# Patient Record
Sex: Female | Born: 1955
Health system: Southern US, Community
[De-identification: ages and names within clinical notes are randomized; demographics above are authoritative.]

## PROBLEM LIST (undated history)

## (undated) DIAGNOSIS — E119 Type 2 diabetes mellitus without complications: Secondary | ICD-10-CM

## (undated) DIAGNOSIS — L97512 Non-pressure chronic ulcer of other part of right foot with fat layer exposed: Secondary | ICD-10-CM

## (undated) DIAGNOSIS — G709 Myoneural disorder, unspecified: Secondary | ICD-10-CM

## (undated) DIAGNOSIS — Z8489 Family history of other specified conditions: Secondary | ICD-10-CM

## (undated) DIAGNOSIS — M19041 Primary osteoarthritis, right hand: Secondary | ICD-10-CM

## (undated) DIAGNOSIS — I209 Angina pectoris, unspecified: Secondary | ICD-10-CM

## (undated) DIAGNOSIS — K219 Gastro-esophageal reflux disease without esophagitis: Secondary | ICD-10-CM

## (undated) DIAGNOSIS — M19042 Primary osteoarthritis, left hand: Secondary | ICD-10-CM

## (undated) DIAGNOSIS — I739 Peripheral vascular disease, unspecified: Secondary | ICD-10-CM

## (undated) DIAGNOSIS — M1990 Unspecified osteoarthritis, unspecified site: Secondary | ICD-10-CM

## (undated) DIAGNOSIS — Z9289 Personal history of other medical treatment: Secondary | ICD-10-CM

## (undated) DIAGNOSIS — M869 Osteomyelitis, unspecified: Secondary | ICD-10-CM

## (undated) DIAGNOSIS — Z7902 Long term (current) use of antithrombotics/antiplatelets: Secondary | ICD-10-CM

## (undated) DIAGNOSIS — G4730 Sleep apnea, unspecified: Secondary | ICD-10-CM

## (undated) DIAGNOSIS — I10 Essential (primary) hypertension: Secondary | ICD-10-CM

## (undated) DIAGNOSIS — G629 Polyneuropathy, unspecified: Secondary | ICD-10-CM

## (undated) DIAGNOSIS — T7840XA Allergy, unspecified, initial encounter: Secondary | ICD-10-CM

## (undated) DIAGNOSIS — E7800 Pure hypercholesterolemia, unspecified: Secondary | ICD-10-CM

## (undated) DIAGNOSIS — H269 Unspecified cataract: Secondary | ICD-10-CM

## (undated) DIAGNOSIS — E876 Hypokalemia: Secondary | ICD-10-CM

## (undated) DIAGNOSIS — J45909 Unspecified asthma, uncomplicated: Secondary | ICD-10-CM

## (undated) HISTORY — DX: Unspecified cataract: H26.9

## (undated) HISTORY — PX: NASAL SINUS SURGERY: SHX719

## (undated) HISTORY — PX: UTERINE FIBROID SURGERY: SHX826

## (undated) HISTORY — PX: ABDOMINAL HYSTERECTOMY: SHX81

## (undated) HISTORY — DX: Allergy, unspecified, initial encounter: T78.40XA

## (undated) HISTORY — DX: Personal history of other medical treatment: Z92.89

## (undated) HISTORY — DX: Peripheral vascular disease, unspecified: I73.9

## (undated) HISTORY — DX: Myoneural disorder, unspecified: G70.9

---

## 1984-07-02 HISTORY — PX: BACK SURGERY: SHX140

## 1999-12-29 DIAGNOSIS — I10 Essential (primary) hypertension: Secondary | ICD-10-CM | POA: Insufficient documentation

## 2000-01-23 ENCOUNTER — Other Ambulatory Visit: Admission: RE | Admit: 2000-01-23 | Discharge: 2000-01-23 | Payer: Self-pay | Admitting: Obstetrics and Gynecology

## 2000-03-13 ENCOUNTER — Encounter: Payer: Self-pay | Admitting: Obstetrics and Gynecology

## 2000-03-13 ENCOUNTER — Ambulatory Visit (HOSPITAL_COMMUNITY): Admission: RE | Admit: 2000-03-13 | Discharge: 2000-03-13 | Payer: Self-pay | Admitting: Obstetrics and Gynecology

## 2000-04-16 ENCOUNTER — Encounter: Payer: Self-pay | Admitting: Obstetrics and Gynecology

## 2000-04-22 ENCOUNTER — Inpatient Hospital Stay (HOSPITAL_COMMUNITY): Admission: RE | Admit: 2000-04-22 | Discharge: 2000-04-25 | Payer: Self-pay | Admitting: Obstetrics and Gynecology

## 2000-05-10 ENCOUNTER — Other Ambulatory Visit: Admission: RE | Admit: 2000-05-10 | Discharge: 2000-05-10 | Payer: Self-pay | Admitting: Obstetrics and Gynecology

## 2005-05-15 ENCOUNTER — Ambulatory Visit: Payer: Self-pay | Admitting: Family Medicine

## 2005-08-07 ENCOUNTER — Ambulatory Visit: Payer: Self-pay | Admitting: Unknown Physician Specialty

## 2006-10-08 DIAGNOSIS — K449 Diaphragmatic hernia without obstruction or gangrene: Secondary | ICD-10-CM | POA: Insufficient documentation

## 2006-10-08 DIAGNOSIS — E119 Type 2 diabetes mellitus without complications: Secondary | ICD-10-CM | POA: Insufficient documentation

## 2006-10-08 DIAGNOSIS — J302 Other seasonal allergic rhinitis: Secondary | ICD-10-CM | POA: Insufficient documentation

## 2006-10-31 ENCOUNTER — Ambulatory Visit: Payer: Self-pay | Admitting: Family Medicine

## 2007-12-26 DIAGNOSIS — G4733 Obstructive sleep apnea (adult) (pediatric): Secondary | ICD-10-CM | POA: Insufficient documentation

## 2008-03-01 DIAGNOSIS — Z803 Family history of malignant neoplasm of breast: Secondary | ICD-10-CM | POA: Insufficient documentation

## 2008-04-27 ENCOUNTER — Ambulatory Visit: Payer: Self-pay | Admitting: Family Medicine

## 2008-08-28 ENCOUNTER — Observation Stay: Payer: Self-pay | Admitting: Internal Medicine

## 2008-10-13 ENCOUNTER — Ambulatory Visit: Payer: Self-pay | Admitting: Gastroenterology

## 2008-11-10 ENCOUNTER — Ambulatory Visit: Payer: Self-pay | Admitting: Family Medicine

## 2009-12-07 ENCOUNTER — Ambulatory Visit: Payer: Self-pay | Admitting: Family Medicine

## 2011-02-13 ENCOUNTER — Ambulatory Visit: Payer: Self-pay | Admitting: Family Medicine

## 2011-06-07 ENCOUNTER — Ambulatory Visit: Payer: Self-pay | Admitting: Family Medicine

## 2012-03-18 ENCOUNTER — Ambulatory Visit: Payer: Self-pay | Admitting: Family Medicine

## 2013-04-20 ENCOUNTER — Ambulatory Visit: Payer: Self-pay | Admitting: Family Medicine

## 2013-12-18 ENCOUNTER — Ambulatory Visit: Payer: Self-pay | Admitting: Neurosurgery

## 2013-12-24 DIAGNOSIS — G629 Polyneuropathy, unspecified: Secondary | ICD-10-CM | POA: Insufficient documentation

## 2013-12-24 DIAGNOSIS — B351 Tinea unguium: Secondary | ICD-10-CM | POA: Insufficient documentation

## 2013-12-24 DIAGNOSIS — I200 Unstable angina: Secondary | ICD-10-CM | POA: Insufficient documentation

## 2013-12-24 DIAGNOSIS — M898X9 Other specified disorders of bone, unspecified site: Secondary | ICD-10-CM | POA: Insufficient documentation

## 2013-12-28 ENCOUNTER — Other Ambulatory Visit: Payer: Self-pay | Admitting: Neurosurgery

## 2013-12-28 DIAGNOSIS — M4310 Spondylolisthesis, site unspecified: Secondary | ICD-10-CM

## 2014-07-15 LAB — HM PAP SMEAR: HM Pap smear: NEGATIVE

## 2014-09-08 ENCOUNTER — Ambulatory Visit: Payer: Self-pay | Admitting: Family Medicine

## 2014-09-14 ENCOUNTER — Ambulatory Visit: Payer: Self-pay | Admitting: Family Medicine

## 2014-12-13 ENCOUNTER — Other Ambulatory Visit: Payer: Self-pay | Admitting: Family Medicine

## 2014-12-13 DIAGNOSIS — J45909 Unspecified asthma, uncomplicated: Secondary | ICD-10-CM | POA: Insufficient documentation

## 2014-12-13 DIAGNOSIS — E559 Vitamin D deficiency, unspecified: Secondary | ICD-10-CM | POA: Insufficient documentation

## 2014-12-13 DIAGNOSIS — E785 Hyperlipidemia, unspecified: Secondary | ICD-10-CM | POA: Insufficient documentation

## 2014-12-13 DIAGNOSIS — E7800 Pure hypercholesterolemia, unspecified: Secondary | ICD-10-CM | POA: Insufficient documentation

## 2014-12-13 DIAGNOSIS — K219 Gastro-esophageal reflux disease without esophagitis: Secondary | ICD-10-CM | POA: Insufficient documentation

## 2014-12-13 DIAGNOSIS — E668 Other obesity: Secondary | ICD-10-CM | POA: Insufficient documentation

## 2014-12-13 DIAGNOSIS — E876 Hypokalemia: Secondary | ICD-10-CM | POA: Insufficient documentation

## 2014-12-13 DIAGNOSIS — E1169 Type 2 diabetes mellitus with other specified complication: Secondary | ICD-10-CM | POA: Insufficient documentation

## 2014-12-13 DIAGNOSIS — R252 Cramp and spasm: Secondary | ICD-10-CM | POA: Insufficient documentation

## 2014-12-13 NOTE — Telephone Encounter (Signed)
Ok to leave samples. Also, we probably should abstract this note.  Thanks.

## 2014-12-13 NOTE — Telephone Encounter (Signed)
#  28 samples Januvia placed up front for pick up, no Nexium in sample closet. Pt advised. Chart Abstracted. Renaldo Fiddler, CMA

## 2014-12-13 NOTE — Telephone Encounter (Signed)
Pt is requesting samples of Samples of Januvia 100mg  and Nexium 40mg  if possible.  CB#[phone] or [phone]

## 2015-01-24 ENCOUNTER — Other Ambulatory Visit: Payer: Self-pay | Admitting: Family Medicine

## 2015-01-24 DIAGNOSIS — I10 Essential (primary) hypertension: Secondary | ICD-10-CM

## 2015-01-24 NOTE — Telephone Encounter (Signed)
Last OV 10/2014 

## 2015-03-11 ENCOUNTER — Other Ambulatory Visit: Payer: Self-pay | Admitting: Family Medicine

## 2015-03-11 DIAGNOSIS — E119 Type 2 diabetes mellitus without complications: Secondary | ICD-10-CM

## 2015-03-11 MED ORDER — SITAGLIPTIN PHOSPHATE 100 MG PO TABS: 100.0000 mg | ORAL_TABLET | Freq: Every day | ORAL | Status: DC

## 2015-03-11 NOTE — Telephone Encounter (Signed)
Pt needs refill on her [place] .  [facility] is her pharmacy.  Call back is [phone]  [facility]

## 2015-06-23 ENCOUNTER — Other Ambulatory Visit: Payer: Self-pay | Admitting: Family Medicine

## 2015-06-23 DIAGNOSIS — J302 Other seasonal allergic rhinitis: Secondary | ICD-10-CM

## 2015-07-25 ENCOUNTER — Other Ambulatory Visit: Payer: Self-pay | Admitting: Family Medicine

## 2015-07-25 DIAGNOSIS — K219 Gastro-esophageal reflux disease without esophagitis: Secondary | ICD-10-CM

## 2015-07-25 DIAGNOSIS — I10 Essential (primary) hypertension: Secondary | ICD-10-CM

## 2015-07-25 NOTE — Telephone Encounter (Signed)
Last OV 10/2014  Thanks,   -Lorice Lafave  

## 2015-07-25 NOTE — Telephone Encounter (Signed)
Patient's brand name Nexium is costing too much even after the approval through insurance. [facility] in [place] is wanting to know is it okay to change to the generic Nexium. Please call [facility] in [place] to advise. [facility] was checking with the patient to make sure they would be okay with the change. Patient is out of medication. Call patient if you have any questions. [phone]

## 2015-07-26 NOTE — Telephone Encounter (Signed)
Ok to change. Thanks

## 2015-07-26 NOTE — Telephone Encounter (Signed)
Called [facility] and advised that it is okay to change nexium to generic. sd

## 2015-08-18 ENCOUNTER — Other Ambulatory Visit: Payer: Self-pay | Admitting: Family Medicine

## 2015-08-18 DIAGNOSIS — E7800 Pure hypercholesterolemia, unspecified: Secondary | ICD-10-CM

## 2015-08-24 ENCOUNTER — Telehealth: Payer: Self-pay | Admitting: Family Medicine

## 2015-08-24 MED ORDER — PANTOPRAZOLE SODIUM 40 MG PO TBEC: 40.0000 mg | DELAYED_RELEASE_TABLET | Freq: Every day | ORAL | Status: DC

## 2015-08-24 NOTE — Telephone Encounter (Signed)
Pt states she will need a new Rx to replace the NEXIUM 40 MG and Esomeprazole 40mg .  Pt states he insurance will not cover either of these.  [facility] [place].  [id number]

## 2015-08-24 NOTE — Telephone Encounter (Signed)
Sent new Rx. Thanks.

## 2015-08-24 NOTE — Telephone Encounter (Signed)
Pt advised.   Thanks,   -Pierre Cumpton  

## 2015-09-10 ENCOUNTER — Other Ambulatory Visit: Payer: Self-pay | Admitting: Family Medicine

## 2015-09-10 DIAGNOSIS — E119 Type 2 diabetes mellitus without complications: Secondary | ICD-10-CM

## 2015-09-12 DIAGNOSIS — E119 Type 2 diabetes mellitus without complications: Secondary | ICD-10-CM | POA: Insufficient documentation

## 2015-09-12 NOTE — Telephone Encounter (Signed)
10/22/14 last ov

## 2015-10-26 ENCOUNTER — Other Ambulatory Visit: Payer: Self-pay | Admitting: Physician Assistant

## 2015-10-26 ENCOUNTER — Other Ambulatory Visit: Payer: Self-pay

## 2015-10-26 DIAGNOSIS — I10 Essential (primary) hypertension: Secondary | ICD-10-CM

## 2015-10-26 MED ORDER — LOSARTAN POTASSIUM-HCTZ 100-25 MG PO TABS: 1.0000 | ORAL_TABLET | Freq: Every day | ORAL | Status: DC

## 2015-10-28 ENCOUNTER — Ambulatory Visit (INDEPENDENT_AMBULATORY_CARE_PROVIDER_SITE_OTHER): Payer: BC Managed Care – PPO | Admitting: Family Medicine

## 2015-10-28 ENCOUNTER — Encounter: Payer: Self-pay | Admitting: Family Medicine

## 2015-10-28 VITALS — BP 126/86 | HR 80 | Temp 98.0°F | Resp 16 | Ht 69.0 in | Wt 274.0 lb

## 2015-10-28 DIAGNOSIS — Z1239 Encounter for other screening for malignant neoplasm of breast: Secondary | ICD-10-CM

## 2015-10-28 DIAGNOSIS — E7800 Pure hypercholesterolemia, unspecified: Secondary | ICD-10-CM

## 2015-10-28 DIAGNOSIS — Z0000 Encounter for general adult medical examination without abnormal findings: Secondary | ICD-10-CM | POA: Diagnosis not present

## 2015-10-28 DIAGNOSIS — J302 Other seasonal allergic rhinitis: Secondary | ICD-10-CM

## 2015-10-28 DIAGNOSIS — Z1211 Encounter for screening for malignant neoplasm of colon: Secondary | ICD-10-CM

## 2015-10-28 DIAGNOSIS — I10 Essential (primary) hypertension: Secondary | ICD-10-CM | POA: Diagnosis not present

## 2015-10-28 DIAGNOSIS — E119 Type 2 diabetes mellitus without complications: Secondary | ICD-10-CM

## 2015-10-28 DIAGNOSIS — E559 Vitamin D deficiency, unspecified: Secondary | ICD-10-CM

## 2015-10-28 LAB — POCT URINALYSIS DIPSTICK
Bilirubin, UA: NEGATIVE
Blood, UA: NEGATIVE
Glucose, UA: NEGATIVE
Ketones, UA: NEGATIVE
Leukocytes, UA: NEGATIVE
Nitrite, UA: NEGATIVE
Protein, UA: NEGATIVE
Spec Grav, UA: 1.015
Urobilinogen, UA: 0.2
pH, UA: 6

## 2015-10-28 MED ORDER — MONTELUKAST SODIUM 10 MG PO TABS: 10.0000 mg | ORAL_TABLET | Freq: Every day | ORAL | Status: DC

## 2015-10-28 NOTE — Progress Notes (Signed)
Patient ID: [name], female   DOB: September 23, 1955, 60 y.o.   MRN: [id number]       Patient: Karen Stephenson, Female    DOB: [birth year], 19 y.o.   MRN: [id number] Visit Date: 10/28/2015  Today's Provider: Margarita Rana, MD   Chief Complaint  Patient presents with  . Annual Exam  . Diabetes   Subjective:    Annual physical exam Karen Stephenson is a 60 y.o. female who presents today for health maintenance and complete physical. She feels well. She reports exercising active with daily activites. She reports she is sleeping well.  07/15/14 CPE 07/15/14 Pap-neg; HPV-neg 09/14/14 Mammogram-BI-RADS 2 08/07/05 Colonoscopy-int hemorrhoids, polyp, recheck in 10 years.  -----------------------------------------------------------------  Diabetes Mellitus Type II, Follow-up:   No results found for: HGBA1C  Last seen for diabetes 1 years ago.  Management since then includes no changes. She reports excellent compliance with treatment. She is not having side effects. Current symptoms include none and have been stable. Home blood sugar records: fasting range: 130-140  Episodes of hypoglycemia? no   Most Recent Eye Exam:  Weight trend: stable Prior visit with dietician: no Current diet: in general, a "healthy" diet   Current exercise: gardening  Pertinent Labs: No results found for: CHOL, TRIG, HDL, LDLCALC, CREATININE  Wt Readings from Last 3 Encounters:  10/28/15 274 lb (124.286 kg)  10/22/14 274 lb (124.286 kg)    ------------------------------------------------------------------------     Review of Systems  Constitutional: Negative.   HENT: Positive for rhinorrhea and sinus pressure.   Eyes: Negative.   Respiratory: Negative.   Cardiovascular: Negative.   Gastrointestinal: Positive for constipation.  Endocrine: Negative.   Genitourinary: Negative.   Musculoskeletal: Positive for arthralgias.  Skin: Negative.   Allergic/Immunologic: Positive for environmental  allergies and food allergies.  Neurological: Positive for numbness.  Hematological: Negative.   Psychiatric/Behavioral: Negative.     Social History      She  reports that she quit smoking about 2 years ago. Her smoking use included Cigarettes. She has never used smokeless tobacco. She reports that she drinks alcohol. She reports that she does not use illicit drugs.       Social History   Social History  . Marital Status: Married    Spouse Name: N/A  . Number of Children: N/A  . Years of Education: N/A   Social History Main Topics  . Smoking status: Former Smoker    Types: Cigarettes    Quit date: 07/01/2013  . Smokeless tobacco: Never Used  . Alcohol Use: Yes     Comment: Occasional  . Drug Use: No  . Sexual Activity: Not Asked   Other Topics Concern  . None   Social History Narrative    History reviewed. No pertinent past medical history.   Patient Active Problem List   Diagnosis Date Noted  . Diabetes mellitus without complication ([place]) XX123456  . Airway hyperreactivity 12/13/2014  . Acid reflux 12/13/2014  . Hypercholesteremia 12/13/2014  . Decreased potassium in the blood 12/13/2014  . Cramps of lower extremity 12/13/2014  . Extreme obesity ([place]) 12/13/2014  . Avitaminosis D 12/13/2014  . Dermatophytic onychia 12/24/2013  . Bony exostosis 12/24/2013  . Unstable angina pectoris ([place]) 12/24/2013  . Polyneuropathy ([place]) 12/24/2013  . Family history of breast cancer 03/01/2008  . Obstructive apnea 12/26/2007  . Diaphragmatic hernia 10/08/2006  . Allergic rhinitis, seasonal 10/08/2006  . Diabetes mellitus, type 2 ([place]) 10/08/2006  . Essential (primary) hypertension 12/29/1999  Past Surgical History  Procedure Laterality Date  . Back surgery  1986  . Nasal sinus surgery    . Uterine fibroid surgery      Family History        Family Status  Relation Status Death Age  . Mother Deceased   . Father Deceased 18  . Sister Alive         Her family  history includes Breast cancer in her mother and sister; Diabetes in her mother; Heart disease in her father and paternal grandfather.    Allergies  Allergen Reactions  . Amoxicillin-Pot Clavulanate     no further information given.  . Erythromycin     Other reaction(s): Stomach Ache Stomach pain.  . Glipizide   . Metformin Hcl     Previous Medications   ALBUTEROL (PROVENTIL HFA) 108 (90 BASE) MCG/ACT INHALER    Inhale 1-2 puffs into the lungs as needed.    BIOTIN PO    Take 1 tablet by mouth daily.   CETIRIZINE HCL (ZYRTEC ALLERGY) 10 MG CAPS    Take 1 capsule by mouth daily.    FLUTICASONE (FLONASE) 50 MCG/ACT NASAL SPRAY    USE TWO SPRAY(S) IN EACH NOSTRIL ONCE DAILY   FLUTICASONE-SALMETEROL (ADVAIR DISKUS) 250-50 MCG/DOSE AEPB    Inhale 1 puff into the lungs as needed.    HOMEOPATHIC PRODUCTS (LEG CRAMP RELIEF PO)    Take 2 capsules by mouth daily.    HYOSCYAMINE (LEVBID) 0.375 MG 12 HR TABLET    Take 0.375 mg by mouth as needed.    JANUVIA 100 MG TABLET    TAKE ONE TABLET BY MOUTH ONCE DAILY   LOSARTAN-HYDROCHLOROTHIAZIDE (HYZAAR) 100-25 MG TABLET    Take 1 tablet by mouth daily.   LOVASTATIN (MEVACOR) 10 MG TABLET    TAKE ONE TABLET BY MOUTH IN THE EVENING   PANTOPRAZOLE (PROTONIX) 40 MG TABLET    Take 1 tablet (40 mg total) by mouth daily.   VITAMIN B-12 (CYANOCOBALAMIN) 1000 MCG TABLET    Take 1,000 mcg by mouth daily.    Patient Care Team: Margarita Rana, MD as PCP - General (Family Medicine)     Objective:   Vitals: BP 126/86 mmHg  Pulse 80  Temp(Src) 98 F (36.7 C) (Oral)  Resp 16  Ht 5\' 9"  (1.753 m)  Wt 274 lb (124.286 kg)  BMI 40.44 kg/m2   Physical Exam  Constitutional: She is oriented to person, place, and time. She appears well-developed and well-nourished.  HENT:  Head: Normocephalic and atraumatic.  Right Ear: Tympanic membrane, external ear and ear canal normal.  Left Ear: Tympanic membrane, external ear and ear canal normal.  Nose: Mucosal edema and  rhinorrhea present.  Mouth/Throat: Uvula is midline, oropharynx is clear and moist and mucous membranes are normal.  Eyes: Conjunctivae, EOM and lids are normal. Pupils are equal, round, and reactive to light.  Neck: Trachea normal and normal range of motion. Neck supple. Carotid bruit is not present. No thyroid mass and no thyromegaly present.  Cardiovascular: Normal rate, regular rhythm and normal heart sounds.   Pulmonary/Chest: Effort normal and breath sounds normal.  Abdominal: Soft. Normal appearance and bowel sounds are normal. There is no hepatosplenomegaly. There is no tenderness.  Genitourinary: No breast swelling, tenderness or discharge.  Musculoskeletal: Normal range of motion. She exhibits edema.  Lymphadenopathy:    She has no cervical adenopathy.    She has no axillary adenopathy.  Neurological: She is alert and oriented to  person, place, and time. She has normal strength. No cranial nerve deficit.  Skin: Skin is warm, dry and intact.  Psychiatric: She has a normal mood and affect. Her speech is normal and behavior is normal. Judgment and thought content normal. Cognition and memory are normal.     Depression Screen PHQ 2/9 Scores 10/28/2015  PHQ - 2 Score 0     Assessment & Plan:     Routine Health Maintenance and Physical Exam  Exercise Activities and Dietary recommendations Goals    None      Immunization History  Administered Date(s) Administered  . Pneumococcal Polysaccharide-23 02/29/2012  . Td 08/17/2003  . Tdap 04/18/2011       1. Annual physical exam Stable. Patient advised to continue eating healthy and exercise daily. - POCT urinalysis dipstick  2. Diabetes mellitus without complication (HCC) Stable. Patient advised to continue current medication and plan of care. - Hemoglobin A1c  3. Breast cancer screening - MM DIGITAL SCREENING BILATERAL; Future  4. Colon cancer screening - Ambulatory referral to Gastroenterology  5. Essential  (primary) hypertension - CBC with Differential/Platelet - Comprehensive metabolic panel  6. Avitaminosis D - VITAMIN D 25 Hydroxy (Vit-D Deficiency, Fractures)  7. Hypercholesteremia - Lipid Panel With LDL/HDL Ratio - TSH   Patient seen and examined by Dr. Jerrell Belfast, and note scribed by Philbert Riser. Dimas, CMA.  I have reviewed the document for accuracy and completeness and I agree with above. Jerrell Belfast, MD   Margarita Rana, MD

## 2015-10-29 LAB — COMPREHENSIVE METABOLIC PANEL
ALT: 23 IU/L (ref 0–32)
AST: 24 IU/L (ref 0–40)
Albumin/Globulin Ratio: 1.1 — ABNORMAL LOW (ref 1.2–2.2)
Albumin: 4 g/dL (ref 3.6–4.8)
Alkaline Phosphatase: 72 IU/L (ref 39–117)
BUN/Creatinine Ratio: 15 (ref 12–28)
BUN: 12 mg/dL (ref 8–27)
Bilirubin Total: 0.5 mg/dL (ref 0.0–1.2)
CO2: 24 mmol/L (ref 18–29)
Calcium: 9.3 mg/dL (ref 8.7–10.3)
Chloride: 98 mmol/L (ref 96–106)
Creatinine, Ser: 0.82 mg/dL (ref 0.57–1.00)
GFR calc Af Amer: 89 mL/min/{1.73_m2} (ref >59)
GFR calc non Af Amer: 77 mL/min/{1.73_m2} (ref >59)
Globulin, Total: 3.6 g/dL (ref 1.5–4.5)
Glucose: 187 mg/dL — ABNORMAL HIGH (ref 65–99)
Potassium: 4 mmol/L (ref 3.5–5.2)
Sodium: 138 mmol/L (ref 134–144)
Total Protein: 7.6 g/dL (ref 6.0–8.5)

## 2015-10-29 LAB — CBC WITH DIFFERENTIAL/PLATELET
Basophils Absolute: 0 10*3/uL (ref 0.0–0.2)
Basos: 0 %
EOS (ABSOLUTE): 0.2 10*3/uL (ref 0.0–0.4)
Eos: 3 %
Hematocrit: 39.2 % (ref 34.0–46.6)
Hemoglobin: 12.8 g/dL (ref 11.1–15.9)
Immature Grans (Abs): 0 10*3/uL (ref 0.0–0.1)
Immature Granulocytes: 0 %
Lymphocytes Absolute: 1.9 10*3/uL (ref 0.7–3.1)
Lymphs: 26 %
MCH: 27.8 pg (ref 26.6–33.0)
MCHC: 32.7 g/dL (ref 31.5–35.7)
MCV: 85 fL (ref 79–97)
Monocytes Absolute: 0.4 10*3/uL (ref 0.1–0.9)
Monocytes: 6 %
Neutrophils Absolute: 4.6 10*3/uL (ref 1.4–7.0)
Neutrophils: 65 %
Platelets: 315 10*3/uL (ref 150–379)
RBC: 4.61 x10E6/uL (ref 3.77–5.28)
RDW: 13.6 % (ref 12.3–15.4)
WBC: 7.1 10*3/uL (ref 3.4–10.8)

## 2015-10-29 LAB — HEMOGLOBIN A1C
Est. average glucose Bld gHb Est-mCnc: 229 mg/dL
Hgb A1c MFr Bld: 9.6 % — ABNORMAL HIGH (ref 4.8–5.6)

## 2015-10-29 LAB — LIPID PANEL WITH LDL/HDL RATIO
Cholesterol, Total: 183 mg/dL (ref 100–199)
HDL: 36 mg/dL — ABNORMAL LOW (ref >39)
LDL Calculated: 115 mg/dL — ABNORMAL HIGH (ref 0–99)
LDl/HDL Ratio: 3.2 ratio units (ref 0.0–3.2)
Triglycerides: 158 mg/dL — ABNORMAL HIGH (ref 0–149)
VLDL Cholesterol Cal: 32 mg/dL (ref 5–40)

## 2015-10-29 LAB — VITAMIN D 25 HYDROXY (VIT D DEFICIENCY, FRACTURES): Vit D, 25-Hydroxy: 17.4 ng/mL — ABNORMAL LOW (ref 30.0–100.0)

## 2015-10-29 LAB — TSH: TSH: 2.24 u[IU]/mL (ref 0.450–4.500)

## 2015-10-31 ENCOUNTER — Telehealth: Payer: Self-pay

## 2015-10-31 NOTE — Telephone Encounter (Signed)
OV scheduled for May 10 at 11:00 am to discuss options. Renaldo Fiddler, CMA

## 2015-10-31 NOTE — Telephone Encounter (Signed)
-----   Message from Margarita Rana, MD sent at 10/29/2015  1:49 PM EDT ----- Vitamin D very low and Blood sugar much higher than anticipated. Recommend follow up ov to address treatment options or endocrinology referral. Blood sugar average is over 200. Thanks.

## 2015-11-09 ENCOUNTER — Ambulatory Visit (INDEPENDENT_AMBULATORY_CARE_PROVIDER_SITE_OTHER): Payer: BC Managed Care – PPO | Admitting: Family Medicine

## 2015-11-09 ENCOUNTER — Encounter: Payer: Self-pay | Admitting: Family Medicine

## 2015-11-09 ENCOUNTER — Other Ambulatory Visit: Payer: Self-pay | Admitting: Family Medicine

## 2015-11-09 VITALS — BP 140/86 | HR 80 | Temp 98.3°F | Resp 16 | Wt 275.0 lb

## 2015-11-09 DIAGNOSIS — E119 Type 2 diabetes mellitus without complications: Secondary | ICD-10-CM

## 2015-11-09 LAB — POCT UA - MICROALBUMIN: Microalbumin Ur, POC: 20 mg/L

## 2015-11-09 MED ORDER — ACARBOSE 25 MG PO TABS: 25.0000 mg | ORAL_TABLET | Freq: Three times a day (TID) | ORAL | Status: DC

## 2015-11-09 MED ORDER — CANAGLIFLOZIN 100 MG PO TABS: 100.0000 mg | ORAL_TABLET | Freq: Every day | ORAL | Status: DC

## 2015-11-09 NOTE — Progress Notes (Signed)
Patient ID: Karen Stephenson, female   DOB: 21-Aug-1955, 60 y.o.   MRN: [id number]       Patient: Karen Stephenson Female    DOB: [birth year]   45 y.o.   MRN: [id number] Visit Date: 11/09/2015  Today's Provider: Margarita Rana, MD   Chief Complaint  Patient presents with  . Diabetes   Subjective:    HPI  Diabetes Mellitus Type II, Follow-up:   Lab Results  Component Value Date   HGBA1C 9.6* 10/28/2015    Last seen for diabetes 12 days ago.  Management since then includes checking labs. She reports excellent compliance with treatment. She is not having side effects.  Current symptoms include paresthesia of the feet and have been stable. Home blood sugar records: not being tested  Episodes of hypoglycemia? no   Current Insulin Regimen: none Most Recent Eye Exam: over one year Weight trend: stable Prior visit with dietician: no Current diet: in general, a "healthy" diet   Current exercise: none  Pertinent Labs:    Component Value Date/Time   CHOL 183 10/28/2015 1117   TRIG 158* 10/28/2015 1117   HDL 36* 10/28/2015 1117   LDLCALC 115* 10/28/2015 1117   CREATININE 0.82 10/28/2015 1117    Wt Readings from Last 3 Encounters:  11/09/15 275 lb (124.739 kg)  10/28/15 274 lb (124.286 kg)  10/22/14 274 lb (124.286 kg)   ------------------------------------------------------------------------     Allergies  Allergen Reactions  . Amoxicillin-Pot Clavulanate     no further information given.  . Erythromycin     Other reaction(s): Stomach Ache Stomach pain.  . Glipizide   . Metformin Hcl    Previous Medications   ALBUTEROL (PROVENTIL HFA) 108 (90 BASE) MCG/ACT INHALER    Inhale 1-2 puffs into the lungs as needed.    BIOTIN PO    Take 1 tablet by mouth daily.   CETIRIZINE HCL (ZYRTEC ALLERGY) 10 MG CAPS    Take 1 capsule by mouth daily.    FLUTICASONE (FLONASE) 50 MCG/ACT NASAL SPRAY    USE TWO SPRAY(S) IN EACH NOSTRIL ONCE DAILY   FLUTICASONE-SALMETEROL (ADVAIR  DISKUS) 250-50 MCG/DOSE AEPB    Inhale 1 puff into the lungs as needed.    HOMEOPATHIC PRODUCTS (LEG CRAMP RELIEF PO)    Take 2 capsules by mouth daily.    HYOSCYAMINE (LEVBID) 0.375 MG 12 HR TABLET    Take 0.375 mg by mouth as needed.    JANUVIA 100 MG TABLET    TAKE ONE TABLET BY MOUTH ONCE DAILY   LOSARTAN-HYDROCHLOROTHIAZIDE (HYZAAR) 100-25 MG TABLET    Take 1 tablet by mouth daily.   LOVASTATIN (MEVACOR) 10 MG TABLET    TAKE ONE TABLET BY MOUTH IN THE EVENING   MONTELUKAST (SINGULAIR) 10 MG TABLET    Take 1 tablet (10 mg total) by mouth at bedtime.   PANTOPRAZOLE (PROTONIX) 40 MG TABLET    Take 1 tablet (40 mg total) by mouth daily.   VITAMIN B-12 (CYANOCOBALAMIN) 1000 MCG TABLET    Take 1,000 mcg by mouth daily.    Review of Systems  Constitutional: Negative.   Respiratory: Negative.   Cardiovascular: Positive for leg swelling.  Neurological: Positive for numbness.    Social History  Substance Use Topics  . Smoking status: Former Smoker    Types: Cigarettes    Quit date: 07/01/2013  . Smokeless tobacco: Never Used  . Alcohol Use: Yes     Comment: Occasional   Objective:   BP 140/86  mmHg  Pulse 80  Temp(Src) 98.3 F (36.8 C) (Oral)  Resp 16  Wt 275 lb (124.739 kg)  Physical Exam  Constitutional: She is oriented to person, place, and time. She appears well-developed and well-nourished.  Cardiovascular: Normal rate and regular rhythm.   Pulmonary/Chest: Effort normal and breath sounds normal.  Neurological: She is alert and oriented to person, place, and time.  Psychiatric: She has a normal mood and affect. Her behavior is normal. Judgment and thought content normal.      Assessment & Plan:     1. Diabetes mellitus without complication (HCC) Worsening. Not at goal. Patient started on Precose, which is old generic drug.  Could not tolerate Metformin or Glipizide. Wanted to try generic first. Will consider Invokana and referral if needed.   Going to work more on  lifestyle also, including increased activity.   - POCT UA - Microalbumin - acarbose (PRECOSE) 25 MG tablet; Take 1 tablet (25 mg total) by mouth 3 (three) times daily with meals.  Dispense: 90 tablet; Refill: 3  Diabetic Foot Exam - Simple   Simple Foot Form  Diabetic Foot exam was performed with the following findings:  Yes 11/09/2015 11:37 AM  Visual Inspection  Sensation Testing  See comments:  Yes  Pulse Check  Posterior Tibialis and Dorsalis pulse intact bilaterally:  Yes  Comments  Decreased monofilament bilateral        Patient seen and examined by Dr. Jerrell Belfast, and note scribed by Philbert Riser. Dimas, CMA.   I have reviewed the document for accuracy and completeness and I agree with above. - Jerrell Belfast, MD   Margarita Rana, MD  [facility]

## 2015-12-15 ENCOUNTER — Encounter: Payer: Self-pay | Admitting: Family Medicine

## 2015-12-15 ENCOUNTER — Ambulatory Visit (INDEPENDENT_AMBULATORY_CARE_PROVIDER_SITE_OTHER): Payer: BC Managed Care – PPO | Admitting: Family Medicine

## 2015-12-15 VITALS — BP 132/74 | HR 88 | Temp 98.2°F | Resp 16 | Wt 274.0 lb

## 2015-12-15 DIAGNOSIS — B351 Tinea unguium: Secondary | ICD-10-CM

## 2015-12-15 MED ORDER — TERBINAFINE HCL 250 MG PO TABS: 250.0000 mg | ORAL_TABLET | Freq: Every day | ORAL | Status: DC

## 2015-12-15 NOTE — Progress Notes (Signed)
Subjective:    Patient ID: Karen Stephenson, female    DOB: 1956-02-02, 60 y.o.   MRN: [id number]  HPI  Toenail Problem Great toenail on right foot. Pt reports she lifted the toenail off of the nail bed yesterday. Reports the nail was "thick and hard" prior to this. Denies injury, pain. Has seen Dr. Elvina Mattes (podiatry) in the past for the thickening and hardening of toenail. Pt was put on Lamisil. Dr. Elvina Mattes informed pt that this was most likely due to stressing the toes while wearing shoes that were too tight. Pt now tries to wear toeless shoes as much as possible.  Review of Systems  Constitutional: Positive for fatigue. Negative for fever, chills, diaphoresis, activity change, appetite change and unexpected weight change.  Skin:       Nail problem present   BP 132/74 mmHg  Pulse 88  Temp(Src) 98.2 F (36.8 C) (Oral)  Resp 16  Wt 274 lb (124.286 kg)   Patient Active Problem List   Diagnosis Date Noted  . Diabetes mellitus without complication ([place]) XX123456  . Airway hyperreactivity 12/13/2014  . Acid reflux 12/13/2014  . Hypercholesteremia 12/13/2014  . Decreased potassium in the blood 12/13/2014  . Cramps of lower extremity 12/13/2014  . Extreme obesity ([place]) 12/13/2014  . Avitaminosis D 12/13/2014  . Dermatophytic onychia 12/24/2013  . Bony exostosis 12/24/2013  . Unstable angina pectoris ([place]) 12/24/2013  . Polyneuropathy ([place]) 12/24/2013  . Family history of breast cancer 03/01/2008  . Obstructive apnea 12/26/2007  . Diaphragmatic hernia 10/08/2006  . Allergic rhinitis, seasonal 10/08/2006  . Diabetes mellitus, type 2 ([place]) 10/08/2006  . Essential (primary) hypertension 12/29/1999   No past medical history on file. Current Outpatient Prescriptions on File Prior to Visit  Medication Sig  . acarbose (PRECOSE) 25 MG tablet Take 1 tablet (25 mg total) by mouth 3 (three) times daily with meals.  Marland Kitchen albuterol (PROVENTIL HFA) 108 (90 BASE) MCG/ACT inhaler Inhale 1-2  puffs into the lungs as needed.   Marland Kitchen BIOTIN PO Take 1 tablet by mouth daily.  . Cetirizine HCl (ZYRTEC ALLERGY) 10 MG CAPS Take 1 capsule by mouth daily.   . fluticasone (FLONASE) 50 MCG/ACT nasal spray USE TWO SPRAY(S) IN EACH NOSTRIL ONCE DAILY  . Fluticasone-Salmeterol (ADVAIR DISKUS) 250-50 MCG/DOSE AEPB Inhale 1 puff into the lungs as needed.   . Homeopathic Products (LEG CRAMP RELIEF PO) Take 2 capsules by mouth daily.   . hyoscyamine (LEVBID) 0.375 MG 12 hr tablet Take 0.375 mg by mouth as needed.   Marland Kitchen JANUVIA 100 MG tablet TAKE ONE TABLET BY MOUTH ONCE DAILY  . losartan-hydrochlorothiazide (HYZAAR) 100-25 MG tablet Take 1 tablet by mouth daily.  Marland Kitchen lovastatin (MEVACOR) 10 MG tablet TAKE ONE TABLET BY MOUTH IN THE EVENING  . montelukast (SINGULAIR) 10 MG tablet Take 1 tablet (10 mg total) by mouth at bedtime.  . pantoprazole (PROTONIX) 40 MG tablet Take 1 tablet (40 mg total) by mouth daily.  . vitamin B-12 (CYANOCOBALAMIN) 1000 MCG tablet Take 1,000 mcg by mouth daily.   No current facility-administered medications on file prior to visit.   Allergies  Allergen Reactions  . Amoxicillin-Pot Clavulanate     no further information given.  . Erythromycin     Other reaction(s): Stomach Ache Stomach pain.  . Glipizide   . Metformin Hcl    Past Surgical History  Procedure Laterality Date  . Back surgery  1986  . Nasal sinus surgery    . Uterine  fibroid surgery     Social History   Social History  . Marital Status: Married    Spouse Name: N/A  . Number of Children: N/A  . Years of Education: N/A   Occupational History  . Not on file.   Social History Main Topics  . Smoking status: Former Smoker    Types: Cigarettes    Quit date: 07/01/2013  . Smokeless tobacco: Never Used  . Alcohol Use: Yes     Comment: Occasional  . Drug Use: No  . Sexual Activity: Not on file   Other Topics Concern  . Not on file   Social History Narrative   Family History  Problem Relation  Age of Onset  . Diabetes Mother   . Breast cancer Mother   . Heart disease Father   . Breast cancer Sister   . Heart disease Paternal Grandfather        Objective:   Physical Exam  Constitutional: She appears well-developed and well-nourished.  Skin:  Right great toe is thickened and loose with black matter beneath. 5th digit toe also thickened.  Psychiatric: She has a normal mood and affect. Her behavior is normal.  BP 132/74 mmHg  Pulse 88  Temp(Src) 98.2 F (36.8 C) (Oral)  Resp 16  Wt 274 lb (124.286 kg)     Assessment & Plan:  1. Nail fungus Will treat with Lamisil as below. Recent liver function test 2 months ago were WNL. Pt will FU with Dr. Rosanna Randy in 6 weeks for recheck, and to check LFT's  - terbinafine (LAMISIL) 250 MG tablet; Take 1 tablet (250 mg total) by mouth daily.  Dispense: 30 tablet; Refill: 2    Patient seen and examined by Jerrell Belfast, MD, and note scribed by Renaldo Fiddler, CMA.  I have reviewed the document for accuracy and completeness and I agree with above. Jerrell Belfast, MD   Margarita Rana, MD

## 2015-12-20 ENCOUNTER — Encounter: Payer: Self-pay | Admitting: Family Medicine

## 2015-12-24 ENCOUNTER — Other Ambulatory Visit: Payer: Self-pay | Admitting: Family Medicine

## 2015-12-24 DIAGNOSIS — K219 Gastro-esophageal reflux disease without esophagitis: Secondary | ICD-10-CM

## 2016-01-07 ENCOUNTER — Other Ambulatory Visit: Payer: Self-pay | Admitting: Family Medicine

## 2016-01-07 DIAGNOSIS — E7800 Pure hypercholesterolemia, unspecified: Secondary | ICD-10-CM

## 2016-01-25 ENCOUNTER — Ambulatory Visit (INDEPENDENT_AMBULATORY_CARE_PROVIDER_SITE_OTHER): Payer: BC Managed Care – PPO | Admitting: Family Medicine

## 2016-01-25 ENCOUNTER — Other Ambulatory Visit: Payer: Self-pay | Admitting: Family Medicine

## 2016-01-25 VITALS — BP 128/72 | HR 92 | Temp 98.5°F | Resp 18 | Wt 273.0 lb

## 2016-01-25 DIAGNOSIS — E119 Type 2 diabetes mellitus without complications: Secondary | ICD-10-CM

## 2016-01-25 DIAGNOSIS — I10 Essential (primary) hypertension: Secondary | ICD-10-CM

## 2016-01-25 DIAGNOSIS — E559 Vitamin D deficiency, unspecified: Secondary | ICD-10-CM

## 2016-01-25 MED ORDER — VITAMIN D (CHOLECALCIFEROL) 25 MCG (1000 UT) PO CAPS: 1.0000 | ORAL_CAPSULE | Freq: Every day | ORAL | 0 refills | Status: DC

## 2016-01-25 MED ORDER — FLUTICASONE-SALMETEROL 250-50 MCG/DOSE IN AEPB: 1.0000 | INHALATION_SPRAY | RESPIRATORY_TRACT | 12 refills | Status: DC | PRN

## 2016-01-25 NOTE — Progress Notes (Signed)
Subjective:  HPI  Patient is here for follow up. She use to see Dr. Venia Minks. Last follow up visit was on may 10th.  Diabetes: She can not tolerate Glipizide and Metformin so she was started on Precose. She has not been checking her sugars. No side effects from the medication so far. Lab Results  Component Value Date   HGBA1C 9.6 (H) 10/28/2015   Hyperlipidemia: Lab Results  Component Value Date   CHOL 183 10/28/2015   HDL 36 (L) 10/28/2015   LDLCALC 115 (H) 10/28/2015   TRIG 158 (H) 10/28/2015   Weight: 273 lb (123.8 kg)  On her last labs in April Vitamin D level was very low and she does states noticing she is more fatigue.  BP Readings from Last 3 Encounters:  01/25/16 128/72  12/15/15 132/74  11/09/15 140/86   She has had URI for past several days.   Prior to Admission medications   Medication Sig Start Date End Date Taking? Authorizing Provider  acarbose (PRECOSE) 25 MG tablet Take 1 tablet (25 mg total) by mouth 3 (three) times daily with meals. 11/09/15  Yes Margarita Rana, MD  albuterol (PROVENTIL HFA) 108 (90 BASE) MCG/ACT inhaler Inhale 1-2 puffs into the lungs as needed.  12/24/11  Yes Historical Provider, MD  BIOTIN PO Take 1 tablet by mouth daily.   Yes Historical Provider, MD  Cetirizine HCl (ZYRTEC ALLERGY) 10 MG CAPS Take 1 capsule by mouth daily.  04/24/10  Yes Historical Provider, MD  fluticasone (FLONASE) 50 MCG/ACT nasal spray USE TWO SPRAY(S) IN EACH NOSTRIL ONCE DAILY 06/23/15  Yes Margarita Rana, MD  Fluticasone-Salmeterol (ADVAIR DISKUS) 250-50 MCG/DOSE AEPB Inhale 1 puff into the lungs as needed.  07/16/14  Yes Historical Provider, MD  Homeopathic Products (LEG CRAMP RELIEF PO) Take 2 capsules by mouth daily.    Yes Historical Provider, MD  hyoscyamine (LEVBID) 0.375 MG 12 hr tablet Take 0.375 mg by mouth as needed.  03/21/11  Yes Historical Provider, MD  JANUVIA 100 MG tablet TAKE ONE TABLET BY MOUTH ONCE DAILY 11/10/15  Yes Margarita Rana, MD    losartan-hydrochlorothiazide (HYZAAR) 100-25 MG tablet Take 1 tablet by mouth daily. 10/26/15  Yes Margarita Rana, MD  lovastatin (MEVACOR) 10 MG tablet TAKE ONE TABLET BY MOUTH IN THE EVENING 01/09/16  Yes Beula Joyner Maceo Pro., MD  montelukast (SINGULAIR) 10 MG tablet Take 1 tablet (10 mg total) by mouth at bedtime. 10/28/15  Yes Margarita Rana, MD  pantoprazole (PROTONIX) 40 MG tablet TAKE ONE TABLET BY MOUTH ONCE DAILY 12/26/15  Yes Margarita Rana, MD  terbinafine (LAMISIL) 250 MG tablet Take 1 tablet (250 mg total) by mouth daily. 12/15/15  Yes Margarita Rana, MD  vitamin B-12 (CYANOCOBALAMIN) 1000 MCG tablet Take 1,000 mcg by mouth daily.   Yes Historical Provider, MD    Patient Active Problem List   Diagnosis Date Noted  . Diabetes mellitus without complication ([place]) XX123456  . Airway hyperreactivity 12/13/2014  . Acid reflux 12/13/2014  . Hypercholesteremia 12/13/2014  . Decreased potassium in the blood 12/13/2014  . Cramps of lower extremity 12/13/2014  . Extreme obesity ([place]) 12/13/2014  . Avitaminosis D 12/13/2014  . Dermatophytic onychia 12/24/2013  . Bony exostosis 12/24/2013  . Unstable angina pectoris ([place]) 12/24/2013  . Polyneuropathy ([place]) 12/24/2013  . Family history of breast cancer 03/01/2008  . Obstructive apnea 12/26/2007  . Diaphragmatic hernia 10/08/2006  . Allergic rhinitis, seasonal 10/08/2006  . Diabetes mellitus, type 2 ([place]) 10/08/2006  .  Essential (primary) hypertension 12/29/1999    No past medical history on file.  Social History   Social History  . Marital status: Married    Spouse name: N/A  . Number of children: N/A  . Years of education: N/A   Occupational History  . Not on file.   Social History Main Topics  . Smoking status: Former Smoker    Types: Cigarettes    Quit date: 07/01/2013  . Smokeless tobacco: Never Used  . Alcohol use Yes     Comment: Occasional  . Drug use: No  . Sexual activity: Not on file   Other Topics Concern   . Not on file   Social History Narrative  . No narrative on file    Allergies  Allergen Reactions  . Amoxicillin-Pot Clavulanate     no further information given.  . Erythromycin     Other reaction(s): Stomach Ache Stomach pain.  . Glipizide   . Metformin Hcl     Review of Systems  Constitutional: Positive for malaise/fatigue.  Respiratory: Positive for shortness of breath (at times due to asthma).   Cardiovascular: Negative.   Gastrointestinal: Positive for heartburn (at times).  Musculoskeletal: Negative.   Psychiatric/Behavioral: Negative.     Immunization History  Administered Date(s) Administered  . Pneumococcal Polysaccharide-23 02/29/2012  . Td 08/17/2003  . Tdap 04/18/2011   Objective:  BP 128/72   Pulse 92   Temp 98.5 F (36.9 C)   Resp 18   Wt 273 lb (123.8 kg)   BMI 40.32 kg/m   Physical Exam  Constitutional: She is oriented to person, place, and time and well-developed, well-nourished, and in no distress.  HENT:  Head: Normocephalic and atraumatic.  Right Ear: External ear normal.  Left Ear: External ear normal.  Eyes: Conjunctivae are normal. Pupils are equal, round, and reactive to light.  Neck: Normal range of motion. Neck supple.  Cardiovascular: Normal rate, regular rhythm, normal heart sounds and intact distal pulses.   No murmur heard. Pulmonary/Chest: Effort normal and breath sounds normal.  Abdominal: She exhibits no distension. There is no tenderness.  Musculoskeletal: Normal range of motion. She exhibits no edema or tenderness.  Neurological: She is alert and oriented to person, place, and time.  Psychiatric: Mood, memory, affect and judgment normal.    Lab Results  Component Value Date   WBC 7.1 10/28/2015   HCT 39.2 10/28/2015   PLT 315 10/28/2015   GLUCOSE 187 (H) 10/28/2015   CHOL 183 10/28/2015   TRIG 158 (H) 10/28/2015   HDL 36 (L) 10/28/2015   LDLCALC 115 (H) 10/28/2015   TSH 2.240 10/28/2015   HGBA1C 9.6 (H)  10/28/2015    CMP     Component Value Date/Time   NA 138 10/28/2015 1117   K 4.0 10/28/2015 1117   CL 98 10/28/2015 1117   CO2 24 10/28/2015 1117   GLUCOSE 187 (H) 10/28/2015 1117   BUN 12 10/28/2015 1117   CREATININE 0.82 10/28/2015 1117   CALCIUM 9.3 10/28/2015 1117   PROT 7.6 10/28/2015 1117   ALBUMIN 4.0 10/28/2015 1117   AST 24 10/28/2015 1117   ALT 23 10/28/2015 1117   ALKPHOS 72 10/28/2015 1117   BILITOT 0.5 10/28/2015 1117   GFRNONAA 77 10/28/2015 1117   GFRAA 89 10/28/2015 1117    Assessment and Plan :  1. Diabetes mellitus without complication (HCC) - HgB A1c  2. Essential (primary) hypertension  3. Avitaminosis D Start OTC Vitamin D once daily. - Vitamin  D, Cholecalciferol, 1000 units CAPS; Take 1 capsule by mouth daily.  Dispense: 30 capsule; Refill: 0 4.URI Patient was seen and examined by Dr. Eulas Post and note was scribed by Theressa Millard, RMA.  I have done the exam and reviewed the above chart and it is accurate to the best of my knowledge.  Miguel Aschoff MD [facility] 01/25/2016 3:17 PM

## 2016-02-02 LAB — HEMOGLOBIN A1C
Est. average glucose Bld gHb Est-mCnc: 229 mg/dL
Hgb A1c MFr Bld: 9.6 % — ABNORMAL HIGH (ref 4.8–5.6)

## 2016-04-07 ENCOUNTER — Other Ambulatory Visit: Payer: Self-pay | Admitting: Family Medicine

## 2016-04-07 DIAGNOSIS — J302 Other seasonal allergic rhinitis: Secondary | ICD-10-CM

## 2016-04-09 NOTE — Telephone Encounter (Signed)
Has appointment with you 05/02/2016. Renaldo Fiddler, CMA

## 2016-04-25 ENCOUNTER — Other Ambulatory Visit: Payer: Self-pay | Admitting: Family Medicine

## 2016-04-25 DIAGNOSIS — E119 Type 2 diabetes mellitus without complications: Secondary | ICD-10-CM

## 2016-05-02 ENCOUNTER — Ambulatory Visit (INDEPENDENT_AMBULATORY_CARE_PROVIDER_SITE_OTHER): Payer: BC Managed Care – PPO | Admitting: Family Medicine

## 2016-05-02 VITALS — BP 136/72 | HR 80 | Temp 97.6°F | Resp 16 | Wt 276.0 lb

## 2016-05-02 DIAGNOSIS — I10 Essential (primary) hypertension: Secondary | ICD-10-CM

## 2016-05-02 DIAGNOSIS — E119 Type 2 diabetes mellitus without complications: Secondary | ICD-10-CM

## 2016-05-02 DIAGNOSIS — R0789 Other chest pain: Secondary | ICD-10-CM | POA: Diagnosis not present

## 2016-05-02 DIAGNOSIS — E559 Vitamin D deficiency, unspecified: Secondary | ICD-10-CM | POA: Diagnosis not present

## 2016-05-02 LAB — POCT GLYCOSYLATED HEMOGLOBIN (HGB A1C): Hemoglobin A1C: 9.3

## 2016-05-02 MED ORDER — EMPAGLIFLOZIN 10 MG PO TABS: 10.0000 mg | ORAL_TABLET | Freq: Every day | ORAL | 0 refills | Status: DC

## 2016-05-02 MED ORDER — GLUCOSE BLOOD VI STRP: ORAL_STRIP | 12 refills | Status: AC

## 2016-05-02 MED ORDER — VITAMIN D (ERGOCALCIFEROL) 1.25 MG (50000 UNIT) PO CAPS: 50000.0000 [IU] | ORAL_CAPSULE | ORAL | 12 refills | Status: DC

## 2016-05-02 NOTE — Progress Notes (Signed)
Karen Stephenson  MRN: [id number] DOB: [birth year]  Subjective:  HPI   Patient is here for 4 months follow up  Diabetes: checks sugar sometimes fatings-reading have been around 130-140s. She had a hypoglycemic episodes of sugar getting down at lowest 52 and she gets a "tingling itching sensation in her hands" at that time. Last time this happened was past Sunday October 29th and before that it was 3 to 4 months ago. Lab Results  Component Value Date   HGBA1C 9.6 (H) 02/01/2016    BP Readings from Last 3 Encounters:  05/02/16 136/72  01/25/16 128/72  12/15/15 132/74   Vitamin D: on last visit patient was advised to start Vitamin D OTC and she has been taking this.  Routine labs and vitamin D were checked on 10/28/15, Vitamin D level was 17.4  patient mentions that about 2 weeks ago she experienced a sharp with some tightness sensation in her chest radiating to upper left arm and shoulder that lasted for about 2 minutes. No other symptoms associated with it at that time and no issues since then. When this happened patient was on the go and was doing things when she felt it. Patient Active Problem List   Diagnosis Date Noted  . Diabetes mellitus without complication ([place]) XX123456  . Airway hyperreactivity 12/13/2014  . Acid reflux 12/13/2014  . Hypercholesteremia 12/13/2014  . Decreased potassium in the blood 12/13/2014  . Cramps of lower extremity 12/13/2014  . Extreme obesity ([place]) 12/13/2014  . Avitaminosis D 12/13/2014  . Dermatophytic onychia 12/24/2013  . Bony exostosis 12/24/2013  . Unstable angina pectoris ([place]) 12/24/2013  . Polyneuropathy ([place]) 12/24/2013  . Family history of breast cancer 03/01/2008  . Obstructive apnea 12/26/2007  . Diaphragmatic hernia 10/08/2006  . Allergic rhinitis, seasonal 10/08/2006  . Diabetes mellitus, type 2 ([place]) 10/08/2006  . Essential (primary) hypertension 12/29/1999    No past medical history on file.  Social History   Social  History  . Marital status: Married    Spouse name: N/A  . Number of children: N/A  . Years of education: N/A   Occupational History  . Not on file.   Social History Main Topics  . Smoking status: Former Smoker    Types: Cigarettes    Quit date: 07/01/2013  . Smokeless tobacco: Never Used  . Alcohol use Yes     Comment: Occasional  . Drug use: No  . Sexual activity: Not on file   Other Topics Concern  . Not on file   Social History Narrative  . No narrative on file    Outpatient Encounter Prescriptions as of 05/02/2016  Medication Sig Note  . acarbose (PRECOSE) 25 MG tablet TAKE ONE TABLET BY MOUTH THREE TIMES DAILY WITH MEALS   . albuterol (PROVENTIL HFA) 108 (90 BASE) MCG/ACT inhaler Inhale 1-2 puffs into the lungs as needed.  12/13/2014: Medication taken as needed.  Received from: [facility]  . BIOTIN PO Take 1 tablet by mouth daily.   . Cetirizine HCl (ZYRTEC ALLERGY) 10 MG CAPS Take 1 capsule by mouth daily.  12/13/2014: Received from: [facility]  . fluticasone (FLONASE) 50 MCG/ACT nasal spray USE TWO SPRAY(S) IN EACH NOSTRIL ONCE DAILY   . Fluticasone-Salmeterol (ADVAIR DISKUS) 250-50 MCG/DOSE AEPB Inhale 1 puff into the lungs as needed.   . Homeopathic Products (LEG CRAMP RELIEF PO) Take 2 capsules by mouth daily.  12/13/2014: Received from: [facility]  . hyoscyamine (LEVBID) 0.375 MG 12  hr tablet Take 0.375 mg by mouth as needed.  12/13/2014: Medication taken as needed.  Received from: [facility]  . JANUVIA 100 MG tablet TAKE ONE TABLET BY MOUTH ONCE DAILY   . losartan-hydrochlorothiazide (HYZAAR) 100-25 MG tablet Take 1 tablet by mouth daily.   Marland Kitchen lovastatin (MEVACOR) 10 MG tablet TAKE ONE TABLET BY MOUTH IN THE EVENING   . montelukast (SINGULAIR) 10 MG tablet TAKE ONE TABLET BY MOUTH AT BEDTIME   . pantoprazole (PROTONIX) 40 MG tablet TAKE ONE TABLET BY MOUTH ONCE DAILY   . vitamin B-12  (CYANOCOBALAMIN) 1000 MCG tablet Take 1,000 mcg by mouth daily.   . Vitamin D, Cholecalciferol, 1000 units CAPS Take 1 capsule by mouth daily.   . [DISCONTINUED] terbinafine (LAMISIL) 250 MG tablet Take 1 tablet (250 mg total) by mouth daily.    No facility-administered encounter medications on file as of 05/02/2016.     Allergies  Allergen Reactions  . Amoxicillin-Pot Clavulanate     no further information given.  . Erythromycin     Other reaction(s): Stomach Ache Stomach pain.  . Glipizide   . Metformin Hcl     Review of Systems  Constitutional: Negative.   HENT: Negative.   Eyes: Negative.   Respiratory: Negative.   Cardiovascular: Negative.   Gastrointestinal: Negative.   Musculoskeletal: Positive for joint pain.  Skin: Negative.   Neurological: Positive for tingling (numbness in hands when sugar gets low).  Psychiatric/Behavioral: Negative.    Objective:  BP 136/72   Pulse 80   Temp 97.6 F (36.4 C)   Resp 16   Wt 276 lb (125.2 kg)   BMI 40.76 kg/m   Physical Exam  Constitutional: She is oriented to person, place, and time and well-developed, well-nourished, and in no distress.  HENT:  Head: Normocephalic and atraumatic.  Right Ear: External ear normal.  Left Ear: External ear normal.  Nose: Nose normal.  Eyes: Conjunctivae are normal. Pupils are equal, round, and reactive to light.  Neck: Normal range of motion. Neck supple.  Cardiovascular: Normal rate, regular rhythm, normal heart sounds and intact distal pulses.   No murmur heard. Pulmonary/Chest: Effort normal and breath sounds normal. No respiratory distress. She has no wheezes.  Abdominal: Soft.  Neurological: She is alert and oriented to person, place, and time. She exhibits normal muscle tone. Coordination normal. GCS score is 15.  Skin: Skin is warm and dry.  Psychiatric: Mood, memory, affect and judgment normal.    Assessment and Plan :  1. Diabetes mellitus without complication (HCC) 123XX123 9.3  about the same. I am not sure if her meter is accurate because the readings she is getting at home it does not match the A1C number we are getting. Add Jardiance and follow up next visit. Samples provided. If patient tolerates this will send in RX then.Start at 10mg  daily.  2. Essential (primary) hypertension Stable.  3. Avitaminosis D Will start Vitamin D 50,000 units RX once a week and re check levels on the next visit. Stop OTC Vitamin D.  4. Other chest pain Symptoms she described seemed this was muscular and has not had re occurrence of this, she was active moving boxes when this pain started. Will follow as needed. Patient agrees with the plan.No pain/discomfort/DOE. 5.Obesity   HPI, Exam and A&P transcribed under direction and in the presence of Miguel Aschoff, MD. I have done the exam and reviewed the chart and it is accurate to the best of my knowledge.  Miguel Aschoff M.D. [facility]

## 2016-05-05 ENCOUNTER — Other Ambulatory Visit: Payer: Self-pay | Admitting: Family Medicine

## 2016-05-05 DIAGNOSIS — E119 Type 2 diabetes mellitus without complications: Secondary | ICD-10-CM

## 2016-05-30 ENCOUNTER — Other Ambulatory Visit: Payer: Self-pay | Admitting: Family Medicine

## 2016-05-30 NOTE — Telephone Encounter (Signed)
Ok to rf for 1 year. 

## 2016-05-30 NOTE — Telephone Encounter (Signed)
Pt contacted office for refill request on the following medications:  One touch Ultra lancets.  [facility] [place].  CB#[phone]/MW  One touch Ultra lancets  empagliflozin (JARDIANCE) 10 MG TABS tablet

## 2016-05-30 NOTE — Telephone Encounter (Signed)
Please review. KW 

## 2016-05-31 ENCOUNTER — Other Ambulatory Visit: Payer: Self-pay

## 2016-05-31 MED ORDER — EMPAGLIFLOZIN 10 MG PO TABS: 10.0000 mg | ORAL_TABLET | Freq: Every day | ORAL | 12 refills | Status: DC

## 2016-05-31 MED ORDER — ONETOUCH ULTRASOFT LANCETS MISC: 12 refills | Status: DC

## 2016-05-31 NOTE — Telephone Encounter (Signed)
Left message for patient notifying her that prescriptions were sent to pharmacy. KW

## 2016-06-01 ENCOUNTER — Other Ambulatory Visit: Payer: Self-pay

## 2016-06-01 MED ORDER — ONETOUCH ULTRASOFT LANCETS MISC: 12 refills | Status: AC

## 2016-06-19 ENCOUNTER — Other Ambulatory Visit: Payer: Self-pay | Admitting: Family Medicine

## 2016-06-19 DIAGNOSIS — K219 Gastro-esophageal reflux disease without esophagitis: Secondary | ICD-10-CM

## 2016-07-06 ENCOUNTER — Other Ambulatory Visit: Payer: Self-pay | Admitting: Family Medicine

## 2016-07-06 DIAGNOSIS — E7800 Pure hypercholesterolemia, unspecified: Secondary | ICD-10-CM

## 2016-08-02 ENCOUNTER — Ambulatory Visit: Payer: BC Managed Care – PPO | Admitting: Family Medicine

## 2016-08-08 ENCOUNTER — Encounter: Payer: Self-pay | Admitting: Family Medicine

## 2016-08-08 ENCOUNTER — Ambulatory Visit (INDEPENDENT_AMBULATORY_CARE_PROVIDER_SITE_OTHER): Payer: BC Managed Care – PPO | Admitting: Family Medicine

## 2016-08-08 VITALS — BP 132/70 | HR 76 | Temp 97.6°F | Resp 16 | Wt 265.0 lb

## 2016-08-08 DIAGNOSIS — E559 Vitamin D deficiency, unspecified: Secondary | ICD-10-CM

## 2016-08-08 DIAGNOSIS — E119 Type 2 diabetes mellitus without complications: Secondary | ICD-10-CM

## 2016-08-08 DIAGNOSIS — E7800 Pure hypercholesterolemia, unspecified: Secondary | ICD-10-CM

## 2016-08-08 DIAGNOSIS — I10 Essential (primary) hypertension: Secondary | ICD-10-CM

## 2016-08-08 LAB — POCT GLYCOSYLATED HEMOGLOBIN (HGB A1C)
Est. average glucose Bld gHb Est-mCnc: 157
Hemoglobin A1C: 7.1

## 2016-08-08 NOTE — Progress Notes (Signed)
Patient: Karen Stephenson Female    DOB: [birth year]   61 y.o.   MRN: [id number] Visit Date: 08/08/2016  Today's Provider: [name], MD   Chief Complaint  Patient presents with  . Diabetes  . Vitamin D Deficiency   Subjective:    HPI  Diabetes Mellitus Type II, Follow-up:   Lab Results  Component Value Date   HGBA1C 9.3 05/02/2016   HGBA1C 9.6 (H) 02/01/2016   HGBA1C 9.6 (H) 10/28/2015    Last seen for diabetes 3 months ago.  Management since then includes adding Jardiance 10 mg. She reports excellent compliance with treatment. She is not having side effects.  Current symptoms include none and have been stable. Home blood sugar records: fasting range: 130's  Episodes of hypoglycemia? no   Current Insulin Regimen: N/A Most Recent Eye Exam: is due. Is going to make appointment. Weight trend: decreasing steadily Prior visit with dietician: no Current diet: in general, a "healthy" diet  Pt is dieting; watching carb intake, drinking more water. Current exercise: walking  Pertinent Labs:    Component Value Date/Time   CHOL 183 10/28/2015 1117   TRIG 158 (H) 10/28/2015 1117   HDL 36 (L) 10/28/2015 1117   LDLCALC 115 (H) 10/28/2015 1117   CREATININE 0.82 10/28/2015 1117    Wt Readings from Last 3 Encounters:  08/08/16 265 lb (120.2 kg)  05/02/16 276 lb (125.2 kg)  01/25/16 273 lb (123.8 kg)    ------------------------------------------------------------------------    Avitaminosis D Last Vitamin D was checked 10/28/2015 and was 17.4. Pt was started on Vitamin D 50,000 IU q 7 days. Pt needs this level rechecked. All other labs due in the spring.  Allergies  Allergen Reactions  . Amoxicillin-Pot Clavulanate     no further information given.  . Erythromycin     Other reaction(s): Stomach Ache Stomach pain.  . Glipizide   . Metformin Hcl      Current Outpatient Prescriptions:  .  acarbose (PRECOSE) 25 MG tablet, TAKE ONE TABLET BY MOUTH  THREE TIMES DAILY WITH MEALS, Disp: 90 tablet, Rfl: 3 .  albuterol (PROVENTIL HFA) 108 (90 BASE) MCG/ACT inhaler, Inhale 1-2 puffs into the lungs as needed. , Disp: , Rfl:  .  BIOTIN PO, Take 1 tablet by mouth daily., Disp: , Rfl:  .  Cetirizine HCl (ZYRTEC ALLERGY) 10 MG CAPS, Take 1 capsule by mouth daily. , Disp: , Rfl:  .  empagliflozin (JARDIANCE) 10 MG TABS tablet, Take 10 mg by mouth daily., Disp: 30 tablet, Rfl: 12 .  fluticasone (FLONASE) 50 MCG/ACT nasal spray, USE TWO SPRAY(S) IN EACH NOSTRIL ONCE DAILY, Disp: 48 g, Rfl: 3 .  Fluticasone-Salmeterol (ADVAIR DISKUS) 250-50 MCG/DOSE AEPB, Inhale 1 puff into the lungs as needed., Disp: 60 each, Rfl: 12 .  glucose blood test strip, Check sugar once daily, E 11.9, Disp: 25 each, Rfl: 12 .  Homeopathic Products (LEG CRAMP RELIEF PO), Take 2 capsules by mouth daily. , Disp: , Rfl:  .  hyoscyamine (LEVBID) 0.375 MG 12 hr tablet, Take 0.375 mg by mouth as needed. , Disp: , Rfl:  .  JANUVIA 100 MG tablet, TAKE ONE TABLET BY MOUTH ONCE DAILY, Disp: 90 tablet, Rfl: 3 .  Lancets (ONETOUCH ULTRASOFT) lancets, Check sugar once daily ,DX E11.9, Disp: 30 each, Rfl: 12 .  losartan-hydrochlorothiazide (HYZAAR) 100-25 MG tablet, Take 1 tablet by mouth daily., Disp: 30 tablet, Rfl: 5 .  lovastatin (MEVACOR) 10 MG tablet,  TAKE ONE TABLET BY MOUTH IN THE EVENING, Disp: 90 tablet, Rfl: 3 .  montelukast (SINGULAIR) 10 MG tablet, TAKE ONE TABLET BY MOUTH AT BEDTIME, Disp: 90 tablet, Rfl: 3 .  pantoprazole (PROTONIX) 40 MG tablet, TAKE ONE TABLET BY MOUTH ONCE DAILY, Disp: 90 tablet, Rfl: 3 .  vitamin B-12 (CYANOCOBALAMIN) 1000 MCG tablet, Take 1,000 mcg by mouth daily., Disp: , Rfl:  .  Vitamin D, Ergocalciferol, (DRISDOL) 50000 units CAPS capsule, Take 1 capsule (50,000 Units total) by mouth every 7 (seven) days., Disp: 4 capsule, Rfl: 12  Review of Systems  Constitutional: Negative for activity change, appetite change, chills, diaphoresis, fatigue, fever and  unexpected weight change.  Eyes: Negative for visual disturbance.  Respiratory: Negative for shortness of breath.   Cardiovascular: Negative for chest pain, palpitations and leg swelling.  Endocrine: Negative for polydipsia, polyphagia and polyuria.    Social History  Substance Use Topics  . Smoking status: Former Smoker    Types: Cigarettes    Quit date: 07/01/2013  . Smokeless tobacco: Never Used  . Alcohol use Yes     Comment: rarely   Objective:   BP 132/70 (BP Location: Right Arm, Patient Position: Sitting, Cuff Size: Large)   Pulse 76   Temp 97.6 F (36.4 C) (Oral)   Resp 16   Wt 265 lb (120.2 kg)   BMI 39.13 kg/m   Physical Exam  Constitutional: She is oriented to person, place, and time. She appears well-developed and well-nourished.  HENT:  Head: Normocephalic and atraumatic.  Right Ear: External ear normal.  Left Ear: External ear normal.  Nose: Nose normal.  Eyes: Conjunctivae are normal.  Neck: Normal range of motion. Neck supple. No thyromegaly present.  Cardiovascular: Normal rate, regular rhythm and normal heart sounds.   Pulmonary/Chest: Effort normal and breath sounds normal. No respiratory distress.  Abdominal: Soft.  Musculoskeletal: She exhibits edema (trace).  Neurological: She is alert and oriented to person, place, and time.  Skin: Skin is warm and dry.  Psychiatric: She has a normal mood and affect. Her behavior is normal. Judgment and thought content normal.        Assessment & Plan:     1. Type 2 diabetes mellitus without complication, without long-term current use of insulin (HCC) Improving. Continue current medications and healthy lifestyle. Pt lost 11 pounds since LOV. Encouraged pt to keep up the good work. - POCT glycosylated hemoglobin (Hb A1C) Results for orders placed or performed in visit on 08/08/16  POCT glycosylated hemoglobin (Hb A1C)  Result Value Ref Range   Hemoglobin A1C 7.1    Est. average glucose Bld gHb Est-mCnc 157      2. Avitaminosis D Recheck level today. FU pending results. - VITAMIN D 25 Hydroxy (Vit-D Deficiency, Fractures)  3. Hypercholesteremia FU pending lab results. - Lipid panel - TSH  4. Essential (primary) hypertension Stable. Continue current medications. Will check labs. FU pending results. - Comprehensive metabolic panel - CBC with Differential/Platelet     Patient seen and examined by Miguel Aschoff, MD, and note scribed by Renaldo Fiddler, CMA. I have done the exam and reviewed the above chart and it is accurate to the best of my knowledge. Development worker, community has been used in this note in any air is in the dictation or transcription are unintentional.  [name], MD  [place]

## 2016-09-12 ENCOUNTER — Other Ambulatory Visit: Payer: Self-pay | Admitting: Family Medicine

## 2016-09-12 DIAGNOSIS — E119 Type 2 diabetes mellitus without complications: Secondary | ICD-10-CM

## 2016-10-24 ENCOUNTER — Encounter: Payer: BC Managed Care – PPO | Admitting: Family Medicine

## 2016-10-31 ENCOUNTER — Encounter: Payer: BC Managed Care – PPO | Admitting: Family Medicine

## 2016-11-07 ENCOUNTER — Encounter: Payer: BC Managed Care – PPO | Admitting: Family Medicine

## 2016-11-07 LAB — CBC WITH DIFFERENTIAL/PLATELET
Basophils Absolute: 0 10*3/uL (ref 0.0–0.2)
Basos: 0 %
EOS (ABSOLUTE): 0.2 10*3/uL (ref 0.0–0.4)
Eos: 2 %
Hematocrit: 40.2 % (ref 34.0–46.6)
Hemoglobin: 13.5 g/dL (ref 11.1–15.9)
Immature Grans (Abs): 0 10*3/uL (ref 0.0–0.1)
Immature Granulocytes: 0 %
Lymphocytes Absolute: 2 10*3/uL (ref 0.7–3.1)
Lymphs: 29 %
MCH: 27.5 pg (ref 26.6–33.0)
MCHC: 33.6 g/dL (ref 31.5–35.7)
MCV: 82 fL (ref 79–97)
Monocytes Absolute: 0.4 10*3/uL (ref 0.1–0.9)
Monocytes: 6 %
Neutrophils Absolute: 4.3 10*3/uL (ref 1.4–7.0)
Neutrophils: 63 %
Platelets: 271 10*3/uL (ref 150–379)
RBC: 4.91 x10E6/uL (ref 3.77–5.28)
RDW: 14 % (ref 12.3–15.4)
WBC: 7 10*3/uL (ref 3.4–10.8)

## 2016-11-07 LAB — COMPREHENSIVE METABOLIC PANEL
ALT: 21 IU/L (ref 0–32)
AST: 22 IU/L (ref 0–40)
Albumin/Globulin Ratio: 1.2 (ref 1.2–2.2)
Albumin: 4 g/dL (ref 3.6–4.8)
Alkaline Phosphatase: 66 IU/L (ref 39–117)
BUN/Creatinine Ratio: 18 (ref 12–28)
BUN: 15 mg/dL (ref 8–27)
Bilirubin Total: 0.2 mg/dL (ref 0.0–1.2)
CO2: 23 mmol/L (ref 18–29)
Calcium: 9.3 mg/dL (ref 8.7–10.3)
Chloride: 101 mmol/L (ref 96–106)
Creatinine, Ser: 0.82 mg/dL (ref 0.57–1.00)
GFR calc Af Amer: 89 mL/min/{1.73_m2} (ref >59)
GFR calc non Af Amer: 77 mL/min/{1.73_m2} (ref >59)
Globulin, Total: 3.4 g/dL (ref 1.5–4.5)
Glucose: 133 mg/dL — ABNORMAL HIGH (ref 65–99)
Potassium: 3.9 mmol/L (ref 3.5–5.2)
Sodium: 141 mmol/L (ref 134–144)
Total Protein: 7.4 g/dL (ref 6.0–8.5)

## 2016-11-07 LAB — LIPID PANEL
Chol/HDL Ratio: 5 ratio — ABNORMAL HIGH (ref 0.0–4.4)
Cholesterol, Total: 161 mg/dL (ref 100–199)
HDL: 32 mg/dL — ABNORMAL LOW (ref >39)
LDL Calculated: 105 mg/dL — ABNORMAL HIGH (ref 0–99)
Triglycerides: 122 mg/dL (ref 0–149)
VLDL Cholesterol Cal: 24 mg/dL (ref 5–40)

## 2016-11-07 LAB — VITAMIN D 25 HYDROXY (VIT D DEFICIENCY, FRACTURES): Vit D, 25-Hydroxy: 49.3 ng/mL (ref 30.0–100.0)

## 2016-11-07 LAB — TSH: TSH: 4.28 u[IU]/mL (ref 0.450–4.500)

## 2016-11-14 ENCOUNTER — Ambulatory Visit (INDEPENDENT_AMBULATORY_CARE_PROVIDER_SITE_OTHER): Payer: BC Managed Care – PPO | Admitting: Family Medicine

## 2016-11-14 ENCOUNTER — Encounter: Payer: Self-pay | Admitting: Family Medicine

## 2016-11-14 VITALS — BP 126/78 | HR 72 | Temp 97.9°F | Resp 16 | Ht 68.0 in | Wt 263.0 lb

## 2016-11-14 DIAGNOSIS — Z1211 Encounter for screening for malignant neoplasm of colon: Secondary | ICD-10-CM

## 2016-11-14 DIAGNOSIS — E119 Type 2 diabetes mellitus without complications: Secondary | ICD-10-CM | POA: Diagnosis not present

## 2016-11-14 DIAGNOSIS — Z0000 Encounter for general adult medical examination without abnormal findings: Secondary | ICD-10-CM

## 2016-11-14 DIAGNOSIS — Z1231 Encounter for screening mammogram for malignant neoplasm of breast: Secondary | ICD-10-CM

## 2016-11-14 DIAGNOSIS — Z1239 Encounter for other screening for malignant neoplasm of breast: Secondary | ICD-10-CM

## 2016-11-14 LAB — POCT UA - MICROALBUMIN: Microalbumin Ur, POC: 20 mg/L

## 2016-11-14 LAB — POCT GLYCOSYLATED HEMOGLOBIN (HGB A1C)
Est. average glucose Bld gHb Est-mCnc: 163
Hemoglobin A1C: 7.4

## 2016-11-14 NOTE — Progress Notes (Signed)
Patient: Karen Stephenson, Female    DOB: [birth year], 61 y.o.   MRN: [id number] Visit Date: 11/14/2016  Today's Provider: [name], MD   Chief Complaint  Patient presents with  . Annual Exam   Subjective:    Annual physical exam Karen Stephenson is a 61 y.o. female who presents today for health maintenance and complete physical. She feels well. She reports no regular exercise, but she does stay active . She reports she is sleeping well.  Colonoscopy- 08/07/2005. Internal Hemorrhoids. Repeat 10 yrs. Pap- 07/15/2014. Normal. HPV negative.  BMD- Mammogram- 09/14/2014. Normal.    Review of Systems  Constitutional: Positive for diaphoresis.       Has night sweats  HENT: Positive for congestion, postnasal drip and sore throat.   Eyes: Negative.   Respiratory: Negative.   Cardiovascular: Positive for leg swelling.  Gastrointestinal: Negative.   Endocrine: Negative.   Genitourinary: Negative.   Musculoskeletal: Positive for arthralgias and back pain.  Skin: Negative.   Allergic/Immunologic: Positive for environmental allergies.  Neurological: Negative.   Hematological: Negative.   Psychiatric/Behavioral: Negative.     Social History      She  reports that she quit smoking about 3 years ago. Her smoking use included Cigarettes. She has never used smokeless tobacco. She reports that she does not drink alcohol or use drugs.       Social History   Social History  . Marital status: Married    Spouse name: N/A  . Number of children: N/A  . Years of education: N/A   Social History Main Topics  . Smoking status: Former Smoker    Types: Cigarettes    Quit date: 07/01/2013  . Smokeless tobacco: Never Used  . Alcohol use No  . Drug use: No  . Sexual activity: Not Asked   Other Topics Concern  . None   Social History Narrative  . None    No past medical history on file.   Patient Active Problem List   Diagnosis Date Noted  . Diabetes mellitus without  complication ([place]) 51/08/5850  . Airway hyperreactivity 12/13/2014  . Acid reflux 12/13/2014  . Hypercholesteremia 12/13/2014  . Decreased potassium in the blood 12/13/2014  . Cramps of lower extremity 12/13/2014  . Extreme obesity 12/13/2014  . Avitaminosis D 12/13/2014  . Dermatophytic onychia 12/24/2013  . Bony exostosis 12/24/2013  . Unstable angina pectoris ([place]) 12/24/2013  . Polyneuropathy 12/24/2013  . Family history of breast cancer 03/01/2008  . Obstructive apnea 12/26/2007  . Diaphragmatic hernia 10/08/2006  . Allergic rhinitis, seasonal 10/08/2006  . Diabetes mellitus, type 2 ([place]) 10/08/2006  . Essential (primary) hypertension 12/29/1999    Past Surgical History:  Procedure Laterality Date  . BACK SURGERY  1986  . NASAL SINUS SURGERY    . UTERINE FIBROID SURGERY      Family History        Family Status  Relation Status  . Mother Deceased  . Father Deceased at age 22  . Sister Alive  . PGF (Not Specified)  . Brother Alive        Her family history includes Breast cancer in her mother and sister; Diabetes in her mother; Heart disease in her father and paternal grandfather; Hypertension in her brother.     Allergies  Allergen Reactions  . Amoxicillin-Pot Clavulanate     no further information given.  . Erythromycin     Other reaction(s): Stomach Ache Stomach pain.  Marland Kitchen  Glipizide   . Metformin Hcl      Current Outpatient Prescriptions:  .  acarbose (PRECOSE) 25 MG tablet, TAKE ONE TABLET BY MOUTH THREE TIMES DAILY WITH  MEALS, Disp: 90 tablet, Rfl: 3 .  albuterol (PROVENTIL HFA) 108 (90 BASE) MCG/ACT inhaler, Inhale 1-2 puffs into the lungs as needed. , Disp: , Rfl:  .  BIOTIN PO, Take 1 tablet by mouth daily., Disp: , Rfl:  .  Cetirizine HCl (ZYRTEC ALLERGY) 10 MG CAPS, Take 1 capsule by mouth daily. , Disp: , Rfl:  .  empagliflozin (JARDIANCE) 10 MG TABS tablet, Take 10 mg by mouth daily., Disp: 30 tablet, Rfl: 12 .  fluticasone (FLONASE) 50 MCG/ACT  nasal spray, USE TWO SPRAY(S) IN EACH NOSTRIL ONCE DAILY, Disp: 48 g, Rfl: 3 .  Fluticasone-Salmeterol (ADVAIR DISKUS) 250-50 MCG/DOSE AEPB, Inhale 1 puff into the lungs as needed., Disp: 60 each, Rfl: 12 .  glucose blood test strip, Check sugar once daily, E 11.9, Disp: 25 each, Rfl: 12 .  Homeopathic Products (LEG CRAMP RELIEF PO), Take 2 capsules by mouth daily. , Disp: , Rfl:  .  hyoscyamine (LEVBID) 0.375 MG 12 hr tablet, Take 0.375 mg by mouth as needed. , Disp: , Rfl:  .  JANUVIA 100 MG tablet, TAKE ONE TABLET BY MOUTH ONCE DAILY, Disp: 90 tablet, Rfl: 3 .  Lancets (ONETOUCH ULTRASOFT) lancets, Check sugar once daily ,DX E11.9, Disp: 30 each, Rfl: 12 .  losartan-hydrochlorothiazide (HYZAAR) 100-25 MG tablet, Take 1 tablet by mouth daily., Disp: 30 tablet, Rfl: 5 .  lovastatin (MEVACOR) 10 MG tablet, TAKE ONE TABLET BY MOUTH IN THE EVENING, Disp: 90 tablet, Rfl: 3 .  montelukast (SINGULAIR) 10 MG tablet, TAKE ONE TABLET BY MOUTH AT BEDTIME, Disp: 90 tablet, Rfl: 3 .  pantoprazole (PROTONIX) 40 MG tablet, TAKE ONE TABLET BY MOUTH ONCE DAILY, Disp: 90 tablet, Rfl: 3 .  vitamin B-12 (CYANOCOBALAMIN) 1000 MCG tablet, Take 1,000 mcg by mouth daily., Disp: , Rfl:  .  Vitamin D, Ergocalciferol, (DRISDOL) 50000 units CAPS capsule, Take 1 capsule (50,000 Units total) by mouth every 7 (seven) days., Disp: 4 capsule, Rfl: 12   Patient Care Team: Jerrol Banana., MD as PCP - General (Family Medicine)      Objective:   Vitals: BP 126/78 (BP Location: Right Arm, Patient Position: Sitting, Cuff Size: Large)   Pulse 72   Temp 97.9 F (36.6 C)   Resp 16   Ht 5\' 8"  (1.727 m)   Wt 263 lb (119.3 kg)   SpO2 99%   BMI 39.99 kg/m    Vitals:   11/14/16 0944  BP: 126/78  Pulse: 72  Resp: 16  Temp: 97.9 F (36.6 C)  SpO2: 99%  Weight: 263 lb (119.3 kg)  Height: 5\' 8"  (1.727 m)     Physical Exam  Constitutional: She is oriented to person, place, and time. She appears well-developed and  well-nourished.  Obese BF NAD  HENT:  Head: Normocephalic and atraumatic.  Right Ear: External ear normal.  Left Ear: External ear normal.  Nose: Nose normal.  Mouth/Throat: Oropharynx is clear and moist.  Eyes: Conjunctivae are normal. No scleral icterus.  Neck: Neck supple. No thyromegaly present.  Cardiovascular: Normal rate, regular rhythm and normal heart sounds.   Pulmonary/Chest: Effort normal.  Breast exam normal.  Abdominal: Soft.  Musculoskeletal: She exhibits no edema.  Neurological: She is alert and oriented to person, place, and time. No cranial nerve deficit. She exhibits  normal muscle tone. Coordination normal.  Skin: Skin is warm and dry.  Psychiatric: She has a normal mood and affect. Her behavior is normal. Judgment and thought content normal.     Depression Screen PHQ 2/9 Scores 11/14/2016 10/28/2015  PHQ - 2 Score 0 0  PHQ- 9 Score 0 -        Assessment & Plan:     Routine Health Maintenance and Physical Exam  Exercise Activities and Dietary recommendations Goals    . Exercise 150 minutes per week (moderate activity)       Immunization History  Administered Date(s) Administered  . Pneumococcal Polysaccharide-23 02/29/2012  . Td 08/17/2003  . Tdap 04/18/2011    Health Maintenance  Topic Date Due  . HIV Screening  05/03/1969  . OPHTHALMOLOGY EXAM  07/02/2014  . COLONOSCOPY  08/08/2015  . MAMMOGRAM  09/13/2016  . FOOT EXAM  11/08/2016  . INFLUENZA VACCINE  01/30/2017  . HEMOGLOBIN A1C  02/05/2017  . PNEUMOCOCCAL POLYSACCHARIDE VACCINE (2) 02/28/2017  . PAP SMEAR  07/15/2017  . TETANUS/TDAP  04/17/2021  . Hepatitis C Screening  Completed     Discussed health benefits of physical activity, and encouraged her to engage in regular exercise appropriate for her age and condition.  TIIDM A1C is 7.4 today.       Richard Cranford Mon, MD  [facility]

## 2016-11-23 ENCOUNTER — Other Ambulatory Visit: Payer: Self-pay | Admitting: Family Medicine

## 2016-11-23 ENCOUNTER — Telehealth: Payer: Self-pay | Admitting: Family Medicine

## 2016-11-23 DIAGNOSIS — I10 Essential (primary) hypertension: Secondary | ICD-10-CM

## 2016-11-23 MED ORDER — LOSARTAN POTASSIUM-HCTZ 100-25 MG PO TABS: 1.0000 | ORAL_TABLET | Freq: Every day | ORAL | 3 refills | Status: DC

## 2016-11-23 NOTE — Telephone Encounter (Signed)
[facility] faxed a request on the following medication. Thanks CC  losartan-hydrochlorothiazide (HYZAAR) 100-25 MG tablet  Take one table by mouth once daily.

## 2016-11-23 NOTE — Telephone Encounter (Signed)
[facility] faxed a refill request on the following medications: [facility] [place]-fax #[phone]/VD   losartan-hydrochlorothiazide (HYZAAR) 100-25 MG tablet.  Take 1 tablet by mouth once daily. 30 day supply.

## 2016-11-23 NOTE — Telephone Encounter (Signed)
1 year refills. 

## 2017-01-22 ENCOUNTER — Other Ambulatory Visit: Payer: Self-pay | Admitting: Family Medicine

## 2017-01-22 DIAGNOSIS — E119 Type 2 diabetes mellitus without complications: Secondary | ICD-10-CM

## 2017-03-20 ENCOUNTER — Encounter: Payer: Self-pay | Admitting: Family Medicine

## 2017-03-20 ENCOUNTER — Ambulatory Visit (INDEPENDENT_AMBULATORY_CARE_PROVIDER_SITE_OTHER): Payer: BC Managed Care – PPO | Admitting: Family Medicine

## 2017-03-20 VITALS — BP 112/64 | HR 62 | Temp 98.0°F | Resp 14 | Wt 263.6 lb

## 2017-03-20 DIAGNOSIS — I10 Essential (primary) hypertension: Secondary | ICD-10-CM

## 2017-03-20 DIAGNOSIS — E7800 Pure hypercholesterolemia, unspecified: Secondary | ICD-10-CM | POA: Diagnosis not present

## 2017-03-20 DIAGNOSIS — J00 Acute nasopharyngitis [common cold]: Secondary | ICD-10-CM | POA: Diagnosis not present

## 2017-03-20 DIAGNOSIS — E119 Type 2 diabetes mellitus without complications: Secondary | ICD-10-CM | POA: Diagnosis not present

## 2017-03-20 DIAGNOSIS — IMO0001 Reserved for inherently not codable concepts without codable children: Secondary | ICD-10-CM

## 2017-03-20 LAB — POCT GLYCOSYLATED HEMOGLOBIN (HGB A1C): Hemoglobin A1C: 6.5

## 2017-03-20 NOTE — Progress Notes (Signed)
Karen Stephenson  MRN: [id number] DOB: April 01, 1956  Subjective:  HPI   Patient is here for follow up. Last office visit was on 11/14/16 for CPE. Routine lab work was done on 11/06/16 and was stable. DM: patient is checking her sugar fasting but nit every day, reading usually around 130-140. No hypoglycemic episodes or symptoms. Lab Results  Component Value Date   HGBA1C 7.4 11/14/2016   Wt Readings from Last 3 Encounters:  03/20/17 263 lb 9.6 oz (119.6 kg)  11/14/16 263 lb (119.3 kg)  08/08/16 265 lb (120.2 kg)     Patient Active Problem List   Diagnosis Date Noted  . Diabetes mellitus without complication ([place]) 51/08/5850  . Airway hyperreactivity 12/13/2014  . Acid reflux 12/13/2014  . Hypercholesteremia 12/13/2014  . Decreased potassium in the blood 12/13/2014  . Cramps of lower extremity 12/13/2014  . Extreme obesity 12/13/2014  . Avitaminosis D 12/13/2014  . Dermatophytic onychia 12/24/2013  . Bony exostosis 12/24/2013  . Unstable angina pectoris ([place]) 12/24/2013  . Polyneuropathy 12/24/2013  . Family history of breast cancer 03/01/2008  . Obstructive apnea 12/26/2007  . Diaphragmatic hernia 10/08/2006  . Allergic rhinitis, seasonal 10/08/2006  . Diabetes mellitus, type 2 ([place]) 10/08/2006  . Essential (primary) hypertension 12/29/1999    No past medical history on file.  Social History   Social History  . Marital status: Married    Spouse name: N/A  . Number of children: N/A  . Years of education: N/A   Occupational History  . Not on file.   Social History Main Topics  . Smoking status: Former Smoker    Types: Cigarettes    Quit date: 07/01/2013  . Smokeless tobacco: Never Used  . Alcohol use No  . Drug use: No  . Sexual activity: Not on file   Other Topics Concern  . Not on file   Social History Narrative  . No narrative on file    Outpatient Encounter Prescriptions as of 03/20/2017  Medication Sig Note  . acarbose (PRECOSE) 25 MG tablet TAKE  1 TABLET BY MOUTH THREE TIMES DAILY WITH MEALS   . albuterol (PROVENTIL HFA) 108 (90 BASE) MCG/ACT inhaler Inhale 1-2 puffs into the lungs as needed.  12/13/2014: Medication taken as needed.  Received from: [facility]  . BIOTIN PO Take 1 tablet by mouth daily.   . Cetirizine HCl (ZYRTEC ALLERGY) 10 MG CAPS Take 1 capsule by mouth daily.  12/13/2014: Received from: [facility]  . empagliflozin (JARDIANCE) 10 MG TABS tablet Take 10 mg by mouth daily.   . fluticasone (FLONASE) 50 MCG/ACT nasal spray USE TWO SPRAY(S) IN EACH NOSTRIL ONCE DAILY   . Fluticasone-Salmeterol (ADVAIR DISKUS) 250-50 MCG/DOSE AEPB Inhale 1 puff into the lungs as needed.   Marland Kitchen glucose blood test strip Check sugar once daily, E 11.9   . Homeopathic Products (LEG CRAMP RELIEF PO) Take 2 capsules by mouth daily.  12/13/2014: Received from: [facility]  . hyoscyamine (LEVBID) 0.375 MG 12 hr tablet Take 0.375 mg by mouth as needed.  12/13/2014: Medication taken as needed.  Received from: [facility]  . JANUVIA 100 MG tablet TAKE ONE TABLET BY MOUTH ONCE DAILY   . Lancets (ONETOUCH ULTRASOFT) lancets Check sugar once daily ,DX E11.9   . losartan-hydrochlorothiazide (HYZAAR) 100-25 MG tablet Take 1 tablet by mouth daily.   Marland Kitchen lovastatin (MEVACOR) 10 MG tablet TAKE ONE TABLET BY MOUTH IN THE EVENING   . montelukast (SINGULAIR) 10  MG tablet TAKE ONE TABLET BY MOUTH AT BEDTIME   . pantoprazole (PROTONIX) 40 MG tablet TAKE ONE TABLET BY MOUTH ONCE DAILY   . vitamin B-12 (CYANOCOBALAMIN) 1000 MCG tablet Take 1,000 mcg by mouth daily.   . Vitamin D, Ergocalciferol, (DRISDOL) 50000 units CAPS capsule Take 1 capsule (50,000 Units total) by mouth every 7 (seven) days.    No facility-administered encounter medications on file as of 03/20/2017.     Allergies  Allergen Reactions  . Amoxicillin-Pot Clavulanate     no further information given.  . Erythromycin     Other  reaction(s): Stomach Ache Stomach pain.  . Glipizide   . Metformin Hcl     Review of Systems  Constitutional: Negative.   HENT: Positive for congestion, ear pain and sore throat.        Drainage  Respiratory: Negative.   Cardiovascular: Negative.   Gastrointestinal: Negative.   Musculoskeletal: Negative.   Neurological:       Numbness toes.    Objective:  BP 112/64   Pulse 62   Temp 98 F (36.7 C)   Resp 14   Wt 263 lb 9.6 oz (119.6 kg)   BMI 40.08 kg/m   Physical Exam  Constitutional: She is oriented to person, place, and time and well-developed, well-nourished, and in no distress.  HENT:  Head: Normocephalic and atraumatic.  Right Ear: External ear normal.  Left Ear: External ear normal.  Mouth/Throat: Oropharynx is clear and moist.  Cerumen is present in the right ear.  Eyes: Pupils are equal, round, and reactive to light. Conjunctivae are normal.  Cardiovascular: Normal rate, regular rhythm, normal heart sounds and intact distal pulses.  Exam reveals no gallop.   No murmur heard. Pulmonary/Chest: Effort normal and breath sounds normal. No respiratory distress. She has no wheezes.  Neurological: She is alert and oriented to person, place, and time.    Assessment and Plan :  1. Type 2 diabetes mellitus without complication, without long-term current use of insulin (HCC) 6.5 better. Continue current regimen. Work on habits. - POCT HgB A1C  2. Hypercholesteremia 3. Essential (primary) hypertension Stable.  4. Cold Push fluids, rest, take Robitussin during the day. Do not think antibiotics are needed at this time. Advised patient to wait on getting flu shot for about 3 weeks. Follow as needed.  HPI, Exam and A&P transcribed by Tiffany Kocher, RMA under direction and in the presence of Miguel Aschoff, MD. I have done the exam and reviewed the chart and it is accurate to the best of my knowledge. Development worker, community has been used and  any errors in dictation or  transcription are unintentional. Miguel Aschoff M.D. [facility]

## 2017-03-20 NOTE — Patient Instructions (Addendum)
Just a reminder to get mammogram scheduled and eye exam :). Can take Robitussin during the day for the cold symptoms.

## 2017-04-03 ENCOUNTER — Ambulatory Visit
Admission: RE | Admit: 2017-04-03 | Discharge: 2017-04-03 | Disposition: A | Discharge status: Home / Self Care | Payer: BC Managed Care – PPO | Source: Ambulatory Visit | Attending: Family Medicine | Admitting: Family Medicine

## 2017-04-03 DIAGNOSIS — Z1231 Encounter for screening mammogram for malignant neoplasm of breast: Secondary | ICD-10-CM | POA: Insufficient documentation

## 2017-04-03 DIAGNOSIS — Z1239 Encounter for other screening for malignant neoplasm of breast: Secondary | ICD-10-CM

## 2017-04-19 ENCOUNTER — Other Ambulatory Visit: Payer: Self-pay | Admitting: Family Medicine

## 2017-04-19 DIAGNOSIS — E119 Type 2 diabetes mellitus without complications: Secondary | ICD-10-CM

## 2017-04-25 ENCOUNTER — Other Ambulatory Visit: Payer: Self-pay | Admitting: Family Medicine

## 2017-04-25 DIAGNOSIS — J302 Other seasonal allergic rhinitis: Secondary | ICD-10-CM

## 2017-05-06 ENCOUNTER — Other Ambulatory Visit: Payer: Self-pay | Admitting: Family Medicine

## 2017-05-20 ENCOUNTER — Other Ambulatory Visit: Payer: Self-pay | Admitting: Family Medicine

## 2017-05-20 DIAGNOSIS — K219 Gastro-esophageal reflux disease without esophagitis: Secondary | ICD-10-CM

## 2017-05-21 ENCOUNTER — Other Ambulatory Visit: Payer: Self-pay | Admitting: Family Medicine

## 2017-05-21 DIAGNOSIS — J302 Other seasonal allergic rhinitis: Secondary | ICD-10-CM

## 2017-06-06 ENCOUNTER — Other Ambulatory Visit: Payer: Self-pay | Admitting: Family Medicine

## 2017-06-06 DIAGNOSIS — E119 Type 2 diabetes mellitus without complications: Secondary | ICD-10-CM

## 2017-06-12 ENCOUNTER — Ambulatory Visit: Payer: BC Managed Care – PPO | Admitting: Family Medicine

## 2017-06-12 VITALS — BP 110/66 | HR 78 | Temp 98.5°F | Resp 14 | Wt 265.4 lb

## 2017-06-12 DIAGNOSIS — I10 Essential (primary) hypertension: Secondary | ICD-10-CM | POA: Diagnosis not present

## 2017-06-12 DIAGNOSIS — I889 Nonspecific lymphadenitis, unspecified: Secondary | ICD-10-CM

## 2017-06-12 DIAGNOSIS — G4733 Obstructive sleep apnea (adult) (pediatric): Secondary | ICD-10-CM

## 2017-06-12 DIAGNOSIS — E119 Type 2 diabetes mellitus without complications: Secondary | ICD-10-CM | POA: Diagnosis not present

## 2017-06-12 MED ORDER — AMOXICILLIN 500 MG PO CAPS: 500.0000 mg | ORAL_CAPSULE | Freq: Three times a day (TID) | ORAL | 0 refills | Status: DC

## 2017-06-12 NOTE — Progress Notes (Signed)
Karen Stephenson  MRN: [id number] DOB: [birth year]  Subjective:  HPI  Patient is here to discuss neck swelling. Symptom started on Monday 06/10/17. Patient states she woke up in the morning and noticed under right jaw/right side of the neck swelling there. No pain but discomfort is present when she is eating and swallowing. No sore throat. No fever or chills. Some post nasal drainage and nasal congestion present, and is having right ear soreness now also. Swelling has decreased as of today from when it began. She has been taking Tylenol.  Patient Active Problem List   Diagnosis Date Noted  . Diabetes mellitus without complication ([place]) 19/37/9024  . Airway hyperreactivity 12/13/2014  . Acid reflux 12/13/2014  . Hypercholesteremia 12/13/2014  . Decreased potassium in the blood 12/13/2014  . Cramps of lower extremity 12/13/2014  . Extreme obesity 12/13/2014  . Avitaminosis D 12/13/2014  . Dermatophytic onychia 12/24/2013  . Bony exostosis 12/24/2013  . Unstable angina pectoris ([place]) 12/24/2013  . Polyneuropathy 12/24/2013  . Family history of breast cancer 03/01/2008  . Obstructive apnea 12/26/2007  . Diaphragmatic hernia 10/08/2006  . Allergic rhinitis, seasonal 10/08/2006  . Diabetes mellitus, type 2 ([place]) 10/08/2006  . Essential (primary) hypertension 12/29/1999    No past medical history on file.  Social History   Socioeconomic History  . Marital status: Married    Spouse name: Not on file  . Number of children: Not on file  . Years of education: Not on file  . Highest education level: Not on file  Social Needs  . Financial resource strain: Not on file  . Food insecurity - worry: Not on file  . Food insecurity - inability: Not on file  . Transportation needs - medical: Not on file  . Transportation needs - non-medical: Not on file  Occupational History  . Not on file  Tobacco Use  . Smoking status: Former Smoker    Types: Cigarettes    Last attempt to quit:  07/01/2013    Years since quitting: 3.9  . Smokeless tobacco: Never Used  Substance and Sexual Activity  . Alcohol use: No  . Drug use: No  . Sexual activity: Not on file  Other Topics Concern  . Not on file  Social History Narrative  . Not on file    Outpatient Encounter Medications as of 06/12/2017  Medication Sig Note  . acarbose (PRECOSE) 25 MG tablet TAKE 1 TABLET BY MOUTH THREE TIMES DAILY WITH  MEALS   . albuterol (PROVENTIL HFA) 108 (90 BASE) MCG/ACT inhaler Inhale 1-2 puffs into the lungs as needed.  12/13/2014: Medication taken as needed.  Received from: [facility]  . BIOTIN PO Take 1 tablet by mouth daily.   . Cetirizine HCl (ZYRTEC ALLERGY) 10 MG CAPS Take 1 capsule by mouth daily.  12/13/2014: Received from: [facility]  . empagliflozin (JARDIANCE) 10 MG TABS tablet Take 10 mg by mouth daily.   . fluticasone (FLONASE) 50 MCG/ACT nasal spray USE TWO SPRAY(S) IN EACH NOSTRIL ONCE DAILY   . Fluticasone-Salmeterol (ADVAIR DISKUS) 250-50 MCG/DOSE AEPB Inhale 1 puff into the lungs as needed.   Marland Kitchen glucose blood test strip Check sugar once daily, E 11.9   . Homeopathic Products (LEG CRAMP RELIEF PO) Take 2 capsules by mouth daily.  12/13/2014: Received from: [facility]  . hyoscyamine (LEVBID) 0.375 MG 12 hr tablet Take 0.375 mg by mouth as needed.  12/13/2014: Medication taken as needed.  Received from: [facility]  . JANUVIA 100 MG tablet TAKE ONE TABLET BY MOUTH ONCE DAILY   . Lancets (ONETOUCH ULTRASOFT) lancets Check sugar once daily ,DX E11.9   . losartan-hydrochlorothiazide (HYZAAR) 100-25 MG tablet Take 1 tablet by mouth daily.   Marland Kitchen lovastatin (MEVACOR) 10 MG tablet TAKE ONE TABLET BY MOUTH IN THE EVENING   . montelukast (SINGULAIR) 10 MG tablet TAKE ONE TABLET BY MOUTH AT BEDTIME   . pantoprazole (PROTONIX) 40 MG tablet TAKE ONE TABLET BY MOUTH ONCE DAILY   . vitamin B-12 (CYANOCOBALAMIN) 1000 MCG tablet  Take 1,000 mcg by mouth daily.   . Vitamin D, Ergocalciferol, (DRISDOL) 50000 units CAPS capsule TAKE ONE CAPSULE BY MOUTH ONCE A WEEK    No facility-administered encounter medications on file as of 06/12/2017.     Allergies  Allergen Reactions  . Amoxicillin-Pot Clavulanate     no further information given.  . Erythromycin     Other reaction(s): Stomach Ache Stomach pain.  . Glipizide   . Metformin Hcl     Review of Systems  Constitutional: Negative.   HENT: Positive for congestion.   Eyes: Negative.   Respiratory: Negative.   Cardiovascular: Negative.   Gastrointestinal: Negative.   Musculoskeletal: Positive for myalgias.       Neck swelling  Skin: Negative.   Neurological: Negative.   Endo/Heme/Allergies: Negative.   Psychiatric/Behavioral: Negative.     Objective:  BP 110/66   Pulse 78   Temp 98.5 F (36.9 C)   Resp 14   Wt 265 lb 6.4 oz (120.4 kg)   BMI 40.35 kg/m   Physical Exam  Constitutional: She is oriented to person, place, and time and well-developed, well-nourished, and in no distress.  HENT:  Head: Normocephalic and atraumatic.  Right Ear: External ear normal.  Left Ear: External ear normal.  Nose: Nose normal.  Mouth/Throat: Oropharynx is clear and moist. No oropharyngeal exudate.  Eyes: Conjunctivae are normal. Pupils are equal, round, and reactive to light.  Neck:  Tender in the right submandibular lymphadenopathy  Cardiovascular: Normal rate, regular rhythm, normal heart sounds and intact distal pulses. Exam reveals no gallop.  No murmur heard. Pulmonary/Chest: Effort normal and breath sounds normal. No respiratory distress. She has no wheezes.  Neurological: She is alert and oriented to person, place, and time.    Assessment and Plan :  1. Lymphadenitis Discussed possibility of this been a saliva gland issue and how to deal with that. At this time will treat with Amoxicillin. Patient has allergy to Augmentin but states it was GI  issue-like upset stomach, diarrhea. Follow as needed. I do not think this is malignant at this time.  2. Type 2 diabetes mellitus without complication, without long-term current use of insulin ([place]) 3. Essential (primary) hypertension  HPI, Exam and A&P transcribed by Tiffany Kocher, RMA under direction and in the presence of Miguel Aschoff, MD. I have done the exam and reviewed the chart and it is accurate to the best of my knowledge. Development worker, community has been used and  any errors in dictation or transcription are unintentional. Miguel Aschoff M.D. [facility]

## 2017-06-23 ENCOUNTER — Other Ambulatory Visit: Payer: Self-pay | Admitting: Family Medicine

## 2017-07-16 ENCOUNTER — Encounter: Payer: Self-pay | Admitting: Student

## 2017-07-17 ENCOUNTER — Encounter
Admission: RE | Disposition: A | Discharge status: Home / Self Care | Payer: Self-pay | Source: Ambulatory Visit | Attending: Unknown Physician Specialty

## 2017-07-17 ENCOUNTER — Ambulatory Visit: Payer: BC Managed Care – PPO | Admitting: Anesthesiology

## 2017-07-17 ENCOUNTER — Ambulatory Visit: Payer: BC Managed Care – PPO | Admitting: Family Medicine

## 2017-07-17 ENCOUNTER — Encounter: Payer: Self-pay | Admitting: Anesthesiology

## 2017-07-17 ENCOUNTER — Ambulatory Visit
Admission: RE | Admit: 2017-07-17 | Discharge: 2017-07-17 | Disposition: A | Discharge status: Home / Self Care | Payer: BC Managed Care – PPO | Source: Ambulatory Visit | Attending: Unknown Physician Specialty | Admitting: Unknown Physician Specialty

## 2017-07-17 DIAGNOSIS — E7800 Pure hypercholesterolemia, unspecified: Secondary | ICD-10-CM | POA: Diagnosis not present

## 2017-07-17 DIAGNOSIS — Z87891 Personal history of nicotine dependence: Secondary | ICD-10-CM | POA: Insufficient documentation

## 2017-07-17 DIAGNOSIS — G4730 Sleep apnea, unspecified: Secondary | ICD-10-CM | POA: Insufficient documentation

## 2017-07-17 DIAGNOSIS — Z79899 Other long term (current) drug therapy: Secondary | ICD-10-CM | POA: Insufficient documentation

## 2017-07-17 DIAGNOSIS — I10 Essential (primary) hypertension: Secondary | ICD-10-CM | POA: Insufficient documentation

## 2017-07-17 DIAGNOSIS — E119 Type 2 diabetes mellitus without complications: Secondary | ICD-10-CM | POA: Insufficient documentation

## 2017-07-17 DIAGNOSIS — K640 First degree hemorrhoids: Secondary | ICD-10-CM | POA: Diagnosis not present

## 2017-07-17 DIAGNOSIS — K219 Gastro-esophageal reflux disease without esophagitis: Secondary | ICD-10-CM | POA: Diagnosis not present

## 2017-07-17 DIAGNOSIS — J45909 Unspecified asthma, uncomplicated: Secondary | ICD-10-CM | POA: Insufficient documentation

## 2017-07-17 DIAGNOSIS — Z7984 Long term (current) use of oral hypoglycemic drugs: Secondary | ICD-10-CM | POA: Diagnosis not present

## 2017-07-17 DIAGNOSIS — Z1211 Encounter for screening for malignant neoplasm of colon: Secondary | ICD-10-CM | POA: Diagnosis present

## 2017-07-17 HISTORY — DX: Gastro-esophageal reflux disease without esophagitis: K21.9

## 2017-07-17 HISTORY — DX: Pure hypercholesterolemia, unspecified: E78.00

## 2017-07-17 HISTORY — DX: Hypokalemia: E87.6

## 2017-07-17 HISTORY — DX: Sleep apnea, unspecified: G47.30

## 2017-07-17 HISTORY — PX: COLONOSCOPY WITH PROPOFOL: SHX5780

## 2017-07-17 HISTORY — DX: Essential (primary) hypertension: I10

## 2017-07-17 HISTORY — DX: Angina pectoris, unspecified: I20.9

## 2017-07-17 HISTORY — DX: Unspecified asthma, uncomplicated: J45.909

## 2017-07-17 HISTORY — DX: Type 2 diabetes mellitus without complications: E11.9

## 2017-07-17 LAB — GLUCOSE, CAPILLARY: Glucose-Capillary: 110 mg/dL — ABNORMAL HIGH (ref 65–99)

## 2017-07-17 SURGERY — COLONOSCOPY WITH PROPOFOL
Anesthesia: General

## 2017-07-17 MED ORDER — PROPOFOL 500 MG/50ML IV EMUL
INTRAVENOUS | Status: AC
Start: 2017-07-17 — End: 2017-07-17
  Filled 2017-07-17: qty 50

## 2017-07-17 MED ORDER — SODIUM CHLORIDE 0.9 % IV SOLN
INTRAVENOUS | Status: DC
  Administered 2017-07-17: 1000 mL via INTRAVENOUS

## 2017-07-17 MED ORDER — SODIUM CHLORIDE 0.9 % IV SOLN: INTRAVENOUS | Status: DC

## 2017-07-17 MED ORDER — PROPOFOL 10 MG/ML IV BOLUS
INTRAVENOUS | Status: DC | PRN
  Administered 2017-07-17: 370 mg via INTRAVENOUS

## 2017-07-17 MED ORDER — LIDOCAINE HCL (CARDIAC) 20 MG/ML IV SOLN
INTRAVENOUS | Status: DC | PRN
  Administered 2017-07-17: 50 mg via INTRAVENOUS

## 2017-07-17 MED ORDER — LIDOCAINE HCL (PF) 2 % IJ SOLN
INTRAMUSCULAR | Status: AC
  Filled 2017-07-17: qty 10

## 2017-07-17 MED ORDER — LIDOCAINE HCL (PF) 1 % IJ SOLN
2.0000 mL | Freq: Once | INTRAMUSCULAR | Status: AC
  Administered 2017-07-17: 0.3 mL via INTRADERMAL

## 2017-07-17 MED ORDER — LIDOCAINE HCL (PF) 1 % IJ SOLN
INTRAMUSCULAR | Status: AC
  Administered 2017-07-17: 0.3 mL via INTRADERMAL
  Filled 2017-07-17: qty 2

## 2017-07-17 NOTE — Op Note (Signed)
[facility] Patient Name: Karen Stephenson Name Procedure Date: 07/17/2017 7:32 AM MRN: [id number] Account #: [id number] Date of Birth: March 03, 1956 Admit Type: Outpatient Age: 62 Room: [facility] ENDO ROOM 4 Gender: Female Note Status: Finalized Procedure:            Colonoscopy Indications:          Screening for colorectal malignant neoplasm Providers:            Manya Silvas, MD Referring MD:         Janine Ores. Rosanna Randy, MD (Referring MD) Medicines:            Propofol per Anesthesia Complications:        No immediate complications. Procedure:            Pre-Anesthesia Assessment:                       - After reviewing the risks and benefits, the patient                        was deemed in satisfactory condition to undergo the                        procedure.                       After obtaining informed consent, the colonoscope was                        passed under direct vision. Throughout the procedure,                        the patient's blood pressure, pulse, and oxygen                        saturations were monitored continuously. The                        Colonoscope was introduced through the anus and                        advanced to the the cecum, identified by appendiceal                        orifice and ileocecal valve. The colonoscopy was                        somewhat difficult due to a tortuous colon. Successful                        completion of the procedure was aided by applying                        abdominal pressure. The patient tolerated the procedure                        well. The quality of the bowel preparation was                        excellent. Findings:      Internal hemorrhoids were found during endoscopy. The hemorrhoids were  small and Grade I (internal hemorrhoids that do not prolapse).      The exam was otherwise without abnormality. Impression:           - Internal hemorrhoids.      - The examination was otherwise normal.                       - No specimens collected. Recommendation:       - Repeat colonoscopy in 10 years for screening purposes.                       - The findings and recommendations were discussed with                        the patient's family. Procedure Code(s):    --- Professional ---                       [phone], Colonoscopy, flexible; diagnostic, including                        collection of specimen(s) by brushing or washing, when                        performed (separate procedure) Diagnosis Code(s):    --- Professional ---                       Z12.11, Encounter for screening for malignant neoplasm                        of colon                       K64.0, First degree hemorrhoids CPT copyright 2016 American Medical Association. All rights reserved. The codes documented in this report are preliminary and upon coder review may  be revised to meet current compliance requirements. Manya Silvas, MD 07/17/2017 8:04:16 AM This report has been signed electronically. Number of Addenda: 0 Note Initiated On: 07/17/2017 7:32 AM Scope Withdrawal Time: 0 hours 9 minutes 21 seconds  Total Procedure Duration: 0 hours 20 minutes 11 seconds       [facility]

## 2017-07-17 NOTE — Anesthesia Postprocedure Evaluation (Signed)
Anesthesia Post Note  Patient: Karen Stephenson  Procedure(s) Performed: COLONOSCOPY WITH PROPOFOL (N/A )  Patient location during evaluation: PACU Anesthesia Type: General Level of consciousness: awake and alert and oriented Pain management: pain level controlled Vital Signs Assessment: post-procedure vital signs reviewed and stable Respiratory status: spontaneous breathing Cardiovascular status: blood pressure returned to baseline Anesthetic complications: no     Last Vitals:  Vitals:   07/17/17 0820 07/17/17 0830  BP: 128/81 132/82  Pulse: 60 62  Resp: 17 11  Temp:    SpO2: 100% 98%    Last Pain:  Vitals:   07/17/17 0800  TempSrc: Tympanic                 Gari Trovato

## 2017-07-17 NOTE — Anesthesia Post-op Follow-up Note (Signed)
Anesthesia QCDR form completed.        

## 2017-07-17 NOTE — Transfer of Care (Signed)
Immediate Anesthesia Transfer of Care Note  Patient: Karen Stephenson  Procedure(s) Performed: COLONOSCOPY WITH PROPOFOL (N/A )  Patient Location: PACU and Endoscopy Unit  Anesthesia Type:MAC  Level of Consciousness: awake, drowsy and patient cooperative  Airway & Oxygen Therapy: Patient Spontanous Breathing and Patient connected to nasal cannula oxygen  Post-op Assessment: Report given to RN and Post -op Vital signs reviewed and stable  Post vital signs: Reviewed and stable  Last Vitals:  Vitals:   07/17/17 0705  BP: (!) 146/80  Pulse: 69  Resp: 17  Temp: (!) 35.9 C  SpO2: 100%    Last Pain:  Vitals:   07/17/17 0705  TempSrc: Tympanic         Complications: No apparent anesthesia complications

## 2017-07-17 NOTE — Anesthesia Preprocedure Evaluation (Signed)
Anesthesia Evaluation  Patient identified by MRN, date of birth, ID band Patient awake    Reviewed: Allergy & Precautions, NPO status , Patient's Chart, lab work & pertinent test results  Airway Mallampati: III  TM Distance: >3 FB     Dental   Pulmonary asthma , sleep apnea , former smoker,    Pulmonary exam normal        Cardiovascular hypertension, Pt. on medications + angina Normal cardiovascular exam     Neuro/Psych  Neuromuscular disease    GI/Hepatic Neg liver ROS, GERD  Medicated,  Endo/Other  diabetes, Well Controlled, Type 2, Oral Hypoglycemic Agents  Renal/GU negative Renal ROS  negative genitourinary   Musculoskeletal negative musculoskeletal ROS (+)   Abdominal Normal abdominal exam  (+)   Peds negative pediatric ROS (+)  Hematology negative hematology ROS (+)   Anesthesia Other Findings   Reproductive/Obstetrics                             Anesthesia Physical Anesthesia Plan  ASA: III  Anesthesia Plan: General   Post-op Pain Management:    Induction: Intravenous  PONV Risk Score and Plan:   Airway Management Planned: Nasal Cannula  Additional Equipment:   Intra-op Plan:   Post-operative Plan:   Informed Consent: I have reviewed the patients History and Physical, chart, labs and discussed the procedure including the risks, benefits and alternatives for the proposed anesthesia with the patient or authorized representative who has indicated his/her understanding and acceptance.   Dental advisory given  Plan Discussed with: CRNA and Surgeon  Anesthesia Plan Comments:         Anesthesia Quick Evaluation

## 2017-07-17 NOTE — H&P (Signed)
Primary Care Physician:  Jerrol Banana., MD Primary Gastroenterologist:  Dr. Vira Agar  Pre-Procedure History & Physical: HPI:  Karen Stephenson is a 62 y.o. female is here for an colonoscopy.   Past Medical History:  Diagnosis Date  . Anginal pain ([place])   . Asthma   . Diabetes mellitus without complication ([place])   . GERD (gastroesophageal reflux disease)   . Hypercholesteremia   . Hypertension   . Hypokalemia   . Sleep apnea     Past Surgical History:  Procedure Laterality Date  . BACK SURGERY  1986  . NASAL SINUS SURGERY    . UTERINE FIBROID SURGERY      Prior to Admission medications   Medication Sig Start Date End Date Taking? Authorizing Provider  Vitamin D, Ergocalciferol, (DRISDOL) 50000 units CAPS capsule Take 50,000 Units by mouth every 7 (seven) days.   Yes [provider]  acarbose (PRECOSE) 25 MG tablet TAKE 1 TABLET BY MOUTH THREE TIMES DAILY WITH  MEALS 06/07/17   Jerrol Banana., MD  albuterol (PROVENTIL HFA) 108 (90 BASE) MCG/ACT inhaler Inhale 1-2 puffs into the lungs as needed.  12/24/11   [provider]  amoxicillin (AMOXIL) 500 MG capsule Take 1 capsule (500 mg total) by mouth 3 (three) times daily. 06/12/17   Jerrol Banana., MD  BIOTIN PO Take 1 tablet by mouth daily.    [provider]  Cetirizine HCl (ZYRTEC ALLERGY) 10 MG CAPS Take 1 capsule by mouth daily.  04/24/10   [provider]  fluticasone Asencion Islam) 50 MCG/ACT nasal spray USE TWO SPRAY(S) IN EACH NOSTRIL ONCE DAILY 04/25/17   Jerrol Banana., MD  Fluticasone-Salmeterol (ADVAIR DISKUS) 250-50 MCG/DOSE AEPB Inhale 1 puff into the lungs as needed. 01/25/16   Jerrol Banana., MD  glucose blood test strip Check sugar once daily, E 11.9 05/02/16   Jerrol Banana., MD  Homeopathic Products (LEG CRAMP RELIEF PO) Take 2 capsules by mouth daily.     [provider]  hyoscyamine (LEVBID) 0.375 MG 12 hr tablet Take 0.375 mg  by mouth as needed.  03/21/11   [provider]  JANUVIA 100 MG tablet TAKE ONE TABLET BY MOUTH ONCE DAILY 04/22/17   Jerrol Banana., MD  JARDIANCE 10 MG TABS tablet TAKE ONE TABLET BY MOUTH ONCE DAILY 06/24/17   Jerrol Banana., MD  Lancets [facility] ULTRASOFT) lancets Check sugar once daily ,DX E11.9 06/01/16   Jerrol Banana., MD  losartan-hydrochlorothiazide (HYZAAR) 100-25 MG tablet Take 1 tablet by mouth daily. 11/23/16   Jerrol Banana., MD  lovastatin (MEVACOR) 10 MG tablet TAKE ONE TABLET BY MOUTH IN THE EVENING 07/06/16   Jerrol Banana., MD  montelukast (SINGULAIR) 10 MG tablet TAKE ONE TABLET BY MOUTH AT BEDTIME 05/21/17   Jerrol Banana., MD  pantoprazole (PROTONIX) 40 MG tablet TAKE ONE TABLET BY MOUTH ONCE DAILY 05/20/17   Jerrol Banana., MD  vitamin B-12 (CYANOCOBALAMIN) 1000 MCG tablet Take 1,000 mcg by mouth daily.    [provider]  Vitamin D, Ergocalciferol, (DRISDOL) 50000 units CAPS capsule TAKE ONE CAPSULE BY MOUTH ONCE A WEEK 05/06/17   Jerrol Banana., MD    Allergies as of 05/20/2017 - Review Complete 11/14/2016  Allergen Reaction Noted  . Amoxicillin-pot clavulanate  12/13/2014  . Erythromycin  12/13/2014  . Glipizide  12/13/2014  . Metformin hcl  12/13/2014    Family History  Problem Relation Age of Onset  . Diabetes Mother   . Breast cancer Mother 11  . Heart disease Father   . Breast cancer Sister 18       dx twice   . Heart disease Paternal Grandfather   . Hypertension Brother     Social History   Socioeconomic History  . Marital status: Married    Spouse name: Not on file  . Number of children: Not on file  . Years of education: Not on file  . Highest education level: Not on file  Social Needs  . Financial resource strain: Not on file  . Food insecurity - worry: Not on file  . Food insecurity - inability: Not on file  . Transportation needs - medical: Not on file  .  Transportation needs - non-medical: Not on file  Occupational History  . Not on file  Tobacco Use  . Smoking status: Former Smoker    Types: Cigarettes    Last attempt to quit: 07/01/2013    Years since quitting: 4.0  . Smokeless tobacco: Never Used  Substance and Sexual Activity  . Alcohol use: No  . Drug use: No  . Sexual activity: Not on file  Other Topics Concern  . Not on file  Social History Narrative  . Not on file    Review of Systems: See HPI, otherwise negative ROS  Physical Exam: BP (!) 146/80   Pulse 69   Temp (!) 96.7 F (35.9 C) (Tympanic)   Resp 17   Ht 5\' 8"  (1.727 m)   Wt 119.7 kg (264 lb)   SpO2 100%   BMI 40.14 kg/m  General:   Alert,  pleasant and cooperative in NAD Head:  Normocephalic and atraumatic. Neck:  Supple; no masses or thyromegaly. Lungs:  Clear throughout to auscultation.    Heart:  Regular rate and rhythm. Abdomen:  Soft, nontender and nondistended. Normal bowel sounds, without guarding, and without rebound.   Neurologic:  Alert and  oriented x4;  grossly normal neurologically.  Impression/Plan: Karen Stephenson is here for an colonoscopy to be performed for colon cancer screening.  Risks, benefits, limitations, and alternatives regarding  colonoscopy have been reviewed with the patient.  Questions have been answered.  All parties agreeable.   Gaylyn Cheers, MD  07/17/2017, 7:35 AM

## 2017-07-18 ENCOUNTER — Encounter: Payer: Self-pay | Admitting: Unknown Physician Specialty

## 2017-07-24 ENCOUNTER — Encounter: Payer: Self-pay | Admitting: Family Medicine

## 2017-07-24 ENCOUNTER — Ambulatory Visit: Payer: BC Managed Care – PPO | Admitting: Family Medicine

## 2017-07-24 VITALS — BP 122/66 | Temp 97.7°F | Resp 16 | Wt 267.0 lb

## 2017-07-24 DIAGNOSIS — E119 Type 2 diabetes mellitus without complications: Secondary | ICD-10-CM | POA: Diagnosis not present

## 2017-07-24 DIAGNOSIS — E7800 Pure hypercholesterolemia, unspecified: Secondary | ICD-10-CM

## 2017-07-24 DIAGNOSIS — I10 Essential (primary) hypertension: Secondary | ICD-10-CM

## 2017-07-24 DIAGNOSIS — R350 Frequency of micturition: Secondary | ICD-10-CM | POA: Diagnosis not present

## 2017-07-24 LAB — POCT GLYCOSYLATED HEMOGLOBIN (HGB A1C)
Est. average glucose Bld gHb Est-mCnc: 151
Hemoglobin A1C: 6.9

## 2017-07-24 LAB — POCT URINALYSIS DIPSTICK
Bilirubin, UA: NEGATIVE
Glucose, UA: 2000
Ketones, UA: NEGATIVE
Nitrite, UA: POSITIVE
Spec Grav, UA: 1.02 (ref 1.010–1.025)
Urobilinogen, UA: 0.2 E.U./dL
pH, UA: 6.5 (ref 5.0–8.0)

## 2017-07-24 MED ORDER — NITROFURANTOIN MACROCRYSTAL 100 MG PO CAPS: 100.0000 mg | ORAL_CAPSULE | Freq: Two times a day (BID) | ORAL | 0 refills | Status: DC

## 2017-07-24 MED ORDER — HYOSCYAMINE SULFATE ER 0.375 MG PO TB12: 0.3750 mg | ORAL_TABLET | ORAL | 5 refills | Status: DC | PRN

## 2017-07-24 NOTE — Progress Notes (Signed)
Patient: Karen Stephenson Female    DOB: May 29, 1956   62 y.o.   MRN: [id number] Visit Date: 07/24/2017  Today's Provider: [name], MD   Chief Complaint  Patient presents with  . Diabetes  . Hyperlipidemia  . Hypertension  . Urinary Frequency   Subjective:    HPI  Diabetes Mellitus Type II, Follow-up:   Lab Results  Component Value Date   HGBA1C 6.9 07/24/2017   HGBA1C 6.5 03/20/2017   HGBA1C 7.4 11/14/2016   Last seen for diabetes 4 months ago.  Management since then includes no changes. She reports good compliance with treatment. She is not having side effects.  Current symptoms include none and have been stable. Home blood sugar records: trend: stable  Episodes of hypoglycemia? no   Current Insulin Regimen: none Most Recent Eye Exam: up to date Weight trend: stable Prior visit with dietician: no Current diet: well balanced Current exercise: no regular exercise     Hypertension, follow-up:  BP Readings from Last 3 Encounters:  07/24/17 122/66  07/17/17 132/82  06/12/17 110/66    She was last seen for hypertension 4 months ago.  BP at that visit was 112/64. Management since that visit includes no changes.She reports good compliance with treatment. She is not having side effects.  She is not exercising. She is adherent to low salt diet.   Outside blood pressures are checked occasionally. She is experiencing none.  Patient denies exertional chest pressure/discomfort, lower extremity edema and palpitations.   Cardiovascular risk factors include diabetes mellitus and dyslipidemia.     Lipid/Cholesterol, Follow-up:   Last seen for this 4 months ago.  Management since that visit includes no changes.  Last Lipid Panel:    Component Value Date/Time   CHOL 161 11/06/2016 0927   TRIG 122 11/06/2016 0927   HDL 32 (L) 11/06/2016 0927   CHOLHDL 5.0 (H) 11/06/2016 0927   LDLCALC 105 (H) 11/06/2016 8416    She reports good compliance  with treatment. She is not having side effects.   Wt Readings from Last 3 Encounters:  07/24/17 267 lb (121.1 kg)  07/17/17 264 lb (119.7 kg)  06/12/17 265 lb 6.4 oz (120.4 kg)    Urinary frequency: Patient reports that she has had urinary frequency X 1 week. She denies hematuria. She also mentions that it is slightly painful when urinating.       Allergies  Allergen Reactions  . Amoxicillin-Pot Clavulanate     no further information given.  . Aspirin   . Ducodyl [Bisacodyl]   . Erythromycin     Other reaction(s): Stomach Ache Stomach pain.  . Glipizide   . Metformin Hcl      Current Outpatient Medications:  .  acarbose (PRECOSE) 25 MG tablet, TAKE 1 TABLET BY MOUTH THREE TIMES DAILY WITH  MEALS, Disp: 90 tablet, Rfl: 3 .  albuterol (PROVENTIL HFA) 108 (90 BASE) MCG/ACT inhaler, Inhale 1-2 puffs into the lungs as needed. , Disp: , Rfl:  .  amoxicillin (AMOXIL) 500 MG capsule, Take 1 capsule (500 mg total) by mouth 3 (three) times daily., Disp: 30 capsule, Rfl: 0 .  BIOTIN PO, Take 1 tablet by mouth daily., Disp: , Rfl:  .  Cetirizine HCl (ZYRTEC ALLERGY) 10 MG CAPS, Take 1 capsule by mouth daily. , Disp: , Rfl:  .  fluticasone (FLONASE) 50 MCG/ACT nasal spray, USE TWO SPRAY(S) IN EACH NOSTRIL ONCE DAILY, Disp: 16 g, Rfl: 11 .  Fluticasone-Salmeterol (  ADVAIR DISKUS) 250-50 MCG/DOSE AEPB, Inhale 1 puff into the lungs as needed., Disp: 60 each, Rfl: 12 .  glucose blood test strip, Check sugar once daily, E 11.9, Disp: 25 each, Rfl: 12 .  Homeopathic Products (LEG CRAMP RELIEF PO), Take 2 capsules by mouth daily. , Disp: , Rfl:  .  hyoscyamine (LEVBID) 0.375 MG 12 hr tablet, Take 0.375 mg by mouth as needed. , Disp: , Rfl:  .  JANUVIA 100 MG tablet, TAKE ONE TABLET BY MOUTH ONCE DAILY, Disp: 90 tablet, Rfl: 3 .  JARDIANCE 10 MG TABS tablet, TAKE ONE TABLET BY MOUTH ONCE DAILY, Disp: 90 tablet, Rfl: 3 .  Lancets (ONETOUCH ULTRASOFT) lancets, Check sugar once daily ,DX E11.9, Disp:  30 each, Rfl: 12 .  losartan-hydrochlorothiazide (HYZAAR) 100-25 MG tablet, Take 1 tablet by mouth daily., Disp: 90 tablet, Rfl: 3 .  lovastatin (MEVACOR) 10 MG tablet, TAKE ONE TABLET BY MOUTH IN THE EVENING, Disp: 90 tablet, Rfl: 3 .  montelukast (SINGULAIR) 10 MG tablet, TAKE ONE TABLET BY MOUTH AT BEDTIME, Disp: 90 tablet, Rfl: 3 .  pantoprazole (PROTONIX) 40 MG tablet, TAKE ONE TABLET BY MOUTH ONCE DAILY, Disp: 90 tablet, Rfl: 3 .  vitamin B-12 (CYANOCOBALAMIN) 1000 MCG tablet, Take 1,000 mcg by mouth daily., Disp: , Rfl:  .  Vitamin D, Ergocalciferol, (DRISDOL) 50000 units CAPS capsule, TAKE ONE CAPSULE BY MOUTH ONCE A WEEK, Disp: 4 capsule, Rfl: 12 .  Vitamin D, Ergocalciferol, (DRISDOL) 50000 units CAPS capsule, Take 50,000 Units by mouth every 7 (seven) days., Disp: , Rfl:   Review of Systems  Constitutional: Negative.   Respiratory: Negative.   Cardiovascular: Negative.   Endocrine: Negative.   Genitourinary: Positive for difficulty urinating, flank pain, frequency and urgency. Negative for hematuria.  Neurological: Negative.   Psychiatric/Behavioral: Negative.     Social History   Tobacco Use  . Smoking status: Former Smoker    Types: Cigarettes    Last attempt to quit: 07/01/2013    Years since quitting: 4.0  . Smokeless tobacco: Never Used  Substance Use Topics  . Alcohol use: No   Objective:   BP 122/66 (BP Location: Right Arm, Patient Position: Sitting, Cuff Size: Normal)   Temp 97.7 F (36.5 C)   Resp 16   Wt 267 lb (121.1 kg)   BMI 40.60 kg/m  Vitals:   07/24/17 1609  BP: 122/66  Resp: 16  Temp: 97.7 F (36.5 C)  Weight: 267 lb (121.1 kg)     Physical Exam  Constitutional: She is oriented to person, place, and time. She appears well-developed and well-nourished.  HENT:  Head: Normocephalic and atraumatic.  Eyes: Conjunctivae are normal.  Neck: No thyromegaly present.  Cardiovascular: Normal rate, regular rhythm and normal heart sounds.    Pulmonary/Chest: Effort normal and breath sounds normal.  Abdominal: Soft.  Neurological: She is alert and oriented to person, place, and time.  Skin: Skin is warm and dry.  Psychiatric: She has a normal mood and affect. Her behavior is normal. Judgment and thought content normal.        Assessment & Plan:     1. Diabetes mellitus without complication ([place])  - POCT glycosylated hemoglobin (Hb A1C)  2. Essential (primary) hypertension   3. Hypercholesteremia   4. Urinary frequency  - POCT urinalysis dipstick - nitrofurantoin (MACRODANTIN) 100 MG capsule; Take 1 capsule (100 mg total) by mouth 2 (two) times daily for 10 days.  Dispense: 20 capsule; Refill: 0 - Urine Culture  I have done the exam and reviewed the above chart and it is accurate to the best of my knowledge. Development worker, community has been used in this note in any air is in the dictation or transcription are unintentional.  [name], MD  [place]

## 2017-07-26 LAB — URINE CULTURE

## 2017-07-29 ENCOUNTER — Other Ambulatory Visit: Payer: Self-pay

## 2017-07-29 ENCOUNTER — Telehealth: Payer: Self-pay

## 2017-07-29 NOTE — Telephone Encounter (Signed)
Pt advised, -Kris Mouton, RMA

## 2017-07-29 NOTE — Telephone Encounter (Signed)
Spoke with patient and she states she took macrobid last week Wednesday evening through Friday evening and stopped- 1/23-1/25 due to severe cramping/abdominal pain, diarrhea. These symptoms resolved since stopping macrobid. Her urinary symptoms are slightly better too but she did not take macrobid full course. Does she need to take something else now? Urine culture came in and is positive for UTI. Please Mackie Pai, RMA

## 2017-07-29 NOTE — Telephone Encounter (Signed)
Patient is returning call. CB# [phone]

## 2017-07-29 NOTE — Telephone Encounter (Signed)
lmtcb-Anastasiya V Hopkins, RMA  

## 2017-07-29 NOTE — Telephone Encounter (Signed)
LMTCB Received fax from after hours nurse from 07/27/2017, patient called in stating she was having diarrhea and cramping after taking Macrobid. Per fax it looks that patient was advised this is usually not an allergic reaction to antibiotic and what to do to help with diarrhea. Want to check with patient on how she is doing, is she taking Macrobid still?-Nesa Distel V Brenn Deziel, RMA

## 2017-07-29 NOTE — Telephone Encounter (Signed)
I think she should be treated.

## 2017-08-02 ENCOUNTER — Ambulatory Visit: Payer: BC Managed Care – PPO | Admitting: Family Medicine

## 2017-08-02 ENCOUNTER — Encounter: Payer: Self-pay | Admitting: Family Medicine

## 2017-08-02 VITALS — BP 142/82 | HR 86 | Temp 98.0°F | Wt 267.8 lb

## 2017-08-02 DIAGNOSIS — J320 Chronic maxillary sinusitis: Secondary | ICD-10-CM

## 2017-08-02 MED ORDER — GUAIFENESIN-CODEINE 100-10 MG/5ML PO SOLN
5.0000 mL | Freq: Three times a day (TID) | ORAL | 0 refills | Status: DC | PRN
Start: 2017-08-02 — End: 2019-12-09

## 2017-08-02 MED ORDER — AMOXICILLIN 875 MG PO TABS: 875.0000 mg | ORAL_TABLET | Freq: Two times a day (BID) | ORAL | 0 refills | Status: DC

## 2017-08-02 NOTE — Progress Notes (Signed)
Patient: Karen Stephenson Female    DOB: [birth year]   62 y.o.   MRN: [id number] Visit Date: 08/02/2017  Today's Provider: Vernie Murders, PA   Chief Complaint  Patient presents with  . URI   Subjective:    URI   This is a new problem. Episode onset: Tuesday night. The problem has been gradually worsening. There has been no fever. Associated symptoms include congestion, coughing, ear pain, sinus pain and a sore throat. Pertinent negatives include no headaches or rhinorrhea. Treatments tried: Advair and liquor  The treatment provided mild relief.   Past Medical History:  Diagnosis Date  . Anginal pain ([place])   . Asthma   . Diabetes mellitus without complication ([place])   . GERD (gastroesophageal reflux disease)   . Hypercholesteremia   . Hypertension   . Hypokalemia   . Sleep apnea    Past Surgical History:  Procedure Laterality Date  . BACK SURGERY  1986  . COLONOSCOPY WITH PROPOFOL N/A 07/17/2017   Procedure: COLONOSCOPY WITH PROPOFOL;  Surgeon: Manya Silvas, MD;  Location: [facility] ENDOSCOPY;  Service: Endoscopy;  Laterality: N/A;  . NASAL SINUS SURGERY    . UTERINE FIBROID SURGERY     Family History  Problem Relation Age of Onset  . Diabetes Mother   . Breast cancer Mother 78  . Heart disease Father   . Breast cancer Sister 11       dx twice   . Heart disease Paternal Grandfather   . Hypertension Brother    Allergies  Allergen Reactions  . Amoxicillin-Pot Clavulanate     no further information given.  . Aspirin   . Ducodyl [Bisacodyl]   . Erythromycin     Other reaction(s): Stomach Ache Stomach pain.  . Glipizide   . Metformin Hcl   . Macrobid [Nitrofurantoin]     Severe diarrhea, severe abdominal pain/cramping    Current Outpatient Medications:  .  acarbose (PRECOSE) 25 MG tablet, TAKE 1 TABLET BY MOUTH THREE TIMES DAILY WITH  MEALS, Disp: 90 tablet, Rfl: 3 .  albuterol (PROVENTIL HFA) 108 (90 BASE) MCG/ACT inhaler, Inhale 1-2 puffs into the lungs as  needed. , Disp: , Rfl:  .  amoxicillin (AMOXIL) 500 MG capsule, Take 1 capsule (500 mg total) by mouth 3 (three) times daily., Disp: 30 capsule, Rfl: 0 .  BIOTIN PO, Take 1 tablet by mouth daily., Disp: , Rfl:  .  Cetirizine HCl (ZYRTEC ALLERGY) 10 MG CAPS, Take 1 capsule by mouth daily. , Disp: , Rfl:  .  fluticasone (FLONASE) 50 MCG/ACT nasal spray, USE TWO SPRAY(S) IN EACH NOSTRIL ONCE DAILY, Disp: 16 g, Rfl: 11 .  Fluticasone-Salmeterol (ADVAIR DISKUS) 250-50 MCG/DOSE AEPB, Inhale 1 puff into the lungs as needed., Disp: 60 each, Rfl: 12 .  glucose blood test strip, Check sugar once daily, E 11.9, Disp: 25 each, Rfl: 12 .  Homeopathic Products (LEG CRAMP RELIEF PO), Take 2 capsules by mouth daily. , Disp: , Rfl:  .  hyoscyamine (LEVBID) 0.375 MG 12 hr tablet, Take 1 tablet (0.375 mg total) by mouth as needed., Disp: 60 tablet, Rfl: 5 .  JANUVIA 100 MG tablet, TAKE ONE TABLET BY MOUTH ONCE DAILY, Disp: 90 tablet, Rfl: 3 .  JARDIANCE 10 MG TABS tablet, TAKE ONE TABLET BY MOUTH ONCE DAILY, Disp: 90 tablet, Rfl: 3 .  Lancets (ONETOUCH ULTRASOFT) lancets, Check sugar once daily ,DX E11.9, Disp: 30 each, Rfl: 12 .  losartan-hydrochlorothiazide (HYZAAR) 100-25  MG tablet, Take 1 tablet by mouth daily., Disp: 90 tablet, Rfl: 3 .  lovastatin (MEVACOR) 10 MG tablet, TAKE ONE TABLET BY MOUTH IN THE EVENING, Disp: 90 tablet, Rfl: 3 .  montelukast (SINGULAIR) 10 MG tablet, TAKE ONE TABLET BY MOUTH AT BEDTIME, Disp: 90 tablet, Rfl: 3 .  pantoprazole (PROTONIX) 40 MG tablet, TAKE ONE TABLET BY MOUTH ONCE DAILY, Disp: 90 tablet, Rfl: 3 .  vitamin B-12 (CYANOCOBALAMIN) 1000 MCG tablet, Take 1,000 mcg by mouth daily., Disp: , Rfl:  .  Vitamin D, Ergocalciferol, (DRISDOL) 50000 units CAPS capsule, TAKE ONE CAPSULE BY MOUTH ONCE A WEEK, Disp: 4 capsule, Rfl: 12 .  Vitamin D, Ergocalciferol, (DRISDOL) 50000 units CAPS capsule, Take 50,000 Units by mouth every 7 (seven) days., Disp: , Rfl:   Review of Systems    Constitutional: Negative.   HENT: Positive for congestion, ear pain, sinus pain and sore throat. Negative for rhinorrhea.   Respiratory: Positive for cough.   Cardiovascular: Negative.   Neurological: Negative for headaches.    Social History   Tobacco Use  . Smoking status: Former Smoker    Types: Cigarettes    Last attempt to quit: 07/01/2013    Years since quitting: 4.0  . Smokeless tobacco: Never Used  Substance Use Topics  . Alcohol use: No   Objective:   BP (!) 142/82 (BP Location: Right Arm, Patient Position: Sitting, Cuff Size: Normal)   Pulse 86   Temp 98 F (36.7 C) (Oral)   Wt 267 lb 12.8 oz (121.5 kg)   SpO2 97%   BMI 40.72 kg/m   Physical Exam  Constitutional: She is oriented to person, place, and time. She appears well-developed and well-nourished. No distress.  HENT:  Head: Normocephalic and atraumatic.  Right Ear: Hearing normal.  Left Ear: Hearing normal.  Nose: Nose normal.  Moderate cerumen bilaterally. Slightly tender right maxillary sinus with no transillumination. Cobblestoning of posterior pharynx without exudates.  Eyes: Conjunctivae and lids are normal. Right eye exhibits no discharge. Left eye exhibits no discharge. No scleral icterus.  Neck: Neck supple.  Cardiovascular: Normal rate and regular rhythm.  Pulmonary/Chest: Effort normal and breath sounds normal. No respiratory distress.  Abdominal: Soft. Bowel sounds are normal.  Musculoskeletal: Normal range of motion.  Lymphadenopathy:    She has no cervical adenopathy.  Neurological: She is alert and oriented to person, place, and time.  Skin: Skin is intact. No lesion and no rash noted.  Psychiatric: She has a normal mood and affect. Her speech is normal and behavior is normal. Thought content normal.      Assessment & Plan:     1. Right maxillary sinusitis Onset with cough, sore throat, PND, right earache and right sinus pressure/congestion. No fever. May use Tylenol or Advil prn.  Given Amoxil (Augmentin causes nausea/stomach upset - no problem with the Amoxil) and Robitussin-AC for cough. Increase fluid intake and recheck prn. - guaiFENesin-codeine 100-10 MG/5ML syrup; Take 5 mLs by mouth 3 (three) times daily as needed for cough.  Dispense: 120 mL; Refill: 0 - amoxicillin (AMOXIL) 875 MG tablet; Take 1 tablet (875 mg total) by mouth 2 (two) times daily.  Dispense: 20 tablet; Refill: [place], PA  [facility]

## 2017-09-13 ENCOUNTER — Other Ambulatory Visit: Payer: Self-pay | Admitting: Family Medicine

## 2017-09-13 DIAGNOSIS — E7800 Pure hypercholesterolemia, unspecified: Secondary | ICD-10-CM

## 2017-09-28 ENCOUNTER — Other Ambulatory Visit: Payer: Self-pay | Admitting: Family Medicine

## 2017-10-09 ENCOUNTER — Ambulatory Visit: Payer: Self-pay | Admitting: Podiatry

## 2017-10-14 ENCOUNTER — Encounter: Payer: Self-pay | Admitting: Podiatry

## 2017-10-14 ENCOUNTER — Ambulatory Visit (INDEPENDENT_AMBULATORY_CARE_PROVIDER_SITE_OTHER): Payer: BC Managed Care – PPO

## 2017-10-14 ENCOUNTER — Ambulatory Visit: Payer: BC Managed Care – PPO | Admitting: Podiatry

## 2017-10-14 DIAGNOSIS — M2042 Other hammer toe(s) (acquired), left foot: Secondary | ICD-10-CM

## 2017-10-14 DIAGNOSIS — M2041 Other hammer toe(s) (acquired), right foot: Secondary | ICD-10-CM

## 2017-10-14 DIAGNOSIS — Q828 Other specified congenital malformations of skin: Secondary | ICD-10-CM | POA: Diagnosis not present

## 2017-10-14 DIAGNOSIS — E1140 Type 2 diabetes mellitus with diabetic neuropathy, unspecified: Secondary | ICD-10-CM | POA: Diagnosis not present

## 2017-10-14 DIAGNOSIS — M79676 Pain in unspecified toe(s): Secondary | ICD-10-CM

## 2017-10-14 DIAGNOSIS — M79671 Pain in right foot: Secondary | ICD-10-CM | POA: Diagnosis not present

## 2017-10-14 DIAGNOSIS — M79672 Pain in left foot: Secondary | ICD-10-CM | POA: Diagnosis not present

## 2017-10-14 DIAGNOSIS — B351 Tinea unguium: Secondary | ICD-10-CM

## 2017-10-14 NOTE — Progress Notes (Signed)
Subjective:  Patient ID: Karen Stephenson, female    DOB: [birth year],  MRN: [id number] HPI Chief Complaint  Patient presents with  . Foot Pain    Sub 5th MPJ right - callused area x several months, used medicated corn pad, burned skin, now area is really sore and dark  . Toe Pain    Hallux bilateral - medial sides - thick callused areas  . Nail Problem    Hallux right - thick, dark nail x months  . Diabetes    Last a1c was 6.9  . New Patient (Initial Visit)    62 y.o. female presents with the above complaint.   ROS: Denies fever chills nausea vomiting muscle aches pains chest pain back pain headache.  Past Medical History:  Diagnosis Date  . Anginal pain ([place])   . Asthma   . Diabetes mellitus without complication ([place])   . GERD (gastroesophageal reflux disease)   . Hypercholesteremia   . Hypertension   . Hypokalemia   . Sleep apnea    Past Surgical History:  Procedure Laterality Date  . BACK SURGERY  1986  . COLONOSCOPY WITH PROPOFOL N/A 07/17/2017   Procedure: COLONOSCOPY WITH PROPOFOL;  Surgeon: Manya Silvas, MD;  Location: [facility] ENDOSCOPY;  Service: Endoscopy;  Laterality: N/A;  . NASAL SINUS SURGERY    . UTERINE FIBROID SURGERY      Current Outpatient Medications:  .  acarbose (PRECOSE) 25 MG tablet, TAKE 1 TABLET BY MOUTH THREE TIMES DAILY WITH  MEALS, Disp: 90 tablet, Rfl: 3 .  albuterol (PROVENTIL HFA) 108 (90 BASE) MCG/ACT inhaler, Inhale 1-2 puffs into the lungs as needed. , Disp: , Rfl:  .  amoxicillin (AMOXIL) 875 MG tablet, Take 1 tablet (875 mg total) by mouth 2 (two) times daily., Disp: 20 tablet, Rfl: 0 .  BIOTIN PO, Take 1 tablet by mouth daily., Disp: , Rfl:  .  Cetirizine HCl (ZYRTEC ALLERGY) 10 MG CAPS, Take 1 capsule by mouth daily. , Disp: , Rfl:  .  fluticasone (FLONASE) 50 MCG/ACT nasal spray, USE TWO SPRAY(S) IN EACH NOSTRIL ONCE DAILY, Disp: 16 g, Rfl: 11 .  Fluticasone-Salmeterol (ADVAIR) 250-50 MCG/DOSE AEPB, INHALE ONE DOSE BY MOUTH AS  NEEDED, Disp: 60 each, Rfl: 12 .  glucose blood test strip, Check sugar once daily, E 11.9, Disp: 25 each, Rfl: 12 .  guaiFENesin-codeine 100-10 MG/5ML syrup, Take 5 mLs by mouth 3 (three) times daily as needed for cough., Disp: 120 mL, Rfl: 0 .  Homeopathic Products (LEG CRAMP RELIEF PO), Take 2 capsules by mouth daily. , Disp: , Rfl:  .  hyoscyamine (LEVBID) 0.375 MG 12 hr tablet, Take 1 tablet (0.375 mg total) by mouth as needed., Disp: 60 tablet, Rfl: 5 .  JANUVIA 100 MG tablet, TAKE ONE TABLET BY MOUTH ONCE DAILY, Disp: 90 tablet, Rfl: 3 .  JARDIANCE 10 MG TABS tablet, TAKE ONE TABLET BY MOUTH ONCE DAILY, Disp: 90 tablet, Rfl: 3 .  Lancets (ONETOUCH ULTRASOFT) lancets, Check sugar once daily ,DX E11.9, Disp: 30 each, Rfl: 12 .  losartan-hydrochlorothiazide (HYZAAR) 100-25 MG tablet, Take 1 tablet by mouth daily., Disp: 90 tablet, Rfl: 3 .  lovastatin (MEVACOR) 10 MG tablet, TAKE ONE TABLET BY MOUTH IN THE EVENING, Disp: 90 tablet, Rfl: 3 .  montelukast (SINGULAIR) 10 MG tablet, TAKE ONE TABLET BY MOUTH AT BEDTIME, Disp: 90 tablet, Rfl: 3 .  pantoprazole (PROTONIX) 40 MG tablet, TAKE ONE TABLET BY MOUTH ONCE DAILY, Disp: 90 tablet, Rfl: 3 .  vitamin B-12 (CYANOCOBALAMIN) 1000 MCG tablet, Take 1,000 mcg by mouth daily., Disp: , Rfl:  .  Vitamin D, Ergocalciferol, (DRISDOL) 50000 units CAPS capsule, TAKE ONE CAPSULE BY MOUTH ONCE A WEEK, Disp: 4 capsule, Rfl: 12 .  Vitamin D, Ergocalciferol, (DRISDOL) 50000 units CAPS capsule, Take 50,000 Units by mouth every 7 (seven) days., Disp: , Rfl:   Allergies  Allergen Reactions  . Amoxicillin-Pot Clavulanate     no further information given.  . Aspirin   . Ducodyl [Bisacodyl]   . Erythromycin     Other reaction(s): Stomach Ache Stomach pain.  . Glipizide   . Metformin Hcl   . Macrobid [Nitrofurantoin]     Severe diarrhea, severe abdominal pain/cramping   Review of Systems Objective:  There were no vitals filed for this visit.  General:  Well developed, nourished, in no acute distress, alert and oriented x3   Dermatological: Skin is warm, dry and supple bilateral. Nails x 10 are well maintained; remaining integument appears unremarkable at this time. There are no open sores, no preulcerative lesions, no rash or signs of infection present.  Vascular: Dorsalis Pedis artery and Posterior Tibial artery pedal pulses are 2/4 bilateral with immedate capillary fill time. Pedal hair growth present. No varicosities and no lower extremity edema present bilateral.   Neruologic: Grossly intact via light touch bilateral. Vibratory intact via tuning fork bilateral.  Protective threshold per Semmes Weinstein monofilament is not intact to the level of the midfoot.  Sharp dull sensation is diminished to the level of the midfoot.  Patellar and Achilles deep tendon reflexes 2+ bilateral. No Babinski or clonus noted bilateral.   Musculoskeletal: No gross boney pedal deformities bilateral. No pain, crepitus, or limitation noted with foot and ankle range of motion bilateral. Muscular strength 5/5 in all groups tested bilateral.  Gait: Unassisted, Nonantalgic.    Radiographs:  Radiographs taken today demonstrate no acute findings.  Assessment & Plan:   Assessment: Moderate diabetic peripheral neuropathy with severe callusing medial aspect of the hallux and fifth metatarsals bilateral.  Painful elongated toenails.  Plan: Debrided all reactive hyperkeratosis today.  Debrided toenails 1 through 5 bilateral.  Discussed appropriate shoe gear with her.  At this point the majority of her calluses are caused by shoe gear that is not the right size which she more than likely cannot feel because of the neuropathy.     Max T. [place], [place]

## 2017-10-15 ENCOUNTER — Ambulatory Visit: Payer: Self-pay | Admitting: Podiatry

## 2017-10-21 ENCOUNTER — Other Ambulatory Visit: Payer: Self-pay | Admitting: Family Medicine

## 2017-10-21 DIAGNOSIS — E119 Type 2 diabetes mellitus without complications: Secondary | ICD-10-CM

## 2017-11-09 ENCOUNTER — Other Ambulatory Visit: Payer: Self-pay | Admitting: Family Medicine

## 2017-11-09 DIAGNOSIS — I10 Essential (primary) hypertension: Secondary | ICD-10-CM

## 2017-11-20 ENCOUNTER — Encounter: Payer: Self-pay | Admitting: Family Medicine

## 2017-11-20 ENCOUNTER — Ambulatory Visit: Payer: BC Managed Care – PPO | Admitting: Family Medicine

## 2017-11-20 VITALS — BP 124/78 | Temp 98.6°F | Resp 16 | Ht 68.0 in | Wt 265.0 lb

## 2017-11-20 DIAGNOSIS — I10 Essential (primary) hypertension: Secondary | ICD-10-CM

## 2017-11-20 DIAGNOSIS — E119 Type 2 diabetes mellitus without complications: Secondary | ICD-10-CM | POA: Diagnosis not present

## 2017-11-20 LAB — POCT GLYCOSYLATED HEMOGLOBIN (HGB A1C)
Est. average glucose Bld gHb Est-mCnc: 157
Hemoglobin A1C: 7.1 % — AB (ref 4.0–5.6)

## 2017-11-20 LAB — POCT UA - MICROALBUMIN: Microalbumin Ur, POC: 20 mg/L

## 2017-11-20 NOTE — Progress Notes (Signed)
Patient: Karen Stephenson Female    DOB: 1956/05/26   62 y.o.   MRN: [id number] Visit Date: 11/20/2017  Today's Provider: [name], MD   Chief Complaint  Patient presents with  . Diabetes   Subjective:    HPI  Diabetes Mellitus Type II, Follow-up:   Lab Results  Component Value Date   HGBA1C 7.1 (A) 11/20/2017   HGBA1C 6.9 07/24/2017   HGBA1C 6.5 03/20/2017    Last seen for diabetes 4 months ago.  Management since then includes no changes. She reports good compliance with treatment. She is not having side effects.  Current symptoms include none and have been stable. Home blood sugar records: stable  Episodes of hypoglycemia? no   Current Insulin Regimen: none Most Recent Eye Exam: due Weight trend: stable Prior visit with dietician: no Current diet: well balanced Current exercise: no regular exercise  Pertinent Labs:    Component Value Date/Time   CHOL 161 11/06/2016 0927   TRIG 122 11/06/2016 0927   HDL 32 (L) 11/06/2016 0927   LDLCALC 105 (H) 11/06/2016 0927   CREATININE 0.82 11/06/2016 0927    Wt Readings from Last 3 Encounters:  11/20/17 265 lb (120.2 kg)  08/02/17 267 lb 12.8 oz (121.5 kg)  07/24/17 267 lb (121.1 kg)        Allergies  Allergen Reactions  . Amoxicillin-Pot Clavulanate     no further information given.  . Aspirin   . Ducodyl [Bisacodyl]   . Erythromycin     Other reaction(s): Stomach Ache Stomach pain.  . Glipizide   . Metformin Hcl   . Macrobid [Nitrofurantoin]     Severe diarrhea, severe abdominal pain/cramping     Current Outpatient Medications:  .  acarbose (PRECOSE) 25 MG tablet, TAKE 1 TABLET BY MOUTH THREE TIMES DAILY WITH MEALS, Disp: 90 tablet, Rfl: 3 .  albuterol (PROVENTIL HFA) 108 (90 BASE) MCG/ACT inhaler, Inhale 1-2 puffs into the lungs as needed. , Disp: , Rfl:  .  BIOTIN PO, Take 1 tablet by mouth daily., Disp: , Rfl:  .  Cetirizine HCl (ZYRTEC ALLERGY) 10 MG CAPS, Take 1 capsule by mouth  daily. , Disp: , Rfl:  .  fluticasone (FLONASE) 50 MCG/ACT nasal spray, USE TWO SPRAY(S) IN EACH NOSTRIL ONCE DAILY, Disp: 16 g, Rfl: 11 .  Fluticasone-Salmeterol (ADVAIR) 250-50 MCG/DOSE AEPB, INHALE ONE DOSE BY MOUTH AS NEEDED, Disp: 60 each, Rfl: 12 .  glucose blood test strip, Check sugar once daily, E 11.9, Disp: 25 each, Rfl: 12 .  Homeopathic Products (LEG CRAMP RELIEF PO), Take 2 capsules by mouth daily. , Disp: , Rfl:  .  hyoscyamine (LEVBID) 0.375 MG 12 hr tablet, Take 1 tablet (0.375 mg total) by mouth as needed., Disp: 60 tablet, Rfl: 5 .  JANUVIA 100 MG tablet, TAKE ONE TABLET BY MOUTH ONCE DAILY, Disp: 90 tablet, Rfl: 3 .  JARDIANCE 10 MG TABS tablet, TAKE ONE TABLET BY MOUTH ONCE DAILY, Disp: 90 tablet, Rfl: 3 .  Lancets (ONETOUCH ULTRASOFT) lancets, Check sugar once daily ,DX E11.9, Disp: 30 each, Rfl: 12 .  losartan-hydrochlorothiazide (HYZAAR) 100-25 MG tablet, TAKE 1 TABLET BY MOUTH ONCE DAILY, Disp: 90 tablet, Rfl: 3 .  amoxicillin (AMOXIL) 875 MG tablet, Take 1 tablet (875 mg total) by mouth 2 (two) times daily. (Patient not taking: Reported on 11/20/2017), Disp: 20 tablet, Rfl: 0 .  guaiFENesin-codeine 100-10 MG/5ML syrup, Take 5 mLs by mouth 3 (three) times daily  as needed for cough. (Patient not taking: Reported on 11/20/2017), Disp: 120 mL, Rfl: 0 .  lovastatin (MEVACOR) 10 MG tablet, TAKE ONE TABLET BY MOUTH IN THE EVENING, Disp: 90 tablet, Rfl: 3 .  montelukast (SINGULAIR) 10 MG tablet, TAKE ONE TABLET BY MOUTH AT BEDTIME, Disp: 90 tablet, Rfl: 3 .  pantoprazole (PROTONIX) 40 MG tablet, TAKE ONE TABLET BY MOUTH ONCE DAILY, Disp: 90 tablet, Rfl: 3 .  vitamin B-12 (CYANOCOBALAMIN) 1000 MCG tablet, Take 1,000 mcg by mouth daily., Disp: , Rfl:  .  Vitamin D, Ergocalciferol, (DRISDOL) 50000 units CAPS capsule, TAKE ONE CAPSULE BY MOUTH ONCE A WEEK, Disp: 4 capsule, Rfl: 12 .  Vitamin D, Ergocalciferol, (DRISDOL) 50000 units CAPS capsule, Take 50,000 Units by mouth every 7 (seven)  days., Disp: , Rfl:   Review of Systems  Constitutional: Negative for activity change, appetite change, chills, diaphoresis, fatigue, fever and unexpected weight change.  HENT: Negative.   Eyes: Negative.   Respiratory: Negative.   Cardiovascular: Negative for chest pain, palpitations and leg swelling.  Endocrine: Negative for cold intolerance, heat intolerance, polydipsia, polyphagia and polyuria.  Genitourinary: Negative.   Musculoskeletal: Negative for arthralgias, joint swelling and myalgias.  Allergic/Immunologic: Negative.   Neurological: Negative for dizziness and headaches.  Psychiatric/Behavioral: Negative.     Social History   Tobacco Use  . Smoking status: Former Smoker    Types: Cigarettes    Last attempt to quit: 07/01/2013    Years since quitting: 4.3  . Smokeless tobacco: Never Used  Substance Use Topics  . Alcohol use: No   Objective:   BP 124/78 (BP Location: Right Arm, Patient Position: Sitting, Cuff Size: Large)   Temp 98.6 F (37 C)   Resp 16   Ht 5\' 8"  (1.727 m)   Wt 265 lb (120.2 kg)   BMI 40.29 kg/m  Vitals:   11/20/17 1344  BP: 124/78  Resp: 16  Temp: 98.6 F (37 C)  Weight: 265 lb (120.2 kg)  Height: 5\' 8"  (1.727 m)     Physical Exam  Constitutional: She is oriented to person, place, and time. She appears well-developed and well-nourished.  HENT:  Head: Normocephalic and atraumatic.  Eyes: Conjunctivae are normal. No scleral icterus.  Neck: No thyromegaly present.  Cardiovascular: Normal rate, regular rhythm and normal heart sounds.  Pulmonary/Chest: Effort normal and breath sounds normal.  Abdominal: Soft.  Musculoskeletal: She exhibits no edema.  Neurological: She is alert and oriented to person, place, and time.  Skin: Skin is warm and dry.  Psychiatric: She has a normal mood and affect. Her behavior is normal. Judgment and thought content normal.        Assessment & Plan:     1. Diabetes mellitus without complication  (HCC) Stable. HgbA1c was 7.1 today. F/U in 3 months for CPE.  - POCT UA - Microalbumin - POCT glycosylated hemoglobin (Hb A1C)  2.HTN Microalbumin 20 today.     I have done the exam and reviewed the chart and it is accurate to the best of my knowledge. Development worker, community has been used and  any errors in dictation or transcription are unintentional. Miguel Aschoff M.D. [place], MD  [facility]

## 2018-01-15 ENCOUNTER — Encounter: Payer: Self-pay | Admitting: Podiatry

## 2018-01-15 ENCOUNTER — Ambulatory Visit: Payer: BC Managed Care – PPO | Admitting: Podiatry

## 2018-01-15 DIAGNOSIS — E1140 Type 2 diabetes mellitus with diabetic neuropathy, unspecified: Secondary | ICD-10-CM | POA: Diagnosis not present

## 2018-01-15 DIAGNOSIS — Q828 Other specified congenital malformations of skin: Secondary | ICD-10-CM | POA: Diagnosis not present

## 2018-01-15 DIAGNOSIS — L84 Corns and callosities: Secondary | ICD-10-CM

## 2018-01-15 NOTE — Progress Notes (Signed)
She presents today for follow-up of calluses to the medial aspect of the bilateral foot.  She states that everything is doing well nothing is open nothing is infected.  Objective: Vital signs are stable alert and oriented x3.  Pulses are palpable.  Reactive hyper keratomas medial aspect of the hallux interphalangeal joints was debrided demonstrates a pre-ulcerative lesion.  Assessment: Pre-ulcerative lesions debrided today.  Plan: Follow-up with me in about 3 months debrided all reactive hyperkeratosis for her.

## 2018-01-22 ENCOUNTER — Other Ambulatory Visit: Payer: Self-pay | Admitting: Family Medicine

## 2018-01-22 DIAGNOSIS — E119 Type 2 diabetes mellitus without complications: Secondary | ICD-10-CM

## 2018-01-22 MED ORDER — ACARBOSE 25 MG PO TABS: 25.0000 mg | ORAL_TABLET | Freq: Three times a day (TID) | ORAL | 3 refills | Status: DC

## 2018-01-22 NOTE — Telephone Encounter (Signed)
[facility] faxed a refill request for the following medication. Thanks [facility]  acarbose (PRECOSE) 25 MG tablet

## 2018-01-22 NOTE — Telephone Encounter (Signed)
Rx sent to pharmacy   

## 2018-02-26 ENCOUNTER — Encounter: Payer: Self-pay | Admitting: Family Medicine

## 2018-03-26 ENCOUNTER — Encounter: Payer: Self-pay | Admitting: Family Medicine

## 2018-03-26 ENCOUNTER — Ambulatory Visit (INDEPENDENT_AMBULATORY_CARE_PROVIDER_SITE_OTHER): Payer: BC Managed Care – PPO | Admitting: Family Medicine

## 2018-03-26 VITALS — BP 130/78 | HR 63 | Temp 98.5°F | Resp 16 | Ht 68.0 in | Wt 263.8 lb

## 2018-03-26 DIAGNOSIS — E119 Type 2 diabetes mellitus without complications: Secondary | ICD-10-CM | POA: Diagnosis not present

## 2018-03-26 DIAGNOSIS — I10 Essential (primary) hypertension: Secondary | ICD-10-CM

## 2018-03-26 DIAGNOSIS — Z1239 Encounter for other screening for malignant neoplasm of breast: Secondary | ICD-10-CM

## 2018-03-26 DIAGNOSIS — Z23 Encounter for immunization: Secondary | ICD-10-CM | POA: Diagnosis not present

## 2018-03-26 DIAGNOSIS — E7800 Pure hypercholesterolemia, unspecified: Secondary | ICD-10-CM

## 2018-03-26 DIAGNOSIS — Z0000 Encounter for general adult medical examination without abnormal findings: Secondary | ICD-10-CM

## 2018-03-26 DIAGNOSIS — Z1231 Encounter for screening mammogram for malignant neoplasm of breast: Secondary | ICD-10-CM | POA: Diagnosis not present

## 2018-03-26 NOTE — Progress Notes (Signed)
Patient: Karen Stephenson, Female    DOB: October 28, 1955, 62 y.o.   MRN: [id number] Visit Date: 03/26/2018  Today's Provider: [name], MD   Chief Complaint  Patient presents with  . Annual Exam   Subjective:    Annual physical exam Karen Stephenson is a 62 y.o. female who presents today for health maintenance and complete physical. She feels well. She reports exercising walking daily. She reports she is sleeping well. Married for 22 years,4 [place]. Colonoscopy- 07/17/2017. Repeat in 10 years. Mammogram- 04/03/2017. Normal.  Pap- 07/15/2014. Normal. Tdap- 04/18/2011.    Review of Systems  Constitutional: Negative.   HENT: Positive for nosebleeds and sinus pain.   Eyes: Negative.   Respiratory: Positive for apnea.   Cardiovascular: Negative.   Gastrointestinal: Positive for constipation.  Endocrine: Negative.   Genitourinary: Negative.   Musculoskeletal: Positive for arthralgias.  Neurological: Positive for numbness.  Hematological: Negative.   Psychiatric/Behavioral: Negative.     Social History      She  reports that she quit smoking about 4 years ago. Her smoking use included cigarettes. She has never used smokeless tobacco. She reports that she does not drink alcohol or use drugs.       Social History   Socioeconomic History  . Marital status: Married    Spouse name: Not on file  . Number of children: Not on file  . Years of education: Not on file  . Highest education level: Not on file  Occupational History  . Not on file  Social Needs  . Financial resource strain: Not on file  . Food insecurity:    Worry: Not on file    Inability: Not on file  . Transportation needs:    Medical: Not on file    Non-medical: Not on file  Tobacco Use  . Smoking status: Former Smoker    Types: Cigarettes    Last attempt to quit: 07/01/2013    Years since quitting: 4.7  . Smokeless tobacco: Never Used  Substance and Sexual Activity  . Alcohol use:  No  . Drug use: No  . Sexual activity: Not on file  Lifestyle  . Physical activity:    Days per week: Not on file    Minutes per session: Not on file  . Stress: Not on file  Relationships  . Social connections:    Talks on phone: Not on file    Gets together: Not on file    Attends religious service: Not on file    Active member of club or organization: Not on file    Attends meetings of clubs or organizations: Not on file    Relationship status: Not on file  Other Topics Concern  . Not on file  Social History Narrative  . Not on file    Past Medical History:  Diagnosis Date  . Anginal pain ([place])   . Asthma   . Diabetes mellitus without complication ([place])   . GERD (gastroesophageal reflux disease)   . Hypercholesteremia   . Hypertension   . Hypokalemia   . Sleep apnea      Patient Active Problem List   Diagnosis Date Noted  . Diabetes mellitus without complication ([place]) 22/08/5425  . Airway hyperreactivity 12/13/2014  . Acid reflux 12/13/2014  . Hypercholesteremia 12/13/2014  . Decreased potassium in the blood 12/13/2014  . Cramps of lower extremity 12/13/2014  . Extreme obesity 12/13/2014  . Avitaminosis D 12/13/2014  . Dermatophytic onychia 12/24/2013  .  Bony exostosis 12/24/2013  . Unstable angina pectoris ([place]) 12/24/2013  . Polyneuropathy 12/24/2013  . Family history of breast cancer 03/01/2008  . Obstructive apnea 12/26/2007  . Diaphragmatic hernia 10/08/2006  . Allergic rhinitis, seasonal 10/08/2006  . Diabetes mellitus, type 2 ([place]) 10/08/2006  . Essential (primary) hypertension 12/29/1999    Past Surgical History:  Procedure Laterality Date  . BACK SURGERY  1986  . COLONOSCOPY WITH PROPOFOL N/A 07/17/2017   Procedure: COLONOSCOPY WITH PROPOFOL;  Surgeon: Manya Silvas, MD;  Location: [facility] ENDOSCOPY;  Service: Endoscopy;  Laterality: N/A;  . NASAL SINUS SURGERY    . UTERINE FIBROID SURGERY      Family History        Family Status    Relation Name Status  . Mother  Deceased  . Father  Deceased at age 65  . Sister  Alive  . PGF  (Not Specified)  . Brother  Alive        Her family history includes Breast cancer (age of onset: 55) in her mother; Breast cancer (age of onset: 80) in her sister; Diabetes in her mother; Heart disease in her father and paternal grandfather; Hypertension in her brother.      Allergies  Allergen Reactions  . Amoxicillin-Pot Clavulanate     no further information given.  . Aspirin   . Ducodyl [Bisacodyl]   . Erythromycin     Other reaction(s): Stomach Ache Stomach pain.  . Glipizide   . Metformin Hcl   . Macrobid [Nitrofurantoin]     Severe diarrhea, severe abdominal pain/cramping     Current Outpatient Medications:  .  acarbose (PRECOSE) 25 MG tablet, Take 1 tablet (25 mg total) by mouth 3 (three) times daily with meals., Disp: 90 tablet, Rfl: 3 .  BIOTIN PO, Take 1 tablet by mouth daily., Disp: , Rfl:  .  Cetirizine HCl (ZYRTEC ALLERGY) 10 MG CAPS, Take 1 capsule by mouth daily. , Disp: , Rfl:  .  fluticasone (FLONASE) 50 MCG/ACT nasal spray, USE TWO SPRAY(S) IN EACH NOSTRIL ONCE DAILY, Disp: 16 g, Rfl: 11 .  Fluticasone-Salmeterol (ADVAIR) 250-50 MCG/DOSE AEPB, INHALE ONE DOSE BY MOUTH AS NEEDED, Disp: 60 each, Rfl: 12 .  glucose blood test strip, Check sugar once daily, E 11.9, Disp: 25 each, Rfl: 12 .  guaiFENesin-codeine 100-10 MG/5ML syrup, Take 5 mLs by mouth 3 (three) times daily as needed for cough., Disp: 120 mL, Rfl: 0 .  Homeopathic Products (LEG CRAMP RELIEF PO), Take 2 capsules by mouth daily. , Disp: , Rfl:  .  hyoscyamine (LEVBID) 0.375 MG 12 hr tablet, Take 1 tablet (0.375 mg total) by mouth as needed., Disp: 60 tablet, Rfl: 5 .  JANUVIA 100 MG tablet, TAKE ONE TABLET BY MOUTH ONCE DAILY, Disp: 90 tablet, Rfl: 3 .  JARDIANCE 10 MG TABS tablet, TAKE ONE TABLET BY MOUTH ONCE DAILY, Disp: 90 tablet, Rfl: 3 .  Lancets (ONETOUCH ULTRASOFT) lancets, Check sugar once daily  ,DX E11.9, Disp: 30 each, Rfl: 12 .  losartan-hydrochlorothiazide (HYZAAR) 100-25 MG tablet, TAKE 1 TABLET BY MOUTH ONCE DAILY, Disp: 90 tablet, Rfl: 3 .  lovastatin (MEVACOR) 10 MG tablet, TAKE ONE TABLET BY MOUTH IN THE EVENING, Disp: 90 tablet, Rfl: 3 .  montelukast (SINGULAIR) 10 MG tablet, TAKE ONE TABLET BY MOUTH AT BEDTIME, Disp: 90 tablet, Rfl: 3 .  pantoprazole (PROTONIX) 40 MG tablet, TAKE ONE TABLET BY MOUTH ONCE DAILY, Disp: 90 tablet, Rfl: 3 .  vitamin B-12 (CYANOCOBALAMIN) 1000  MCG tablet, Take 1,000 mcg by mouth daily., Disp: , Rfl:  .  Vitamin D, Ergocalciferol, (DRISDOL) 50000 units CAPS capsule, TAKE ONE CAPSULE BY MOUTH ONCE A WEEK, Disp: 4 capsule, Rfl: 12 .  Vitamin D, Ergocalciferol, (DRISDOL) 50000 units CAPS capsule, Take 50,000 Units by mouth every 7 (seven) days., Disp: , Rfl:  .  albuterol (PROVENTIL HFA) 108 (90 BASE) MCG/ACT inhaler, Inhale 1-2 puffs into the lungs as needed. , Disp: , Rfl:    Patient Care Team: Jerrol Banana., MD as PCP - General (Family Medicine)      Objective:   Vitals: BP 130/78   Pulse 63   Temp 98.5 F (36.9 C) (Oral)   Resp 16   Ht 5\' 8"  (1.727 m)   Wt 263 lb 12.8 oz (119.7 kg)   SpO2 96%   BMI 40.11 kg/m    Vitals:   03/26/18 1046  BP: 130/78  Pulse: 63  Resp: 16  Temp: 98.5 F (36.9 C)  TempSrc: Oral  SpO2: 96%  Weight: 263 lb 12.8 oz (119.7 kg)  Height: 5\' 8"  (1.727 m)     Physical Exam  Constitutional: She is oriented to person, place, and time. She appears well-developed and well-nourished.  HENT:  Head: Normocephalic and atraumatic.  Right Ear: External ear normal.  Left Ear: External ear normal.  Nose: Nose normal.  Eyes: Conjunctivae are normal. No scleral icterus.  Neck: No thyromegaly present.  Cardiovascular: Normal rate, regular rhythm and normal heart sounds.  Pulmonary/Chest: Effort normal and breath sounds normal.  Abdominal: Soft.  Musculoskeletal: She exhibits no edema.  Neurological:  She is alert and oriented to person, place, and time.  Skin: Skin is warm and dry.  Psychiatric: She has a normal mood and affect. Her behavior is normal. Judgment and thought content normal.     Depression Screen PHQ 2/9 Scores 03/26/2018 03/26/2018 11/20/2017 11/14/2016  PHQ - 2 Score 0 0 0 0  PHQ- 9 Score - 0 - 0      Assessment & Plan:     Routine Health Maintenance and Physical Exam  Exercise Activities and Dietary recommendations Goals    . Exercise 150 minutes per week (moderate activity)       Immunization History  Administered Date(s) Administered  . Influenza Whole 04/23/2017  . Pneumococcal Polysaccharide-23 02/29/2012  . Td 08/17/2003  . Tdap 04/18/2011    Health Maintenance  Topic Date Due  . HIV Screening  05/03/1969  . OPHTHALMOLOGY EXAM  07/02/2014  . PAP SMEAR  07/15/2017  . FOOT EXAM  11/14/2017  . INFLUENZA VACCINE  01/30/2018  . HEMOGLOBIN A1C  05/23/2018  . MAMMOGRAM  04/04/2019  . TETANUS/TDAP  04/17/2021  . COLONOSCOPY  07/18/2027  . PNEUMOCOCCAL POLYSACCHARIDE VACCINE AGE 34-64 HIGH RISK  Completed  . Hepatitis C Screening  Completed    Pap next year. Discussed health benefits of physical activity, and encouraged her to engage in regular exercise appropriate for her age and condition.              RTC 4 months. 1. Need for influenza vaccination  - Flu Vaccine QUAD 36+ mos IM  2. Breast cancer screening  - MM Digital Screening; Future  3. Diabetes mellitus without complication ([place])  - CBC with Differential/Platelet - Hemoglobin A1c  4. Essential (primary) hypertension  - Comprehensive metabolic panel - TSH  5. Hypercholesteremia  - Lipid panel   I have done the exam and reviewed the above chart  and it is accurate to the best of my knowledge. Development worker, community has been used in this note in any air is in the dictation or transcription are unintentional.  [name], MD  [place]

## 2018-03-27 LAB — COMPREHENSIVE METABOLIC PANEL
ALT: 21 IU/L (ref 0–32)
AST: 17 IU/L (ref 0–40)
Albumin/Globulin Ratio: 1.3 (ref 1.2–2.2)
Albumin: 4 g/dL (ref 3.6–4.8)
Alkaline Phosphatase: 60 IU/L (ref 39–117)
BUN/Creatinine Ratio: 20 (ref 12–28)
BUN: 16 mg/dL (ref 8–27)
Bilirubin Total: 0.5 mg/dL (ref 0.0–1.2)
CO2: 25 mmol/L (ref 20–29)
Calcium: 9.5 mg/dL (ref 8.7–10.3)
Chloride: 104 mmol/L (ref 96–106)
Creatinine, Ser: 0.81 mg/dL (ref 0.57–1.00)
GFR calc Af Amer: 89 mL/min/{1.73_m2} (ref >59)
GFR calc non Af Amer: 78 mL/min/{1.73_m2} (ref >59)
Globulin, Total: 3.1 g/dL (ref 1.5–4.5)
Glucose: 116 mg/dL — ABNORMAL HIGH (ref 65–99)
Potassium: 4 mmol/L (ref 3.5–5.2)
Sodium: 142 mmol/L (ref 134–144)
Total Protein: 7.1 g/dL (ref 6.0–8.5)

## 2018-03-27 LAB — HEMOGLOBIN A1C
Est. average glucose Bld gHb Est-mCnc: 157 mg/dL
Hgb A1c MFr Bld: 7.1 % — ABNORMAL HIGH (ref 4.8–5.6)

## 2018-03-27 LAB — CBC WITH DIFFERENTIAL/PLATELET
Basophils Absolute: 0.1 10*3/uL (ref 0.0–0.2)
Basos: 1 %
EOS (ABSOLUTE): 0.1 10*3/uL (ref 0.0–0.4)
Eos: 2 %
Hematocrit: 38.9 % (ref 34.0–46.6)
Hemoglobin: 12.7 g/dL (ref 11.1–15.9)
Immature Grans (Abs): 0 10*3/uL (ref 0.0–0.1)
Immature Granulocytes: 0 %
Lymphocytes Absolute: 1.8 10*3/uL (ref 0.7–3.1)
Lymphs: 28 %
MCH: 27.8 pg (ref 26.6–33.0)
MCHC: 32.6 g/dL (ref 31.5–35.7)
MCV: 85 fL (ref 79–97)
Monocytes Absolute: 0.4 10*3/uL (ref 0.1–0.9)
Monocytes: 7 %
Neutrophils Absolute: 4 10*3/uL (ref 1.4–7.0)
Neutrophils: 62 %
Platelets: 271 10*3/uL (ref 150–450)
RBC: 4.57 x10E6/uL (ref 3.77–5.28)
RDW: 13 % (ref 12.3–15.4)
WBC: 6.5 10*3/uL (ref 3.4–10.8)

## 2018-03-27 LAB — LIPID PANEL
Chol/HDL Ratio: 5.1 ratio — ABNORMAL HIGH (ref 0.0–4.4)
Cholesterol, Total: 184 mg/dL (ref 100–199)
HDL: 36 mg/dL — ABNORMAL LOW (ref >39)
LDL Calculated: 122 mg/dL — ABNORMAL HIGH (ref 0–99)
Triglycerides: 132 mg/dL (ref 0–149)
VLDL Cholesterol Cal: 26 mg/dL (ref 5–40)

## 2018-03-27 LAB — TSH: TSH: 1.73 u[IU]/mL (ref 0.450–4.500)

## 2018-03-31 ENCOUNTER — Telehealth: Payer: Self-pay

## 2018-03-31 DIAGNOSIS — E7800 Pure hypercholesterolemia, unspecified: Secondary | ICD-10-CM

## 2018-03-31 MED ORDER — ROSUVASTATIN CALCIUM 20 MG PO TABS: 20.0000 mg | ORAL_TABLET | Freq: Every day | ORAL | 1 refills | Status: DC

## 2018-03-31 NOTE — Telephone Encounter (Signed)
-----   Message from Jerrol Banana., MD sent at 03/31/2018 11:00 AM EDT ----- Labs ok but cholesterol too high.  Stop lovastatin and start rosuvastatin 20 mg daily.  Turn to clinic 1 month.

## 2018-03-31 NOTE — Telephone Encounter (Signed)
Advised patient of results. Medication was sent into the pharmacy.  

## 2018-04-04 ENCOUNTER — Other Ambulatory Visit: Payer: Self-pay | Admitting: Family Medicine

## 2018-04-04 DIAGNOSIS — E119 Type 2 diabetes mellitus without complications: Secondary | ICD-10-CM

## 2018-04-26 ENCOUNTER — Other Ambulatory Visit: Payer: Self-pay | Admitting: Family Medicine

## 2018-04-26 DIAGNOSIS — J302 Other seasonal allergic rhinitis: Secondary | ICD-10-CM

## 2018-04-26 DIAGNOSIS — K219 Gastro-esophageal reflux disease without esophagitis: Secondary | ICD-10-CM

## 2018-04-30 ENCOUNTER — Ambulatory Visit: Payer: BC Managed Care – PPO | Admitting: Podiatry

## 2018-05-26 ENCOUNTER — Other Ambulatory Visit: Payer: Self-pay | Admitting: Family Medicine

## 2018-05-26 ENCOUNTER — Telehealth: Payer: Self-pay | Admitting: Family Medicine

## 2018-05-26 MED ORDER — HYDROCHLOROTHIAZIDE 25 MG PO TABS: 25.0000 mg | ORAL_TABLET | Freq: Every day | ORAL | 3 refills | Status: DC

## 2018-05-26 MED ORDER — LOSARTAN POTASSIUM 100 MG PO TABS: 100.0000 mg | ORAL_TABLET | Freq: Every day | ORAL | 3 refills | Status: DC

## 2018-05-26 NOTE — Telephone Encounter (Signed)
[facility] says losartan-hydrochlorothiazide (HYZAAR) 100-25 MG tablet is on back order.  They do not know when they can get this in to fill Rx. [place], [address] [phone] (Phone) [phone] (Fax)    Requesting 2 separate medications: Losartan 100 mg hydrochlorothiazide   25 mg  Please advise.  Thanks, [employer]

## 2018-05-26 NOTE — Telephone Encounter (Signed)
Rx's sent to pharmacy.  

## 2018-05-26 NOTE — Telephone Encounter (Signed)
Ok--1 year refills--thx

## 2018-05-26 NOTE — Telephone Encounter (Signed)
Please advise 

## 2018-06-04 ENCOUNTER — Ambulatory Visit: Payer: BC Managed Care – PPO | Admitting: Podiatry

## 2018-06-09 ENCOUNTER — Other Ambulatory Visit: Payer: Self-pay | Admitting: Family Medicine

## 2018-06-09 DIAGNOSIS — E119 Type 2 diabetes mellitus without complications: Secondary | ICD-10-CM

## 2018-06-18 ENCOUNTER — Other Ambulatory Visit: Payer: Self-pay | Admitting: Family Medicine

## 2018-06-18 DIAGNOSIS — J302 Other seasonal allergic rhinitis: Secondary | ICD-10-CM

## 2018-07-03 ENCOUNTER — Other Ambulatory Visit: Payer: Self-pay | Admitting: Family Medicine

## 2018-07-09 ENCOUNTER — Encounter: Payer: Self-pay | Admitting: Podiatry

## 2018-07-09 ENCOUNTER — Ambulatory Visit: Payer: BC Managed Care – PPO | Admitting: Podiatry

## 2018-07-09 DIAGNOSIS — B351 Tinea unguium: Secondary | ICD-10-CM | POA: Diagnosis not present

## 2018-07-09 DIAGNOSIS — L84 Corns and callosities: Secondary | ICD-10-CM

## 2018-07-09 DIAGNOSIS — M79676 Pain in unspecified toe(s): Secondary | ICD-10-CM | POA: Diagnosis not present

## 2018-07-09 NOTE — Progress Notes (Signed)
She presents today for nail and callus trim.  She has a history of diabetes with reactive hyper keratomas to the plantar aspect of the fifth metatarsals bilateral.  She like her nails trimmed too.  Objective: Vital signs are stable she is alert and oriented x3.  Pulses are palpable.  Large reactive hyperkeratotic lesions plantar medial aspect of the hallux interphalangeal joint and the plantar lateral aspect of fifth metatarsals bilaterally.  Toenails are long thick yellow dystrophic-like mycotic once these areas were debrided the pre-ulcerative lesions they do not probe deep and they do not demonstrate any broken dermis.  Assessment: Pre-ulcerative lesions bilaterally.  This is associated mostly with shoe gear.  Pain in limb secondary to onychomycosis.  Plan: Debridement of ulcers and debridement of reactive toenails bilaterally.  Follow-up with her in about 3 months and I did discuss appropriate shoe gear with her once again.

## 2018-07-30 ENCOUNTER — Ambulatory Visit: Payer: Self-pay | Admitting: Family Medicine

## 2018-08-06 ENCOUNTER — Encounter: Payer: Self-pay | Admitting: Family Medicine

## 2018-08-06 ENCOUNTER — Ambulatory Visit: Payer: BC Managed Care – PPO | Admitting: Family Medicine

## 2018-08-06 VITALS — BP 122/78 | HR 81 | Temp 98.4°F | Wt 265.2 lb

## 2018-08-06 DIAGNOSIS — E119 Type 2 diabetes mellitus without complications: Secondary | ICD-10-CM

## 2018-08-06 DIAGNOSIS — I10 Essential (primary) hypertension: Secondary | ICD-10-CM

## 2018-08-06 DIAGNOSIS — E668 Other obesity: Secondary | ICD-10-CM | POA: Diagnosis not present

## 2018-08-06 DIAGNOSIS — G4733 Obstructive sleep apnea (adult) (pediatric): Secondary | ICD-10-CM

## 2018-08-06 DIAGNOSIS — E7800 Pure hypercholesterolemia, unspecified: Secondary | ICD-10-CM

## 2018-08-06 DIAGNOSIS — Z803 Family history of malignant neoplasm of breast: Secondary | ICD-10-CM

## 2018-08-06 LAB — POCT GLYCOSYLATED HEMOGLOBIN (HGB A1C)
Est. average glucose Bld gHb Est-mCnc: 154
Hemoglobin A1C: 7 % — AB (ref 4.0–5.6)

## 2018-08-06 NOTE — Progress Notes (Signed)
Patient: Karen Stephenson Female    DOB: 1955/08/14   63 y.o.   MRN: [id number] Visit Date: 08/06/2018  Today's Provider: [name], MD   Chief Complaint  Patient presents with  . Diabetes  . Hypertension  . Hyperlipidemia   Subjective:     HPI  Diabetes Mellitus Type II, Follow-up:   Lab Results  Component Value Date   HGBA1C 7.1 (H) 03/26/2018   HGBA1C 7.1 (A) 11/20/2017   HGBA1C 6.9 07/24/2017   Last seen for diabetes 4 months ago.  Management since then includes none. She reports good compliance with treatment. She is not having side effects.  Current symptoms include none and have been stable. Home blood sugar records: arranges from 143-163  Episodes of hypoglycemia? no   Current Insulin Regimen: n/a Most Recent Eye Exam: over 2 years per patient. Weight trend: stable Prior visit with dietician: no Current diet: well balanced Current exercise: walking  ------------------------------------------------------------------------   Hypertension, follow-up:  BP Readings from Last 3 Encounters:  08/06/18 122/78  03/26/18 130/78  11/20/17 124/78    She was last seen for hypertension 4 months ago.  BP at that visit was 130/78. Management since that visit includes the Hyzaar 100-25 mg tablets was changed to 2 separate medication ( Losartan 100 mg, Hydrochlorothiazide 25 mg) and patient states she likes it better..She reports good compliance with treatment. She is not having side effects.  She is exercising. She is adherent to low salt diet.   Outside blood pressures are n/a. She is experiencing none.  Patient denies chest pain, chest pressure/discomfort, exertional chest pressure/discomfort, fatigue, irregular heart beat, palpitations and tachypnea.   Cardiovascular risk factors include none.  Use of agents associated with hypertension: none.   ------------------------------------------------------------------------    Lipid/Cholesterol,  Follow-up:   Last seen for this 4 months ago.  Management since that visit includes Lovastatin was change to Rosuvastatin 20 mg daily.  Last Lipid Panel:    Component Value Date/Time   CHOL 184 03/26/2018 1136   TRIG 132 03/26/2018 1136   HDL 36 (L) 03/26/2018 1136   CHOLHDL 5.1 (H) 03/26/2018 1136   LDLCALC 122 (H) 03/26/2018 1136    She reports good compliance with treatment. She is not having side effects.   Wt Readings from Last 3 Encounters:  08/06/18 265 lb 3.2 oz (120.3 kg)  03/26/18 263 lb 12.8 oz (119.7 kg)  11/20/17 265 lb (120.2 kg)    ------------------------------------------------------------------------   Allergies  Allergen Reactions  . Amoxicillin-Pot Clavulanate     no further information given.  . Aspirin   . Ducodyl [Bisacodyl]   . Erythromycin     Other reaction(s): Stomach Ache Stomach pain.  . Glipizide   . Metformin Hcl   . Macrobid [Nitrofurantoin]     Severe diarrhea, severe abdominal pain/cramping     Current Outpatient Medications:  .  acarbose (PRECOSE) 25 MG tablet, Take 1 tablet (25 mg total) by mouth 3 (three) times daily with meals., Disp: 90 tablet, Rfl: 3 .  acarbose (PRECOSE) 25 MG tablet, TAKE 1 TABLET BY MOUTH THREE TIMES DAILY WITH MEALS, Disp: 270 tablet, Rfl: 1 .  albuterol (PROVENTIL HFA) 108 (90 BASE) MCG/ACT inhaler, Inhale 1-2 puffs into the lungs as needed. , Disp: , Rfl:  .  BIOTIN PO, Take 1 tablet by mouth daily., Disp: , Rfl:  .  Cetirizine HCl (ZYRTEC ALLERGY) 10 MG CAPS, Take 1 capsule by mouth daily. , Disp: ,  Rfl:  .  fluticasone (FLONASE) 50 MCG/ACT nasal spray, USE 2 SPRAY(S) IN EACH NOSTRIL ONCE DAILY, Disp: 16 g, Rfl: 11 .  Fluticasone-Salmeterol (ADVAIR) 250-50 MCG/DOSE AEPB, INHALE ONE DOSE BY MOUTH AS NEEDED, Disp: 60 each, Rfl: 12 .  glucose blood test strip, Check sugar once daily, E 11.9, Disp: 25 each, Rfl: 12 .  guaiFENesin-codeine 100-10 MG/5ML syrup, Take 5 mLs by mouth 3 (three) times daily as  needed for cough., Disp: 120 mL, Rfl: 0 .  Homeopathic Products (LEG CRAMP RELIEF PO), Take 2 capsules by mouth daily. , Disp: , Rfl:  .  hydrochlorothiazide (HYDRODIURIL) 25 MG tablet, Take 1 tablet (25 mg total) by mouth daily., Disp: 90 tablet, Rfl: 3 .  hyoscyamine (LEVBID) 0.375 MG 12 hr tablet, Take 1 tablet (0.375 mg total) by mouth as needed., Disp: 60 tablet, Rfl: 5 .  JANUVIA 100 MG tablet, TAKE 1 TABLET BY MOUTH ONCE DAILY, Disp: 90 tablet, Rfl: 3 .  JARDIANCE 10 MG TABS tablet, TAKE 1 TABLET BY MOUTH ONCE DAILY, Disp: 90 tablet, Rfl: 0 .  Lancets (ONETOUCH ULTRASOFT) lancets, Check sugar once daily ,DX E11.9, Disp: 30 each, Rfl: 12 .  losartan (COZAAR) 100 MG tablet, Take 1 tablet (100 mg total) by mouth daily., Disp: 90 tablet, Rfl: 3 .  montelukast (SINGULAIR) 10 MG tablet, TAKE 1 TABLET BY MOUTH AT BEDTIME, Disp: 90 tablet, Rfl: 3 .  pantoprazole (PROTONIX) 40 MG tablet, TAKE 1 TABLET BY MOUTH ONCE DAILY, Disp: 90 tablet, Rfl: 3 .  rosuvastatin (CRESTOR) 20 MG tablet, Take 1 tablet (20 mg total) by mouth daily., Disp: 90 tablet, Rfl: 1 .  vitamin B-12 (CYANOCOBALAMIN) 1000 MCG tablet, Take 1,000 mcg by mouth daily., Disp: , Rfl:  .  Vitamin D, Ergocalciferol, (DRISDOL) 1.25 MG (50000 UT) CAPS capsule, TAKE 1 CAPSULE BY MOUTH ONCE A WEEK, Disp: 4 capsule, Rfl: 12 .  Vitamin D, Ergocalciferol, (DRISDOL) 50000 units CAPS capsule, Take 50,000 Units by mouth every 7 (seven) days., Disp: , Rfl:  .  losartan-hydrochlorothiazide (HYZAAR) 100-25 MG tablet, TAKE 1 TABLET BY MOUTH ONCE DAILY (Patient not taking: Reported on 08/06/2018), Disp: 90 tablet, Rfl: 3  Review of Systems  Constitutional: Negative for activity change, appetite change, chills, diaphoresis, fatigue, fever and unexpected weight change.  HENT: Negative.   Eyes: Negative.   Respiratory: Negative.   Cardiovascular: Negative for chest pain, palpitations and leg swelling.  Endocrine: Negative for cold intolerance, heat  intolerance, polydipsia, polyphagia and polyuria.  Genitourinary: Negative.   Musculoskeletal: Negative for arthralgias, joint swelling and myalgias.  Allergic/Immunologic: Negative.   Neurological: Negative for dizziness and headaches.  Psychiatric/Behavioral: Negative.     Social History   Tobacco Use  . Smoking status: Former Smoker    Types: Cigarettes    Last attempt to quit: 07/01/2013    Years since quitting: 5.1  . Smokeless tobacco: Never Used  Substance Use Topics  . Alcohol use: No      Objective:   BP 122/78 (BP Location: Left Arm, Patient Position: Sitting, Cuff Size: Large)   Pulse 81   Temp 98.4 F (36.9 C) (Oral)   Wt 265 lb 3.2 oz (120.3 kg)   SpO2 96%   BMI 40.32 kg/m  Vitals:   08/06/18 1347  BP: 122/78  Pulse: 81  Temp: 98.4 F (36.9 C)  TempSrc: Oral  SpO2: 96%  Weight: 265 lb 3.2 oz (120.3 kg)     Physical Exam Constitutional:  Appearance: She is well-developed.  HENT:     Head: Normocephalic and atraumatic.  Eyes:     General: No scleral icterus.    Conjunctiva/sclera: Conjunctivae normal.  Neck:     Thyroid: No thyromegaly.  Cardiovascular:     Rate and Rhythm: Normal rate and regular rhythm.     Heart sounds: Normal heart sounds.  Pulmonary:     Effort: Pulmonary effort is normal.     Breath sounds: Normal breath sounds.  Abdominal:     Palpations: Abdomen is soft.  Musculoskeletal:     Right lower leg: No edema.     Left lower leg: No edema.  Skin:    General: Skin is warm and dry.  Neurological:     General: No focal deficit present.     Mental Status: She is alert and oriented to person, place, and time. Mental status is at baseline.  Psychiatric:        Behavior: Behavior normal.        Thought Content: Thought content normal.        Judgment: Judgment normal.         Assessment & Plan       1. Diabetes mellitus without complication ([place]) Stable with A1c today at 7.  Return to clinic 4 to 5 months. -  POCT glycosylated hemoglobin (Hb A1C) - CBC with Differential/Platelet - Lipid panel  2. Obstructive apnea   3. Essential (primary) hypertension Controlled.  4. Extreme obesity   5. Family history of breast cancer  6.HLD Check labs.  I have done the exam and reviewed the above chart and it is accurate to the best of my knowledge. Development worker, community has been used in this note in any air is in the dictation or transcription are unintentional.  [name], MD  [place]

## 2018-08-06 NOTE — Patient Instructions (Signed)
Take Robitussin and increase fluids. Get plenty of rest.

## 2018-09-02 LAB — CBC WITH DIFFERENTIAL/PLATELET
Basophils Absolute: 0 10*3/uL (ref 0.0–0.2)
Basos: 1 %
EOS (ABSOLUTE): 0.2 10*3/uL (ref 0.0–0.4)
Eos: 3 %
Hematocrit: 38.2 % (ref 34.0–46.6)
Hemoglobin: 12.4 g/dL (ref 11.1–15.9)
Immature Grans (Abs): 0 10*3/uL (ref 0.0–0.1)
Immature Granulocytes: 0 %
Lymphocytes Absolute: 1.5 10*3/uL (ref 0.7–3.1)
Lymphs: 25 %
MCH: 27.4 pg (ref 26.6–33.0)
MCHC: 32.5 g/dL (ref 31.5–35.7)
MCV: 84 fL (ref 79–97)
Monocytes Absolute: 0.5 10*3/uL (ref 0.1–0.9)
Monocytes: 9 %
Neutrophils Absolute: 3.8 10*3/uL (ref 1.4–7.0)
Neutrophils: 62 %
Platelets: 231 10*3/uL (ref 150–450)
RBC: 4.53 x10E6/uL (ref 3.77–5.28)
RDW: 13.1 % (ref 11.7–15.4)
WBC: 6.1 10*3/uL (ref 3.4–10.8)

## 2018-09-02 LAB — LIPID PANEL
Chol/HDL Ratio: 3.8 ratio (ref 0.0–4.4)
Cholesterol, Total: 115 mg/dL (ref 100–199)
HDL: 30 mg/dL — ABNORMAL LOW (ref >39)
LDL Calculated: 63 mg/dL (ref 0–99)
Triglycerides: 108 mg/dL (ref 0–149)
VLDL Cholesterol Cal: 22 mg/dL (ref 5–40)

## 2018-09-02 LAB — HEMOGLOBIN A1C
Est. average glucose Bld gHb Est-mCnc: 163 mg/dL
Hgb A1c MFr Bld: 7.3 % — ABNORMAL HIGH (ref 4.8–5.6)

## 2018-09-28 ENCOUNTER — Other Ambulatory Visit: Payer: Self-pay | Admitting: Family Medicine

## 2018-10-01 ENCOUNTER — Ambulatory Visit: Payer: BC Managed Care – PPO | Admitting: Podiatry

## 2018-10-23 ENCOUNTER — Other Ambulatory Visit: Payer: Self-pay | Admitting: Family Medicine

## 2018-10-23 DIAGNOSIS — E7800 Pure hypercholesterolemia, unspecified: Secondary | ICD-10-CM

## 2018-10-29 ENCOUNTER — Encounter: Payer: Self-pay | Admitting: Podiatry

## 2018-10-29 ENCOUNTER — Ambulatory Visit: Payer: BC Managed Care – PPO | Admitting: Podiatry

## 2018-10-29 ENCOUNTER — Other Ambulatory Visit: Payer: Self-pay

## 2018-10-29 ENCOUNTER — Telehealth: Payer: Self-pay | Admitting: *Deleted

## 2018-10-29 VITALS — Temp 97.6°F

## 2018-10-29 DIAGNOSIS — I872 Venous insufficiency (chronic) (peripheral): Secondary | ICD-10-CM

## 2018-10-29 DIAGNOSIS — L97521 Non-pressure chronic ulcer of other part of left foot limited to breakdown of skin: Secondary | ICD-10-CM

## 2018-10-29 DIAGNOSIS — L97511 Non-pressure chronic ulcer of other part of right foot limited to breakdown of skin: Secondary | ICD-10-CM

## 2018-10-29 MED ORDER — CADEXOMER IODINE 0.9 % EX GEL: 1.0000 "application " | Freq: Every day | CUTANEOUS | 3 refills | Status: DC

## 2018-10-29 NOTE — Telephone Encounter (Signed)
Pt states she just left our office after a visit with Dr. Milinda Pointer and has a question about her shoes.

## 2018-10-29 NOTE — Progress Notes (Signed)
She presents today for nail care and diabetic foot care states that she is developed some sores on the sides of her great toes.  Has progressively become more sore.  She states that is really no sharp pain she says but they are red there is a slight odor and if there is some bloody drainage particularly from the one on the right.  She trimmed her own toenails and states that she did pretty well with that.  Objective: She denies fever chills nausea vomiting muscle aches pains calf pain back pain chest pain shortness of breath.  Pulses are strongly palpable and after debridement of ulceration to the medial aspect of the hallux right denuding all necrotic tissue noticing that she is easily believable she has considerable venous insufficiency with hypertension and congestion to the calf and distal calf.  Left hallux interphalangeal joint does demonstrate reactive hyperkeratotic tissue was debrided superficial ulcerations present as well.  None of this probes to bone but the ulcerations are relatively large greater than a centimeter and half in diameter of the right hallux and about a centimeter and on the left.  No other open lesions or wounds are noted.  Nothing appears to be cellulitic no purulence no malodor.  Assessment: Diabetic ulcerations vascular disease venous or arterial or both.  Diabetic neuropathy minimal.  Plan: Debridement of these wounds today placed her in [employer] shoes order venous and arterial studies she was prescribed Iodosorb gel dressing daily she is to keep wounds dry demonstrated dressings to her.  Follow-up with her in 2 weeks.  She will also follow-up with [facility] for diabetic shoes with a cut out beneath the hallux interphalangeal joint.  She needs an anti-pronator either orthotic or diabetic insert to help with that valgus position of her toes.

## 2018-11-10 ENCOUNTER — Encounter (INDEPENDENT_AMBULATORY_CARE_PROVIDER_SITE_OTHER): Payer: BC Managed Care – PPO

## 2018-11-10 ENCOUNTER — Encounter (INDEPENDENT_AMBULATORY_CARE_PROVIDER_SITE_OTHER): Payer: BC Managed Care – PPO | Admitting: Vascular Surgery

## 2018-11-12 ENCOUNTER — Encounter: Payer: Self-pay | Admitting: Podiatry

## 2018-11-12 ENCOUNTER — Ambulatory Visit: Payer: BC Managed Care – PPO | Admitting: Orthotics

## 2018-11-12 ENCOUNTER — Ambulatory Visit: Payer: BC Managed Care – PPO | Admitting: Podiatry

## 2018-11-12 ENCOUNTER — Other Ambulatory Visit: Payer: Self-pay

## 2018-11-12 VITALS — Temp 97.6°F

## 2018-11-12 DIAGNOSIS — L97521 Non-pressure chronic ulcer of other part of left foot limited to breakdown of skin: Secondary | ICD-10-CM

## 2018-11-12 DIAGNOSIS — I872 Venous insufficiency (chronic) (peripheral): Secondary | ICD-10-CM

## 2018-11-12 DIAGNOSIS — L97511 Non-pressure chronic ulcer of other part of right foot limited to breakdown of skin: Secondary | ICD-10-CM

## 2018-11-12 NOTE — Progress Notes (Signed)
She presents today for follow-up of ulcers hallux bilaterally.  She has purchased her Iodosorb from [facility] on line.  She denies fever chills nausea vomiting muscle aches or pain states the ulcers look much better.  Objective: Pulses remain palpable dressings removed demonstrate nearly 100% resolved ulceration itself.  Reactive hyperkeratosis is still present once debrided today does not demonstrate any purulence no malodor.  Assessment: Well-healing diabetic ulcerations hallux bilaterally.  Plan: Debrided the area today continue current therapies he was also scanned for orthotics.

## 2018-11-21 ENCOUNTER — Ambulatory Visit (INDEPENDENT_AMBULATORY_CARE_PROVIDER_SITE_OTHER): Payer: BC Managed Care – PPO

## 2018-11-21 ENCOUNTER — Ambulatory Visit (INDEPENDENT_AMBULATORY_CARE_PROVIDER_SITE_OTHER): Payer: BC Managed Care – PPO | Admitting: Nurse Practitioner

## 2018-11-21 ENCOUNTER — Encounter (INDEPENDENT_AMBULATORY_CARE_PROVIDER_SITE_OTHER): Payer: Self-pay | Admitting: Nurse Practitioner

## 2018-11-21 ENCOUNTER — Other Ambulatory Visit: Payer: Self-pay

## 2018-11-21 VITALS — BP 154/81 | HR 77 | Resp 16 | Ht 68.0 in | Wt 270.0 lb

## 2018-11-21 DIAGNOSIS — L97512 Non-pressure chronic ulcer of other part of right foot with fat layer exposed: Secondary | ICD-10-CM

## 2018-11-21 DIAGNOSIS — I872 Venous insufficiency (chronic) (peripheral): Secondary | ICD-10-CM | POA: Diagnosis not present

## 2018-11-21 DIAGNOSIS — Z79899 Other long term (current) drug therapy: Secondary | ICD-10-CM

## 2018-11-21 DIAGNOSIS — I10 Essential (primary) hypertension: Secondary | ICD-10-CM

## 2018-11-21 DIAGNOSIS — L97511 Non-pressure chronic ulcer of other part of right foot limited to breakdown of skin: Secondary | ICD-10-CM | POA: Diagnosis not present

## 2018-11-21 DIAGNOSIS — L97902 Non-pressure chronic ulcer of unspecified part of unspecified lower leg with fat layer exposed: Secondary | ICD-10-CM

## 2018-11-21 DIAGNOSIS — E119 Type 2 diabetes mellitus without complications: Secondary | ICD-10-CM

## 2018-11-21 DIAGNOSIS — L97521 Non-pressure chronic ulcer of other part of left foot limited to breakdown of skin: Secondary | ICD-10-CM | POA: Diagnosis not present

## 2018-11-21 DIAGNOSIS — L97522 Non-pressure chronic ulcer of other part of left foot with fat layer exposed: Secondary | ICD-10-CM | POA: Diagnosis not present

## 2018-11-21 DIAGNOSIS — Z87891 Personal history of nicotine dependence: Secondary | ICD-10-CM

## 2018-11-26 ENCOUNTER — Other Ambulatory Visit: Payer: Self-pay

## 2018-11-26 ENCOUNTER — Telehealth: Payer: Self-pay | Admitting: *Deleted

## 2018-11-26 DIAGNOSIS — I779 Disorder of arteries and arterioles, unspecified: Secondary | ICD-10-CM

## 2018-11-26 NOTE — Telephone Encounter (Signed)
-----   Message from Garrel Ridgel, [place] sent at 11/26/2018  7:07 AM EDT ----- She should probably be seen by vascular docs for RLE arterial disease and I will continue to follow the ulceration on the left.

## 2018-11-26 NOTE — Telephone Encounter (Signed)
Referral has been faxed to [facility] 

## 2018-11-26 NOTE — Progress Notes (Signed)
Patient came into today to be cast for [employer]otics. Upon recommendation of Dr. Milinda Pointer Patient presents with hx of ulcer great toe LEFT Goals are full contact cushioning, offloading  Plan vendor [employer]

## 2018-12-01 ENCOUNTER — Encounter (INDEPENDENT_AMBULATORY_CARE_PROVIDER_SITE_OTHER): Payer: Self-pay | Admitting: Nurse Practitioner

## 2018-12-01 DIAGNOSIS — I872 Venous insufficiency (chronic) (peripheral): Secondary | ICD-10-CM | POA: Insufficient documentation

## 2018-12-01 DIAGNOSIS — L97902 Non-pressure chronic ulcer of unspecified part of unspecified lower leg with fat layer exposed: Secondary | ICD-10-CM | POA: Insufficient documentation

## 2018-12-01 NOTE — Progress Notes (Signed)
SUBJECTIVE:  Patient ID: Karen Stephenson, female    DOB: [birth year], 63 y.o.   MRN: [id number] Chief Complaint  Patient presents with  . New Patient (Initial Visit)    ref Karen Stephenson for abi,ble ven reflux    HPI  Karen Stephenson is a 63 y.o. female there is referred by her podiatrist with concern for her bilateral lower extremity ulcerations.  The ulcerations are located on her bilateral large toes.  The patient states that the ulcerations are currently healing however they have just been slow to heal.  She denies any claudication-like symptoms.  She denies any rest pain.  She states that the ulcerations occur after rubbing from her shoes.  She denies any swelling of her bilateral lower extremities.  She endorses discomfort in her lower extremities and in the end of the day.  However there is nothing that is extreme.  Today the patient underwent noninvasive studies today which revealed a right ABI of 0.90 and a left of 0.97.  She had triphasic waveforms within her tibial arteries bilaterally and strong toe waveforms bilaterally.    The patient also underwent venous reflux studies which revealed no evidence of DVT or superficial venous thrombosis bilaterally.  The patient did have evidence of reflux within her deep venous system her right lower extremity has reflux within her common femoral vein, saphenofemoral junction, femoral vein and popliteal vein.  The left lower extremity has reflux in the common femoral vein, saphenofemoral junction.  She has reflux in her great saphenous vein bilaterally. Past Medical History:  Diagnosis Date  . Anginal pain ([place])   . Asthma   . Diabetes mellitus without complication ([place])   . GERD (gastroesophageal reflux disease)   . Hypercholesteremia   . Hypertension   . Hypokalemia   . Sleep apnea     Past Surgical History:  Procedure Laterality Date  . BACK SURGERY  1986  . COLONOSCOPY WITH PROPOFOL N/A 07/17/2017   Procedure: COLONOSCOPY WITH PROPOFOL;   Surgeon: Manya Silvas, MD;  Location: [facility] ENDOSCOPY;  Service: Endoscopy;  Laterality: N/A;  . NASAL SINUS SURGERY    . UTERINE FIBROID SURGERY      Social History   Socioeconomic History  . Marital status: Married    Spouse name: Not on file  . Number of children: Not on file  . Years of education: Not on file  . Highest education level: Not on file  Occupational History  . Not on file  Social Needs  . Financial resource strain: Not on file  . Food insecurity:    Worry: Not on file    Inability: Not on file  . Transportation needs:    Medical: Not on file    Non-medical: Not on file  Tobacco Use  . Smoking status: Former Smoker    Types: Cigarettes    Last attempt to quit: 07/01/2013    Years since quitting: 5.4  . Smokeless tobacco: Never Used  Substance and Sexual Activity  . Alcohol use: No  . Drug use: No  . Sexual activity: Not on file  Lifestyle  . Physical activity:    Days per week: Not on file    Minutes per session: Not on file  . Stress: Not on file  Relationships  . Social connections:    Talks on phone: Not on file    Gets together: Not on file    Attends religious service: Not on file    Active member of club or  organization: Not on file    Attends meetings of clubs or organizations: Not on file    Relationship status: Not on file  . Intimate partner violence:    Fear of current or ex partner: Not on file    Emotionally abused: Not on file    Physically abused: Not on file    Forced sexual activity: Not on file  Other Topics Concern  . Not on file  Social History Narrative  . Not on file    Family History  Problem Relation Age of Onset  . Diabetes Mother   . Breast cancer Mother 20  . Heart disease Father   . Breast cancer Sister 64       dx twice   . Heart disease Paternal Grandfather   . Hypertension Brother     Allergies  Allergen Reactions  . Amoxicillin-Pot Clavulanate     no further information given.  . Aspirin   .  Ducodyl [Bisacodyl]   . Erythromycin     Other reaction(s): Stomach Ache Stomach pain.  . Glipizide   . Metformin Hcl   . Macrobid [Nitrofurantoin]     Severe diarrhea, severe abdominal pain/cramping     Review of Systems   Review of Systems: Negative Unless Checked Constitutional: [] Weight loss  [] Fever  [] Chills Cardiac: [] Chest pain   []  Atrial Fibrillation  [] Palpitations   [] Shortness of breath when laying flat   [] Shortness of breath with exertion. [] Shortness of breath at rest Vascular:  [] Pain in legs with walking   [] Pain in legs with standing [] Pain in legs when laying flat   [] Claudication    [] Pain in feet when laying flat    [] History of DVT   [] Phlebitis   [x] Swelling in legs   [] Varicose veins   [x] Non-healing ulcers Pulmonary:   [] Uses home oxygen   [] Productive cough   [] Hemoptysis   [] Wheeze  [] COPD   [] Asthma Neurologic:  [] Dizziness   [] Seizures  [] Blackouts [] History of stroke   [] History of TIA  [] Aphasia   [] Temporary Blindness   [] Weakness or numbness in arm   [x] Weakness or numbness in leg Musculoskeletal:   [] Joint swelling   [] Joint pain   [] Low back pain  []  History of Knee Replacement [] Arthritis [] back Surgeries  []  Spinal Stenosis    Hematologic:  [] Easy bruising  [] Easy bleeding   [] Hypercoagulable state   [] Anemic Gastrointestinal:  [] Diarrhea   [] Vomiting  [] Gastroesophageal reflux/heartburn   [] Difficulty swallowing. [] Abdominal pain Genitourinary:  [] Chronic kidney disease   [] Difficult urination  [] Anuric   [] Blood in urine [] Frequent urination  [] Burning with urination   [] Hematuria Skin:  [] Rashes   [] Ulcers [] Wounds Psychological:  [] History of anxiety   []  History of major depression  []  Memory Difficulties      OBJECTIVE:   Physical Exam  BP (!) 154/81 (BP Location: Right Arm)   Pulse 77   Resp 16   Ht 5\' 8"  (1.727 m)   Wt 270 lb (122.5 kg)   BMI 41.05 kg/m   Gen: WD/WN, NAD Head: [place]/AT, No temporalis wasting.  Ear/Nose/Throat:  Hearing grossly intact, nares w/o erythema or drainage Eyes: PER, EOMI, sclera nonicteric.  Neck: Supple, no masses.  No JVD.  Pulmonary:  Good air movement, no use of accessory muscles.  Cardiac: RRR Vascular:  Ulceration on her bilateral big toes. Vessel Right Left  Radial Palpable Palpable  Dorsalis Pedis Palpable Palpable  Posterior Tibial Palpable Palpable   Gastrointestinal: soft, non-distended. No guarding/no peritoneal signs.  Musculoskeletal: M/S 5/5 throughout.  No deformity or atrophy.  Neurologic: Pain and light touch intact in extremities.  Symmetrical.  Speech is fluent. Motor exam as listed above. Psychiatric: Judgment intact, Mood & affect appropriate for pt's clinical situation. Dermatologic: No Venous rashes. No Ulcers Noted.  No changes consistent with cellulitis. Lymph : No Cervical lymphadenopathy, no lichenification or skin changes of chronic lymphedema.       ASSESSMENT AND PLAN:  1. Chronic venous insufficiency The patient also underwent venous reflux studies which revealed no evidence of DVT or superficial venous thrombosis bilaterally.  The patient did have evidence of reflux within her deep venous system her right lower extremity has reflux within her common femoral vein, saphenofemoral junction, femoral vein and popliteal vein.  The left lower extremity has reflux in the common femoral vein, saphenofemoral junction.  She has reflux in her great saphenous vein bilaterally.  Recommend:  The patient is complaining of varicose veins.    I have had a long discussion with the patient regarding  varicose veins and why they cause symptoms.  Patient will begin wearing graduated compression stockings on a daily basis, beginning first thing in the morning and removing them in the evening. The patient is instructed specifically not to sleep in the stockings.    The patient  will also begin using over-the-counter analgesics such as Motrin 600 mg po TID to help control  the symptoms as needed.    In addition, behavioral modification including elevation during the day will be initiated, utilizing a recliner was recommended.  The patient is also instructed to continue exercising such as walking 4-5 times per week.  At this time the patient wishes to continue conservative therapy and is not interested in more invasive treatments such as laser ablation and sclerotherapy.  The Patient will follow up in 6 months to evaluate lower extremity swelling and conservative measures, as well as to allow time for her lower extremity ulcerations to heal.  2. Essential (primary) hypertension Continue antihypertensive medications as already ordered, these medications have been reviewed and there are no changes at this time.   3. Diabetes mellitus without complication ([place]) Continue hypoglycemic medications as already ordered, these medications have been reviewed and there are no changes at this time.  Hgb A1C to be monitored as already arranged by primary service   4. Lower extremity ulceration, unspecified laterality, with fat layer exposed ([place]) Today the patient underwent noninvasive studies today which revealed a right ABI of 0.90 and a left of 0.97.  She had triphasic waveforms within her tibial arteries bilaterally and strong toe waveforms bilaterally.    Upon the patient's noninvasive study she should have adequate blood flow to heal her lower extremity ulcerations.  We will defer further treatment of her ulcerations back to her podiatrist.   Current Outpatient Medications on File Prior to Visit  Medication Sig Dispense Refill  . acarbose (PRECOSE) 25 MG tablet Take 1 tablet (25 mg total) by mouth 3 (three) times daily with meals. 90 tablet 3  . acarbose (PRECOSE) 25 MG tablet TAKE 1 TABLET BY MOUTH THREE TIMES DAILY WITH MEALS 270 tablet 1  . albuterol (PROVENTIL HFA) 108 (90 BASE) MCG/ACT inhaler Inhale 1-2 puffs into the lungs as needed.     Marland Kitchen BIOTIN PO Take 1  tablet by mouth daily.    . cadexomer iodine (IODOSORB) 0.9 % gel Apply 1 application topically daily. 40 g 3  . Cetirizine HCl (ZYRTEC ALLERGY) 10 MG CAPS Take 1  capsule by mouth daily.     . fluticasone (FLONASE) 50 MCG/ACT nasal spray USE 2 SPRAY(S) IN EACH NOSTRIL ONCE DAILY 16 g 11  . Fluticasone-Salmeterol (ADVAIR) 250-50 MCG/DOSE AEPB INHALE ONE DOSE BY MOUTH AS NEEDED 60 each 12  . glucose blood test strip Check sugar once daily, E 11.9 25 each 12  . guaiFENesin-codeine 100-10 MG/5ML syrup Take 5 mLs by mouth 3 (three) times daily as needed for cough. 120 mL 0  . Homeopathic Products (LEG CRAMP RELIEF PO) Take 2 capsules by mouth daily.     . hydrochlorothiazide (HYDRODIURIL) 25 MG tablet Take 1 tablet (25 mg total) by mouth daily. 90 tablet 3  . hyoscyamine (LEVBID) 0.375 MG 12 hr tablet Take 1 tablet (0.375 mg total) by mouth as needed. 60 tablet 5  . JANUVIA 100 MG tablet TAKE 1 TABLET BY MOUTH ONCE DAILY 90 tablet 3  . JARDIANCE 10 MG TABS tablet Take 1 tablet by mouth once daily 90 tablet 3  . Lancets (ONETOUCH ULTRASOFT) lancets Check sugar once daily ,DX E11.9 30 each 12  . losartan (COZAAR) 100 MG tablet Take 1 tablet (100 mg total) by mouth daily. 90 tablet 3  . montelukast (SINGULAIR) 10 MG tablet TAKE 1 TABLET BY MOUTH AT BEDTIME 90 tablet 3  . pantoprazole (PROTONIX) 40 MG tablet TAKE 1 TABLET BY MOUTH ONCE DAILY 90 tablet 3  . rosuvastatin (CRESTOR) 20 MG tablet Take 1 tablet by mouth once daily 90 tablet 0  . vitamin B-12 (CYANOCOBALAMIN) 1000 MCG tablet Take 1,000 mcg by mouth daily.    . Vitamin D, Ergocalciferol, (DRISDOL) 1.25 MG (50000 UT) CAPS capsule TAKE 1 CAPSULE BY MOUTH ONCE A WEEK 4 capsule 12  . Vitamin D, Ergocalciferol, (DRISDOL) 50000 units CAPS capsule Take 50,000 Units by mouth every 7 (seven) days.    Marland Kitchen losartan-hydrochlorothiazide (HYZAAR) 100-25 MG tablet TAKE 1 TABLET BY MOUTH ONCE DAILY (Patient not taking: Reported on 08/06/2018) 90 tablet 3   No  current facility-administered medications on file prior to visit.     There are no Patient Instructions on file for this visit. Return in about 6 months (around 05/24/2019).   Kris Hartmann, NP  This note was completed with Sales executive.  Any errors are purely unintentional.

## 2018-12-10 ENCOUNTER — Ambulatory Visit: Payer: BC Managed Care – PPO | Admitting: Orthotics

## 2018-12-10 ENCOUNTER — Ambulatory Visit: Payer: BC Managed Care – PPO | Admitting: Family Medicine

## 2018-12-10 ENCOUNTER — Encounter: Payer: Self-pay | Admitting: Podiatry

## 2018-12-10 ENCOUNTER — Other Ambulatory Visit: Payer: Self-pay

## 2018-12-10 ENCOUNTER — Encounter: Payer: Self-pay | Admitting: Family Medicine

## 2018-12-10 ENCOUNTER — Ambulatory Visit: Payer: BC Managed Care – PPO | Admitting: Podiatry

## 2018-12-10 VITALS — BP 128/78 | HR 81 | Temp 98.6°F | Resp 16 | Wt 271.0 lb

## 2018-12-10 VITALS — Temp 97.0°F

## 2018-12-10 DIAGNOSIS — L97511 Non-pressure chronic ulcer of other part of right foot limited to breakdown of skin: Secondary | ICD-10-CM

## 2018-12-10 DIAGNOSIS — B351 Tinea unguium: Secondary | ICD-10-CM | POA: Diagnosis not present

## 2018-12-10 DIAGNOSIS — I10 Essential (primary) hypertension: Secondary | ICD-10-CM | POA: Diagnosis not present

## 2018-12-10 DIAGNOSIS — M79676 Pain in unspecified toe(s): Secondary | ICD-10-CM

## 2018-12-10 DIAGNOSIS — G4733 Obstructive sleep apnea (adult) (pediatric): Secondary | ICD-10-CM | POA: Diagnosis not present

## 2018-12-10 DIAGNOSIS — K219 Gastro-esophageal reflux disease without esophagitis: Secondary | ICD-10-CM | POA: Diagnosis not present

## 2018-12-10 DIAGNOSIS — E119 Type 2 diabetes mellitus without complications: Secondary | ICD-10-CM | POA: Diagnosis not present

## 2018-12-10 DIAGNOSIS — L97521 Non-pressure chronic ulcer of other part of left foot limited to breakdown of skin: Secondary | ICD-10-CM

## 2018-12-10 DIAGNOSIS — E668 Other obesity: Secondary | ICD-10-CM

## 2018-12-10 LAB — POCT GLYCOSYLATED HEMOGLOBIN (HGB A1C)
Est. average glucose Bld gHb Est-mCnc: 183
Hemoglobin A1C: 8 % — AB (ref 4.0–5.6)

## 2018-12-10 NOTE — Patient Instructions (Signed)

## 2018-12-10 NOTE — Progress Notes (Signed)
Patient: Karen Stephenson Female    DOB: 13-Oct-1955   63 y.o.   MRN: [id number] Visit Date: 12/10/2018  Today's Provider: [name], MD   Chief Complaint  Patient presents with  . Diabetes  . Hypertension  . Hyperlipidemia   Subjective:     Flank Pain  This is a recurrent problem. The current episode started 1 to 4 weeks ago. The problem occurs intermittently. The problem is unchanged. The quality of the pain is described as aching. The pain does not radiate. The symptoms are aggravated by standing. Pertinent negatives include no abdominal pain, bladder incontinence, bowel incontinence, chest pain, dysuria, fever, headaches, leg pain, numbness, paresis, paresthesias, pelvic pain, perianal numbness, tingling, weakness or weight loss. She has tried bed rest for the symptoms.    Diabetes Mellitus Type II, Follow-up:   Lab Results  Component Value Date   HGBA1C 7.3 (H) 09/01/2018   HGBA1C 7.0 (A) 08/06/2018   HGBA1C 7.1 (H) 03/26/2018   Last seen for diabetes 4 months ago.  Management since then includes none. She reports excellent compliance with treatment. She is not having side effects.  Current symptoms include paresthesia of the feet and have been unchanged. Home blood sugar records: 133-143  Episodes of hypoglycemia? no   Current Insulin Regimen: N/A Most Recent Eye Exam: 2015 per patient Weight trend: stable Prior visit with dietician: no Current diet: in general, a "healthy" diet   Current exercise: none  ------------------------------------------------------------------------   Hypertension, follow-up:  BP Readings from Last 3 Encounters:  12/10/18 128/78  11/21/18 (!) 154/81  08/06/18 122/78    She was last seen for hypertension 4 months ago.  BP at that visit was 122/78. Management since that visit includes none.She reports excellent compliance with treatment. She is not having side effects.  She is not exercising. She is adherent to  low salt diet.   Outside blood pressures are not not being checked. She is experiencing none.  Patient denies chest pain, chest pressure/discomfort, dyspnea, exertional chest pressure/discomfort, fatigue, irregular heart beat, lower extremity edema, near-syncope, palpitations, paroxysmal nocturnal dyspnea, syncope and tachypnea.   Cardiovascular risk factors include advanced age (older than 40 for men, 69 for women), diabetes mellitus and hypertension.  Use of agents associated with hypertension: none.   ------------------------------------------------------------------------    Lipid/Cholesterol, Follow-up:   Last seen for this 4 months ago.  Management since that visit includes none.  Last Lipid Panel:    Component Value Date/Time   CHOL 115 09/01/2018 1001   TRIG 108 09/01/2018 1001   HDL 30 (L) 09/01/2018 1001   CHOLHDL 3.8 09/01/2018 1001   LDLCALC 63 09/01/2018 1001    She reports excellent compliance with treatment. She is not having side effects.   Wt Readings from Last 3 Encounters:  12/10/18 271 lb (122.9 kg)  11/21/18 270 lb (122.5 kg)  08/06/18 265 lb 3.2 oz (120.3 kg)    ------------------------------------------------------------------------  Allergies  Allergen Reactions  . Amoxicillin-Pot Clavulanate     no further information given.  . Aspirin   . Ducodyl [Bisacodyl]   . Erythromycin     Other reaction(s): Stomach Ache Stomach pain.  . Glipizide   . Metformin Hcl   . Macrobid [Nitrofurantoin]     Severe diarrhea, severe abdominal pain/cramping     Current Outpatient Medications:  .  acarbose (PRECOSE) 25 MG tablet, Take 1 tablet (25 mg total) by mouth 3 (three) times daily with meals., Disp: 90 tablet,  Rfl: 3 .  acarbose (PRECOSE) 25 MG tablet, TAKE 1 TABLET BY MOUTH THREE TIMES DAILY WITH MEALS, Disp: 270 tablet, Rfl: 1 .  albuterol (PROVENTIL HFA) 108 (90 BASE) MCG/ACT inhaler, Inhale 1-2 puffs into the lungs as needed. , Disp: , Rfl:  .   BIOTIN PO, Take 1 tablet by mouth daily., Disp: , Rfl:  .  cadexomer iodine (IODOSORB) 0.9 % gel, Apply 1 application topically daily., Disp: 40 g, Rfl: 3 .  Cetirizine HCl (ZYRTEC ALLERGY) 10 MG CAPS, Take 1 capsule by mouth daily. , Disp: , Rfl:  .  fluticasone (FLONASE) 50 MCG/ACT nasal spray, USE 2 SPRAY(S) IN EACH NOSTRIL ONCE DAILY, Disp: 16 g, Rfl: 11 .  Fluticasone-Salmeterol (ADVAIR) 250-50 MCG/DOSE AEPB, INHALE ONE DOSE BY MOUTH AS NEEDED, Disp: 60 each, Rfl: 12 .  glucose blood test strip, Check sugar once daily, E 11.9, Disp: 25 each, Rfl: 12 .  Homeopathic Products (LEG CRAMP RELIEF PO), Take 2 capsules by mouth daily. , Disp: , Rfl:  .  hydrochlorothiazide (HYDRODIURIL) 25 MG tablet, Take 1 tablet (25 mg total) by mouth daily., Disp: 90 tablet, Rfl: 3 .  hyoscyamine (LEVBID) 0.375 MG 12 hr tablet, Take 1 tablet (0.375 mg total) by mouth as needed., Disp: 60 tablet, Rfl: 5 .  JANUVIA 100 MG tablet, TAKE 1 TABLET BY MOUTH ONCE DAILY, Disp: 90 tablet, Rfl: 3 .  JARDIANCE 10 MG TABS tablet, Take 1 tablet by mouth once daily, Disp: 90 tablet, Rfl: 3 .  Lancets (ONETOUCH ULTRASOFT) lancets, Check sugar once daily ,DX E11.9, Disp: 30 each, Rfl: 12 .  losartan (COZAAR) 100 MG tablet, Take 1 tablet (100 mg total) by mouth daily., Disp: 90 tablet, Rfl: 3 .  montelukast (SINGULAIR) 10 MG tablet, TAKE 1 TABLET BY MOUTH AT BEDTIME, Disp: 90 tablet, Rfl: 3 .  pantoprazole (PROTONIX) 40 MG tablet, TAKE 1 TABLET BY MOUTH ONCE DAILY, Disp: 90 tablet, Rfl: 3 .  rosuvastatin (CRESTOR) 20 MG tablet, Take 1 tablet by mouth once daily, Disp: 90 tablet, Rfl: 0 .  vitamin B-12 (CYANOCOBALAMIN) 1000 MCG tablet, Take 1,000 mcg by mouth daily., Disp: , Rfl:  .  Vitamin D, Ergocalciferol, (DRISDOL) 1.25 MG (50000 UT) CAPS capsule, TAKE 1 CAPSULE BY MOUTH ONCE A WEEK, Disp: 4 capsule, Rfl: 12 .  guaiFENesin-codeine 100-10 MG/5ML syrup, Take 5 mLs by mouth 3 (three) times daily as needed for cough. (Patient not  taking: Reported on 12/10/2018), Disp: 120 mL, Rfl: 0  Review of Systems  Constitutional: Negative for activity change, appetite change, chills, diaphoresis, fatigue, fever, unexpected weight change and weight loss.  HENT: Negative.   Eyes: Negative.   Respiratory: Negative.   Cardiovascular: Negative for chest pain, palpitations and leg swelling.  Gastrointestinal: Negative for abdominal pain and bowel incontinence.  Endocrine: Negative for cold intolerance, heat intolerance, polydipsia, polyphagia and polyuria.  Genitourinary: Positive for flank pain. Negative for bladder incontinence, dysuria and pelvic pain.  Musculoskeletal: Negative for arthralgias, joint swelling and myalgias.  Allergic/Immunologic: Negative.   Neurological: Negative for dizziness, tingling, weakness, numbness, headaches and paresthesias.  Psychiatric/Behavioral: Negative.     Social History   Tobacco Use  . Smoking status: Former Smoker    Types: Cigarettes    Last attempt to quit: 07/01/2013    Years since quitting: 5.4  . Smokeless tobacco: Never Used  Substance Use Topics  . Alcohol use: No      Objective:   BP 128/78   Pulse 81  Temp 98.6 F (37 C) (Oral)   Resp 16   Wt 271 lb (122.9 kg)   BMI 41.21 kg/m  Vitals:   12/10/18 0931  BP: 128/78  Pulse: 81  Resp: 16  Temp: 98.6 F (37 C)  TempSrc: Oral  Weight: 271 lb (122.9 kg)     Physical Exam Constitutional:      Appearance: She is well-developed.  HENT:     Head: Normocephalic and atraumatic.  Eyes:     General: No scleral icterus.    Conjunctiva/sclera: Conjunctivae normal.  Neck:     Thyroid: No thyromegaly.  Cardiovascular:     Rate and Rhythm: Normal rate and regular rhythm.     Heart sounds: Normal heart sounds.  Pulmonary:     Effort: Pulmonary effort is normal.     Breath sounds: Normal breath sounds.  Abdominal:     Palpations: Abdomen is soft.  Musculoskeletal:     Right lower leg: No edema.     Left lower  leg: No edema.  Skin:    General: Skin is warm and dry.  Neurological:     General: No focal deficit present.     Mental Status: She is alert and oriented to person, place, and time. Mental status is at baseline.  Psychiatric:        Mood and Affect: Mood normal.        Behavior: Behavior normal.        Thought Content: Thought content normal.        Judgment: Judgment normal.         Assessment & Plan    1. Type 2 diabetes mellitus without complication, without long-term current use of insulin ([place]) lifestlyle stressed.  Pt admits to not paying attention during Covid pandemic. - POCT glycosylated hemoglobin (Hb A1C)--8.0 today  2. Gastroesophageal reflux disease without esophagitis Controlled--try Pantoprazole every other day.    3. Essential (primary) hypertension   4. Obstructive apnea   5. Extreme obesity    [name], MD  [facility] Fritzi Mandes [place] as a scribe for [name], MD.,have documented all relevant documentation on the behalf of [name], MD,as directed by  [name], MD while in the presence of [name], MD.

## 2018-12-10 NOTE — Progress Notes (Signed)
Patient came in today to pick up custom made foot orthotics.  The goals were accomplished and the patient reported no dissatisfaction with said orthotics.  Patient was advised of breakin period and how to report any issues. 

## 2018-12-10 NOTE — Patient Instructions (Signed)
FYI- *Take Pantoprazole every other day for *   Exercising to Stay Healthy To become healthy and stay healthy, it is recommended that you do moderate-intensity and vigorous-intensity exercise. You can tell that you are exercising at a moderate intensity if your heart starts beating faster and you start breathing faster but can still hold a conversation. You can tell that you are exercising at a vigorous intensity if you are breathing much harder and faster and cannot hold a conversation while exercising. Exercising regularly is important. It has many health benefits, such as:  Improving overall fitness, flexibility, and endurance.  Increasing bone density.  Helping with weight control.  Decreasing body fat.  Increasing muscle strength.  Reducing stress and tension.  Improving overall health. How often should I exercise? Choose an activity that you enjoy, and set realistic goals. Your health care provider can help you make an activity plan that works for you. Exercise regularly as told by your health care provider. This may include:  Doing strength training two times a week, such as: ? Lifting weights. ? Using resistance bands. ? Push-ups. ? Sit-ups. ? Yoga.  Doing a certain intensity of exercise for a given amount of time. Choose from these options: ? A total of 150 minutes of moderate-intensity exercise every week. ? A total of 75 minutes of vigorous-intensity exercise every week. ? A mix of moderate-intensity and vigorous-intensity exercise every week. Children, pregnant women, people who have not exercised regularly, people who are overweight, and older adults may need to talk with a health care provider about what activities are safe to do. If you have a medical condition, be sure to talk with your health care provider before you start a new exercise program. What are some exercise ideas? Moderate-intensity exercise ideas include:  Walking 1 mile (1.6 km) in about 15  minutes.  Biking.  Hiking.  Golfing.  Dancing.  Water aerobics. Vigorous-intensity exercise ideas include:  Walking 4.5 miles (7.2 km) or more in about 1 hour.  Jogging or running 5 miles (8 km) in about 1 hour.  Biking 10 miles (16.1 km) or more in about 1 hour.  Lap swimming.  Roller-skating or in-line skating.  Cross-country skiing.  Vigorous competitive sports, such as football, basketball, and soccer.  Jumping rope.  Aerobic dancing. What are some everyday activities that can help me to get exercise?  [place] work, such as: ? Pushing a Conservation officer, nature. ? Raking and bagging leaves.  Washing your car.  Pushing a stroller.  Shoveling snow.  Gardening.  Washing windows or floors. How can I be more active in my day-to-day activities?  Use stairs instead of an elevator.  Take a walk during your lunch break.  If you drive, park your car farther away from your work or school.  If you take public transportation, get off one stop early and walk the rest of the way.  Stand up or walk around during all of your indoor phone calls.  Get up, stretch, and walk around every 30 minutes throughout the day.  Enjoy exercise with a friend. Support to continue exercising will help you keep a regular routine of activity. What guidelines can I follow while exercising?  Before you start a new exercise program, talk with your health care provider.  Do not exercise so much that you hurt yourself, feel dizzy, or get very short of breath.  Wear comfortable clothes and wear shoes with good support.  Drink plenty of water while you exercise to prevent  dehydration or heat stroke.  Work out until your breathing and your heartbeat get faster. Where to find more information  U.S. Department of Health and Human Services: [employer].at  Centers for Disease Control and Prevention (CDC): http://www.wolf.info/ Summary  Exercising regularly is important. It will improve your overall fitness,  flexibility, and endurance.  Regular exercise also will improve your overall health. It can help you control your weight, reduce stress, and improve your bone density.  Do not exercise so much that you hurt yourself, feel dizzy, or get very short of breath.  Before you start a new exercise program, talk with your health care provider. This information is not intended to replace advice given to you by your health care provider. Make sure you discuss any questions you have with your health care provider. Document Released: 07/21/2010 Document Revised: 05/09/2017 Document Reviewed: 05/09/2017 Elsevier Interactive Patient Education  2019 [employer].

## 2018-12-10 NOTE — Progress Notes (Signed)
She presents today for follow-up of ulcerations to the medial aspect of her great toes.  She is also complaining of thick painful toenails.  Objective: Vital signs are stable alert and oriented x3.  Pulses are palpable.  Reactive hyperkeratosis surrounding the the ulcerative areas once debrided today does not demonstrate any open ulceration does not probe there is no bleeding.  Toenails are thick yellow dystrophic clinically mycotic.  Assessment: Well-healing ulcerations hallux interphalangeal joint and plantar aspect of the fifth metatarsal right foot.  Pain in limb secondary to onychomycosis.  Plan: Debridement of all reactive hyperkeratotic tissue ulceration and toenails.  Dispensed orthotics and insoles today to see how she does with this over the next month should anything start to break open or draining she will notify us immediately.

## 2018-12-26 ENCOUNTER — Other Ambulatory Visit: Payer: Self-pay | Admitting: Family Medicine

## 2018-12-26 DIAGNOSIS — E119 Type 2 diabetes mellitus without complications: Secondary | ICD-10-CM

## 2019-01-12 ENCOUNTER — Ambulatory Visit: Payer: BC Managed Care – PPO | Admitting: Podiatry

## 2019-01-12 ENCOUNTER — Other Ambulatory Visit: Payer: Self-pay

## 2019-01-12 ENCOUNTER — Encounter: Payer: Self-pay | Admitting: Podiatry

## 2019-01-12 DIAGNOSIS — E1142 Type 2 diabetes mellitus with diabetic polyneuropathy: Secondary | ICD-10-CM | POA: Diagnosis not present

## 2019-01-12 DIAGNOSIS — E11621 Type 2 diabetes mellitus with foot ulcer: Secondary | ICD-10-CM | POA: Diagnosis not present

## 2019-01-12 DIAGNOSIS — L97511 Non-pressure chronic ulcer of other part of right foot limited to breakdown of skin: Secondary | ICD-10-CM | POA: Diagnosis not present

## 2019-01-12 MED ORDER — DOXYCYCLINE HYCLATE 100 MG PO TABS: 100.0000 mg | ORAL_TABLET | Freq: Two times a day (BID) | ORAL | 0 refills | Status: DC

## 2019-01-12 NOTE — Progress Notes (Signed)
This patient presents to the office for an open wound that has developed under the outside ball of her right foot.  She has been seen Dr. Milinda Pointer who has been treating her for ulcerations on both big toes.  Those big toes are looking good with no evidence of any drainage noted today.  She says she obtained a pair of orthoses from [facility] and has been wearing them as directed.  She says 1 day she wore them greater than 5 hours and she noted there was blood in the bottom of her right shoe.  She says that the blister has turned into an open wound on the outside of her right foot since Friday.  This patient states that she is neuropathic and not having much pain at the site of the open wound/blister.  She presents the office today for an evaluation and treatment of this open wound blister right foot.  Vascular  Dorsalis pedis and posterior tibial pulses are palpable  B/L.  Capillary return  WNL.  Temperature gradient is  WNL.  Skin turgor  WNL  Sensorium  Senn Weinstein monofilament wire  Diminished . Diminished  tactile sensation.  Nail Exam  Patient has thick disfigured discolored nails  B/l.  Orthopedic  Exam  Muscle tone and muscle strength  WNL.  No limitations of motion feet  B/L.  No crepitus or joint effusion noted.  Foot type is unremarkable and digits show no abnormalities.  Plantar flex 5th met  B/l  Skin  No open lesions noted hallux  B/L.  There is an 8 mm.x 8 mm. Ulcer with necrotic tissue noted sub 5th met  Right foot.  There is redness noted sub-2,3, 4 of the plantar aspect of the right foot .  There is a callus noted sub 5th metatarsal left foot.  Diabetic ulcer sub 5th right foot.  Diabetic neuropathy.   ROV.  Debride necrotic tissue  Bandage with silvadene/ DSD.  Padding applied to her shoes.  Told to perform home soaks.  Prescribe doxycycline  # 10.  Told her to bring her orthoses to her next appointment.  Since she already has a scheduled appointment with Dr. Milinda Pointer I told her to be seen by  Dr. Milinda Pointer and allow him to follow-up on her blister/ulcer.  Gardiner Barefoot DPM

## 2019-01-14 ENCOUNTER — Ambulatory Visit: Payer: BC Managed Care – PPO | Admitting: Podiatry

## 2019-01-14 ENCOUNTER — Other Ambulatory Visit: Payer: Self-pay

## 2019-01-14 ENCOUNTER — Encounter: Payer: Self-pay | Admitting: Podiatry

## 2019-01-14 VITALS — Temp 97.2°F

## 2019-01-14 DIAGNOSIS — L97511 Non-pressure chronic ulcer of other part of right foot limited to breakdown of skin: Secondary | ICD-10-CM | POA: Diagnosis not present

## 2019-01-14 DIAGNOSIS — E11621 Type 2 diabetes mellitus with foot ulcer: Secondary | ICD-10-CM | POA: Diagnosis not present

## 2019-01-14 NOTE — Progress Notes (Signed)
She presents today for follow-up of ulceration sub-fifth met head right.  Denies fever chills nausea vomiting muscle aches and pains.  Has been taking her doxycycline and applying Neosporin as instructed as well as soaking twice daily.  Objective: Vital signs are stable alert and oriented x3.  There is no erythema or cellulitis.  Ulceration does demonstrate considerable undermining when he first look at it appears to be about 2 cm in diameter however after debriding all of the undermined tissue noting that it does not probe deep to bone is probably in total of about 10 cm.  Assessment diabetic ulceration sub-fifth right without infection.  Plan: Finish up her antibiotics start applications of Iodosorb after soaking and cleaning the foot on every other day basis continue the use of the Darco shoe dressed daily follow-up with me in 1 week

## 2019-01-21 ENCOUNTER — Ambulatory Visit: Payer: BC Managed Care – PPO | Admitting: Podiatry

## 2019-01-21 ENCOUNTER — Other Ambulatory Visit: Payer: Self-pay

## 2019-01-21 ENCOUNTER — Encounter: Payer: Self-pay | Admitting: Podiatry

## 2019-01-21 VITALS — Temp 97.7°F

## 2019-01-21 DIAGNOSIS — E11621 Type 2 diabetes mellitus with foot ulcer: Secondary | ICD-10-CM

## 2019-01-21 DIAGNOSIS — L97511 Non-pressure chronic ulcer of other part of right foot limited to breakdown of skin: Secondary | ICD-10-CM | POA: Diagnosis not present

## 2019-01-21 NOTE — Progress Notes (Signed)
She presents today for follow-up of her ulceration plantar aspect and plantar lateral aspect of the fifth metatarsal phalangeal joint.  States that she has been using her Neosporin on the overall edges and the Iodosorb gel and the hole in the bottom.  She denies fever chills nausea vomiting muscle aches and pains.  Objective: Vital signs are stable she is alert and oriented x3.  Pulses are palpable.  Ulcerative lesion demonstrates has decreased in size plantar aspect to 0.5 cm x 1.0 cm in size and measures 0.2 cm in depth.  The superficial ulceration or skin debridement of her ulcer has condensed considerably from its encompassing the fifth toe.  Assessment slowly healing diabetic ulceration plantar aspect fifth right non-complicated.  Plan: Redressed today dressed a compressive dressing after debridement.  Debrided bleeding no purulence no malodor.  Follow-up with her in 2 weeks call sooner if needed

## 2019-01-22 ENCOUNTER — Ambulatory Visit: Payer: BC Managed Care – PPO | Admitting: Podiatry

## 2019-02-01 ENCOUNTER — Other Ambulatory Visit: Payer: Self-pay | Admitting: Family Medicine

## 2019-02-01 DIAGNOSIS — E7800 Pure hypercholesterolemia, unspecified: Secondary | ICD-10-CM

## 2019-02-04 ENCOUNTER — Other Ambulatory Visit: Payer: Self-pay

## 2019-02-04 ENCOUNTER — Encounter: Payer: Self-pay | Admitting: Podiatry

## 2019-02-04 ENCOUNTER — Ambulatory Visit: Payer: BC Managed Care – PPO | Admitting: Podiatry

## 2019-02-04 VITALS — Temp 97.9°F

## 2019-02-04 DIAGNOSIS — L97511 Non-pressure chronic ulcer of other part of right foot limited to breakdown of skin: Secondary | ICD-10-CM | POA: Diagnosis not present

## 2019-02-04 DIAGNOSIS — E1142 Type 2 diabetes mellitus with diabetic polyneuropathy: Secondary | ICD-10-CM

## 2019-02-04 DIAGNOSIS — E11621 Type 2 diabetes mellitus with foot ulcer: Secondary | ICD-10-CM

## 2019-02-04 NOTE — Progress Notes (Signed)
She returns today for follow-up of ulceration fifth metatarsal phalangeal joint area of the right foot.  Small amount of bleeding around the fifth toe denies fever chills nausea vomiting muscle aches and pains continues to dress the wound daily.  Objective: Vital signs are stable she is alert and oriented x3.  Pulses are palpable.  There is no erythema edema cellulitis drainage odor reactive hyperkeratotic tissue plantar wound was debrided to bleeding does not demonstrate any purulence no malodor all of the other wounds appear to be healing in nicely.  Assessment: Well-healing diabetic ulceration lateral fifth metatarsal right.  Plan: After debridement today placed to dressed a compressive dressing follow-up with her in a couple of weeks for reevaluation

## 2019-02-18 ENCOUNTER — Ambulatory Visit (INDEPENDENT_AMBULATORY_CARE_PROVIDER_SITE_OTHER): Payer: BC Managed Care – PPO | Admitting: Podiatry

## 2019-02-18 ENCOUNTER — Encounter: Payer: Self-pay | Admitting: Podiatry

## 2019-02-18 ENCOUNTER — Other Ambulatory Visit: Payer: Self-pay

## 2019-02-18 VITALS — Temp 98.2°F

## 2019-02-18 DIAGNOSIS — L97511 Non-pressure chronic ulcer of other part of right foot limited to breakdown of skin: Secondary | ICD-10-CM | POA: Diagnosis not present

## 2019-02-18 DIAGNOSIS — E11621 Type 2 diabetes mellitus with foot ulcer: Secondary | ICD-10-CM

## 2019-02-18 NOTE — Progress Notes (Signed)
She presents today for follow-up of ulceration to the right foot.  States that seems to be doing much better.  She denies fever chills nausea vomiting muscle aches pains calf pain back pain chest pain shortness of breath.  Objective: There is no erythema edema cellulitis drainage or odor.  Appears to be healing very nicely the ulcerative lesion no longer probes deep and has a floor of dermis.  No purulence no malodor no signs of infection.  Assessment: Well-healing diabetic ulceration right.  Plan: Debrided all reactive hyperkeratotic tissue today will allow her back into regular shoe gear.  Follow-up with her in a couple weeks.

## 2019-02-25 ENCOUNTER — Ambulatory Visit: Payer: BC Managed Care – PPO | Admitting: Podiatry

## 2019-03-11 ENCOUNTER — Encounter: Payer: Self-pay | Admitting: Podiatry

## 2019-03-11 ENCOUNTER — Other Ambulatory Visit: Payer: Self-pay

## 2019-03-11 ENCOUNTER — Ambulatory Visit (INDEPENDENT_AMBULATORY_CARE_PROVIDER_SITE_OTHER): Payer: BC Managed Care – PPO | Admitting: Podiatry

## 2019-03-11 DIAGNOSIS — L97511 Non-pressure chronic ulcer of other part of right foot limited to breakdown of skin: Secondary | ICD-10-CM | POA: Diagnosis not present

## 2019-03-11 DIAGNOSIS — E11621 Type 2 diabetes mellitus with foot ulcer: Secondary | ICD-10-CM

## 2019-03-11 MED ORDER — MUPIROCIN 2 % EX OINT: TOPICAL_OINTMENT | CUTANEOUS | 1 refills | Status: DC

## 2019-03-11 NOTE — Progress Notes (Signed)
She presents today for follow-up of sub-fifth ulcer of her right foot.  States that is it was looking better but it seems like it has a little bit of bogginess in it today.  Objective: Vital signs are stable she is alert and oriented x3.  Pulses are palpable.  Once started debriding the area today was noticed that it is the skin was floating once again on a thick red bed of purulence.  This area was sampled today and sent for culture and sensitivity.  I removed all necrotic tissue.  Assessment: Plantarflexed fifth metatarsal head resulting in chronic ulceration fifth right.  Plan: Discussed the need for hallux IPJ fusion left foot.  Also discussed the need for 1/5 metatarsal head resection right foot.  Also encouraged her to continue all current current therapies including Bactroban ointment which I went ahead and reordered today I want she will use Bactroban 1 day and Iodosorb the next.  Follow-up with her in 2 weeks

## 2019-03-12 ENCOUNTER — Telehealth: Payer: Self-pay | Admitting: *Deleted

## 2019-03-12 NOTE — Telephone Encounter (Signed)
I spoke with patient and clarified that she will soak her foot once a day, dry well then alternate daily Bactroban and Iodosorb  She verbalized instructions.

## 2019-03-12 NOTE — Telephone Encounter (Signed)
Pt states she was seen yesterday by Dr. Milinda Pointer and she needed clarification on the dressing changes.

## 2019-03-16 ENCOUNTER — Other Ambulatory Visit: Payer: Self-pay | Admitting: Family Medicine

## 2019-03-16 DIAGNOSIS — E119 Type 2 diabetes mellitus without complications: Secondary | ICD-10-CM

## 2019-03-25 ENCOUNTER — Ambulatory Visit: Payer: BC Managed Care – PPO | Admitting: Podiatry

## 2019-03-25 ENCOUNTER — Other Ambulatory Visit: Payer: Self-pay

## 2019-03-25 ENCOUNTER — Encounter: Payer: Self-pay | Admitting: Podiatry

## 2019-03-25 DIAGNOSIS — E11621 Type 2 diabetes mellitus with foot ulcer: Secondary | ICD-10-CM

## 2019-03-25 DIAGNOSIS — L97511 Non-pressure chronic ulcer of other part of right foot limited to breakdown of skin: Secondary | ICD-10-CM | POA: Diagnosis not present

## 2019-03-25 DIAGNOSIS — M79676 Pain in unspecified toe(s): Secondary | ICD-10-CM

## 2019-03-25 DIAGNOSIS — B351 Tinea unguium: Secondary | ICD-10-CM | POA: Diagnosis not present

## 2019-03-26 ENCOUNTER — Encounter: Payer: Self-pay | Admitting: Podiatry

## 2019-03-26 NOTE — Progress Notes (Signed)
She presents today for follow-up of chronic ulceration is plantar lateral aspect of the right foot and left hallux interphalangeal joint.  She states it seems to be doing much better now she continues to wear her Darco shoes in place dressings daily.  Objective: Vital signs are stable she is alert oriented x3 there is no erythema erythema cellulitis drainage odor pulses remain palpable.  Reactive hyper keratomas are present.  Her toenails are long thick yellow dystrophic onychomycotic and treated all the reactive hyperkeratotic tissue did not notice any opening to ulceration.  No signs of infection.  Assessment: Diabetic ulcerations appear to be healing quite nicely.  Pain limb secondary to onychomycosis.  Plan: Discussed etiology pathology and surgical therapies debrided all reactive hyperkeratotic tissue noticed no bleeding no ulceration is present at this time.  Debrided toenails 1 through 5 bilaterally.  I will allow her to get back into regular shoes making sure that they are wide enough.  If she notices any breakdown or any change in tissue shoes and let us know immediately.  Follow-up with her in a couple weeks.

## 2019-04-01 ENCOUNTER — Other Ambulatory Visit (HOSPITAL_COMMUNITY)
Admission: RE | Admit: 2019-04-01 | Discharge: 2019-04-01 | Disposition: A | Discharge status: Home / Self Care | Payer: BC Managed Care – PPO | Source: Ambulatory Visit | Attending: Family Medicine | Admitting: Family Medicine

## 2019-04-01 ENCOUNTER — Ambulatory Visit (INDEPENDENT_AMBULATORY_CARE_PROVIDER_SITE_OTHER): Payer: BC Managed Care – PPO | Admitting: Family Medicine

## 2019-04-01 ENCOUNTER — Encounter: Payer: Self-pay | Admitting: Family Medicine

## 2019-04-01 ENCOUNTER — Other Ambulatory Visit: Payer: Self-pay

## 2019-04-01 VITALS — BP 132/74 | HR 74 | Resp 16 | Ht 68.0 in | Wt 266.0 lb

## 2019-04-01 DIAGNOSIS — E119 Type 2 diabetes mellitus without complications: Secondary | ICD-10-CM

## 2019-04-01 DIAGNOSIS — Z0000 Encounter for general adult medical examination without abnormal findings: Secondary | ICD-10-CM | POA: Diagnosis not present

## 2019-04-01 DIAGNOSIS — Z124 Encounter for screening for malignant neoplasm of cervix: Secondary | ICD-10-CM | POA: Diagnosis present

## 2019-04-01 DIAGNOSIS — Z23 Encounter for immunization: Secondary | ICD-10-CM

## 2019-04-01 NOTE — Progress Notes (Signed)
Patient: Karen Stephenson, Female    DOB: June 30, 1956, 63 y.o.   MRN: [id number] Visit Date: 04/01/2019  Today's Provider: [name], MD   Chief Complaint  Patient presents with  . Annual Exam   Subjective:     Annual physical exam Karen Stephenson is a 63 y.o. female who presents today for health maintenance and complete physical. She feels well. She reports exercising not regularly, but she does stay active. She reports she is sleeping well. Her feet are hurting last that she is being followed and treated by Dr. Milinda Pointer from podiatry. Colonoscopy- 07/17/2017. Dr. Vira Agar. Internal hemorrhoids, otherwise normal. Repeat 10 years.  Mammogram- 04/13/2017. Normal.  Pap- 07/15/2014. Normal.   Immunization History  Administered Date(s) Administered  . Influenza Whole 04/23/2017  . Influenza,inj,Quad PF,6+ Mos 03/26/2018  . Pneumococcal Polysaccharide-23 02/29/2012  . Td 08/17/2003  . Tdap 04/18/2011    Review of Systems  Constitutional: Negative.   HENT: Negative.   Eyes: Negative.   Respiratory: Negative.   Cardiovascular: Negative.   Gastrointestinal: Negative.   Endocrine: Negative.   Genitourinary: Negative.   Musculoskeletal: Negative.   Skin: Negative.   Allergic/Immunologic: Negative.   Neurological: Negative.   Hematological: Negative.   Psychiatric/Behavioral: Negative.     Social History      She  reports that she quit smoking about 5 years ago. Her smoking use included cigarettes. She has never used smokeless tobacco. She reports that she does not drink alcohol or use drugs.       Social History   Socioeconomic History  . Marital status: Married    Spouse name: Not on file  . Number of children: Not on file  . Years of education: Not on file  . Highest education level: Not on file  Occupational History  . Not on file  Social Needs  . Financial resource strain: Not on file  . Food insecurity    Worry: Not on file    Inability: Not on file   . Transportation needs    Medical: Not on file    Non-medical: Not on file  Tobacco Use  . Smoking status: Former Smoker    Types: Cigarettes    Quit date: 07/01/2013    Years since quitting: 5.7  . Smokeless tobacco: Never Used  Substance and Sexual Activity  . Alcohol use: No  . Drug use: No  . Sexual activity: Not on file  Lifestyle  . Physical activity    Days per week: Not on file    Minutes per session: Not on file  . Stress: Not on file  Relationships  . Social Herbalist on phone: Not on file    Gets together: Not on file    Attends religious service: Not on file    Active member of club or organization: Not on file    Attends meetings of clubs or organizations: Not on file    Relationship status: Not on file  Other Topics Concern  . Not on file  Social History Narrative  . Not on file    Past Medical History:  Diagnosis Date  . Anginal pain ([place])   . Asthma   . Diabetes mellitus without complication ([place])   . GERD (gastroesophageal reflux disease)   . Hypercholesteremia   . Hypertension   . Hypokalemia   . Sleep apnea      Patient Active Problem List   Diagnosis Date Noted  .  Diabetic foot ulcer ([place]) 01/12/2019  . Chronic venous insufficiency 12/01/2018  . Lower extremity ulceration, unspecified laterality, with fat layer exposed ([place]) 12/01/2018  . Diabetes mellitus without complication ([place]) [id number]  . Airway hyperreactivity 12/13/2014  . Acid reflux 12/13/2014  . Hypercholesteremia 12/13/2014  . Decreased potassium in the blood 12/13/2014  . Cramps of lower extremity 12/13/2014  . Extreme obesity 12/13/2014  . Avitaminosis D 12/13/2014  . Dermatophytic onychia 12/24/2013  . Bony exostosis 12/24/2013  . Unstable angina pectoris ([place]) 12/24/2013  . Polyneuropathy 12/24/2013  . Family history of breast cancer 03/01/2008  . Obstructive apnea 12/26/2007  . Diaphragmatic hernia 10/08/2006  . Allergic rhinitis, seasonal 10/08/2006   . Diabetes mellitus, type 2 ([place]) 10/08/2006  . Essential (primary) hypertension 12/29/1999    Past Surgical History:  Procedure Laterality Date  . BACK SURGERY  1986  . COLONOSCOPY WITH PROPOFOL N/A 07/17/2017   Procedure: COLONOSCOPY WITH PROPOFOL;  Surgeon: Manya Silvas, MD;  Location: [facility] ENDOSCOPY;  Service: Endoscopy;  Laterality: N/A;  . NASAL SINUS SURGERY    . UTERINE FIBROID SURGERY      Family History        Family Status  Relation Name Status  . Mother  Deceased  . Father  Deceased at age 70  . Sister  Alive  . PGF  (Not Specified)  . Brother  Alive        Her family history includes Breast cancer (age of onset: 37) in her mother; Breast cancer (age of onset: 93) in her sister; Diabetes in her mother; Heart disease in her father and paternal grandfather; Hypertension in her brother.      Allergies  Allergen Reactions  . Amoxicillin-Pot Clavulanate     no further information given.  . Aspirin   . Ducodyl [Bisacodyl]   . Erythromycin     Other reaction(s): Stomach Ache Stomach pain.  . Glipizide   . Metformin Hcl   . Macrobid [Nitrofurantoin]     Severe diarrhea, severe abdominal pain/cramping     Current Outpatient Medications:  .  acarbose (PRECOSE) 25 MG tablet, Take 1 tablet (25 mg total) by mouth 3 (three) times daily with meals., Disp: 90 tablet, Rfl: 3 .  acarbose (PRECOSE) 25 MG tablet, TAKE 1 TABLET BY MOUTH THREE TIMES DAILY WITH MEALS, Disp: 270 tablet, Rfl: 0 .  albuterol (PROVENTIL HFA) 108 (90 BASE) MCG/ACT inhaler, Inhale 1-2 puffs into the lungs as needed. , Disp: , Rfl:  .  BIOTIN PO, Take 1 tablet by mouth daily., Disp: , Rfl:  .  cadexomer iodine (IODOSORB) 0.9 % gel, Apply 1 application topically daily., Disp: 40 g, Rfl: 3 .  Cetirizine HCl (ZYRTEC ALLERGY) 10 MG CAPS, Take 1 capsule by mouth daily. , Disp: , Rfl:  .  doxycycline (VIBRA-TABS) 100 MG tablet, Take 1 tablet (100 mg total) by mouth 2 (two) times daily., Disp: 20 tablet,  Rfl: 0 .  fluticasone (FLONASE) 50 MCG/ACT nasal spray, USE 2 SPRAY(S) IN EACH NOSTRIL ONCE DAILY, Disp: 16 g, Rfl: 11 .  Fluticasone-Salmeterol (ADVAIR) 250-50 MCG/DOSE AEPB, INHALE ONE DOSE BY MOUTH AS NEEDED, Disp: 60 each, Rfl: 12 .  glucose blood test strip, Check sugar once daily, E 11.9, Disp: 25 each, Rfl: 12 .  guaiFENesin-codeine 100-10 MG/5ML syrup, Take 5 mLs by mouth 3 (three) times daily as needed for cough., Disp: 120 mL, Rfl: 0 .  Homeopathic Products (LEG CRAMP RELIEF PO), Take 2 capsules by mouth daily. , Disp: ,  Rfl:  .  hydrochlorothiazide (HYDRODIURIL) 25 MG tablet, Take 1 tablet (25 mg total) by mouth daily., Disp: 90 tablet, Rfl: 3 .  hyoscyamine (LEVBID) 0.375 MG 12 hr tablet, Take 1 tablet (0.375 mg total) by mouth as needed., Disp: 60 tablet, Rfl: 5 .  JANUVIA 100 MG tablet, Take 1 tablet by mouth once daily, Disp: 90 tablet, Rfl: 3 .  JARDIANCE 10 MG TABS tablet, Take 1 tablet by mouth once daily, Disp: 90 tablet, Rfl: 3 .  Lancets (ONETOUCH ULTRASOFT) lancets, Check sugar once daily ,DX E11.9, Disp: 30 each, Rfl: 12 .  losartan (COZAAR) 100 MG tablet, Take 1 tablet (100 mg total) by mouth daily., Disp: 90 tablet, Rfl: 3 .  montelukast (SINGULAIR) 10 MG tablet, TAKE 1 TABLET BY MOUTH AT BEDTIME, Disp: 90 tablet, Rfl: 3 .  mupirocin ointment (BACTROBAN) 2 %, Apply to wound after soaking BID, Disp: 30 g, Rfl: 1 .  pantoprazole (PROTONIX) 40 MG tablet, TAKE 1 TABLET BY MOUTH ONCE DAILY, Disp: 90 tablet, Rfl: 3 .  rosuvastatin (CRESTOR) 20 MG tablet, Take 1 tablet by mouth once daily, Disp: 90 tablet, Rfl: 3 .  vitamin B-12 (CYANOCOBALAMIN) 1000 MCG tablet, Take 1,000 mcg by mouth daily., Disp: , Rfl:  .  Vitamin D, Ergocalciferol, (DRISDOL) 1.25 MG (50000 UT) CAPS capsule, TAKE 1 CAPSULE BY MOUTH ONCE A WEEK, Disp: 4 capsule, Rfl: 12   Patient Care Team: Jerrol Banana., MD as PCP - General (Family Medicine)    Objective:    Vitals: BP 132/74   Pulse 74    Resp 16   Ht 5\' 8"  (1.727 m)   Wt 266 lb (120.7 kg)   SpO2 99%   BMI 40.45 kg/m    Vitals:   04/01/19 1045  BP: 132/74  Pulse: 74  Resp: 16  SpO2: 99%  Weight: 266 lb (120.7 kg)  Height: 5\' 8"  (1.727 m)     Physical Exam Vitals signs and nursing note reviewed.  Constitutional:      Appearance: She is well-developed. She is obese.  HENT:     Head: Normocephalic and atraumatic.     Right Ear: External ear normal.     Left Ear: External ear normal.  Eyes:     General: No scleral icterus.    Conjunctiva/sclera: Conjunctivae normal.  Neck:     Thyroid: No thyromegaly.     Vascular: No carotid bruit.  Cardiovascular:     Rate and Rhythm: Normal rate and regular rhythm.     Pulses: Normal pulses.     Heart sounds: Normal heart sounds.  Pulmonary:     Effort: Pulmonary effort is normal.     Breath sounds: Normal breath sounds.  Chest:     Breasts:        Right: No swelling, bleeding, inverted nipple, mass, nipple discharge, skin change or tenderness.        Left: No swelling, bleeding, inverted nipple, mass, nipple discharge, skin change or tenderness.  Abdominal:     Palpations: Abdomen is soft.  Genitourinary:    General: Normal vulva.     Comments: Mild cystocele noted. Lymphadenopathy:     Cervical: No cervical adenopathy.  Skin:    General: Skin is warm and dry.  Neurological:     Mental Status: She is alert and oriented to person, place, and time.  Psychiatric:        Behavior: Behavior normal.        Thought Content: Thought  content normal.        Judgment: Judgment normal.      Depression Screen PHQ 2/9 Scores 03/26/2018 03/26/2018 11/20/2017 11/14/2016  PHQ - 2 Score 0 0 0 0  PHQ- 9 Score - 0 - 0       Assessment & Plan:     Routine Health Maintenance and Physical Exam  Exercise Activities and Dietary recommendations Goals    . Exercise 150 minutes per week (moderate activity)        Health Maintenance  Topic Date Due  . HIV Screening   05/03/1969  . OPHTHALMOLOGY EXAM  07/02/2014  . PAP SMEAR-Modifier  07/15/2017  . INFLUENZA VACCINE  01/31/2019  . MAMMOGRAM  04/04/2019  . HEMOGLOBIN A1C  06/11/2019  . FOOT EXAM  01/12/2020  . TETANUS/TDAP  04/17/2021  . COLONOSCOPY  07/18/2027  . Hepatitis C Screening  Completed     Discussed health benefits of physical activity, and encouraged her to engage in regular exercise appropriate for her age and condition.   1. Annual physical exam  - CBC with Differential/Platelet - Comprehensive metabolic panel - Lipid panel - TSH  2. Type 2 diabetes mellitus without complication, without long-term current use of insulin (HCC)  - Hemoglobin A1c  3. Cervical cancer screening Last Pap for this patient - Cytology - PAP  4. Need for influenza vaccination  - Flu Vaccine QUAD 6+ mos PF IM (Fluarix Quad PF)   [name], MD  [facility]

## 2019-04-02 LAB — COMPREHENSIVE METABOLIC PANEL
ALT: 22 IU/L (ref 0–32)
AST: 21 IU/L (ref 0–40)
Albumin/Globulin Ratio: 1.1 — ABNORMAL LOW (ref 1.2–2.2)
Albumin: 4 g/dL (ref 3.8–4.8)
Alkaline Phosphatase: 67 IU/L (ref 39–117)
BUN/Creatinine Ratio: 16 (ref 12–28)
BUN: 14 mg/dL (ref 8–27)
Bilirubin Total: 0.4 mg/dL (ref 0.0–1.2)
CO2: 22 mmol/L (ref 20–29)
Calcium: 9.6 mg/dL (ref 8.7–10.3)
Chloride: 103 mmol/L (ref 96–106)
Creatinine, Ser: 0.85 mg/dL (ref 0.57–1.00)
GFR calc Af Amer: 84 mL/min/{1.73_m2} (ref >59)
GFR calc non Af Amer: 73 mL/min/{1.73_m2} (ref >59)
Globulin, Total: 3.5 g/dL (ref 1.5–4.5)
Glucose: 147 mg/dL — ABNORMAL HIGH (ref 65–99)
Potassium: 3.6 mmol/L (ref 3.5–5.2)
Sodium: 140 mmol/L (ref 134–144)
Total Protein: 7.5 g/dL (ref 6.0–8.5)

## 2019-04-02 LAB — CBC WITH DIFFERENTIAL/PLATELET
Basophils Absolute: 0.1 10*3/uL (ref 0.0–0.2)
Basos: 1 %
EOS (ABSOLUTE): 0.1 10*3/uL (ref 0.0–0.4)
Eos: 2 %
Hematocrit: 40.1 % (ref 34.0–46.6)
Hemoglobin: 13 g/dL (ref 11.1–15.9)
Immature Grans (Abs): 0 10*3/uL (ref 0.0–0.1)
Immature Granulocytes: 0 %
Lymphocytes Absolute: 1.8 10*3/uL (ref 0.7–3.1)
Lymphs: 25 %
MCH: 27.3 pg (ref 26.6–33.0)
MCHC: 32.4 g/dL (ref 31.5–35.7)
MCV: 84 fL (ref 79–97)
Monocytes Absolute: 0.6 10*3/uL (ref 0.1–0.9)
Monocytes: 9 %
Neutrophils Absolute: 4.7 10*3/uL (ref 1.4–7.0)
Neutrophils: 63 %
Platelets: 264 10*3/uL (ref 150–450)
RBC: 4.77 x10E6/uL (ref 3.77–5.28)
RDW: 13.6 % (ref 11.7–15.4)
WBC: 7.3 10*3/uL (ref 3.4–10.8)

## 2019-04-02 LAB — LIPID PANEL
Chol/HDL Ratio: 3.6 ratio (ref 0.0–4.4)
Cholesterol, Total: 114 mg/dL (ref 100–199)
HDL: 32 mg/dL — ABNORMAL LOW (ref >39)
LDL Chol Calc (NIH): 61 mg/dL (ref 0–99)
Triglycerides: 114 mg/dL (ref 0–149)
VLDL Cholesterol Cal: 21 mg/dL (ref 5–40)

## 2019-04-02 LAB — HEMOGLOBIN A1C
Est. average glucose Bld gHb Est-mCnc: 192 mg/dL
Hgb A1c MFr Bld: 8.3 % — ABNORMAL HIGH (ref 4.8–5.6)

## 2019-04-02 LAB — TSH: TSH: 1.58 u[IU]/mL (ref 0.450–4.500)

## 2019-04-03 LAB — CYTOLOGY - PAP
Adequacy: ABSENT
Diagnosis: NEGATIVE

## 2019-04-06 ENCOUNTER — Telehealth: Payer: Self-pay

## 2019-04-06 NOTE — Telephone Encounter (Signed)
Pt advised.   Thanks,   -Laura  

## 2019-04-06 NOTE — Telephone Encounter (Signed)
Left message to call back  

## 2019-04-06 NOTE — Telephone Encounter (Signed)
-----   Message from Jerrol Banana., MD sent at 04/05/2019  9:26 AM EDT ----- Labs stable--continue to work on DM control.

## 2019-04-06 NOTE — Telephone Encounter (Signed)
Pt returned missed call. Please call pt back at [phone] [facility])  Thanks, [facility]

## 2019-04-08 ENCOUNTER — Other Ambulatory Visit: Payer: Self-pay | Admitting: Family Medicine

## 2019-04-08 ENCOUNTER — Ambulatory Visit: Payer: BC Managed Care – PPO | Admitting: Podiatry

## 2019-04-08 ENCOUNTER — Encounter: Payer: Self-pay | Admitting: Podiatry

## 2019-04-08 ENCOUNTER — Other Ambulatory Visit: Payer: Self-pay

## 2019-04-08 DIAGNOSIS — E11621 Type 2 diabetes mellitus with foot ulcer: Secondary | ICD-10-CM

## 2019-04-08 DIAGNOSIS — L97511 Non-pressure chronic ulcer of other part of right foot limited to breakdown of skin: Secondary | ICD-10-CM | POA: Diagnosis not present

## 2019-04-08 DIAGNOSIS — E119 Type 2 diabetes mellitus without complications: Secondary | ICD-10-CM

## 2019-04-08 NOTE — Progress Notes (Signed)
She presents today for follow-up of her ulceration sub-fifth right and interphalangeal joint left.  She states that it seems to be doing much better since she is got new shoes.  Objective: Vital signs are stable she is alert and oriented x3 minimal buildup of reactive hyperkeratotic tissue no erythema edema cellulitis drainage or odor.  Assessment: Well-healing diabetic ulcerations.  Plan: Encouraged her to get her blood sugar under better control encouraged her to wear her tennis shoes all of the time would like to follow-up with her in 1 month to make sure that she does not need need for debridement.

## 2019-04-26 ENCOUNTER — Other Ambulatory Visit: Payer: Self-pay | Admitting: Family Medicine

## 2019-04-26 DIAGNOSIS — K219 Gastro-esophageal reflux disease without esophagitis: Secondary | ICD-10-CM

## 2019-05-06 ENCOUNTER — Other Ambulatory Visit: Payer: Self-pay

## 2019-05-06 ENCOUNTER — Ambulatory Visit: Payer: Medicare Other | Admitting: Podiatry

## 2019-05-06 ENCOUNTER — Encounter: Payer: Self-pay | Admitting: Podiatry

## 2019-05-06 DIAGNOSIS — E11621 Type 2 diabetes mellitus with foot ulcer: Secondary | ICD-10-CM | POA: Diagnosis not present

## 2019-05-06 DIAGNOSIS — L97511 Non-pressure chronic ulcer of other part of right foot limited to breakdown of skin: Secondary | ICD-10-CM

## 2019-05-06 DIAGNOSIS — E1142 Type 2 diabetes mellitus with diabetic polyneuropathy: Secondary | ICD-10-CM

## 2019-05-06 NOTE — Progress Notes (Signed)
She presents today for follow-up of her ulcers bilateral foot.  Objective: Vital signs are stable she is alert and oriented x3 reactive hyperkeratotic tissue debrided today does not demonstrate any open wound.  No signs of infection.  Assessment: Well-healing diabetic ulcerations bilateral.  Plan: Follow-up with me in 6 weeks

## 2019-05-17 ENCOUNTER — Other Ambulatory Visit: Payer: Self-pay | Admitting: Family Medicine

## 2019-05-27 ENCOUNTER — Ambulatory Visit (INDEPENDENT_AMBULATORY_CARE_PROVIDER_SITE_OTHER): Payer: BC Managed Care – PPO | Admitting: Nurse Practitioner

## 2019-06-03 ENCOUNTER — Ambulatory Visit (INDEPENDENT_AMBULATORY_CARE_PROVIDER_SITE_OTHER): Payer: Medicare Other | Admitting: Nurse Practitioner

## 2019-06-06 ENCOUNTER — Other Ambulatory Visit: Payer: Self-pay | Admitting: Family Medicine

## 2019-06-06 DIAGNOSIS — J302 Other seasonal allergic rhinitis: Secondary | ICD-10-CM

## 2019-06-06 NOTE — Telephone Encounter (Signed)
Refused at this time. Last Vit D WNL on 11/06/16.

## 2019-06-06 NOTE — Telephone Encounter (Signed)
Flonase request approved per protocol.

## 2019-06-17 ENCOUNTER — Ambulatory Visit: Payer: Medicare Other | Admitting: Podiatry

## 2019-06-17 ENCOUNTER — Other Ambulatory Visit: Payer: Self-pay

## 2019-06-17 DIAGNOSIS — L97511 Non-pressure chronic ulcer of other part of right foot limited to breakdown of skin: Secondary | ICD-10-CM | POA: Diagnosis not present

## 2019-06-17 DIAGNOSIS — L97521 Non-pressure chronic ulcer of other part of left foot limited to breakdown of skin: Secondary | ICD-10-CM

## 2019-06-17 DIAGNOSIS — E1142 Type 2 diabetes mellitus with diabetic polyneuropathy: Secondary | ICD-10-CM

## 2019-06-17 NOTE — Progress Notes (Signed)
She presents today for follow-up of her preulcerative lesions to the hallux bilaterally.  States that her blood sugar is doing well.  Objective: Vital signs are stable she is alert and oriented x3.  Reactive hyperkeratotic lesions medial aspect of the hallux interphalangeal joints and subfifth metatarsal bilaterally still indicative of compression and irritation associated with tight shoe gear.  Currently upon debridement there is no purulence no malodor did not debride the bleeding.  Assessment: Preulcerative lesions hallux and fifth met bilateral.  Plan: Discussed wider shoe gear today debrided all reactive hyperkeratotic tissue follow-up with her in about 6 weeks

## 2019-07-07 ENCOUNTER — Telehealth: Payer: Self-pay

## 2019-07-07 NOTE — Telephone Encounter (Signed)
Copied from [place] [id number]. Topic: General - Other >> Jul 07, 2019  3:48 PM Percell Belt A wrote: Reason for CRM: pt called in and would like to know if she is suppose to still be taking the vit D?  If she she needs [facility] in [place] number [phone]

## 2019-07-07 NOTE — Telephone Encounter (Signed)
Patient advised to take OTC Vitamin D 2000 iu daily.

## 2019-07-08 ENCOUNTER — Other Ambulatory Visit: Payer: Self-pay

## 2019-07-08 ENCOUNTER — Ambulatory Visit (INDEPENDENT_AMBULATORY_CARE_PROVIDER_SITE_OTHER): Payer: Medicare PPO | Admitting: Nurse Practitioner

## 2019-07-08 ENCOUNTER — Encounter (INDEPENDENT_AMBULATORY_CARE_PROVIDER_SITE_OTHER): Payer: Self-pay | Admitting: Nurse Practitioner

## 2019-07-08 VITALS — BP 134/78 | HR 81 | Resp 16 | Wt 267.8 lb

## 2019-07-08 DIAGNOSIS — I10 Essential (primary) hypertension: Secondary | ICD-10-CM

## 2019-07-08 DIAGNOSIS — I872 Venous insufficiency (chronic) (peripheral): Secondary | ICD-10-CM | POA: Diagnosis not present

## 2019-07-08 DIAGNOSIS — L97902 Non-pressure chronic ulcer of unspecified part of unspecified lower leg with fat layer exposed: Secondary | ICD-10-CM | POA: Diagnosis not present

## 2019-07-12 ENCOUNTER — Encounter (INDEPENDENT_AMBULATORY_CARE_PROVIDER_SITE_OTHER): Payer: Self-pay | Admitting: Nurse Practitioner

## 2019-07-12 NOTE — Progress Notes (Signed)
SUBJECTIVE:  Patient ID: Karen Stephenson, female    DOB: Nov 06, 1955, 64 y.o.   MRN: [id number] Chief Complaint  Patient presents with  . Follow-up    64month follow up    HPI  Karen Stephenson is a 64 y.o. female the presents today for follow-up in regards to the patient's chronic venous insufficiency.  The patient was previously seen about 6 months ago in relation to ulcerations on her bilateral toes.  At this time the ulcerations are doing very well and mostly healed.  At this time the patient is actually able to wear regular shoes.  The patient was also found to have some extensive chronic venous insufficiency bilaterally.  The patient had some minimal edema at that time.  Since that time the patient endorses having minimal edema.  She denies any extensive lower extremity leg pain.  Patient denies any issues with her varicosities.  Overall the patient is doing very well.  Past Medical History:  Diagnosis Date  . Anginal pain ([place])   . Asthma   . Diabetes mellitus without complication ([place])   . GERD (gastroesophageal reflux disease)   . Hypercholesteremia   . Hypertension   . Hypokalemia   . Sleep apnea     Past Surgical History:  Procedure Laterality Date  . BACK SURGERY  1986  . COLONOSCOPY WITH PROPOFOL N/A 07/17/2017   Procedure: COLONOSCOPY WITH PROPOFOL;  Surgeon: Manya Silvas, MD;  Location: [facility] ENDOSCOPY;  Service: Endoscopy;  Laterality: N/A;  . NASAL SINUS SURGERY    . UTERINE FIBROID SURGERY      Social History   Socioeconomic History  . Marital status: Married    Spouse name: Not on file  . Number of children: Not on file  . Years of education: Not on file  . Highest education level: Not on file  Occupational History  . Not on file  Tobacco Use  . Smoking status: Former Smoker    Types: Cigarettes    Quit date: 07/01/2013    Years since quitting: 6.0  . Smokeless tobacco: Never Used  Substance and Sexual Activity  . Alcohol use: No  . Drug use:  No  . Sexual activity: Not on file  Other Topics Concern  . Not on file  Social History Narrative  . Not on file   Social Determinants of Health   Financial Resource Strain:   . Difficulty of Paying Living Expenses: Not on file  Food Insecurity:   . Worried About Charity fundraiser in the Last Year: Not on file  . Ran Out of Food in the Last Year: Not on file  Transportation Needs:   . Lack of Transportation (Medical): Not on file  . Lack of Transportation (Non-Medical): Not on file  Physical Activity:   . Days of Exercise per Week: Not on file  . Minutes of Exercise per Session: Not on file  Stress:   . Feeling of Stress : Not on file  Social Connections:   . Frequency of Communication with Friends and Family: Not on file  . Frequency of Social Gatherings with Friends and Family: Not on file  . Attends Religious Services: Not on file  . Active Member of Clubs or Organizations: Not on file  . Attends Archivist Meetings: Not on file  . Marital Status: Not on file  Intimate Partner Violence:   . Fear of Current or Ex-Partner: Not on file  . Emotionally Abused: Not on file  .  Physically Abused: Not on file  . Sexually Abused: Not on file    Family History  Problem Relation Age of Onset  . Diabetes Mother   . Breast cancer Mother 83  . Heart disease Father   . Breast cancer Sister 47       dx twice   . Heart disease Paternal Grandfather   . Hypertension Brother     Allergies  Allergen Reactions  . Amoxicillin-Pot Clavulanate     no further information given.  . Aspirin   . Ducodyl [Bisacodyl]   . Erythromycin     Other reaction(s): Stomach Ache Stomach pain.  . Glipizide   . Metformin Hcl   . Macrobid [Nitrofurantoin]     Severe diarrhea, severe abdominal pain/cramping     Review of Systems   Review of Systems: Negative Unless Checked Constitutional: [] Weight loss  [] Fever  [] Chills Cardiac: [] Chest pain   []  Atrial Fibrillation   [] Palpitations   [] Shortness of breath when laying flat   [] Shortness of breath with exertion. [] Shortness of breath at rest Vascular:  [] Pain in legs with walking   [] Pain in legs with standing [] Pain in legs when laying flat   [] Claudication    [] Pain in feet when laying flat    [] History of DVT   [] Phlebitis   [x] Swelling in legs   [] Varicose veins   [] Non-healing ulcers Pulmonary:   [] Uses home oxygen   [] Productive cough   [] Hemoptysis   [] Wheeze  [] COPD   [] Asthma Neurologic:  [] Dizziness   [] Seizures  [] Blackouts [] History of stroke   [] History of TIA  [] Aphasia   [] Temporary Blindness   [] Weakness or numbness in arm   [x] Weakness or numbness in leg Musculoskeletal:   [] Joint swelling   [] Joint pain   [] Low back pain  []  History of Knee Replacement [] Arthritis [] back Surgeries  []  Spinal Stenosis    Hematologic:  [] Easy bruising  [] Easy bleeding   [] Hypercoagulable state   [] Anemic Gastrointestinal:  [] Diarrhea   [] Vomiting  [] Gastroesophageal reflux/heartburn   [] Difficulty swallowing. [] Abdominal pain Genitourinary:  [] Chronic kidney disease   [] Difficult urination  [] Anuric   [] Blood in urine [] Frequent urination  [] Burning with urination   [] Hematuria Skin:  [] Rashes   [] Ulcers [] Wounds Psychological:  [] History of anxiety   []  History of major depression  []  Memory Difficulties      OBJECTIVE:   Physical Exam  BP 134/78 (BP Location: Right Arm)   Pulse 81   Resp 16   Wt 267 lb 12.8 oz (121.5 kg)   BMI 40.72 kg/m   Gen: WD/WN, NAD Head: [place]/AT, No temporalis wasting.  Ear/Nose/Throat: Hearing grossly intact, nares w/o erythema or drainage Eyes: PER, EOMI, sclera nonicteric.  Neck: Supple, no masses.  No JVD.  Pulmonary:  Good air movement, no use of accessory muscles.  Cardiac: RRR Vascular:  Minimal edema bilaterally Vessel Right Left  Radial Palpable Palpable   Gastrointestinal: soft, non-distended. No guarding/no peritoneal signs.  Musculoskeletal: M/S 5/5 throughout.   No deformity or atrophy.  Neurologic: Pain and light touch intact in extremities.  Symmetrical.  Speech is fluent. Motor exam as listed above. Psychiatric: Judgment intact, Mood & affect appropriate for pt's clinical situation. Dermatologic: No Venous rashes. No Ulcers Noted.  No changes consistent with cellulitis. Lymph : No Cervical lymphadenopathy, no lichenification or skin changes of chronic lymphedema.       ASSESSMENT AND PLAN:  1. Chronic venous insufficiency Patient does not have any significant issues due to her  bilateral varicosities or issues with edema.  Based on this, we will have the patient follow-up on an annual basis.  However if she should begin to develop symptoms she is more than welcome to contact her office and we will see her sooner.  2. Lower extremity ulceration, unspecified laterality, with fat layer exposed ([place]) This is doing well.  Patient should continue with prescribed treatment plan.  3. Essential (primary) hypertension Blood pressure has been controlled today.  On appropriate medications, no changes necessary.   Current Outpatient Medications on File Prior to Visit  Medication Sig Dispense Refill  . acarbose (PRECOSE) 25 MG tablet Take 1 tablet (25 mg total) by mouth 3 (three) times daily with meals. 90 tablet 3  . acarbose (PRECOSE) 25 MG tablet TAKE 1 TABLET BY MOUTH THREE TIMES DAILY WITH MEALS 270 tablet 3  . albuterol (PROVENTIL HFA) 108 (90 BASE) MCG/ACT inhaler Inhale 1-2 puffs into the lungs as needed.     Marland Kitchen BIOTIN PO Take 1 tablet by mouth daily.    . cadexomer iodine (IODOSORB) 0.9 % gel Apply 1 application topically daily. 40 g 3  . Cetirizine HCl (ZYRTEC ALLERGY) 10 MG CAPS Take 1 capsule by mouth daily.     . fluticasone (FLONASE) 50 MCG/ACT nasal spray Use 2 spray(s) in each nostril once daily 16 g 0  . Fluticasone-Salmeterol (ADVAIR) 250-50 MCG/DOSE AEPB INHALE ONE DOSE BY MOUTH AS NEEDED 60 each 12  . glucose blood test strip Check sugar  once daily, E 11.9 25 each 12  . guaiFENesin-codeine 100-10 MG/5ML syrup Take 5 mLs by mouth 3 (three) times daily as needed for cough. 120 mL 0  . Homeopathic Products (LEG CRAMP RELIEF PO) Take 2 capsules by mouth daily.     . hydrochlorothiazide (HYDRODIURIL) 25 MG tablet Take 1 tablet by mouth once daily 90 tablet 0  . hyoscyamine (LEVBID) 0.375 MG 12 hr tablet Take 1 tablet (0.375 mg total) by mouth as needed. 60 tablet 5  . JANUVIA 100 MG tablet Take 1 tablet by mouth once daily 90 tablet 3  . JARDIANCE 10 MG TABS tablet Take 1 tablet by mouth once daily 90 tablet 3  . Lancets (ONETOUCH ULTRASOFT) lancets Check sugar once daily ,DX E11.9 30 each 12  . losartan (COZAAR) 100 MG tablet Take 1 tablet by mouth once daily 90 tablet 0  . montelukast (SINGULAIR) 10 MG tablet TAKE 1 TABLET BY MOUTH AT BEDTIME 90 tablet 3  . mupirocin ointment (BACTROBAN) 2 % Apply to wound after soaking BID 30 g 1  . pantoprazole (PROTONIX) 40 MG tablet Take 1 tablet by mouth once daily 90 tablet 0  . rosuvastatin (CRESTOR) 20 MG tablet Take 1 tablet by mouth once daily 90 tablet 3  . vitamin B-12 (CYANOCOBALAMIN) 1000 MCG tablet Take 1,000 mcg by mouth daily.    . Vitamin D, Cholecalciferol, 50 MCG (2000 UT) CAPS Take by mouth daily.    . Vitamin D, Ergocalciferol, (DRISDOL) 1.25 MG (50000 UT) CAPS capsule TAKE 1 CAPSULE BY MOUTH ONCE A WEEK (Patient not taking: Reported on 07/08/2019) 4 capsule 12   No current facility-administered medications on file prior to visit.    There are no Patient Instructions on file for this visit. No follow-ups on file.   Kris Hartmann, NP  This note was completed with Sales executive.  Any errors are purely unintentional.

## 2019-07-13 ENCOUNTER — Telehealth: Payer: Self-pay | Admitting: *Deleted

## 2019-07-13 NOTE — Telephone Encounter (Signed)
Received fax from [facility] stating acarbarose is on back order. Pharmacy is requesting an alternative mediation. Please advise?

## 2019-07-14 MED ORDER — GLIMEPIRIDE 2 MG PO TABS: 2.0000 mg | ORAL_TABLET | Freq: Every day | ORAL | 1 refills | Status: DC

## 2019-07-14 NOTE — Telephone Encounter (Signed)
Glipiperide 2 mg daily.

## 2019-07-15 ENCOUNTER — Ambulatory Visit: Payer: Medicare Other | Admitting: Podiatry

## 2019-07-17 ENCOUNTER — Ambulatory Visit: Payer: Medicare PPO | Attending: Internal Medicine

## 2019-07-17 ENCOUNTER — Telehealth: Payer: Medicare PPO | Admitting: Family

## 2019-07-17 DIAGNOSIS — Z20822 Contact with and (suspected) exposure to covid-19: Secondary | ICD-10-CM | POA: Diagnosis not present

## 2019-07-17 MED ORDER — DEXAMETHASONE 6 MG PO TABS: 6.0000 mg | ORAL_TABLET | Freq: Every day | ORAL | 0 refills | Status: DC

## 2019-07-17 MED ORDER — BENZONATATE 100 MG PO CAPS: 100.0000 mg | ORAL_CAPSULE | Freq: Three times a day (TID) | ORAL | 0 refills | Status: DC | PRN

## 2019-07-17 MED ORDER — ALBUTEROL SULFATE HFA 108 (90 BASE) MCG/ACT IN AERS: 2.0000 | INHALATION_SPRAY | Freq: Four times a day (QID) | RESPIRATORY_TRACT | 0 refills | Status: DC | PRN

## 2019-07-17 MED ORDER — PROMETHAZINE-DM 6.25-15 MG/5ML PO SYRP: 5.0000 mL | ORAL_SOLUTION | Freq: Three times a day (TID) | ORAL | 0 refills | Status: DC | PRN

## 2019-07-17 NOTE — Progress Notes (Signed)
You have already been test for COVID-19 and more than likely positive, meaning that you were infected with the novel coronavirus and could give the germ to others.    You have been enrolled in [place] for COVID-19. Daily you will receive a questionnaire within the [facility] website. Our COVID-19 response team will be monitoring your responses daily.  Please continue isolation at home, for at least 10 days since the start of your symptoms and until you have had 24 hours with no fever (without taking a fever reducer) and with improving of symptoms.  Please continue good preventive care measures, including:  frequent hand-washing, avoid touching your face, cover coughs/sneezes, stay out of crowds and keep a 6 foot distance from others.  Recheck or go to the nearest hospital ED tent for re-assessment if fever/cough/breathlessness return. he following symptoms may appear 2-14 days after exposure: . Fever . Cough . Shortness of breath or difficulty breathing . Chills . Repeated shaking with chills . Muscle pain . Headache . Sore throat . New loss of taste or smell . Fatigue . Congestion or runny nose . Nausea or vomiting . Diarrhea  Go to the nearest hospital ED for assessment if fever/cough/breathlessness are severe or illness seems like a threat to life.  It is vitally important that if you feel that you have an infection such as this virus or any other virus that you stay home and away from places where you may spread it to others.  You should avoid contact with people age 2 and older.   You can use medication such as A prescription cough medication called Tessalon Perles 100 mg. You may take 1-2 capsules every 8 hours as needed for cough, A prescription inhaler called Albuterol MDI 90 mcg /actuation 2 puffs every 4 hours as needed for shortness of breath, wheezing, cough and A prescription cough medication called Phenergan DM 6.25 mg/15 mg. You make take one teaspoon / 5 ml every  4-6 hours as needed for cough, and dexamethasone 6 mg once day for 5 days.   You may also take acetaminophen (Tylenol) as needed for fever.  Reduce your risk of any infection by using the same precautions used for avoiding the common cold or flu:  Marland Kitchen Wash your hands often with soap and warm water for at least 20 seconds.  If soap and water are not readily available, use an alcohol-based hand sanitizer with at least 60% alcohol.  . If coughing or sneezing, cover your mouth and nose by coughing or sneezing into the elbow areas of your shirt or coat, into a tissue or into your sleeve (not your hands). . Avoid shaking hands with others and consider head nods or verbal greetings only. . Avoid touching your eyes, nose, or mouth with unwashed hands.  . Avoid close contact with people who are sick. . Avoid places or events with large numbers of people in one location, like concerts or sporting events. . Carefully consider travel plans you have or are making. . If you are planning any travel outside or inside the [place], visit the CDC's Travelers' Health webpage for the latest health notices. . If you have some symptoms but not all symptoms, continue to monitor at home and seek medical attention if your symptoms worsen. . If you are having a medical emergency, call 911.  HOME CARE . Only take medications as instructed by your medical team. . Drink plenty of fluids and get plenty of rest. . A steam or  ultrasonic humidifier can help if you have congestion.   GET HELP RIGHT AWAY IF YOU HAVE EMERGENCY WARNING SIGNS** FOR COVID-19. If you or someone is showing any of these signs seek emergency medical care immediately. Call 911 or proceed to your closest emergency facility if: . You develop worsening high fever. . Trouble breathing . Bluish lips or face . Persistent pain or pressure in the chest . New confusion . Inability to wake or stay awake . You cough up blood. . Your symptoms become more  severe  **This list is not all possible symptoms. Contact your medical provider for any symptoms that are sever or concerning to you.  MAKE SURE YOU   Understand these instructions.  Will watch your condition.  Will get help right away if you are not doing well or get worse.  Your e-visit answers were reviewed by a board certified advanced clinical practitioner to complete your personal care plan.  Depending on the condition, your plan could have included both over the counter or prescription medications.  If there is a problem please reply once you have received a response from your provider.  Your safety is important to [place].  If you have drug allergies check your prescription carefully.    You can use MyChart to ask questions about today's visit, request a non-urgent call back, or ask for a work or school excuse for 24 hours related to this e-Visit. If it has been greater than 24 hours you will need to follow up with your provider, or enter a new e-Visit to address those concerns. You will get an e-mail in the next two days asking about your experience.  I hope that your e-visit has been valuable and will speed your recovery. Thank you for using e-visits.    Approximately 5 minutes was spent documenting and reviewing patient's chart.

## 2019-07-18 LAB — NOVEL CORONAVIRUS, NAA: SARS-CoV-2, NAA: DETECTED — AB

## 2019-07-19 ENCOUNTER — Telehealth: Payer: Self-pay | Admitting: Nurse Practitioner

## 2019-07-19 NOTE — Telephone Encounter (Signed)
Called to discuss with Karen Stephenson about Covid symptoms and the use of bamlanivimab, a monoclonal antibody infusion for those with mild to moderate Covid symptoms and at a high risk of hospitalization.     Pt is qualified for this infusion at the [facility] infusion center due to co-morbid conditions and/or a member of an at-risk group, however declines infusion at this time. Symptoms tier reviewed as well as criteria for ending isolation.  Symptoms reviewed that would warrant ED/Hospital evaluation. Preventative practices reviewed. Patient verbalized understanding. Patient advised to call back if he decides that he does want to get infusion. Callback number to the infusion center given. Patient advised to go to Urgent care or ED with severe symptoms. Last date she} would be eligible for infusion is 07/26/2019.  Patient Active Problem List   Diagnosis Date Noted  . Diabetic foot ulcer ([place]) 01/12/2019  . Chronic venous insufficiency 12/01/2018  . Lower extremity ulceration, unspecified laterality, with fat layer exposed ([place]) 12/01/2018  . Diabetes mellitus without complication ([place]) [id number]  . Airway hyperreactivity 12/13/2014  . Acid reflux 12/13/2014  . Hypercholesteremia 12/13/2014  . Decreased potassium in the blood 12/13/2014  . Cramps of lower extremity 12/13/2014  . Extreme obesity 12/13/2014  . Avitaminosis D 12/13/2014  . Dermatophytic onychia 12/24/2013  . Bony exostosis 12/24/2013  . Unstable angina pectoris ([place]) 12/24/2013  . Polyneuropathy 12/24/2013  . Family history of breast cancer 03/01/2008  . Obstructive apnea 12/26/2007  . Diaphragmatic hernia 10/08/2006  . Allergic rhinitis, seasonal 10/08/2006  . Diabetes mellitus, type 2 ([place]) 10/08/2006  . Essential (primary) hypertension 12/29/1999    Beckey Rutter, DNP, AGNP-C [facility]  [email or web address] [facility] Main Line: [phone]

## 2019-07-21 ENCOUNTER — Telehealth: Payer: Self-pay

## 2019-07-21 NOTE — Telephone Encounter (Signed)
Copied from [place] [id number]. Topic: Appointment Scheduling - Scheduling Inquiry for Clinic >> Jul 21, 2019  3:07 PM Erick Blinks wrote: Reason for CRM: Pt called requesting to be contacted for covid antibody injections, please advise  Best contact: [phone]

## 2019-07-21 NOTE — Telephone Encounter (Signed)
LMOVM for pt to return call. Patient can call [phone] to schedule an appointment to receive covid-19 vaccine. Okay for [facility] to give patient message above.

## 2019-07-22 ENCOUNTER — Other Ambulatory Visit: Payer: Self-pay | Admitting: Nurse Practitioner

## 2019-07-22 DIAGNOSIS — U071 COVID-19: Secondary | ICD-10-CM

## 2019-07-22 DIAGNOSIS — I10 Essential (primary) hypertension: Secondary | ICD-10-CM

## 2019-07-22 NOTE — Progress Notes (Signed)
  I connected by phone with Karen Stephenson on 07/22/2019 at 10:30 AM to discuss the potential use of an new treatment for mild to moderate COVID-19 viral infection in non-hospitalized patients.  This patient is a 64 y.o. female that meets the FDA criteria for Emergency Use Authorization of bamlanivimab or casirivimab\imdevimab.  Has a (+) direct SARS-CoV-2 viral test result  Has mild or moderate COVID-19   Is ? 67 years of age and weighs ? 40 kg  Is NOT hospitalized due to COVID-19  Is NOT requiring oxygen therapy or requiring an increase in baseline oxygen flow rate due to COVID-19  Is within 10 days of symptom onset  Has at least one of the high risk factor(s) for progression to severe COVID-19 and/or hospitalization as defined in EUA.  Specific high risk criteria : Hypertension   I have spoken and communicated the following to the patient or parent/caregiver:  1. FDA has authorized the emergency use of bamlanivimab and casirivimab\imdevimab for the treatment of mild to moderate COVID-19 in adults and pediatric patients with positive results of direct SARS-CoV-2 viral testing who are 52 years of age and older weighing at least 40 kg, and who are at high risk for progressing to severe COVID-19 and/or hospitalization.  2. The significant known and potential risks and benefits of bamlanivimab and casirivimab\imdevimab, and the extent to which such potential risks and benefits are unknown.  3. Information on available alternative treatments and the risks and benefits of those alternatives, including clinical trials.  4. Patients treated with bamlanivimab and casirivimab\imdevimab should continue to self-isolate and use infection control measures (e.g., wear mask, isolate, social distance, avoid sharing personal items, clean and disinfect "high touch" surfaces, and frequent handwashing) according to CDC guidelines.   5. The patient or parent/caregiver has the option to accept or refuse  bamlanivimab or casirivimab\imdevimab .  After reviewing this information with the patient, The patient agreed to proceed with receiving the bamlanimivab infusion and will be provided a copy of the Fact sheet prior to receiving the infusion.Fenton Foy 07/22/2019 10:30 AM

## 2019-07-24 ENCOUNTER — Ambulatory Visit (HOSPITAL_COMMUNITY)
Admission: RE | Admit: 2019-07-24 | Discharge: 2019-07-24 | Disposition: A | Discharge status: Home / Self Care | Payer: Medicare Other | Source: Ambulatory Visit | Attending: Pulmonary Disease | Admitting: Pulmonary Disease

## 2019-07-24 DIAGNOSIS — Z23 Encounter for immunization: Secondary | ICD-10-CM | POA: Diagnosis not present

## 2019-07-24 DIAGNOSIS — I10 Essential (primary) hypertension: Secondary | ICD-10-CM

## 2019-07-24 DIAGNOSIS — U071 COVID-19: Secondary | ICD-10-CM

## 2019-07-24 MED ORDER — SODIUM CHLORIDE 0.9 % IV SOLN
700.0000 mg | Freq: Once | INTRAVENOUS | Status: AC
  Administered 2019-07-24: 700 mg via INTRAVENOUS
  Filled 2019-07-24: qty 20

## 2019-07-24 MED ORDER — METHYLPREDNISOLONE SODIUM SUCC 125 MG IJ SOLR: 125.0000 mg | Freq: Once | INTRAMUSCULAR | Status: DC | PRN

## 2019-07-24 MED ORDER — ALBUTEROL SULFATE HFA 108 (90 BASE) MCG/ACT IN AERS: 2.0000 | INHALATION_SPRAY | Freq: Once | RESPIRATORY_TRACT | Status: DC | PRN

## 2019-07-24 MED ORDER — DIPHENHYDRAMINE HCL 50 MG/ML IJ SOLN: 50.0000 mg | Freq: Once | INTRAMUSCULAR | Status: DC | PRN

## 2019-07-24 MED ORDER — EPINEPHRINE 0.3 MG/0.3ML IJ SOAJ: 0.3000 mg | Freq: Once | INTRAMUSCULAR | Status: DC | PRN

## 2019-07-24 MED ORDER — SODIUM CHLORIDE 0.9 % IV SOLN
INTRAVENOUS | Status: DC | PRN
  Administered 2019-07-24: 250 mL via INTRAVENOUS

## 2019-07-24 MED ORDER — FAMOTIDINE IN NACL 20-0.9 MG/50ML-% IV SOLN: 20.0000 mg | Freq: Once | INTRAVENOUS | Status: DC | PRN

## 2019-07-24 NOTE — Discharge Instructions (Signed)

## 2019-07-24 NOTE — Progress Notes (Signed)
  Diagnosis: COVID-19  Physician: Dr. Joya Gaskins  Procedure: Covid Infusion Clinic Med: bamlanivimab infusion - Provided patient with bamlanimivab fact sheet for patients, parents and caregivers prior to infusion.  Complications: No immediate complications noted.  Discharge: Discharged home   Karen Stephenson 07/24/2019

## 2019-07-29 ENCOUNTER — Ambulatory Visit: Payer: Self-pay | Admitting: Family Medicine

## 2019-07-29 ENCOUNTER — Encounter: Payer: Self-pay | Admitting: Family Medicine

## 2019-08-02 ENCOUNTER — Other Ambulatory Visit: Payer: Self-pay | Admitting: Family Medicine

## 2019-08-02 DIAGNOSIS — K219 Gastro-esophageal reflux disease without esophagitis: Secondary | ICD-10-CM

## 2019-08-03 ENCOUNTER — Other Ambulatory Visit: Payer: Self-pay | Admitting: Family Medicine

## 2019-08-03 DIAGNOSIS — J302 Other seasonal allergic rhinitis: Secondary | ICD-10-CM

## 2019-08-03 NOTE — Telephone Encounter (Signed)
Requested Prescriptions  Pending Prescriptions Disp Refills  . fluticasone (FLONASE) 50 MCG/ACT nasal spray [Pharmacy Med Name: Fluticasone Propionate 50 MCG/ACT Nasal Suspension] 16 g 0    Sig: Use 2 spray(s) in each nostril once daily     Ear, Nose, and Throat: Nasal Preparations - Corticosteroids Passed - 08/03/2019 11:59 AM      Passed - Valid encounter within last 12 months    Recent Outpatient Visits          4 months ago Annual physical exam   [facility] Jerrol Banana., MD   7 months ago Type 2 diabetes mellitus without complication, without long-term current use of insulin [facility])   [facility] Jerrol Banana., MD   12 months ago Diabetes mellitus without complication [facility])   [facility] Jerrol Banana., MD   1 year ago Need for influenza vaccination   [facility] Jerrol Banana., MD   1 year ago Diabetes mellitus without complication [facility])   [facility] Jerrol Banana., MD      Future Appointments            In 1 week Jerrol Banana., MD [facility]

## 2019-08-05 ENCOUNTER — Ambulatory Visit: Payer: Medicare Other | Admitting: Podiatry

## 2019-08-05 LAB — HM DIABETES EYE EXAM

## 2019-08-07 ENCOUNTER — Ambulatory Visit: Payer: Medicare Other | Attending: Internal Medicine

## 2019-08-07 ENCOUNTER — Other Ambulatory Visit: Payer: Self-pay | Admitting: Family Medicine

## 2019-08-07 DIAGNOSIS — J302 Other seasonal allergic rhinitis: Secondary | ICD-10-CM

## 2019-08-07 DIAGNOSIS — Z20822 Contact with and (suspected) exposure to covid-19: Secondary | ICD-10-CM

## 2019-08-08 LAB — NOVEL CORONAVIRUS, NAA: SARS-CoV-2, NAA: NOT DETECTED

## 2019-08-08 LAB — SPECIMEN STATUS REPORT

## 2019-08-12 ENCOUNTER — Ambulatory Visit (INDEPENDENT_AMBULATORY_CARE_PROVIDER_SITE_OTHER): Payer: Medicare PPO | Admitting: Family Medicine

## 2019-08-12 ENCOUNTER — Encounter: Payer: Self-pay | Admitting: Family Medicine

## 2019-08-12 DIAGNOSIS — E668 Other obesity: Secondary | ICD-10-CM

## 2019-08-12 DIAGNOSIS — U071 COVID-19: Secondary | ICD-10-CM

## 2019-08-12 DIAGNOSIS — E1142 Type 2 diabetes mellitus with diabetic polyneuropathy: Secondary | ICD-10-CM

## 2019-08-12 NOTE — Progress Notes (Signed)
Patient: Karen Stephenson Female    DOB: 05-15-1956   64 y.o.   MRN: [id number] Visit Date: 08/12/2019  Today's Provider: [name], MD   No chief complaint on file.  Subjective:     Virtual Visit via Telephone Note  I connected with Karen Stephenson on 08/12/19 at  1:40 PM EST by telephone and verified that I am speaking with the correct person using two identifiers.  Location: Patient: home Provider: home   I discussed the limitations, risks, security and privacy concerns of performing an evaluation and management service by telephone and the availability of in person appointments. I also discussed with the patient that there may be a patient responsible charge related to this service. The patient expressed understanding and agreed to proceed.   HPI  Patient had Covid a little more than 3 weeks ago and other than fatigue is recovered.  She did lose 20 pounds and has gained about 5 back.  She feels well.  She has not been checking her blood sugars. Her husband had Covid and had to be hospitalized.  He was seen for follow-up today and was put on an antibiotic for follow-up  pneumonia.  She seems to be in good spirits and her husband is improving. Type 2 diabetes mellitus without complication, without long-term current use of insulin ([place]) From 04/01/2019-Hemoglobin A1c.  Allergies  Allergen Reactions  . Amoxicillin-Pot Clavulanate     no further information given.  . Aspirin   . Ducodyl [Bisacodyl]   . Erythromycin     Other reaction(s): Stomach Ache Stomach pain.  . Glipizide   . Metformin Hcl   . Macrobid [Nitrofurantoin]     Severe diarrhea, severe abdominal pain/cramping     Current Outpatient Medications:  .  acarbose (PRECOSE) 25 MG tablet, Take 1 tablet (25 mg total) by mouth 3 (three) times daily with meals., Disp: 90 tablet, Rfl: 3 .  acarbose (PRECOSE) 25 MG tablet, TAKE 1 TABLET BY MOUTH THREE TIMES DAILY WITH MEALS, Disp: 270 tablet, Rfl: 3 .   albuterol (VENTOLIN HFA) 108 (90 Base) MCG/ACT inhaler, Inhale 2 puffs into the lungs every 6 (six) hours as needed for wheezing or shortness of breath., Disp: 8 g, Rfl: 0 .  benzonatate (TESSALON PERLES) 100 MG capsule, Take 1 capsule (100 mg total) by mouth 3 (three) times daily as needed., Disp: 20 capsule, Rfl: 0 .  BIOTIN PO, Take 1 tablet by mouth daily., Disp: , Rfl:  .  cadexomer iodine (IODOSORB) 0.9 % gel, Apply 1 application topically daily., Disp: 40 g, Rfl: 3 .  Cetirizine HCl (ZYRTEC ALLERGY) 10 MG CAPS, Take 1 capsule by mouth daily. , Disp: , Rfl:  .  dexamethasone (DECADRON) 6 MG tablet, Take 1 tablet (6 mg total) by mouth daily., Disp: 5 tablet, Rfl: 0 .  fluticasone (FLONASE) 50 MCG/ACT nasal spray, Use 2 spray(s) in each nostril once daily, Disp: 16 g, Rfl: 0 .  Fluticasone-Salmeterol (ADVAIR) 250-50 MCG/DOSE AEPB, INHALE ONE DOSE BY MOUTH AS NEEDED, Disp: 60 each, Rfl: 12 .  glimepiride (AMARYL) 2 MG tablet, Take 1 tablet (2 mg total) by mouth daily with breakfast., Disp: 90 tablet, Rfl: 1 .  glucose blood test strip, Check sugar once daily, E 11.9, Disp: 25 each, Rfl: 12 .  guaiFENesin-codeine 100-10 MG/5ML syrup, Take 5 mLs by mouth 3 (three) times daily as needed for cough., Disp: 120 mL, Rfl: 0 .  Homeopathic Products (LEG CRAMP  RELIEF PO), Take 2 capsules by mouth daily. , Disp: , Rfl:  .  hydrochlorothiazide (HYDRODIURIL) 25 MG tablet, Take 1 tablet by mouth once daily, Disp: 90 tablet, Rfl: 0 .  hyoscyamine (LEVBID) 0.375 MG 12 hr tablet, Take 1 tablet (0.375 mg total) by mouth as needed., Disp: 60 tablet, Rfl: 5 .  JANUVIA 100 MG tablet, Take 1 tablet by mouth once daily, Disp: 90 tablet, Rfl: 3 .  JARDIANCE 10 MG TABS tablet, Take 1 tablet by mouth once daily, Disp: 90 tablet, Rfl: 3 .  Lancets (ONETOUCH ULTRASOFT) lancets, Check sugar once daily ,DX E11.9, Disp: 30 each, Rfl: 12 .  losartan (COZAAR) 100 MG tablet, Take 1 tablet by mouth once daily, Disp: 90 tablet,  Rfl: 0 .  montelukast (SINGULAIR) 10 MG tablet, TAKE 1 TABLET BY MOUTH AT BEDTIME, Disp: 90 tablet, Rfl: 1 .  mupirocin ointment (BACTROBAN) 2 %, Apply to wound after soaking BID, Disp: 30 g, Rfl: 1 .  pantoprazole (PROTONIX) 40 MG tablet, Take 1 tablet by mouth once daily, Disp: 90 tablet, Rfl: 0 .  promethazine-dextromethorphan (PROMETHAZINE-DM) 6.25-15 MG/5ML syrup, Take 5 mLs by mouth 3 (three) times daily as needed for cough., Disp: 118 mL, Rfl: 0 .  rosuvastatin (CRESTOR) 20 MG tablet, Take 1 tablet by mouth once daily, Disp: 90 tablet, Rfl: 3 .  vitamin B-12 (CYANOCOBALAMIN) 1000 MCG tablet, Take 1,000 mcg by mouth daily., Disp: , Rfl:  .  Vitamin D, Cholecalciferol, 50 MCG (2000 UT) CAPS, Take by mouth daily., Disp: , Rfl:  .  Vitamin D, Ergocalciferol, (DRISDOL) 1.25 MG (50000 UT) CAPS capsule, TAKE 1 CAPSULE BY MOUTH ONCE A WEEK (Patient not taking: Reported on 07/08/2019), Disp: 4 capsule, Rfl: 12  Review of Systems  Social History   Tobacco Use  . Smoking status: Former Smoker    Types: Cigarettes    Quit date: 07/01/2013    Years since quitting: 6.1  . Smokeless tobacco: Never Used  Substance Use Topics  . Alcohol use: No      Objective:   There were no vitals taken for this visit. There were no vitals filed for this visit.There is no height or weight on file to calculate BMI.   Physical Exam   No results found for any visits on 08/12/19.     Assessment & Plan     1. COVID-19 virus infection Clinically resolved for this patient.  She is free to go back to normal activities.  Recommend Covid vaccine  2. Type 2 diabetes mellitus with diabetic polyneuropathy, without long-term current use of insulin (HCC) I will see her back in 4 to 6 weeks for follow-up and will obtain hemoglobin A1c.  She is up-to-date on other lab work.  3. Extreme obesity She is down 15 pounds due to the Covid infection.  She feels well now and I just encouraged her to try to keep his weight  off.  She states that she is walking easier because of the lower weight.  I discussed the assessment and treatment plan with the patient. The patient was provided an opportunity to ask questions and all were answered. The patient agreed with the plan and demonstrated an understanding of the instructions.   The patient was advised to call back or seek an in-person evaluation if the symptoms worsen or if the condition fails to improve as anticipated.  I provided 10 minutes of non-face-to-face time during this encounter.    Richard Cranford Mon, MD  [facility]  [facility]  

## 2019-08-19 ENCOUNTER — Other Ambulatory Visit: Payer: Self-pay | Admitting: Family Medicine

## 2019-09-01 ENCOUNTER — Encounter: Payer: Self-pay | Admitting: *Deleted

## 2019-09-09 ENCOUNTER — Other Ambulatory Visit: Payer: Self-pay

## 2019-09-09 ENCOUNTER — Encounter: Payer: Self-pay | Admitting: Podiatry

## 2019-09-09 ENCOUNTER — Ambulatory Visit: Payer: Medicare PPO | Admitting: Podiatry

## 2019-09-09 VITALS — Temp 98.0°F

## 2019-09-09 DIAGNOSIS — B351 Tinea unguium: Secondary | ICD-10-CM

## 2019-09-09 DIAGNOSIS — E1142 Type 2 diabetes mellitus with diabetic polyneuropathy: Secondary | ICD-10-CM

## 2019-09-09 DIAGNOSIS — L97521 Non-pressure chronic ulcer of other part of left foot limited to breakdown of skin: Secondary | ICD-10-CM

## 2019-09-09 DIAGNOSIS — M79676 Pain in unspecified toe(s): Secondary | ICD-10-CM

## 2019-09-09 NOTE — Progress Notes (Signed)
She presents today for follow-up of her diabetes and diabetic foot.  States that these toenails are getting long and that her calluses are getting thicker.  Objective: Vital signs are stable alert and oriented x3 there is no erythema edema cellulitis drainage odor pulses remain palpable.  Reactive hyperkeratotic lesions medial aspect of the hallux interphalangeal joint and fifth metatarsophalangeal joints bilaterally once debrided demonstrates superficial ulceration hallux left preulcerative lesions other locations.  Currently no signs of infection toenails are long thick yellow dystrophic.  Assessment: Diabetes diabetic peripheral neuropathy some angiopathy reactive hyperkeratotic lesions and preulcerative lesions as well as ulceration hallux left.  Painful elongated toenails.  Plan: Debridement of toenails 1 through 5 bilateral debrided debridement of preulcerative and ulcerative lesions discussed dressing and appropriate shoe gear we will follow-up with her in about 6 weeks.

## 2019-09-13 ENCOUNTER — Other Ambulatory Visit: Payer: Self-pay | Admitting: Family Medicine

## 2019-09-13 DIAGNOSIS — J302 Other seasonal allergic rhinitis: Secondary | ICD-10-CM

## 2019-09-14 NOTE — Telephone Encounter (Signed)
Requested Prescriptions  Pending Prescriptions Disp Refills  . fluticasone (FLONASE) 50 MCG/ACT nasal spray [Pharmacy Med Name: Fluticasone Propionate 50 MCG/ACT Nasal Suspension] 16 g 0    Sig: Use 2 spray(s) in each nostril once daily     Ear, Nose, and Throat: Nasal Preparations - Corticosteroids Passed - 09/13/2019  7:22 PM      Passed - Valid encounter within last 12 months    Recent Outpatient Visits          1 month ago COVID-19 virus infection   [facility] Jerrol Banana., MD   5 months ago Annual physical exam   [facility] Jerrol Banana., MD   9 months ago Type 2 diabetes mellitus without complication, without long-term current use of insulin [facility])   [facility] Jerrol Banana., MD   1 year ago Diabetes mellitus without complication [facility])   [facility] Jerrol Banana., MD   1 year ago Need for influenza vaccination   [facility] Jerrol Banana., MD      Future Appointments            In 1 week Jerrol Banana., MD [facility] [place]

## 2019-09-22 NOTE — Progress Notes (Signed)
Patient: Karen Stephenson Female    DOB: 12-Jan-1956   64 y.o.   MRN: [id number] Visit Date: 09/23/2019  Today's Provider: [name], MD   Chief Complaint  Patient presents with  . Diabetes Mellitus   Subjective:     HPI  Patient feels well.  She had Covid in late January and had infusion therapy but is recovered nicely.  It has been almost 3 months advised her to go and called to get her vaccines scheduled for next month. Type 2 diabetes mellitus with diabetic polyneuropathy, without long-term current use of insulin ([place]) From 08/12/2019-I will see her back in 4 to 6 weeks for follow-up and will obtain hemoglobin A1c.  She is up-to-date on other lab work.  Allergies  Allergen Reactions  . Amoxicillin-Pot Clavulanate     no further information given.  . Aspirin   . Ducodyl [Bisacodyl]   . Erythromycin     Other reaction(s): Stomach Ache Stomach pain.  . Glipizide   . Metformin Hcl   . Macrobid [Nitrofurantoin]     Severe diarrhea, severe abdominal pain/cramping     Current Outpatient Medications:  .  acarbose (PRECOSE) 25 MG tablet, Take 1 tablet (25 mg total) by mouth 3 (three) times daily with meals., Disp: 90 tablet, Rfl: 3 .  acarbose (PRECOSE) 25 MG tablet, TAKE 1 TABLET BY MOUTH THREE TIMES DAILY WITH MEALS, Disp: 270 tablet, Rfl: 3 .  albuterol (VENTOLIN HFA) 108 (90 Base) MCG/ACT inhaler, Inhale 2 puffs into the lungs every 6 (six) hours as needed for wheezing or shortness of breath., Disp: 8 g, Rfl: 0 .  benzonatate (TESSALON PERLES) 100 MG capsule, Take 1 capsule (100 mg total) by mouth 3 (three) times daily as needed. (Patient not taking: Reported on 08/12/2019), Disp: 20 capsule, Rfl: 0 .  BIOTIN PO, Take 1 tablet by mouth daily., Disp: , Rfl:  .  cadexomer iodine (IODOSORB) 0.9 % gel, Apply 1 application topically daily. (Patient not taking: Reported on 08/12/2019), Disp: 40 g, Rfl: 3 .  Cetirizine HCl (ZYRTEC ALLERGY) 10 MG CAPS, Take 1 capsule by  mouth daily. , Disp: , Rfl:  .  dexamethasone (DECADRON) 6 MG tablet, Take 1 tablet (6 mg total) by mouth daily. (Patient not taking: Reported on 08/12/2019), Disp: 5 tablet, Rfl: 0 .  fluticasone (FLONASE) 50 MCG/ACT nasal spray, Use 2 spray(s) in each nostril once daily, Disp: 16 g, Rfl: 0 .  Fluticasone-Salmeterol (ADVAIR) 250-50 MCG/DOSE AEPB, INHALE ONE DOSE BY MOUTH AS NEEDED, Disp: 60 each, Rfl: 12 .  glimepiride (AMARYL) 2 MG tablet, Take 1 tablet (2 mg total) by mouth daily with breakfast. (Patient not taking: Reported on 08/12/2019), Disp: 90 tablet, Rfl: 1 .  glucose blood test strip, Check sugar once daily, E 11.9, Disp: 25 each, Rfl: 12 .  guaiFENesin-codeine 100-10 MG/5ML syrup, Take 5 mLs by mouth 3 (three) times daily as needed for cough. (Patient not taking: Reported on 08/12/2019), Disp: 120 mL, Rfl: 0 .  Homeopathic Products (LEG CRAMP RELIEF PO), Take 2 capsules by mouth daily. , Disp: , Rfl:  .  hydrochlorothiazide (HYDRODIURIL) 25 MG tablet, Take 1 tablet by mouth once daily, Disp: 90 tablet, Rfl: 0 .  hyoscyamine (LEVBID) 0.375 MG 12 hr tablet, Take 1 tablet (0.375 mg total) by mouth as needed., Disp: 60 tablet, Rfl: 5 .  JANUVIA 100 MG tablet, Take 1 tablet by mouth once daily, Disp: 90 tablet, Rfl: 3 .  JARDIANCE 10 MG TABS tablet, Take 1 tablet by mouth once daily, Disp: 90 tablet, Rfl: 3 .  Lancets (ONETOUCH ULTRASOFT) lancets, Check sugar once daily ,DX E11.9, Disp: 30 each, Rfl: 12 .  losartan (COZAAR) 100 MG tablet, Take 1 tablet by mouth once daily, Disp: 90 tablet, Rfl: 0 .  montelukast (SINGULAIR) 10 MG tablet, TAKE 1 TABLET BY MOUTH AT BEDTIME, Disp: 90 tablet, Rfl: 1 .  Multiple Vitamins-Minerals (ZINC PO), Take by mouth., Disp: , Rfl:  .  mupirocin ointment (BACTROBAN) 2 %, Apply to wound after soaking BID (Patient not taking: Reported on 08/12/2019), Disp: 30 g, Rfl: 1 .  pantoprazole (PROTONIX) 40 MG tablet, Take 1 tablet by mouth once daily, Disp: 90 tablet, Rfl:  0 .  promethazine-dextromethorphan (PROMETHAZINE-DM) 6.25-15 MG/5ML syrup, Take 5 mLs by mouth 3 (three) times daily as needed for cough. (Patient not taking: Reported on 08/12/2019), Disp: 118 mL, Rfl: 0 .  rosuvastatin (CRESTOR) 20 MG tablet, Take 1 tablet by mouth once daily, Disp: 90 tablet, Rfl: 3 .  vitamin B-12 (CYANOCOBALAMIN) 1000 MCG tablet, Take 1,000 mcg by mouth daily., Disp: , Rfl:  .  Vitamin D, Cholecalciferol, 50 MCG (2000 UT) CAPS, Take by mouth daily., Disp: , Rfl:  .  Vitamin D, Ergocalciferol, (DRISDOL) 1.25 MG (50000 UT) CAPS capsule, TAKE 1 CAPSULE BY MOUTH ONCE A WEEK (Patient not taking: Reported on 07/08/2019), Disp: 4 capsule, Rfl: 12  Review of Systems  Constitutional: Negative for appetite change, chills, fatigue and fever.  HENT: Negative.   Eyes: Negative.   Respiratory: Negative for chest tightness and shortness of breath.   Cardiovascular: Negative for chest pain and palpitations.  Gastrointestinal: Negative for abdominal pain, nausea and vomiting.  Endocrine: Negative.   Musculoskeletal: Negative.   Allergic/Immunologic: Negative.   Neurological: Negative for dizziness and weakness.  Hematological: Negative.   Psychiatric/Behavioral: Negative.     Social History   Tobacco Use  . Smoking status: Former Smoker    Types: Cigarettes    Quit date: 07/01/2013    Years since quitting: 6.2  . Smokeless tobacco: Never Used  Substance Use Topics  . Alcohol use: No      Objective:   There were no vitals taken for this visit. There were no vitals filed for this visit.There is no height or weight on file to calculate BMI.   Physical Exam Vitals and nursing note reviewed.  Constitutional:      Appearance: She is well-developed. She is obese.  HENT:     Head: Normocephalic and atraumatic.     Right Ear: External ear normal.     Left Ear: External ear normal.  Eyes:     General: No scleral icterus.    Conjunctiva/sclera: Conjunctivae normal.  Neck:      Thyroid: No thyromegaly.     Vascular: No carotid bruit.  Cardiovascular:     Rate and Rhythm: Normal rate and regular rhythm.     Pulses: Normal pulses.     Heart sounds: Normal heart sounds.  Pulmonary:     Effort: Pulmonary effort is normal.     Breath sounds: Normal breath sounds.  Chest:     Breasts:        Right: No swelling, bleeding, inverted nipple, mass, nipple discharge, skin change or tenderness.        Left: No swelling, bleeding, inverted nipple, mass, nipple discharge, skin change or tenderness.  Abdominal:     Palpations: Abdomen is soft.  Musculoskeletal:  Right lower leg: No edema.     Left lower leg: No edema.  Lymphadenopathy:     Cervical: No cervical adenopathy.  Skin:    General: Skin is warm and dry.  Neurological:     General: No focal deficit present.     Mental Status: She is alert and oriented to person, place, and time.  Psychiatric:        Mood and Affect: Mood normal.        Behavior: Behavior normal.        Thought Content: Thought content normal.        Judgment: Judgment normal.      No results found for any visits on 09/23/19.     Assessment & Plan    1. Type 2 diabetes mellitus with diabetic polyneuropathy, without long-term current use of insulin (HCC) A1c today is 7.7 which is improved from the last of 8.3.  Courage her to continue to work on diet and exercise.  No changes in meds today.  She is on glimepiride. - POCT HgB A1C - POCT UA - Microalbumin  2. Essential (primary) hypertension Controlled on losartan  3. Extreme obesity She has lost 5 pounds since her last visit and encouraged her to continue to work on her weight loss.     Cherrise Occhipinti Cranford Mon, MD  [facility]

## 2019-09-23 ENCOUNTER — Encounter: Payer: Self-pay | Admitting: Family Medicine

## 2019-09-23 ENCOUNTER — Other Ambulatory Visit: Payer: Self-pay

## 2019-09-23 ENCOUNTER — Ambulatory Visit: Payer: Medicare PPO | Admitting: Family Medicine

## 2019-09-23 VITALS — BP 111/74 | HR 77 | Temp 96.6°F | Ht 68.0 in | Wt 262.8 lb

## 2019-09-23 DIAGNOSIS — E1142 Type 2 diabetes mellitus with diabetic polyneuropathy: Secondary | ICD-10-CM | POA: Diagnosis not present

## 2019-09-23 DIAGNOSIS — E668 Other obesity: Secondary | ICD-10-CM | POA: Diagnosis not present

## 2019-09-23 DIAGNOSIS — I10 Essential (primary) hypertension: Secondary | ICD-10-CM | POA: Diagnosis not present

## 2019-09-23 LAB — POCT GLYCOSYLATED HEMOGLOBIN (HGB A1C)
Estimated Average Glucose: 174
Hemoglobin A1C: 7.7 % — AB (ref 4.0–5.6)

## 2019-09-23 LAB — POCT UA - MICROALBUMIN: Microalbumin Ur, POC: 20 mg/L

## 2019-10-15 ENCOUNTER — Encounter: Payer: Self-pay | Admitting: Family Medicine

## 2019-10-15 ENCOUNTER — Other Ambulatory Visit: Payer: Self-pay | Admitting: Family Medicine

## 2019-10-21 ENCOUNTER — Ambulatory Visit: Payer: Medicare PPO | Admitting: Podiatry

## 2019-10-21 ENCOUNTER — Other Ambulatory Visit: Payer: Self-pay

## 2019-10-21 ENCOUNTER — Encounter: Payer: Self-pay | Admitting: Podiatry

## 2019-10-21 DIAGNOSIS — M79676 Pain in unspecified toe(s): Secondary | ICD-10-CM | POA: Diagnosis not present

## 2019-10-21 DIAGNOSIS — E1142 Type 2 diabetes mellitus with diabetic polyneuropathy: Secondary | ICD-10-CM

## 2019-10-21 DIAGNOSIS — B351 Tinea unguium: Secondary | ICD-10-CM | POA: Diagnosis not present

## 2019-10-21 DIAGNOSIS — L97521 Non-pressure chronic ulcer of other part of left foot limited to breakdown of skin: Secondary | ICD-10-CM

## 2019-10-21 NOTE — Progress Notes (Signed)
She presents today for follow-up of ulcers to the hallux bilateral subfirst and subfifth bilaterally she also has painfully elongated toenails.  She says she thinks that the ulcers are doing pretty well.  Is not too bad.  Objective: Vital signs are stable she is alert and oriented x3.  Pulses are palpable.  Reactive hyperkeratotic lesions once debrided today do demonstrate preulcerative lesions particularly the hallux left.  The remainder look pretty good.  I see no signs of infection.  Toenails are thick yellow dystrophic-like mycotic.  Pulses remain palpable and strong.  No other open lesions are noted.  Assessment: Buttock ulceration hallux left with preulcerative lesions remainder subfirst subfifth and medial hallux right.  Painful elongated toenails.  Plan: Debridement of toenails 1 through 5 bilateral debridement of ulceration.  Debridement of all reactive hyperkeratotic tissue follow-up with her

## 2019-10-30 ENCOUNTER — Other Ambulatory Visit: Payer: Self-pay | Admitting: Family Medicine

## 2019-10-30 DIAGNOSIS — J302 Other seasonal allergic rhinitis: Secondary | ICD-10-CM

## 2019-10-30 NOTE — Telephone Encounter (Signed)
Requested Prescriptions  Pending Prescriptions Disp Refills  . fluticasone (FLONASE) 50 MCG/ACT nasal spray [Pharmacy Med Name: Fluticasone Propionate 50 MCG/ACT Nasal Suspension] 16 g 0    Sig: Use 2 spray(s) in each nostril once daily     Ear, Nose, and Throat: Nasal Preparations - Corticosteroids Passed - 10/30/2019  4:44 PM      Passed - Valid encounter within last 12 months    Recent Outpatient Visits          1 month ago Type 2 diabetes mellitus with diabetic polyneuropathy, without long-term current use of insulin [facility])   [facility] Jerrol Banana., MD   2 months ago COVID-19 virus infection   [facility] Rosanna Randy, Retia Passe., MD   7 months ago Annual physical exam   [facility] Jerrol Banana., MD   10 months ago Type 2 diabetes mellitus without complication, without long-term current use of insulin [facility])   [facility] Jerrol Banana., MD   1 year ago Diabetes mellitus without complication [facility])   [facility] Jerrol Banana., MD      Future Appointments            In 2 months Jerrol Banana., MD [facility]

## 2019-10-31 ENCOUNTER — Ambulatory Visit: Payer: Self-pay | Attending: Internal Medicine

## 2019-10-31 ENCOUNTER — Other Ambulatory Visit: Payer: Self-pay

## 2019-10-31 DIAGNOSIS — Z23 Encounter for immunization: Secondary | ICD-10-CM

## 2019-10-31 NOTE — Progress Notes (Signed)
   Covid-19 Vaccination Clinic  Name:  Karen Stephenson    MRN: [id number] DOB: 03/25/1956  10/31/2019  [name] was observed post Covid-19 immunization for 15 minutes without incident. She was provided with Vaccine Information Sheet and instruction to access the V-Safe system.   [name] was instructed to call 911 with any severe reactions post vaccine: Marland Kitchen Difficulty breathing  . Swelling of face and throat  . A fast heartbeat  . A bad rash all over body  . Dizziness and weakness   Immunizations Administered    Name Date Dose VIS Date Route   Pfizer COVID-19 Vaccine 10/31/2019  9:55 AM 0.3 mL 08/26/2018 Intramuscular   Manufacturer: [place]   Lot: [id number]   [place]: [id number]

## 2019-11-07 ENCOUNTER — Other Ambulatory Visit: Payer: Self-pay | Admitting: Family Medicine

## 2019-11-07 DIAGNOSIS — K219 Gastro-esophageal reflux disease without esophagitis: Secondary | ICD-10-CM

## 2019-11-07 NOTE — Telephone Encounter (Signed)
Requested Prescriptions  Pending Prescriptions Disp Refills  . pantoprazole (PROTONIX) 40 MG tablet [Pharmacy Med Name: Pantoprazole Sodium 40 MG Oral Tablet Delayed Release] 90 tablet 3    Sig: Take 1 tablet by mouth once daily     Gastroenterology: Proton Pump Inhibitors Passed - 11/07/2019 10:47 PM      Passed - Valid encounter within last 12 months    Recent Outpatient Visits          1 month ago Type 2 diabetes mellitus with diabetic polyneuropathy, without long-term current use of insulin [facility])   [facility] Jerrol Banana., MD   2 months ago COVID-19 virus infection   [facility] Rosanna Randy, Retia Passe., MD   7 months ago Annual physical exam   [facility] Jerrol Banana., MD   11 months ago Type 2 diabetes mellitus without complication, without long-term current use of insulin [facility])   [facility] Jerrol Banana., MD   1 year ago Diabetes mellitus without complication [facility])   [facility] Jerrol Banana., MD      Future Appointments            In 2 months Jerrol Banana., MD [facility]

## 2019-11-14 ENCOUNTER — Other Ambulatory Visit: Payer: Self-pay | Admitting: Family Medicine

## 2019-11-14 DIAGNOSIS — I10 Essential (primary) hypertension: Secondary | ICD-10-CM

## 2019-11-14 NOTE — Telephone Encounter (Signed)
Requested Prescriptions  Pending Prescriptions Disp Refills  . losartan (COZAAR) 100 MG tablet [Pharmacy Med Name: Losartan Potassium 100 MG Oral Tablet] 90 tablet 0    Sig: Take 1 tablet by mouth once daily     Cardiovascular:  Angiotensin Receptor Blockers Failed - 11/14/2019  2:21 AM      Failed - Cr in normal range and within 180 days    Creatinine, Ser  Date Value Ref Range Status  04/01/2019 0.85 0.57 - 1.00 mg/dL Final         Failed - K in normal range and within 180 days    Potassium  Date Value Ref Range Status  04/01/2019 3.6 3.5 - 5.2 mmol/L Final         Passed - Patient is not pregnant      Passed - Last BP in normal range    BP Readings from Last 1 Encounters:  09/23/19 111/74         Passed - Valid encounter within last 6 months    Recent Outpatient Visits          1 month ago Type 2 diabetes mellitus with diabetic polyneuropathy, without long-term current use of insulin ([place])   [facility] Jerrol Banana., MD   3 months ago COVID-19 virus infection   [facility] Rosanna Randy, Retia Passe., MD   7 months ago Annual physical exam   [facility] Jerrol Banana., MD   11 months ago Type 2 diabetes mellitus without complication, without long-term current use of insulin [facility])   [facility] Jerrol Banana., MD   1 year ago Diabetes mellitus without complication [facility])   [facility] Jerrol Banana., MD      Future Appointments            In 2 months Jerrol Banana., MD [facility]           . hydrochlorothiazide (HYDRODIURIL) 25 MG tablet [Pharmacy Med Name: hydroCHLOROthiazide 25 MG Oral Tablet] 90 tablet 1    Sig: Take 1 tablet by mouth once daily     Cardiovascular: Diuretics - Thiazide Passed - 11/14/2019  2:21 AM      Passed - Ca in normal range and within 360 days    Calcium  Date Value Ref Range Status  04/01/2019 9.6 8.7 -  10.3 mg/dL Final         Passed - Cr in normal range and within 360 days    Creatinine, Ser  Date Value Ref Range Status  04/01/2019 0.85 0.57 - 1.00 mg/dL Final         Passed - K in normal range and within 360 days    Potassium  Date Value Ref Range Status  04/01/2019 3.6 3.5 - 5.2 mmol/L Final         Passed - Na in normal range and within 360 days    Sodium  Date Value Ref Range Status  04/01/2019 140 134 - 144 mmol/L Final         Passed - Last BP in normal range    BP Readings from Last 1 Encounters:  09/23/19 111/74         Passed - Valid encounter within last 6 months    Recent Outpatient Visits          1 month ago Type 2 diabetes mellitus with diabetic polyneuropathy, without long-term current use of  insulin [facility])   [facility] Jerrol Banana., MD   3 months ago COVID-19 virus infection   [facility] Jerrol Banana., MD   7 months ago Annual physical exam   [facility] Jerrol Banana., MD   11 months ago Type 2 diabetes mellitus without complication, without long-term current use of insulin [facility])   [facility] Jerrol Banana., MD   1 year ago Diabetes mellitus without complication [facility])   [facility] Jerrol Banana., MD      Future Appointments            In 2 months Jerrol Banana., MD [facility]

## 2019-11-14 NOTE — Telephone Encounter (Signed)
Requested medications are due for refill today?  Yes  Requested medications are on active medication list?  Yes  Last Refill:  08/19/2019  # 90 with no refills  Future visit scheduled?  Yes  Notes to Clinic:  Medication failed RX refill protocol due to no labs within the past 180 days.  Last labs performed on 04/01/2019.

## 2019-11-20 ENCOUNTER — Encounter: Payer: Self-pay | Admitting: Podiatry

## 2019-11-24 ENCOUNTER — Ambulatory Visit: Payer: Medicare PPO | Attending: Internal Medicine

## 2019-11-24 DIAGNOSIS — Z23 Encounter for immunization: Secondary | ICD-10-CM

## 2019-11-24 NOTE — Progress Notes (Signed)
   Covid-19 Vaccination Clinic  Name:  Karen Stephenson    MRN: [id number] DOB: Apr 12, 1956  11/24/2019  [name] was observed post Covid-19 immunization for 15 minutes without incident. She was provided with Vaccine Information Sheet and instruction to access the V-Safe system.   [name] was instructed to call 911 with any severe reactions post vaccine: Marland Kitchen Difficulty breathing  . Swelling of face and throat  . A fast heartbeat  . A bad rash all over body  . Dizziness and weakness   Immunizations Administered    Name Date Dose VIS Date Route   Pfizer COVID-19 Vaccine 11/24/2019 10:22 AM 0.3 mL 08/26/2018 Intramuscular   Manufacturer: [employer]   Lot: [id number]   [place]: [id number]

## 2019-12-09 ENCOUNTER — Other Ambulatory Visit: Payer: Self-pay | Admitting: Family Medicine

## 2019-12-09 ENCOUNTER — Other Ambulatory Visit: Payer: Self-pay

## 2019-12-09 ENCOUNTER — Ambulatory Visit: Payer: Medicare PPO | Admitting: Podiatry

## 2019-12-09 ENCOUNTER — Encounter: Payer: Self-pay | Admitting: Podiatry

## 2019-12-09 DIAGNOSIS — B351 Tinea unguium: Secondary | ICD-10-CM | POA: Diagnosis not present

## 2019-12-09 DIAGNOSIS — J302 Other seasonal allergic rhinitis: Secondary | ICD-10-CM

## 2019-12-09 DIAGNOSIS — E1142 Type 2 diabetes mellitus with diabetic polyneuropathy: Secondary | ICD-10-CM | POA: Diagnosis not present

## 2019-12-09 DIAGNOSIS — M79676 Pain in unspecified toe(s): Secondary | ICD-10-CM | POA: Diagnosis not present

## 2019-12-09 DIAGNOSIS — L97521 Non-pressure chronic ulcer of other part of left foot limited to breakdown of skin: Secondary | ICD-10-CM

## 2019-12-09 NOTE — Progress Notes (Signed)
She presents today for diabetic foot checkup states that she has a new ulcer to the hallux left started about 3 weeks ago she has been treating it with me mupirocin and Iodosorb gel.  She denies fever chills nausea vomiting muscle aches and pains would like to have that treated as well as painful toenails.  Objective: Vital signs are stable she alert oriented x3 pulses remain palpable.  Her feet are warm to the touch no open lesions other than the medial aspect of the hallux left which demonstrates reactive hyperkeratosis once debrided demonstrates very small superficial ulceration with some skin breakdown around it.  At this point it measures about 5 mm in diameter but the macerated halo probably about a centimeter.  Toenails are long thick yellow dystrophic-like mycotic.  This does not probe to bone.  Assessment: Diabetic ulceration medial aspect hallux left pain limb secondary to onychomycosis.  Diabetic peripheral neuropathy diabetic ulceration of toe.  Plan: Debridement of toenails 1 through 5 bilateral.  Debrided reactive hyperkeratotic lesions bilaterally debrided ulceration today.  Dressed the foot and discussed soaking the lesion and apply mupirocin.  Follow-up with her in a couple weeks

## 2019-12-22 ENCOUNTER — Telehealth: Payer: Self-pay

## 2019-12-22 NOTE — Telephone Encounter (Signed)
Copied from [place] [id number] [id number]. Topic: General - Other >> Dec 22, 2019  1:20 PM Keene Breath wrote: Reason for CRM: Patient to inform the doctor that she has been running a fever and has the chills since this morning.  Patient stated she has both vaccines and wanted to know if she could have an antibiotic called in or does she have to be seen.  Please advise and call patient to discuss at [phone].

## 2019-12-22 NOTE — Telephone Encounter (Signed)
Returned call to pt and scheduled mychart visit.

## 2019-12-24 ENCOUNTER — Other Ambulatory Visit: Payer: Self-pay

## 2019-12-24 ENCOUNTER — Telehealth (INDEPENDENT_AMBULATORY_CARE_PROVIDER_SITE_OTHER): Payer: Medicare PPO | Admitting: Family Medicine

## 2019-12-24 ENCOUNTER — Ambulatory Visit
Admission: RE | Admit: 2019-12-24 | Discharge: 2019-12-24 | Disposition: A | Discharge status: Home / Self Care | Payer: Medicare PPO | Attending: Family Medicine | Admitting: Family Medicine

## 2019-12-24 ENCOUNTER — Ambulatory Visit
Admission: RE | Admit: 2019-12-24 | Discharge: 2019-12-24 | Disposition: A | Discharge status: Home / Self Care | Payer: Medicare PPO | Source: Ambulatory Visit | Attending: Family Medicine | Admitting: Family Medicine

## 2019-12-24 DIAGNOSIS — R509 Fever, unspecified: Secondary | ICD-10-CM | POA: Insufficient documentation

## 2019-12-24 DIAGNOSIS — R0602 Shortness of breath: Secondary | ICD-10-CM | POA: Diagnosis not present

## 2019-12-24 DIAGNOSIS — R05 Cough: Secondary | ICD-10-CM | POA: Diagnosis not present

## 2019-12-24 DIAGNOSIS — R059 Cough, unspecified: Secondary | ICD-10-CM

## 2019-12-24 MED ORDER — HYDROCOD POLST-CPM POLST ER 10-8 MG/5ML PO SUER: 5.0000 mL | Freq: Two times a day (BID) | ORAL | 0 refills | Status: DC | PRN

## 2019-12-24 NOTE — Progress Notes (Signed)
MyChart Video Visit    Virtual Visit via Video Note   This visit type was conducted due to national recommendations for restrictions regarding the COVID-19 Pandemic (e.g. social distancing) in an effort to limit this patient's exposure and mitigate transmission in our community. This patient is at least at moderate risk for complications without adequate follow up. This format is felt to be most appropriate for this patient at this time. Physical exam was limited by quality of the video and audio technology used for the visit.    Patient location: home Provider location: [place] involved in the visit: patient, provider   Patient: Karen Stephenson   DOB: June 24, 1956   64 y.o. Female  MRN: [id number] Visit Date: 12/24/2019  Today's healthcare provider: Lavon Paganini, MD   Chief Complaint  Patient presents with  . URI   Subjective    HPI Upper respiratory symptoms She complains of achiness, congestion, cough described as productive of yellow sputum, fever 101, nasal congestion, shortness of breath and sinus pressure.with night sweats, weight loss. Onset of symptoms was 5 days ago and worsening.She is drinking plenty of fluids.  Past history is significant for no history of pneumonia or bronchitis. Patient is non-smoker  "Feels like the flu" She is vaccinated against COVID No known sick contacts Taking tylenol for fever and aching ---------------------------------------------------------------------------------------------------   Patient Active Problem List   Diagnosis Date Noted  . Diabetic foot ulcer ([place]) 01/12/2019  . Chronic venous insufficiency 12/01/2018  . Lower extremity ulceration, unspecified laterality, with fat layer exposed ([place]) 12/01/2018  . Diabetes mellitus without complication ([place]) 79/08/4095  . Airway hyperreactivity 12/13/2014  . Acid reflux 12/13/2014  . Hypercholesteremia 12/13/2014  . Decreased potassium in the blood  12/13/2014  . Cramps of lower extremity 12/13/2014  . Extreme obesity 12/13/2014  . Avitaminosis D 12/13/2014  . Dermatophytic onychia 12/24/2013  . Bony exostosis 12/24/2013  . Unstable angina pectoris ([place]) 12/24/2013  . Polyneuropathy 12/24/2013  . Family history of breast cancer 03/01/2008  . Obstructive apnea 12/26/2007  . Diaphragmatic hernia 10/08/2006  . Allergic rhinitis, seasonal 10/08/2006  . Diabetes mellitus, type 2 ([place]) 10/08/2006  . Essential (primary) hypertension 12/29/1999   Social History   Tobacco Use  . Smoking status: Former Smoker    Types: Cigarettes    Quit date: 07/01/2013    Years since quitting: 6.4  . Smokeless tobacco: Never Used  Substance Use Topics  . Alcohol use: No  . Drug use: No      Medications: Outpatient Medications Prior to Visit  Medication Sig  . acarbose (PRECOSE) 25 MG tablet Take 1 tablet (25 mg total) by mouth 3 (three) times daily with meals.  Marland Kitchen albuterol (VENTOLIN HFA) 108 (90 Base) MCG/ACT inhaler Inhale 2 puffs into the lungs every 6 (six) hours as needed for wheezing or shortness of breath.  Marland Kitchen BIOTIN PO Take 1 tablet by mouth daily.  . Cetirizine HCl (ZYRTEC ALLERGY) 10 MG CAPS Take 1 capsule by mouth daily.   . fluticasone (FLONASE) 50 MCG/ACT nasal spray Use 2 spray(s) in each nostril once daily  . Fluticasone-Salmeterol (ADVAIR) 250-50 MCG/DOSE AEPB INHALE ONE DOSE BY MOUTH AS NEEDED  . glucose blood test strip Check sugar once daily, E 11.9  . Homeopathic Products (LEG CRAMP RELIEF PO) Take 2 capsules by mouth daily.   . hydrochlorothiazide (HYDRODIURIL) 25 MG tablet Take 1 tablet by mouth once daily  . hyoscyamine (LEVBID) 0.375 MG 12 hr tablet Take  1 tablet (0.375 mg total) by mouth as needed.  Marland Kitchen JANUVIA 100 MG tablet Take 1 tablet by mouth once daily  . JARDIANCE 10 MG TABS tablet Take 1 tablet by mouth once daily  . Lancets (ONETOUCH ULTRASOFT) lancets Check sugar once daily ,DX E11.9  . losartan (COZAAR) 100 MG  tablet Take 1 tablet by mouth once daily  . montelukast (SINGULAIR) 10 MG tablet TAKE 1 TABLET BY MOUTH AT BEDTIME  . Multiple Vitamins-Minerals (ZINC PO) Take by mouth.  . pantoprazole (PROTONIX) 40 MG tablet Take 1 tablet by mouth once daily  . rosuvastatin (CRESTOR) 20 MG tablet Take 1 tablet by mouth once daily  . vitamin B-12 (CYANOCOBALAMIN) 1000 MCG tablet Take 1,000 mcg by mouth daily.  . Vitamin D, Cholecalciferol, 50 MCG (2000 UT) CAPS Take by mouth daily.  Marland Kitchen acarbose (PRECOSE) 25 MG tablet TAKE 1 TABLET BY MOUTH THREE TIMES DAILY WITH MEALS  . cadexomer iodine (IODOSORB) 0.9 % gel Apply 1 application topically daily. (Patient not taking: Reported on 08/12/2019)  . mupirocin ointment (BACTROBAN) 2 % Apply to wound after soaking BID (Patient not taking: Reported on 08/12/2019)   No facility-administered medications prior to visit.    Review of Systems  Constitutional: Positive for chills, fatigue and fever. Negative for activity change and appetite change.  HENT: Positive for congestion, sinus pressure and sinus pain. Negative for ear pain, postnasal drip, rhinorrhea and sore throat.   Eyes: Positive for pain.  Respiratory: Positive for cough and shortness of breath. Negative for wheezing.     Last CBC Lab Results  Component Value Date   WBC 7.3 04/01/2019   HGB 13.0 04/01/2019   HCT 40.1 04/01/2019   MCV 84 04/01/2019   MCH 27.3 04/01/2019   RDW 13.6 04/01/2019   PLT 264 04/01/2019      Objective    There were no vitals taken for this visit. BP Readings from Last 3 Encounters:  09/23/19 111/74  07/24/19 112/64  07/08/19 134/78   Wt Readings from Last 3 Encounters:  09/23/19 262 lb 12.8 oz (119.2 kg)  07/08/19 267 lb 12.8 oz (121.5 kg)  04/01/19 266 lb (120.7 kg)      Physical Exam Constitutional:      General: She is not in acute distress.    Appearance: Normal appearance. She is not diaphoretic.  Pulmonary:     Effort: Pulmonary effort is normal. No  respiratory distress.  Neurological:     Mental Status: She is alert and oriented to person, place, and time.        Assessment & Plan     1. Cough 2. Shortness of breath 3. Fever, unspecified fever cause - new problem, unclear etiology -No symptoms of strep pharyngitis, AOM, UTI -Given patient's known diabetes and age, she is high risk for pneumonia, so we will obtain a chest x-ray -We will treat with antibiotics if there is pneumonia on CXR - viral URI, including COVID, are still on the differential as well - advised to get outpatient COVID testing - discussed symptomatic management, natural course, and return precautions  - also discussed possible emergent precautions - DG Chest 2 View; Future   Meds ordered this encounter  Medications  . chlorpheniramine-HYDROcodone (TUSSIONEX PENNKINETIC ER) 10-8 MG/5ML SUER    Sig: Take 5 mLs by mouth every 12 (twelve) hours as needed for cough.    Dispense:  140 mL    Refill:  0    Return if symptoms worsen or fail  to improve.     I discussed the assessment and treatment plan with the patient. The patient was provided an opportunity to ask questions and all were answered. The patient agreed with the plan and demonstrated an understanding of the instructions.   The patient was advised to call back or seek an in-person evaluation if the symptoms worsen or if the condition fails to improve as anticipated.  I, Lavon Paganini, MD, have reviewed all documentation for this visit. The documentation on 12/24/19 for the exam, diagnosis, procedures, and orders are all accurate and complete.   Larayne Baxley, Dionne Bucy, MD, MPH [facility]

## 2019-12-24 NOTE — Patient Instructions (Signed)
Fever, Adult     A fever is an increase in the body's temperature. It is usually defined as a temperature of 100.4F (38C) or higher. Brief mild or moderate fevers generally have no long-term effects, and they often do not need treatment. Moderate or high fevers may make you feel uncomfortable and can sometimes be a sign of a serious illness or disease. The sweating that may occur with repeated or prolonged fever may also cause a loss of fluid in the body (dehydration). Fever is confirmed by taking a temperature with a thermometer. A measured temperature can vary with:  Age.  Time of day.  Where in the body you take the temperature. Readings may vary if you place the thermometer: ? In the mouth (oral). ? In the rectum (rectal). ? In the ear (tympanic). ? Under the arm (axillary). ? On the forehead (temporal). Follow these instructions at home: Medicines  Take over-the counter and prescription medicines only as told by your health care provider. Follow the dosing instructions carefully.  If you were prescribed an antibiotic medicine, take it as told by your health care provider. Do not stop taking the antibiotic even if you start to feel better. General instructions  Watch your condition for any changes. Let your health care provider know about them.  Rest as needed.  Drink enough fluid to keep your urine pale yellow. This helps to prevent dehydration.  Sponge yourself or bathe with room-temperature water to help reduce your body temperature as needed. Do not use ice water.  Do not use too many blankets or wear clothes that are too heavy.  If your fever may be caused by an infection that spreads from person to person (is contagious), such as a cold or the flu, you should stay home from work and public gatherings for at least 24 hours after your fever is gone. Your fever should be gone without the need to use medicines. Contact a health care provider if:  You vomit.  You cannot  eat or drink without vomiting.  You have diarrhea.  You have pain when you urinate.  Your symptoms do not improve with treatment.  You develop new symptoms.  You develop excessive weakness. Get help right away if:  You have shortness of breath or have trouble breathing.  You are dizzy or you faint.  You are disoriented or confused.  You develop signs of dehydration, such as: ? Dark urine, very little urine, or no urine. ? Cracked lips. ? Dry mouth. ? Sunken eyes. ? Sleepiness. ? Weakness.  You develop severe pain in your abdomen.  You have persistent vomiting or diarrhea.  You develop a skin rash.  Your symptoms suddenly get worse. Summary  A fever is an increase in the body's temperature. It is usually defined as a temperature of 100.4F (38C) or higher. Moderate or high fevers can sometimes be a sign of a serious illness or disease. The sweating that may occur with repeated or prolonged fever may also cause dehydration.  Pay attention to any changes in your symptoms and contact your health care provider if your symptoms do not improve with treatment.  Take over-the counter and prescription medicines only as told by your health care provider. Follow the dosing instructions carefully.  If your fever is from an infection that may be contagious, such as cold or flu, you should stay home from work and public gatherings for at least 24 hours after your fever is gone. Your fever should be gone   without the need to use medicines.  Get help right away if you develop signs of dehydration, such as dark urine, cracked lips, dry mouth, sunken eyes, sleepiness, or weakness. This information is not intended to replace advice given to you by your health care provider. Make sure you discuss any questions you have with your health care provider. Document Revised: 12/02/2017 Document Reviewed: 12/02/2017 Elsevier Patient Education  2020 [employer]  

## 2019-12-25 ENCOUNTER — Ambulatory Visit: Payer: Medicare PPO | Attending: Internal Medicine

## 2019-12-25 ENCOUNTER — Telehealth: Payer: Self-pay

## 2019-12-25 DIAGNOSIS — Z20822 Contact with and (suspected) exposure to covid-19: Secondary | ICD-10-CM | POA: Diagnosis not present

## 2019-12-25 NOTE — Telephone Encounter (Signed)
Copied from [place] [id number]. Topic: General - Other >> Dec 25, 2019  4:19 PM Oneta Rack wrote: Reason for CRM:  patient inquiring about her imaging results.   Also patient  wanted PCP to know the following she was unable to start chlorpheniramine-HYDROcodone (TUSSIONEX PENNKINETIC ER) 10-8 MG/5ML SUER  yesterday due to the pharmacy not being able to fill, patient will start cough medication today. Patient also wanted physician to know she was tested for COVID

## 2019-12-26 LAB — NOVEL CORONAVIRUS, NAA: SARS-CoV-2, NAA: NOT DETECTED

## 2019-12-26 LAB — SARS-COV-2, NAA 2 DAY TAT

## 2019-12-28 ENCOUNTER — Other Ambulatory Visit: Payer: Self-pay

## 2019-12-28 ENCOUNTER — Telehealth: Payer: Self-pay

## 2019-12-28 ENCOUNTER — Encounter: Payer: Self-pay | Admitting: Podiatry

## 2019-12-28 ENCOUNTER — Ambulatory Visit: Payer: Medicare PPO | Admitting: Podiatry

## 2019-12-28 DIAGNOSIS — E1142 Type 2 diabetes mellitus with diabetic polyneuropathy: Secondary | ICD-10-CM | POA: Diagnosis not present

## 2019-12-28 DIAGNOSIS — L97521 Non-pressure chronic ulcer of other part of left foot limited to breakdown of skin: Secondary | ICD-10-CM

## 2019-12-28 MED ORDER — DOXYCYCLINE HYCLATE 100 MG PO TABS: 100.0000 mg | ORAL_TABLET | Freq: Two times a day (BID) | ORAL | 0 refills | Status: DC

## 2019-12-28 NOTE — Telephone Encounter (Signed)
-----   Message from Margo Common, [place] sent at 12/28/2019  4:30 PM EDT ----- Chest x-ray negative for pneumonia. Continue present regimen and increased fluid intake. If no better in 3 days, may need prednisone taper.

## 2019-12-28 NOTE — Telephone Encounter (Signed)
DG Chest 2 View Order: [id number] Status:  Final result Visible to patient:  Yes (seen) Dx:  Shortness of breath; Cough; Fever, un.Marland KitchenMarland Kitchen

## 2019-12-29 ENCOUNTER — Encounter: Payer: Self-pay | Admitting: Podiatry

## 2019-12-29 NOTE — Progress Notes (Signed)
Subjective:  Patient ID: Karen Stephenson, female    DOB: [birth year],  MRN: [id number]  Chief Complaint  Patient presents with  . Foot Ulcer    blister that burst 5 days ago, now skin peeling, left great toe    64 y.o. female presents for wound care.  Patient presents with a follow-up of left hallux wound with mild epidermal lysis.  Patient states that is getting a little bit better but this 1 has not been going down at all.  Patient is known to Dr. Milinda Pointer who has been treating the wound with mupirocin cream.  She is here today for a follow-up of evaluation and management of the wound to see if there is any way to decrease the wound size.  Patient states that she has been soaking it in Epson salt.  She may have also wrapped it too tight that may have caused it to blister circumferentially around the toe.  She denies any other acute complaints.   Review of Systems: Negative except as noted in the HPI. Denies N/V/F/Ch.  Past Medical History:  Diagnosis Date  . Anginal pain ([place])   . Asthma   . Diabetes mellitus without complication ([place])   . GERD (gastroesophageal reflux disease)   . Hypercholesteremia   . Hypertension   . Hypokalemia   . Sleep apnea     Current Outpatient Medications:  .  acarbose (PRECOSE) 25 MG tablet, Take 1 tablet (25 mg total) by mouth 3 (three) times daily with meals., Disp: 90 tablet, Rfl: 3 .  acarbose (PRECOSE) 25 MG tablet, TAKE 1 TABLET BY MOUTH THREE TIMES DAILY WITH MEALS, Disp: 270 tablet, Rfl: 3 .  albuterol (VENTOLIN HFA) 108 (90 Base) MCG/ACT inhaler, Inhale 2 puffs into the lungs every 6 (six) hours as needed for wheezing or shortness of breath., Disp: 8 g, Rfl: 0 .  BIOTIN PO, Take 1 tablet by mouth daily., Disp: , Rfl:  .  cadexomer iodine (IODOSORB) 0.9 % gel, Apply 1 application topically daily., Disp: 40 g, Rfl: 3 .  Cetirizine HCl (ZYRTEC ALLERGY) 10 MG CAPS, Take 1 capsule by mouth daily. , Disp: , Rfl:  .  chlorpheniramine-HYDROcodone  (TUSSIONEX PENNKINETIC ER) 10-8 MG/5ML SUER, Take 5 mLs by mouth every 12 (twelve) hours as needed for cough., Disp: 140 mL, Rfl: 0 .  doxycycline (VIBRA-TABS) 100 MG tablet, Take 1 tablet (100 mg total) by mouth 2 (two) times daily., Disp: 28 tablet, Rfl: 0 .  fluticasone (FLONASE) 50 MCG/ACT nasal spray, Use 2 spray(s) in each nostril once daily, Disp: 16 g, Rfl: 3 .  Fluticasone-Salmeterol (ADVAIR) 250-50 MCG/DOSE AEPB, INHALE ONE DOSE BY MOUTH AS NEEDED, Disp: 60 each, Rfl: 12 .  glucose blood test strip, Check sugar once daily, E 11.9, Disp: 25 each, Rfl: 12 .  Homeopathic Products (LEG CRAMP RELIEF PO), Take 2 capsules by mouth daily. , Disp: , Rfl:  .  hydrochlorothiazide (HYDRODIURIL) 25 MG tablet, Take 1 tablet by mouth once daily, Disp: 90 tablet, Rfl: 1 .  hyoscyamine (LEVBID) 0.375 MG 12 hr tablet, Take 1 tablet (0.375 mg total) by mouth as needed., Disp: 60 tablet, Rfl: 5 .  JANUVIA 100 MG tablet, Take 1 tablet by mouth once daily, Disp: 90 tablet, Rfl: 3 .  JARDIANCE 10 MG TABS tablet, Take 1 tablet by mouth once daily, Disp: 90 tablet, Rfl: 0 .  Lancets (ONETOUCH ULTRASOFT) lancets, Check sugar once daily ,DX E11.9, Disp: 30 each, Rfl: 12 .  losartan (COZAAR)  100 MG tablet, Take 1 tablet by mouth once daily, Disp: 90 tablet, Rfl: 0 .  montelukast (SINGULAIR) 10 MG tablet, TAKE 1 TABLET BY MOUTH AT BEDTIME, Disp: 90 tablet, Rfl: 1 .  Multiple Vitamins-Minerals (ZINC PO), Take by mouth., Disp: , Rfl:  .  mupirocin ointment (BACTROBAN) 2 %, Apply to wound after soaking BID, Disp: 30 g, Rfl: 1 .  pantoprazole (PROTONIX) 40 MG tablet, Take 1 tablet by mouth once daily, Disp: 90 tablet, Rfl: 3 .  rosuvastatin (CRESTOR) 20 MG tablet, Take 1 tablet by mouth once daily, Disp: 90 tablet, Rfl: 3 .  vitamin B-12 (CYANOCOBALAMIN) 1000 MCG tablet, Take 1,000 mcg by mouth daily., Disp: , Rfl:  .  Vitamin D, Cholecalciferol, 50 MCG (2000 UT) CAPS, Take by mouth daily., Disp: , Rfl:   Social  History   Tobacco Use  Smoking Status Former Smoker  . Types: Cigarettes  . Quit date: 07/01/2013  . Years since quitting: 6.4  Smokeless Tobacco Never Used    Allergies  Allergen Reactions  . Amoxicillin-Pot Clavulanate     no further information given.  . Aspirin   . Ducodyl [Bisacodyl]   . Erythromycin     Other reaction(s): Stomach Ache Stomach pain.  . Glipizide   . Metformin Hcl   . Macrobid [Nitrofurantoin]     Severe diarrhea, severe abdominal pain/cramping   Objective:  There were no vitals filed for this visit. There is no height or weight on file to calculate BMI. Constitutional Well developed. Well nourished.  Vascular Dorsalis pedis pulses palpable bilaterally. Posterior tibial pulses palpable bilaterally. Capillary refill normal to all digits.  No cyanosis or clubbing noted. Pedal hair growth normal.  Neurologic Normal speech. Oriented to person, place, and time. Protective sensation absent  Dermatologic Wound Location: Left hallux wound with epidermal lysis/blister formation.  Serosanguineous drainage noted.  The blister has self deroofed.  Redness present up to the midfoot.  No other clinical signs of infection noted.  No purulent drainage noted.  No malodor present. Wound Base: Mixed Granular/Fibrotic Peri-wound: Macerated Exudate: Scant/small amount Serosanguinous exudate Wound Measurements: -See below  Orthopedic: No pain to palpation either foot.   Radiographs: None Assessment:   1. Type 2 diabetes mellitus with diabetic polyneuropathy, without long-term current use of insulin ([place])   2. Skin ulcer of left great toe, limited to breakdown of skin [facility])    Plan:  Patient was evaluated and treated and all questions answered.  Ulcer hallux left wound with full-thickness -Debridement as below. -Dressed with Betadine wet-to-dry, DSD. -Continue off-loading with surgical shoe. -Patient does not recall how the blister formation/epidermal lysis  started however she may think it might be due to the soaks with warm water which may have been too hot versus tight wrapping.  I discussed the importance of not wrapping the toe really tight versus checking the temperature of the water with the back of the hand.  Patient states understanding will continue to do that. -She will follow back up with Dr. Milinda Pointer  Procedure: Excisional Debridement of Wound Tool: Sharp chisel blade/tissue nipper Rationale: Removal of non-viable soft tissue from the wound to promote healing.  Anesthesia: none Pre-Debridement Wound Measurements: 1.5 cm x 1.0 cm x 0.2 cm  Post-Debridement Wound Measurements: 1.6 cm x 1.1 cm x 0.2 cm  Type of Debridement: Sharp Excisional Tissue Removed: Non-viable soft tissue Blood loss: Minimal (<50cc) Depth of Debridement: subcutaneous tissue. Technique: Sharp excisional debridement to bleeding, viable wound base.  Wound Progress:  The wound may have increased from recent thermal injury  however we'll continue to monitor the progression of it. Dressing: Dry, sterile, compression dressing. Disposition: Patient tolerated procedure well. Patient to return in 1 week for follow-up.  No follow-ups on file.

## 2020-01-06 ENCOUNTER — Ambulatory Visit: Payer: Medicare PPO | Admitting: Podiatry

## 2020-01-13 ENCOUNTER — Encounter: Payer: Self-pay | Admitting: Podiatry

## 2020-01-13 ENCOUNTER — Other Ambulatory Visit: Payer: Self-pay

## 2020-01-13 ENCOUNTER — Ambulatory Visit: Payer: Medicare PPO | Admitting: Podiatry

## 2020-01-13 DIAGNOSIS — L97521 Non-pressure chronic ulcer of other part of left foot limited to breakdown of skin: Secondary | ICD-10-CM | POA: Diagnosis not present

## 2020-01-13 DIAGNOSIS — E1142 Type 2 diabetes mellitus with diabetic polyneuropathy: Secondary | ICD-10-CM | POA: Diagnosis not present

## 2020-01-13 DIAGNOSIS — L84 Corns and callosities: Secondary | ICD-10-CM

## 2020-01-13 NOTE — Progress Notes (Signed)
She presents today for follow-up of her ulceration and blister that she still Dr. Posey Pronto for last time. She states that seems to be getting much better. She denies fever chills nausea vomiting muscle aches pains.  Objective: Vital signs are stable alert and oriented x3. There is still mild edema about the hallux left. Ulcerations appear to be healing from deep to superficial. I was able to debride a lot of skin today revealing new fresh pink healthy skin. No breakdowns noted.  Assessment: Diabetic ulceration hallux left.  Plan: Debrided the wound today and place dressing. She will continue to do so follow-up with me in 1 to 2 weeks.

## 2020-01-20 ENCOUNTER — Ambulatory Visit: Payer: Medicare PPO | Admitting: Podiatry

## 2020-01-20 ENCOUNTER — Encounter: Payer: Self-pay | Admitting: Podiatry

## 2020-01-20 ENCOUNTER — Other Ambulatory Visit: Payer: Self-pay

## 2020-01-20 DIAGNOSIS — L97521 Non-pressure chronic ulcer of other part of left foot limited to breakdown of skin: Secondary | ICD-10-CM

## 2020-01-20 DIAGNOSIS — E1142 Type 2 diabetes mellitus with diabetic polyneuropathy: Secondary | ICD-10-CM

## 2020-01-20 NOTE — Progress Notes (Signed)
She presents today for follow-up of her diabetic ulceration left great toe.  She continues to soak in Betadine and warm water.  She applies Betadine directly to the skin.  Dresses the wound.  States that it seems to be improving.  Objective: Vital signs are stable alert oriented x3.  Pulses are palpable.  There is no erythema edema cellulitis drainage or odor the wounds are starting to sort of scab over the toe is much less dense than last time and it has a lot smaller ulcerations largest ulceration measuring about 52mm a total of 3 ulcerations are present on the toe.  They do not probe deep there is no malodor no purulence no signs of infection.  Assessment: Well-healing ulceration, diabetic ulceration hallux left.  Plan: Debridement of ulceration redressed today dressed a compressive dressing with Betadine.  Follow-up with her in about 2 weeks.  She will call sooner with questions or concerns

## 2020-01-22 NOTE — Progress Notes (Signed)
[name],acting as a scribe for Karen Durie, MD.,have documented all relevant documentation on the behalf of Karen Durie, MD,as directed by  Karen Durie, MD while in the presence of Karen Durie, MD.  Established patient visit   Patient: Karen Stephenson   DOB: 1956/03/08   64 y.o. Female  MRN: [id number] Visit Date: 01/27/2020  Today's healthcare provider: [name], MD   Chief Complaint  Patient presents with  . Diabetes  . Hypertension   Subjective    HPI   Patient says that she did not request a refill on her jardiance because she had experienced some bleeding the weekend of 7/4 that went on for about 2 weeks, and stopped because she was not taking her jardiance at the time.  No urinary vaginal or rectal symptoms.  Diabetes Mellitus Type II, follow-up  Lab Results  Component Value Date   HGBA1C 7.3 (A) 01/27/2020   HGBA1C 7.7 (A) 09/23/2019   HGBA1C 8.3 (H) 04/01/2019   Last seen for diabetes 4 months ago.  Management since then includes; Incouraged her to continue to work on diet and exercise.  No changes in meds today.  She is on glimepiride. She reports excellent compliance with treatment. She is not having side effects.   Home blood sugar records: fasting range: good  Episodes of hypoglycemia? No    Current insulin regiment: none  Most Recent Eye Exam: march 2021 --------------------------------------------------------------------------------------------------- Hypertension, follow-up  BP Readings from Last 3 Encounters:  01/27/20 121/79  09/23/19 111/74  07/24/19 112/64   Wt Readings from Last 3 Encounters:  01/27/20 (!) 262 lb 9.6 oz (119.1 kg)  09/23/19 262 lb 12.8 oz (119.2 kg)  07/08/19 267 lb 12.8 oz (121.5 kg)     She was last seen for hypertension 4 months ago.  BP at that visit was 111/74. Management since that visit includes; Controlled on losartan She reports excellent compliance with  treatment. She is not having side effects.  She is not exercising. She is adherent to low salt diet.   Outside blood pressures are not being checked.  She does not smoke.  Use of agents associated with hypertension: none.   ---------------------------------------------------------------------------------------------------  Extreme obesity From 09/23/2019-She has lost 5 pounds since her last visit and encouraged her to continue to work on her weight loss.  Social History   Tobacco Use  . Smoking status: Former Smoker    Types: Cigarettes    Quit date: 07/01/2013    Years since quitting: 6.5  . Smokeless tobacco: Never Used  Substance Use Topics  . Alcohol use: No  . Drug use: No       Medications: Outpatient Medications Prior to Visit  Medication Sig  . acarbose (PRECOSE) 25 MG tablet Take 1 tablet (25 mg total) by mouth 3 (three) times daily with meals.  Marland Kitchen albuterol (VENTOLIN HFA) 108 (90 Base) MCG/ACT inhaler Inhale 2 puffs into the lungs every 6 (six) hours as needed for wheezing or shortness of breath.  Marland Kitchen BIOTIN PO Take 1 tablet by mouth daily.  . cadexomer iodine (IODOSORB) 0.9 % gel Apply 1 application topically daily.  . Cetirizine HCl (ZYRTEC ALLERGY) 10 MG CAPS Take 1 capsule by mouth daily.   . chlorpheniramine-HYDROcodone (TUSSIONEX PENNKINETIC ER) 10-8 MG/5ML SUER Take 5 mLs by mouth every 12 (twelve) hours as needed for cough.  . fluticasone (FLONASE) 50 MCG/ACT nasal spray Use 2 spray(s) in each nostril once daily  . Fluticasone-Salmeterol (  ADVAIR) 250-50 MCG/DOSE AEPB INHALE ONE DOSE BY MOUTH AS NEEDED  . glucose blood test strip Check sugar once daily, E 11.9  . Homeopathic Products (LEG CRAMP RELIEF PO) Take 2 capsules by mouth daily.   . hydrochlorothiazide (HYDRODIURIL) 25 MG tablet Take 1 tablet by mouth once daily  . hyoscyamine (LEVBID) 0.375 MG 12 hr tablet Take 1 tablet (0.375 mg total) by mouth as needed.  Marland Kitchen JANUVIA 100 MG tablet Take 1 tablet by  mouth once daily  . JARDIANCE 10 MG TABS tablet Take 1 tablet by mouth once daily  . Lancets (ONETOUCH ULTRASOFT) lancets Check sugar once daily ,DX E11.9  . losartan (COZAAR) 100 MG tablet Take 1 tablet by mouth once daily  . montelukast (SINGULAIR) 10 MG tablet TAKE 1 TABLET BY MOUTH AT BEDTIME  . Multiple Vitamins-Minerals (ZINC PO) Take by mouth.  . mupirocin ointment (BACTROBAN) 2 % Apply to wound after soaking BID  . pantoprazole (PROTONIX) 40 MG tablet Take 1 tablet by mouth once daily  . rosuvastatin (CRESTOR) 20 MG tablet Take 1 tablet by mouth once daily  . vitamin B-12 (CYANOCOBALAMIN) 1000 MCG tablet Take 1,000 mcg by mouth daily.  . Vitamin D, Cholecalciferol, 50 MCG (2000 UT) CAPS Take by mouth daily.  . [DISCONTINUED] acarbose (PRECOSE) 25 MG tablet TAKE 1 TABLET BY MOUTH THREE TIMES DAILY WITH MEALS  . [DISCONTINUED] doxycycline (VIBRA-TABS) 100 MG tablet Take 1 tablet (100 mg total) by mouth 2 (two) times daily. (Patient not taking: Reported on 01/27/2020)   No facility-administered medications prior to visit.    Review of Systems  Constitutional: Negative for appetite change, chills, fatigue and fever.  Respiratory: Negative for chest tightness and shortness of breath.   Cardiovascular: Negative for chest pain and palpitations.  Gastrointestinal: Negative for abdominal pain, nausea and vomiting.  Neurological: Negative for dizziness and weakness.    Last hemoglobin A1c Lab Results  Component Value Date   HGBA1C 7.3 (A) 01/27/2020      Objective    BP 121/79 (BP Location: Right Arm, Patient Position: Sitting, Cuff Size: Large)   Pulse 73   Temp (!) 96.8 F (36 C) (Temporal)   Ht 5\' 8"  (1.727 m)   Wt (!) 262 lb 9.6 oz (119.1 kg)   BMI 39.93 kg/m  Wt Readings from Last 3 Encounters:  01/27/20 (!) 262 lb 9.6 oz (119.1 kg)  09/23/19 262 lb 12.8 oz (119.2 kg)  07/08/19 267 lb 12.8 oz (121.5 kg)      Physical Exam Vitals reviewed.  Constitutional:       Appearance: Normal appearance.  HENT:     Head: Normocephalic and atraumatic.     Right Ear: External ear normal.     Left Ear: External ear normal.  Eyes:     General: No scleral icterus.    Conjunctiva/sclera: Conjunctivae normal.  Cardiovascular:     Rate and Rhythm: Normal rate and regular rhythm.     Pulses: Normal pulses.     Heart sounds: Normal heart sounds.  Pulmonary:     Effort: Pulmonary effort is normal.     Breath sounds: Normal breath sounds.  Abdominal:     Palpations: Abdomen is soft.  Genitourinary:    General: Normal vulva.     Comments: The cervix is prolapsed through the vaginal  Introitus Bimanual exam benign. Musculoskeletal:     Right lower leg: No edema.     Left lower leg: No edema.  Skin:    General:  Skin is warm and dry.  Neurological:     General: No focal deficit present.     Mental Status: She is alert and oriented to person, place, and time.  Psychiatric:        Mood and Affect: Mood normal.        Behavior: Behavior normal.        Thought Content: Thought content normal.        Judgment: Judgment normal.       Results for orders placed or performed in visit on 01/27/20  POCT HgB A1C  Result Value Ref Range   Hemoglobin A1C 7.3 (A) 4.0 - 5.6 %   Estimated Average Glucose 163     Assessment & Plan     1. Uterine prolapse Refer to gynecology.  I think the small amount of bleeding she is describing is from this. - Ambulatory referral to Gynecology - POCT urinalysis dipstick - Urine Culture  2. Type 2 diabetes mellitus with diabetic polyneuropathy, without long-term current use of insulin (HCC) A1c is 6.7.  Good control of diabetes.  With Jardiance and acarbose - POCT HgB A1C  follow-up 4 months.  3. Essential (primary) hypertension Good control of blood pressure.  On losartan and HCTZ.  4. Hypercholesteremia On Crestor 20.    Return in about 4 months (around 05/29/2020).         Blandon Offerdahl Cranford Mon, MD  [facility] [phone] (phone) [phone] (fax)  [place]

## 2020-01-27 ENCOUNTER — Encounter: Payer: Self-pay | Admitting: Family Medicine

## 2020-01-27 ENCOUNTER — Ambulatory Visit: Payer: Self-pay | Admitting: Family Medicine

## 2020-01-27 ENCOUNTER — Ambulatory Visit: Payer: Medicare PPO | Admitting: Family Medicine

## 2020-01-27 ENCOUNTER — Other Ambulatory Visit: Payer: Self-pay

## 2020-01-27 VITALS — BP 121/79 | HR 73 | Temp 96.8°F | Ht 68.0 in | Wt 262.6 lb

## 2020-01-27 DIAGNOSIS — E1142 Type 2 diabetes mellitus with diabetic polyneuropathy: Secondary | ICD-10-CM | POA: Diagnosis not present

## 2020-01-27 DIAGNOSIS — E7800 Pure hypercholesterolemia, unspecified: Secondary | ICD-10-CM | POA: Diagnosis not present

## 2020-01-27 DIAGNOSIS — I10 Essential (primary) hypertension: Secondary | ICD-10-CM | POA: Diagnosis not present

## 2020-01-27 DIAGNOSIS — N814 Uterovaginal prolapse, unspecified: Secondary | ICD-10-CM

## 2020-01-27 DIAGNOSIS — R82998 Other abnormal findings in urine: Secondary | ICD-10-CM | POA: Diagnosis not present

## 2020-01-27 LAB — POCT URINALYSIS DIPSTICK
Bilirubin, UA: NEGATIVE
Blood, UA: NEGATIVE
Glucose, UA: NEGATIVE
Ketones, UA: NEGATIVE
Nitrite, UA: NEGATIVE
Protein, UA: NEGATIVE
Spec Grav, UA: 1.02 (ref 1.010–1.025)
Urobilinogen, UA: 0.2 E.U./dL
pH, UA: 5 (ref 5.0–8.0)

## 2020-01-27 LAB — POCT GLYCOSYLATED HEMOGLOBIN (HGB A1C)
Estimated Average Glucose: 163
Hemoglobin A1C: 7.3 % — AB (ref 4.0–5.6)

## 2020-01-29 LAB — URINE CULTURE

## 2020-02-03 ENCOUNTER — Other Ambulatory Visit: Payer: Self-pay

## 2020-02-03 ENCOUNTER — Ambulatory Visit: Payer: Medicare PPO | Admitting: Podiatry

## 2020-02-03 ENCOUNTER — Encounter: Payer: Self-pay | Admitting: Podiatry

## 2020-02-03 DIAGNOSIS — E1142 Type 2 diabetes mellitus with diabetic polyneuropathy: Secondary | ICD-10-CM | POA: Diagnosis not present

## 2020-02-03 DIAGNOSIS — L97521 Non-pressure chronic ulcer of other part of left foot limited to breakdown of skin: Secondary | ICD-10-CM

## 2020-02-03 NOTE — Progress Notes (Signed)
She presents today for follow-up of ulceration hallux left.  She states that it has gone away with exception of the small spot right here and she has been applying Iodosorb to it.  She continues to soak the toe in Betadine as well.  Dressing the toe daily.  Denies fever chills nausea vomiting muscle aches pains.  States that her blood sugar is 7.3.  Objective: Vital signs are stable alert and oriented x3 pulses are palpable.  Most debrided reactive hyperkeratotic tissue hallux left demonstrates a small ulcerative lesion plantar medial aspect of the toe measures approximately a centimeter in length and 4 mm in diameter.  No purulence no malodor.  Assessment: Slowly healing diabetic ulceration hallux left.  Plan: Debridement today dressed a compressive dressing applied with follow-up with her in about 2 weeks make sure this wound is healing

## 2020-02-04 ENCOUNTER — Other Ambulatory Visit: Payer: Self-pay | Admitting: Family Medicine

## 2020-02-04 DIAGNOSIS — E119 Type 2 diabetes mellitus without complications: Secondary | ICD-10-CM

## 2020-02-04 NOTE — Telephone Encounter (Signed)
Requested medication (s) are due for refill today: Yes  Requested medication (s) are on the active medication list: Yes  Last refill:  01/22/18  Future visit scheduled: Yes  Notes to clinic:  Prescription has expired.    Requested Prescriptions  Pending Prescriptions Disp Refills   acarbose (PRECOSE) 25 MG tablet [Pharmacy Med Name: Acarbose 25 MG Oral Tablet] 270 tablet 0    Sig: TAKE 1 TABLET BY MOUTH THREE TIMES DAILY WITH MEALS      Endocrinology:  Diabetes - Alpha Glucosidase Inhibitors - acarbose Failed - 02/04/2020 11:25 AM      Failed - AST in normal range and within 90 days    AST  Date Value Ref Range Status  04/01/2019 21 0 - 40 IU/L Final          Failed - ALT in normal range and within 90 days    ALT  Date Value Ref Range Status  04/01/2019 22 0 - 32 IU/L Final          Passed - HBA1C is between 0 and 7.9 and within 180 days    Hemoglobin A1C  Date Value Ref Range Status  01/27/2020 7.3 (A) 4.0 - 5.6 % Final   Hgb A1c MFr Bld  Date Value Ref Range Status  04/01/2019 8.3 (H) 4.8 - 5.6 % Final    Comment:             Prediabetes: 5.7 - 6.4          Diabetes: >6.4          Glycemic control for adults with diabetes: <7.0           Passed - Valid encounter within last 6 months    Recent Outpatient Visits           1 week ago Uterine prolapse   [facility] Jerrol Banana., MD   1 month ago Cough   [facility] Virginia Crews, MD   4 months ago Type 2 diabetes mellitus with diabetic polyneuropathy, without long-term current use of insulin [facility])   [facility] Jerrol Banana., MD   5 months ago COVID-19 virus infection   [facility] Jerrol Banana., MD   10 months ago Annual physical exam   [facility] Jerrol Banana., MD       Future Appointments             In 2 months Jerrol Banana., MD [facility], [place]

## 2020-02-10 ENCOUNTER — Encounter: Payer: Self-pay | Admitting: Obstetrics & Gynecology

## 2020-02-10 ENCOUNTER — Other Ambulatory Visit (HOSPITAL_COMMUNITY)
Admission: RE | Admit: 2020-02-10 | Discharge: 2020-02-10 | Disposition: A | Discharge status: Home / Self Care | Payer: Medicare PPO | Source: Ambulatory Visit | Attending: Obstetrics & Gynecology | Admitting: Obstetrics & Gynecology

## 2020-02-10 ENCOUNTER — Other Ambulatory Visit: Payer: Self-pay

## 2020-02-10 ENCOUNTER — Ambulatory Visit: Payer: Medicare PPO | Admitting: Obstetrics & Gynecology

## 2020-02-10 VITALS — BP 140/80 | Ht 69.0 in | Wt 261.0 lb

## 2020-02-10 DIAGNOSIS — Z124 Encounter for screening for malignant neoplasm of cervix: Secondary | ICD-10-CM | POA: Insufficient documentation

## 2020-02-10 DIAGNOSIS — N814 Uterovaginal prolapse, unspecified: Secondary | ICD-10-CM

## 2020-02-10 DIAGNOSIS — N859 Noninflammatory disorder of uterus, unspecified: Secondary | ICD-10-CM | POA: Diagnosis not present

## 2020-02-10 DIAGNOSIS — Z1151 Encounter for screening for human papillomavirus (HPV): Secondary | ICD-10-CM | POA: Insufficient documentation

## 2020-02-10 DIAGNOSIS — N858 Other specified noninflammatory disorders of uterus: Secondary | ICD-10-CM | POA: Diagnosis not present

## 2020-02-10 DIAGNOSIS — N950 Postmenopausal bleeding: Secondary | ICD-10-CM | POA: Diagnosis not present

## 2020-02-10 NOTE — Patient Instructions (Signed)
Pelvic Organ Prolapse Pelvic organ prolapse is the stretching, bulging, or dropping of pelvic organs into an abnormal position. It happens when the muscles and tissues that surround and support pelvic structures become weak or stretched. Pelvic organ prolapse can involve the:  Vagina (vaginal prolapse).  Uterus (uterine prolapse).  Bladder (cystocele).  Rectum (rectocele).  Intestines (enterocele). When organs other than the vagina are involved, they often bulge into the vagina or protrude from the vagina, depending on how severe the prolapse is. What are the causes? This condition may be caused by:  Pregnancy, labor, and childbirth.  Past pelvic surgery.  Decreased production of the hormone estrogen associated with menopause.  Consistently lifting more than 50 lb (23 kg).  Obesity.  Long-term inability to pass stool (chronic constipation).  A cough that lasts a long time (chronic).  Buildup of fluid in the abdomen due to certain diseases and other conditions. What are the signs or symptoms? Symptoms of this condition include:  Passing a little urine (loss of bladder control) when you cough, sneeze, strain, and exercise (stress incontinence). This may be worse immediately after childbirth. It may gradually improve over time.  Feeling pressure in your pelvis or vagina. This pressure may increase when you cough or when you are passing stool.  A bulge that protrudes from the opening of your vagina.  Difficulty passing urine or stool.  Pain in your lower back.  Pain, discomfort, or disinterest in sex.  Repeated bladder infections (urinary tract infections).  Difficulty inserting a tampon. In some people, this condition causes no symptoms. How is this diagnosed? This condition may be diagnosed based on a vaginal and rectal exam. During the exam, you may be asked to cough and strain while you are lying down, sitting, and standing up. Your health care provider will  determine if other tests are required, such as bladder function tests. How is this treated? Treatment for this condition may depend on your symptoms. Treatment may include:  Lifestyle changes, such as changes to your diet.  Emptying your bladder at scheduled times (bladder training therapy). This can help reduce or avoid urinary incontinence.  Estrogen. Estrogen may help mild prolapse by increasing the strength and tone of pelvic floor muscles.  Kegel exercises. These may help mild cases of prolapse by strengthening and tightening the muscles of the pelvic floor.  A soft, flexible device that helps support the vaginal walls and keep pelvic organs in place (pessary). This is inserted into your vagina by your health care provider.  Surgery. This is often the only form of treatment for severe prolapse. Follow these instructions at home:  Avoid drinking beverages that contain caffeine or alcohol.  Increase your intake of high-fiber foods. This can help decrease constipation and straining during bowel movements.  Lose weight if recommended by your health care provider.  Wear a sanitary pad or adult diapers if you have urinary incontinence.  Avoid heavy lifting and straining with exercise and work. Do not hold your breath when you perform mild to moderate lifting and exercise activities. Limit your activities as directed by your health care provider.  Do Kegel exercises as directed by your health care provider. To do this: ? Squeeze your pelvic floor muscles tight. You should feel a tight lift in your rectal area and a tightness in your vaginal area. Keep your stomach, buttocks, and legs relaxed. ? Hold the muscles tight for up to 10 seconds. ? Relax your muscles. ? Repeat this exercise 50 times a day,   or as many times as told by your health care provider. Continue to do this exercise for at least 4-6 weeks, or for as long as told by your health care provider.  Take over-the-counter and  prescription medicines only as told by your health care provider.  If you have a pessary, take care of it as told by your health care provider.  Keep all follow-up visits as told by your health care provider. This is important. Contact a health care provider if you:  Have symptoms that interfere with your daily activities or sex life.  Need medicine to help with the discomfort.  Notice bleeding from your vagina that is not related to your period.  Have a fever.  Have pain or bleeding when you urinate.  Have bleeding when you pass stool.  Pass urine when you have sex.  Have chronic constipation.  Have a pessary that falls out.  Have bad smelling vaginal discharge.  Have an unusual, low pain in your abdomen. Summary  Pelvic organ prolapse is the stretching, bulging, or dropping of pelvic organs into an abnormal position. It happens when the muscles and tissues that surround and support pelvic structures become weak or stretched.  When organs other than the vagina are involved, they often bulge into the vagina or protrude from the vagina, depending on how severe the prolapse is.  In most cases, this condition needs to be treated only if it produces symptoms. Treatment may include lifestyle changes, estrogen, Kegel exercises, pessary insertion, or surgery.  Avoid heavy lifting and straining with exercise and work. Do not hold your breath when you perform mild to moderate lifting and exercise activities. Limit your activities as directed by your health care provider. This information is not intended to replace advice given to you by your health care provider. Make sure you discuss any questions you have with your health care provider. Document Revised: 07/10/2017 Document Reviewed: 07/10/2017 Elsevier Patient Education  [place].   Endometrial Biopsy, Care After This sheet gives you information about how to care for yourself after your procedure. Your health care  provider may also give you more specific instructions. If you have problems or questions, contact your health care provider. What can I expect after the procedure? After the procedure, it is common to have:  Mild cramping.  A small amount of vaginal bleeding for a few days. This is normal. Follow these instructions at home:   Take over-the-counter and prescription medicines only as told by your health care provider.  Do not douche, use tampons, or have sexual intercourse until your health care provider approves.  Return to your normal activities as told by your health care provider. Ask your health care provider what activities are safe for you.  Follow instructions from your health care provider about any activity restrictions, such as restrictions on strenuous exercise or heavy lifting. Contact a health care provider if:  You have heavy bleeding, or bleed for longer than 2 days after the procedure.  You have bad smelling discharge from your vagina.  You have a fever or chills.  You have a burning sensation when urinating or you have difficulty urinating.  You have severe pain in your lower abdomen. Get help right away if:  You have severe cramps in your stomach or back.  You pass large blood clots.  Your bleeding increases.  You become weak or light-headed, or you pass out. Summary  After the procedure, it is common to have mild cramping and  a small amount of vaginal bleeding for a few days.  Do not douche, use tampons, or have sexual intercourse until your health care provider approves.  Return to your normal activities as told by your health care provider. Ask your health care provider what activities are safe for you. This information is not intended to replace advice given to you by your health care provider. Make sure you discuss any questions you have with your health care provider. Document Revised: 05/31/2017 Document Reviewed: 07/04/2016 Elsevier Patient  Education  [place].

## 2020-02-10 NOTE — Progress Notes (Signed)
Postmenopausal Bleeding Patient is a 64 yo G65 AA F who complains of vaginal bleeding. She has been menopausal for >10 years. Currently on no HRT and has no blood thinners. Bleeding is described as less flow than a normal period and has occurred 1 times.  This was for a few days in early July.  No pain, pressure, LOU, freq, urgency.  She has diminished stream.  She has constipation (chronic).   Other menopausal symptoms include: hot flashes and night sweats fro many years, mild. Workup to date: none.  Menstrual History: OB History   G0, no pregnancies GYN History     Fibroids, s/o myomectomy in [place] by PCP (Dr Rosanna Randy).  Dx w uterine prolapse based on exam.  PMHx: She  has a past medical history of Anginal pain ([place]), Asthma, Diabetes mellitus without complication ([place]), GERD (gastroesophageal reflux disease), Hypercholesteremia, Hypertension, Hypokalemia, and Sleep apnea. Also,  has a past surgical history that includes Back surgery (1986); Nasal sinus surgery; Uterine fibroid surgery; and Colonoscopy with propofol (N/A, 07/17/2017)., family history includes Breast cancer (age of onset: 10) in her mother; Breast cancer (age of onset: 28) in her sister; Diabetes in her mother; Heart disease in her father and paternal grandfather; Hypertension in her brother.,  reports that she quit smoking about 6 years ago. Her smoking use included cigarettes. She has never used smokeless tobacco. She reports that she does not drink alcohol and does not use drugs.  She has a current medication list which includes the following prescription(s): acarbose, albuterol, biotin, cadexomer iodine, cetirizine hcl, chlorpheniramine-hydrocodone, fluticasone, fluticasone-salmeterol, glucose blood, homeopathic products, hydrochlorothiazide, hyoscyamine, januvia, jardiance, onetouch ultrasoft, losartan, montelukast, multiple vitamins-minerals, mupirocin ointment, pantoprazole, rosuvastatin, vitamin b-12, and vitamin d  (cholecalciferol). Also, is allergic to amoxicillin-pot clavulanate, aspirin, ducodyl [bisacodyl], erythromycin, glipizide, metformin hcl, and macrobid [nitrofurantoin].  Review of Systems  Constitutional: Negative for chills, fever and malaise/fatigue.  HENT: Negative for congestion, sinus pain and sore throat.   Eyes: Negative for blurred vision and pain.  Respiratory: Negative for cough and wheezing.   Cardiovascular: Negative for chest pain and leg swelling.  Gastrointestinal: Positive for constipation. Negative for abdominal pain, diarrhea, heartburn, nausea and vomiting.  Genitourinary: Negative for dysuria, frequency, hematuria and urgency.  Musculoskeletal: Positive for joint pain. Negative for back pain, myalgias and neck pain.  Skin: Negative for itching and rash.  Neurological: Positive for headaches. Negative for dizziness, tremors and weakness.  Endo/Heme/Allergies: Does not bruise/bleed easily.  Psychiatric/Behavioral: Negative for depression. The patient is not nervous/anxious and does not have insomnia.     Objective: BP 140/80   Ht 5\' 9"  (1.753 m)   Wt 261 lb (118.4 kg)   BMI 38.54 kg/m  Physical Exam Constitutional:      General: She is not in acute distress.    Appearance: She is well-developed.  Genitourinary:     Pelvic exam was performed with patient supine.     Urethra, bladder, vagina and uterus normal.     No vaginal erythema or bleeding.     No cervical motion tenderness, discharge, polyp or nabothian cyst.     Uterus is mobile.     Uterus is not enlarged.     No uterine mass detected.    Uterus is midaxial.     No right or left adnexal mass present.     Right adnexa not tender.     Left adnexa not tender.     Genitourinary Comments: Cervix prolapse to  introitus w valsalva or cough >30 degree qtip test Uterus small, mobile No cervical lesions, os small (nullip) Min vag atrophy  HENT:     Head: Normocephalic and atraumatic.     Nose: Nose normal.   Abdominal:     General: There is no distension.     Palpations: Abdomen is soft.     Tenderness: There is no abdominal tenderness.  Musculoskeletal:        General: Normal range of motion.  Neurological:     Mental Status: She is alert and oriented to person, place, and time.     Cranial Nerves: No cranial nerve deficit.  Skin:    General: Skin is warm and dry.  Psychiatric:        Attention and Perception: Attention normal.        Mood and Affect: Mood and affect normal.        Speech: Speech normal.        Behavior: Behavior normal.        Thought Content: Thought content normal.        Judgment: Judgment normal.     ASSESSMENT/PLAN:    Problem List Items Addressed This Visit    Post-menopausal bleeding    -  Primary   Relevant Orders   Surgical pathology w EMB, see below   Uterine prolapse        Likely cause for bleeding, if no uterine etiology by way of hyperplasia or cancer    Discussed pessary vs TVH surgery and this would be based on sx's, or worsening prolapse over time    Monitor bladder sx's   Screening for cervical cancer       Relevant Orders   Cytology - PAP    A total of 50 minutes were spent face-to-face with the patient as well as preparation, review, communication, and documentation during this encounter.    Endometrial Biopsy After discussion with the patient regarding her abnormal uterine bleeding I recommended that she proceed with an endometrial biopsy for further diagnosis. The risks, benefits, alternatives, and indications for an endometrial biopsy were discussed with the patient in detail. She understood the risks including infection, bleeding, cervical laceration and uterine perforation.  Verbal consent was obtained.   PROCEDURE NOTE:  [place] endometrial biopsy was performed using aseptic technique with iodine preparation.  The uterus was sounded to a length of 8 cm.  Adequate sampling was obtained with minimal blood loss.  The patient tolerated  the procedure well.  Disposition will be pending pathology.  Barnett Applebaum, MD, [facility], [facility] 02/10/2020  2:12 PM

## 2020-02-16 LAB — SURGICAL PATHOLOGY

## 2020-02-17 ENCOUNTER — Ambulatory Visit (INDEPENDENT_AMBULATORY_CARE_PROVIDER_SITE_OTHER): Payer: Medicare PPO | Admitting: Podiatry

## 2020-02-17 ENCOUNTER — Other Ambulatory Visit: Payer: Self-pay

## 2020-02-17 ENCOUNTER — Encounter: Payer: Self-pay | Admitting: Podiatry

## 2020-02-17 DIAGNOSIS — M79676 Pain in unspecified toe(s): Secondary | ICD-10-CM

## 2020-02-17 DIAGNOSIS — E1142 Type 2 diabetes mellitus with diabetic polyneuropathy: Secondary | ICD-10-CM | POA: Diagnosis not present

## 2020-02-17 DIAGNOSIS — B351 Tinea unguium: Secondary | ICD-10-CM

## 2020-02-17 DIAGNOSIS — L97521 Non-pressure chronic ulcer of other part of left foot limited to breakdown of skin: Secondary | ICD-10-CM | POA: Diagnosis not present

## 2020-02-18 LAB — CYTOLOGY - PAP
Comment: NEGATIVE
Diagnosis: NEGATIVE
Diagnosis: REACTIVE
High risk HPV: NEGATIVE

## 2020-02-18 NOTE — Progress Notes (Signed)
She presents today for follow-up states that is slowly getting better as she refers to her diabetic ulceration hallux left. She denies fever chills nausea vomiting muscle aches and pains states that she continues to put the Betadine on the toe.  Objective: Vital signs are stable alert oriented x3 toe is completely dark brown-orange with Betadine stain. Reactive hyperkeratoses are noted. Once these reactive hyperkeratoses are debrided as noted the superficial ulceration is present beneath one of them this is the source ulceration to heal which is just proximal to the hallux interphalangeal joint plantar medial there is no purulence no malodor no signs of infection. I debrided her toenails 1 through 5 as well because they were thick yellow dystrophic clinically mycotic she does have diabetes with neuropathy as well.  Assessment: Slowly resolving. Preulcerative lesions subfirst and fifth bilateral. Pain in limb secondary to onychomycosis and diabetic peripheral neuropathy.  Plan: Debrided toenails 1 through 5 bilaterally debrided all reactive hyperkeratotic lesions debrided ulceration. She will continue dressing daily and I will follow-up with her in approximately 3 weeks.

## 2020-02-20 ENCOUNTER — Other Ambulatory Visit: Payer: Self-pay | Admitting: Family Medicine

## 2020-02-20 DIAGNOSIS — I10 Essential (primary) hypertension: Secondary | ICD-10-CM

## 2020-02-20 NOTE — Telephone Encounter (Signed)
Requested Prescriptions  Pending Prescriptions Disp Refills  . losartan (COZAAR) 100 MG tablet [Pharmacy Med Name: Losartan Potassium 100 MG Oral Tablet] 90 tablet 0    Sig: Take 1 tablet by mouth once daily     Cardiovascular:  Angiotensin Receptor Blockers Failed - 02/20/2020  7:56 AM      Failed - Cr in normal range and within 180 days    Creatinine, Ser  Date Value Ref Range Status  04/01/2019 0.85 0.57 - 1.00 mg/dL Final         Failed - K in normal range and within 180 days    Potassium  Date Value Ref Range Status  04/01/2019 3.6 3.5 - 5.2 mmol/L Final         Failed - Last BP in normal range    BP Readings from Last 1 Encounters:  02/10/20 140/80         Passed - Patient is not pregnant      Passed - Valid encounter within last 6 months    Recent Outpatient Visits          3 weeks ago Uterine prolapse   [facility] Jerrol Banana., MD   1 month ago Cough   [facility] Virginia Crews, MD   5 months ago Type 2 diabetes mellitus with diabetic polyneuropathy, without long-term current use of insulin [facility])   [facility] Jerrol Banana., MD   6 months ago COVID-19 virus infection   [facility] Jerrol Banana., MD   10 months ago Annual physical exam   [facility] Jerrol Banana., MD      Future Appointments            In 1 month Jerrol Banana., MD [facility] [place]

## 2020-03-10 ENCOUNTER — Other Ambulatory Visit: Payer: Self-pay | Admitting: Family Medicine

## 2020-03-10 DIAGNOSIS — E119 Type 2 diabetes mellitus without complications: Secondary | ICD-10-CM

## 2020-03-16 ENCOUNTER — Encounter: Payer: Self-pay | Admitting: Podiatry

## 2020-03-16 ENCOUNTER — Ambulatory Visit: Payer: Medicare PPO | Admitting: Podiatry

## 2020-03-16 ENCOUNTER — Other Ambulatory Visit: Payer: Self-pay

## 2020-03-16 DIAGNOSIS — L97521 Non-pressure chronic ulcer of other part of left foot limited to breakdown of skin: Secondary | ICD-10-CM | POA: Diagnosis not present

## 2020-03-16 DIAGNOSIS — E1142 Type 2 diabetes mellitus with diabetic polyneuropathy: Secondary | ICD-10-CM | POA: Diagnosis not present

## 2020-03-16 NOTE — Progress Notes (Signed)
She presents today for follow-up of her ulceration left hallux.  She states that is doing good everything seems to be going well at this point.  Objective: Vital signs are stable she alert oriented x3 no erythema edema/drainage or odor reactive hyperkeratosis overlying ulcerative areas debrided today demonstrates minimal openings no purulence no malodor no signs of infection to the hallux left subfifth right subfifth left.  Assessment: Preulcerative lesions and resolving ulcerative lesion bilateral foot.  Plan: We will follow-up with her in about a month.  Continue conservative therapies.

## 2020-03-23 ENCOUNTER — Other Ambulatory Visit: Payer: Self-pay | Admitting: Family Medicine

## 2020-03-23 DIAGNOSIS — E7800 Pure hypercholesterolemia, unspecified: Secondary | ICD-10-CM

## 2020-03-23 DIAGNOSIS — J302 Other seasonal allergic rhinitis: Secondary | ICD-10-CM

## 2020-03-23 NOTE — Telephone Encounter (Signed)
Requested Prescriptions  Pending Prescriptions Disp Refills  . montelukast (SINGULAIR) 10 MG tablet [Pharmacy Med Name: Montelukast Sodium 10 MG Oral Tablet] 90 tablet 0    Sig: TAKE 1 TABLET BY MOUTH AT BEDTIME     Pulmonology:  Leukotriene Inhibitors Passed - 03/23/2020  1:57 PM      Passed - Valid encounter within last 12 months    Recent Outpatient Visits          1 month ago Uterine prolapse   [facility] Jerrol Banana., MD   3 months ago Cough   [facility] [place], Dionne Bucy, MD   6 months ago Type 2 diabetes mellitus with diabetic polyneuropathy, without long-term current use of insulin [facility])   [facility] Jerrol Banana., MD   7 months ago COVID-19 virus infection   [facility] Jerrol Banana., MD   11 months ago Annual physical exam   [facility] Jerrol Banana., MD      Future Appointments            In 2 weeks Jerrol Banana., MD [facility] [place]           . rosuvastatin (CRESTOR) 20 MG tablet [Pharmacy Med Name: Rosuvastatin Calcium 20 MG Oral Tablet] 90 tablet 0    Sig: Take 1 tablet by mouth once daily     Cardiovascular:  Antilipid - Statins Failed - 03/23/2020  1:57 PM      Failed - LDL in normal range and within 360 days    LDL Chol Calc (NIH)  Date Value Ref Range Status  04/01/2019 61 0 - 99 mg/dL Final         Failed - HDL in normal range and within 360 days    HDL  Date Value Ref Range Status  04/01/2019 32 (L) >39 mg/dL Final         Passed - Total Cholesterol in normal range and within 360 days    Cholesterol, Total  Date Value Ref Range Status  04/01/2019 114 100 - 199 mg/dL Final         Passed - Triglycerides in normal range and within 360 days    Triglycerides  Date Value Ref Range Status  04/01/2019 114 0 - 149 mg/dL Final         Passed - Patient is not pregnant      Passed - Valid encounter within last  12 months    Recent Outpatient Visits          1 month ago Uterine prolapse   [facility] Jerrol Banana., MD   3 months ago Cough   [facility] [place], Dionne Bucy, MD   6 months ago Type 2 diabetes mellitus with diabetic polyneuropathy, without long-term current use of insulin [facility])   [facility] Jerrol Banana., MD   7 months ago COVID-19 virus infection   [facility] Jerrol Banana., MD   11 months ago Annual physical exam   [facility] Jerrol Banana., MD      Future Appointments            In 2 weeks Jerrol Banana., MD [facility], [place]

## 2020-04-06 ENCOUNTER — Other Ambulatory Visit: Payer: Self-pay

## 2020-04-06 ENCOUNTER — Ambulatory Visit (INDEPENDENT_AMBULATORY_CARE_PROVIDER_SITE_OTHER): Payer: Medicare PPO | Admitting: Family Medicine

## 2020-04-06 ENCOUNTER — Encounter: Payer: Self-pay | Admitting: Family Medicine

## 2020-04-06 VITALS — BP 146/82 | HR 73 | Temp 98.7°F | Ht 68.0 in | Wt 266.0 lb

## 2020-04-06 DIAGNOSIS — Z23 Encounter for immunization: Secondary | ICD-10-CM | POA: Diagnosis not present

## 2020-04-06 DIAGNOSIS — Z0000 Encounter for general adult medical examination without abnormal findings: Secondary | ICD-10-CM | POA: Diagnosis not present

## 2020-04-06 NOTE — Progress Notes (Signed)
Medicare Initial Preventative Physical Exam    Patient: Karen Stephenson, Female    DOB: January 12, 1956, 64 y.o.   MRN: [id number] Visit Date: 04/06/2020  Today's Provider: [name], MD   Chief Complaint  Patient presents with  . Annual Exam   Subjective    Medicare Initial Preventative Physical Exam Karen Stephenson is a 64 y.o. female who presents today for her Initial Preventative Physical Exam.   HPI  Social History   Socioeconomic History  . Marital status: Married    Spouse name: Not on file  . Number of children: Not on file  . Years of education: Not on file  . Highest education level: Not on file  Occupational History  . Not on file  Tobacco Use  . Smoking status: Former Smoker    Types: Cigarettes    Quit date: 07/01/2013    Years since quitting: 6.7  . Smokeless tobacco: Never Used  Vaping Use  . Vaping Use: Never used  Substance and Sexual Activity  . Alcohol use: No  . Drug use: No  . Sexual activity: Not on file  Other Topics Concern  . Not on file  Social History Narrative  . Not on file   Social Determinants of Health   Financial Resource Strain:   . Difficulty of Paying Living Expenses: Not on file  Food Insecurity:   . Worried About Charity fundraiser in the Last Year: Not on file  . Ran Out of Food in the Last Year: Not on file  Transportation Needs:   . Lack of Transportation (Medical): Not on file  . Lack of Transportation (Non-Medical): Not on file  Physical Activity:   . Days of Exercise per Week: Not on file  . Minutes of Exercise per Session: Not on file  Stress:   . Feeling of Stress : Not on file  Social Connections:   . Frequency of Communication with Friends and Family: Not on file  . Frequency of Social Gatherings with Friends and Family: Not on file  . Attends Religious Services: Not on file  . Active Member of Clubs or Organizations: Not on file  . Attends Archivist Meetings: Not on file  . Marital  Status: Not on file  Intimate Partner Violence:   . Fear of Current or Ex-Partner: Not on file  . Emotionally Abused: Not on file  . Physically Abused: Not on file  . Sexually Abused: Not on file    Past Medical History:  Diagnosis Date  . Anginal pain ([place])   . Asthma   . Diabetes mellitus without complication ([place])   . GERD (gastroesophageal reflux disease)   . Hypercholesteremia   . Hypertension   . Hypokalemia   . Sleep apnea      Patient Active Problem List   Diagnosis Date Noted  . Diabetic foot ulcer ([place]) 01/12/2019  . Chronic venous insufficiency 12/01/2018  . Lower extremity ulceration, unspecified laterality, with fat layer exposed ([place]) 12/01/2018  . Diabetes mellitus without complication ([place]) 50/27/7412  . Airway hyperreactivity 12/13/2014  . Acid reflux 12/13/2014  . Hypercholesteremia 12/13/2014  . Decreased potassium in the blood 12/13/2014  . Cramps of lower extremity 12/13/2014  . Extreme obesity 12/13/2014  . Avitaminosis D 12/13/2014  . Dermatophytic onychia 12/24/2013  . Bony exostosis 12/24/2013  . Unstable angina pectoris ([place]) 12/24/2013  . Polyneuropathy 12/24/2013  . Family history of breast cancer 03/01/2008  . Obstructive apnea 12/26/2007  .  Diaphragmatic hernia 10/08/2006  . Allergic rhinitis, seasonal 10/08/2006  . Diabetes mellitus, type 2 ([place]) 10/08/2006  . Essential (primary) hypertension 12/29/1999    Past Surgical History:  Procedure Laterality Date  . BACK SURGERY  1986  . COLONOSCOPY WITH PROPOFOL N/A 07/17/2017   Procedure: COLONOSCOPY WITH PROPOFOL;  Surgeon: Manya Silvas, MD;  Location: [facility] ENDOSCOPY;  Service: Endoscopy;  Laterality: N/A;  . NASAL SINUS SURGERY    . UTERINE FIBROID SURGERY      Her family history includes Breast cancer (age of onset: 52) in her mother; Breast cancer (age of onset: 45) in her sister; Diabetes in her mother; Heart disease in her father and paternal grandfather; Hypertension in her  brother. There is no history of Colon cancer or Ovarian cancer.   Current Outpatient Medications:  .  acarbose (PRECOSE) 25 MG tablet, TAKE 1 TABLET BY MOUTH THREE TIMES DAILY WITH MEALS, Disp: 270 tablet, Rfl: 0 .  albuterol (VENTOLIN HFA) 108 (90 Base) MCG/ACT inhaler, Inhale 2 puffs into the lungs every 6 (six) hours as needed for wheezing or shortness of breath., Disp: 8 g, Rfl: 0 .  BIOTIN PO, Take 1 tablet by mouth daily., Disp: , Rfl:  .  cadexomer iodine (IODOSORB) 0.9 % gel, Apply 1 application topically daily., Disp: 40 g, Rfl: 3 .  Cetirizine HCl (ZYRTEC ALLERGY) 10 MG CAPS, Take 1 capsule by mouth daily. , Disp: , Rfl:  .  chlorpheniramine-HYDROcodone (TUSSIONEX PENNKINETIC ER) 10-8 MG/5ML SUER, Take 5 mLs by mouth every 12 (twelve) hours as needed for cough., Disp: 140 mL, Rfl: 0 .  fluticasone (FLONASE) 50 MCG/ACT nasal spray, Use 2 spray(s) in each nostril once daily, Disp: 16 g, Rfl: 3 .  Fluticasone-Salmeterol (ADVAIR) 250-50 MCG/DOSE AEPB, INHALE ONE DOSE BY MOUTH AS NEEDED, Disp: 60 each, Rfl: 12 .  glucose blood test strip, Check sugar once daily, E 11.9, Disp: 25 each, Rfl: 12 .  Homeopathic Products (LEG CRAMP RELIEF PO), Take 2 capsules by mouth daily. , Disp: , Rfl:  .  hydrochlorothiazide (HYDRODIURIL) 25 MG tablet, Take 1 tablet by mouth once daily, Disp: 90 tablet, Rfl: 1 .  hyoscyamine (LEVBID) 0.375 MG 12 hr tablet, Take 1 tablet (0.375 mg total) by mouth as needed., Disp: 60 tablet, Rfl: 5 .  Lancets (ONETOUCH ULTRASOFT) lancets, Check sugar once daily ,DX E11.9, Disp: 30 each, Rfl: 12 .  losartan (COZAAR) 100 MG tablet, Take 1 tablet by mouth once daily, Disp: 90 tablet, Rfl: 0 .  montelukast (SINGULAIR) 10 MG tablet, TAKE 1 TABLET BY MOUTH AT BEDTIME, Disp: 90 tablet, Rfl: 0 .  Multiple Vitamins-Minerals (ZINC PO), Take by mouth., Disp: , Rfl:  .  mupirocin ointment (BACTROBAN) 2 %, Apply to wound after soaking BID, Disp: 30 g, Rfl: 1 .  pantoprazole (PROTONIX) 40  MG tablet, Take 1 tablet by mouth once daily, Disp: 90 tablet, Rfl: 3 .  rosuvastatin (CRESTOR) 20 MG tablet, Take 1 tablet by mouth once daily, Disp: 90 tablet, Rfl: 0 .  vitamin B-12 (CYANOCOBALAMIN) 1000 MCG tablet, Take 1,000 mcg by mouth daily., Disp: , Rfl:  .  Vitamin D, Cholecalciferol, 50 MCG (2000 UT) CAPS, Take by mouth daily., Disp: , Rfl:  .  JANUVIA 100 MG tablet, Take 1 tablet by mouth once daily (Patient not taking: Reported on 04/06/2020), Disp: 90 tablet, Rfl: 0 .  JARDIANCE 10 MG TABS tablet, Take 1 tablet by mouth once daily (Patient not taking: Reported on 04/06/2020), Disp: 90  tablet, Rfl: 0   Patient Care Team: Jerrol Banana., MD as PCP - General (Family Medicine)  Review of Systems  Constitutional: Negative.   HENT: Negative.   Eyes: Negative.   Respiratory: Negative.   Cardiovascular: Negative.   Gastrointestinal: Negative.   Endocrine: Negative.   Genitourinary: Negative.   Musculoskeletal: Positive for arthralgias. Negative for back pain, gait problem, joint swelling, myalgias, neck pain and neck stiffness.  Skin: Positive for wound. Negative for color change, pallor and rash.  Allergic/Immunologic: Positive for environmental allergies. Negative for food allergies and immunocompromised state.  Neurological: Positive for numbness. Negative for dizziness, tremors, seizures, syncope, facial asymmetry, speech difficulty, weakness, light-headedness and headaches.  Hematological: Negative.   Psychiatric/Behavioral: Negative.     Last hemoglobin A1c Lab Results  Component Value Date   HGBA1C 7.3 (A) 01/27/2020      Objective    Vitals: BP (!) 146/82   Pulse 73   Temp 98.7 F (37.1 C)   Ht 5\' 8"  (1.727 m)   Wt 266 lb (120.7 kg)   BMI 40.45 kg/m   Hearing Screening   125Hz  250Hz  500Hz  1000Hz  2000Hz  3000Hz  4000Hz  6000Hz  8000Hz   Right ear:           Left ear:             Visual Acuity Screening   Right eye Left eye Both eyes  Without correction:      With correction: 20/15 20/15 20/15    Physical Exam Vitals and nursing note reviewed.  Constitutional:      Appearance: She is well-developed. She is obese.  HENT:     Head: Normocephalic and atraumatic.     Right Ear: External ear normal.     Left Ear: External ear normal.  Eyes:     General: No scleral icterus.    Conjunctiva/sclera: Conjunctivae normal.  Neck:     Thyroid: No thyromegaly.     Vascular: No carotid bruit.  Cardiovascular:     Rate and Rhythm: Normal rate and regular rhythm.     Pulses: Normal pulses.     Heart sounds: Normal heart sounds.  Pulmonary:     Effort: Pulmonary effort is normal.     Breath sounds: Normal breath sounds.  Chest:     Breasts:        Right: No swelling, bleeding, inverted nipple, mass, nipple discharge, skin change or tenderness.        Left: No swelling, bleeding, inverted nipple, mass, nipple discharge, skin change or tenderness.  Abdominal:     Palpations: Abdomen is soft.  Lymphadenopathy:     Cervical: No cervical adenopathy.  Skin:    General: Skin is warm and dry.  Neurological:     Mental Status: She is alert and oriented to person, place, and time.  Psychiatric:        Behavior: Behavior normal.        Thought Content: Thought content normal.        Judgment: Judgment normal.    Diabetic foot exam normal although patient has neuropathic symptoms.   Activities of Daily Living No flowsheet data found.  Fall Risk Assessment Fall Risk  04/01/2019 03/26/2018 11/14/2016  Falls in the past year? 0 No No  Number falls in past yr: 0 - -  Injury with Fall? 0 - -  Follow up Falls evaluation completed - -     Depression Screen PHQ 2/9 Scores 04/01/2019 03/26/2018 03/26/2018 11/20/2017  PHQ - 2 Score  0 0 0 0  PHQ- 9 Score - - 0 -    6CIT Screen 04/06/2020  What Year? 0 points  What month? 0 points  What time? 0 points  Count back from 20 0 points  Months in reverse 0 points  Repeat phrase 2 points  Total Score 2     No results found for any visits on 04/06/20.  Assessment & Plan      Initial Preventative Physical Exam  Reviewed patient's Family Medical History Reviewed and updated list of patient's medical providers Assessment of cognitive impairment was done Assessed patient's functional ability Established a written schedule for health screening [place] Completed and Reviewed  Exercise Activities and Dietary recommendations Goals    . Exercise 150 minutes per week (moderate activity)       Immunization History  Administered Date(s) Administered  . Influenza Whole 04/23/2017  . Influenza,inj,Quad PF,6+ Mos 03/26/2018, 04/01/2019  . PFIZER SARS-COV-2 Vaccination 10/31/2019, 11/24/2019  . Pneumococcal Polysaccharide-23 02/29/2012  . Td 08/17/2003  . Tdap 04/18/2011    Health Maintenance  Topic Date Due  . HIV Screening  Never done  . MAMMOGRAM  04/04/2019  . DEXA SCAN  Never done  . PNA vac Low Risk Adult (1 of 2 - PCV13) 05/04/2019  . FOOT EXAM  01/12/2020  . INFLUENZA VACCINE  01/31/2020  . HEMOGLOBIN A1C  07/29/2020  . OPHTHALMOLOGY EXAM  08/04/2020  . TETANUS/TDAP  04/17/2021  . PAP SMEAR-Modifier  02/10/2023  . COLONOSCOPY  07/18/2027  . COVID-19 Vaccine  Completed  . Hepatitis C Screening  Completed     Discussed health benefits of physical activity, and encouraged her to engage in regular exercise appropriate for her age and condition.   1. Welcome to Medicare preventive visit Uterine prolapse and bleeding followed by Dr. Kenton Kingfisher from [place].  Repeat colonoscopy 2029. - EKG 12-Lead  2. Need for 23-polyvalent pneumococcal polysaccharide vaccine  - Pneumococcal polysaccharide vaccine 23-valent greater than or equal to 2yo subcutaneous/IM  3. Need for influenza vaccination Patient has had both Covid vaccines - Flu Vaccine QUAD High Dose(Fluad)   No follow-ups on file.     I, [name], MD, have reviewed all documentation for  this visit. The documentation on 04/12/20 for the exam, diagnosis, procedures, and orders are all accurate and complete.    Carletha Dawn Cranford Mon, MD  [facility] [phone] (phone) [phone] (fax)  [place]

## 2020-04-27 ENCOUNTER — Encounter: Payer: Self-pay | Admitting: Podiatry

## 2020-04-27 ENCOUNTER — Other Ambulatory Visit: Payer: Self-pay

## 2020-04-27 ENCOUNTER — Ambulatory Visit: Payer: Medicare PPO | Admitting: Podiatry

## 2020-04-27 DIAGNOSIS — L97521 Non-pressure chronic ulcer of other part of left foot limited to breakdown of skin: Secondary | ICD-10-CM | POA: Diagnosis not present

## 2020-04-27 DIAGNOSIS — E1142 Type 2 diabetes mellitus with diabetic polyneuropathy: Secondary | ICD-10-CM

## 2020-04-27 DIAGNOSIS — M79676 Pain in unspecified toe(s): Secondary | ICD-10-CM

## 2020-04-27 DIAGNOSIS — B351 Tinea unguium: Secondary | ICD-10-CM | POA: Diagnosis not present

## 2020-04-27 DIAGNOSIS — L84 Corns and callosities: Secondary | ICD-10-CM | POA: Diagnosis not present

## 2020-04-27 NOTE — Progress Notes (Signed)
She presents today for chief complaint of painfully elongated toenails as well as follow-up of the ulcerations to the hallux bilaterally.  She states that they seem to be doing better but my shoes are starting to fall apart and she refers to bilateral [place] shoes.  Objective: Vital signs are stable she is alert and oriented x3 pulses are palpable.  Currently she has reactive hyperkeratotic tissue was debrided does not demonstrate any type of opening at this point obviously is still an area that demonstrates an old breakdown but currently there are no open wounds.  No purulence no malodor no signs of infection toenails are long thick yellow dystrophic with mycotic.  Assessment: Diabetic ulceration hallux bilaterally subfifth met bilaterally.  Pain in limb secondary to onychomycosis.  Plan: Debridement of toenails 1 through 5 bilaterally also debridement of ulceration x2.  Debridement of preulcerative lesions plantar aspect of the fifth met bilateral.

## 2020-05-15 ENCOUNTER — Other Ambulatory Visit: Payer: Self-pay | Admitting: Family Medicine

## 2020-05-15 DIAGNOSIS — E119 Type 2 diabetes mellitus without complications: Secondary | ICD-10-CM

## 2020-05-15 DIAGNOSIS — I10 Essential (primary) hypertension: Secondary | ICD-10-CM

## 2020-05-15 NOTE — Telephone Encounter (Signed)
Requested medications are due for refill today?  Yes   Requested medications are on active medication list?  Yes  Last Refill:     HCTZ  - 11/14/2019  # 90 with one refill   (No labs within the past 360 days.  Last labs were performed on 04/01/2019.)  Losartan - 02/20/2020  # 90 with no refill (No labs within the past 180 days.  Last labs were performed on 04/01/2019.)  Acarbose - 02/05/2020  # 270 with no refills (No labs (AST / ALT) within the past 90 days.  Last lab were performed on 04/01/2019.)  Future visit scheduled?  No   Notes to Clinic:  Medications failed Rx refill protocol due to no labs within in the established time frames.  Please see above.

## 2020-05-16 ENCOUNTER — Encounter: Payer: Self-pay | Admitting: Family Medicine

## 2020-06-07 NOTE — Progress Notes (Signed)
Established patient visit   Patient: Karen Stephenson   DOB: Nov 07, 1955   64 y.o. Female  MRN: [id number] Visit Date: 06/08/2020  Today's healthcare provider: [name], MD   Chief Complaint  Patient presents with  . Diabetes   Subjective    HPI  Feels well and has no complaints. Diabetes Mellitus Type II, follow-up  Lab Results  Component Value Date   HGBA1C 7.3 (A) 01/27/2020   HGBA1C 7.7 (A) 09/23/2019   HGBA1C 8.3 (H) 04/01/2019   Last seen for diabetes 3 months ago.  Management since then includes; Good control of diabetes.  With Jardiance and acarbose. She reports excellent compliance with treatment. She is not having side effects.   Home blood sugar records: not being checked   Episodes of hypoglycemia? No    Current insulin regiment: none Most Recent Eye Exam: UTD  --------------------------------------------------------------------------------------------------- Hypertension, follow-up  BP Readings from Last 3 Encounters:  06/08/20 (!) 149/75  04/06/20 (!) 146/82  02/10/20 140/80   Wt Readings from Last 3 Encounters:  06/08/20 269 lb (122 kg)  04/06/20 266 lb (120.7 kg)  02/10/20 261 lb (118.4 kg)     She was last seen for hypertension 3 months ago.  BP at that visit was 121/79. Management since that visit includes; Good control of blood pressure.  On losartan and HCTZ. She reports excellent compliance with treatment. She is not having side effects.  She is not exercising. She is adherent to low salt diet.   Outside blood pressures are not being checked.  She does not smoke.  Use of agents associated with hypertension: none.   --------------------------------------------------------------------------------------------------- Lipid/Cholesterol, follow-up  Last Lipid Panel: Lab Results  Component Value Date   CHOL 114 04/01/2019   LDLCALC 61 04/01/2019   HDL 32 (L) 04/01/2019   TRIG 114 04/01/2019    She was last seen for  this 04/01/2019.  Management since that visit includes; On Crestor 20. She reports excellent compliance with treatment. She is not having side effects.  She is following a Regular diet. Current exercise: none  Last metabolic panel Lab Results  Component Value Date   GLUCOSE 147 (H) 04/01/2019   NA 140 04/01/2019   K 3.6 04/01/2019   BUN 14 04/01/2019   CREATININE 0.85 04/01/2019   GFRNONAA 73 04/01/2019   GFRAA 84 04/01/2019   CALCIUM 9.6 04/01/2019   AST 21 04/01/2019   ALT 22 04/01/2019   The ASCVD Risk score (Goff DC Jr., et al., 2013) failed to calculate for the following reasons:   The valid total cholesterol range is 130 to 320 mg/dL  ---------------------------------------------------------------------------------------------------   Patient Active Problem List   Diagnosis Date Noted  . Diabetic foot ulcer ([place]) 01/12/2019  . Chronic venous insufficiency 12/01/2018  . Lower extremity ulceration, unspecified laterality, with fat layer exposed ([place]) 12/01/2018  . Diabetes mellitus without complication ([place]) 02/72/5366  . Airway hyperreactivity 12/13/2014  . Acid reflux 12/13/2014  . Hypercholesteremia 12/13/2014  . Decreased potassium in the blood 12/13/2014  . Cramps of lower extremity 12/13/2014  . Extreme obesity 12/13/2014  . Avitaminosis D 12/13/2014  . Dermatophytic onychia 12/24/2013  . Bony exostosis 12/24/2013  . Unstable angina pectoris ([place]) 12/24/2013  . Polyneuropathy 12/24/2013  . Family history of breast cancer 03/01/2008  . Obstructive apnea 12/26/2007  . Diaphragmatic hernia 10/08/2006  . Allergic rhinitis, seasonal 10/08/2006  . Diabetes mellitus, type 2 ([place]) 10/08/2006  . Essential (primary) hypertension 12/29/1999   Social History  Tobacco Use  . Smoking status: Former Smoker    Types: Cigarettes    Quit date: 07/01/2013    Years since quitting: 6.9  . Smokeless tobacco: Never Used  Vaping Use  . Vaping Use: Never used  Substance  Use Topics  . Alcohol use: No  . Drug use: No   Allergies  Allergen Reactions  . Amoxicillin-Pot Clavulanate     no further information given.  . Aspirin   . Ducodyl [Bisacodyl]   . Erythromycin     Other reaction(s): Stomach Ache Stomach pain.  . Glipizide   . Metformin Hcl   . Macrobid [Nitrofurantoin]     Severe diarrhea, severe abdominal pain/cramping       Medications: Outpatient Medications Prior to Visit  Medication Sig  . acarbose (PRECOSE) 25 MG tablet TAKE 1 TABLET BY MOUTH THREE TIMES DAILY WITH MEALS  . albuterol (VENTOLIN HFA) 108 (90 Base) MCG/ACT inhaler Inhale 2 puffs into the lungs every 6 (six) hours as needed for wheezing or shortness of breath.  Marland Kitchen BIOTIN PO Take 1 tablet by mouth daily.  . cadexomer iodine (IODOSORB) 0.9 % gel Apply 1 application topically daily.  . Cetirizine HCl (ZYRTEC ALLERGY) 10 MG CAPS Take 1 capsule by mouth daily.   . chlorpheniramine-HYDROcodone (TUSSIONEX PENNKINETIC ER) 10-8 MG/5ML SUER Take 5 mLs by mouth every 12 (twelve) hours as needed for cough.  . fluticasone (FLONASE) 50 MCG/ACT nasal spray Use 2 spray(s) in each nostril once daily  . Fluticasone-Salmeterol (ADVAIR) 250-50 MCG/DOSE AEPB INHALE ONE DOSE BY MOUTH AS NEEDED  . glucose blood test strip Check sugar once daily, E 11.9  . Homeopathic Products (LEG CRAMP RELIEF PO) Take 2 capsules by mouth daily.   . hydrochlorothiazide (HYDRODIURIL) 25 MG tablet Take 1 tablet by mouth once daily  . hyoscyamine (LEVBID) 0.375 MG 12 hr tablet Take 1 tablet (0.375 mg total) by mouth as needed.  Marland Kitchen JANUVIA 100 MG tablet Take 1 tablet by mouth once daily  . Lancets (ONETOUCH ULTRASOFT) lancets Check sugar once daily ,DX E11.9  . losartan (COZAAR) 100 MG tablet Take 1 tablet by mouth once daily  . montelukast (SINGULAIR) 10 MG tablet TAKE 1 TABLET BY MOUTH AT BEDTIME  . Multiple Vitamins-Minerals (ZINC PO) Take by mouth.  . mupirocin ointment (BACTROBAN) 2 % Apply to wound after soaking  BID  . pantoprazole (PROTONIX) 40 MG tablet Take 1 tablet by mouth once daily  . rosuvastatin (CRESTOR) 20 MG tablet Take 1 tablet by mouth once daily  . vitamin B-12 (CYANOCOBALAMIN) 1000 MCG tablet Take 1,000 mcg by mouth daily.  . Vitamin D, Cholecalciferol, 50 MCG (2000 UT) CAPS Take by mouth daily.  Marland Kitchen JARDIANCE 10 MG TABS tablet Take 1 tablet by mouth once daily (Patient not taking: Reported on 06/08/2020)   No facility-administered medications prior to visit.    Review of Systems  Constitutional: Negative for appetite change, chills, fatigue and fever.  Respiratory: Negative for chest tightness and shortness of breath.   Cardiovascular: Negative for chest pain and palpitations.  Gastrointestinal: Negative for abdominal pain, nausea and vomiting.  Neurological: Negative for dizziness and weakness.    Last CBC Lab Results  Component Value Date   WBC 7.3 04/01/2019   HGB 13.0 04/01/2019   HCT 40.1 04/01/2019   MCV 84 04/01/2019   MCH 27.3 04/01/2019   RDW 13.6 04/01/2019   PLT 264 04/01/2019      Objective    BP (!) 149/75 (BP Location:  Right Arm, Patient Position: Sitting, Cuff Size: Large)   Pulse 79   Temp 98.2 F (36.8 C) (Oral)   Resp 16   Ht 5\' 8"  (1.727 m)   Wt 269 lb (122 kg)   BMI 40.90 kg/m  BP Readings from Last 3 Encounters:  06/08/20 (!) 149/75  04/06/20 (!) 146/82  02/10/20 140/80   Wt Readings from Last 3 Encounters:  06/08/20 269 lb (122 kg)  04/06/20 266 lb (120.7 kg)  02/10/20 261 lb (118.4 kg)      Physical Exam Vitals and nursing note reviewed.  Constitutional:      Appearance: She is well-developed. She is obese.  HENT:     Head: Normocephalic and atraumatic.     Right Ear: External ear normal.     Left Ear: External ear normal.  Eyes:     General: No scleral icterus.    Conjunctiva/sclera: Conjunctivae normal.  Neck:     Thyroid: No thyromegaly.     Vascular: No carotid bruit.  Cardiovascular:     Rate and Rhythm: Normal rate  and regular rhythm.     Pulses: Normal pulses.     Heart sounds: Normal heart sounds.  Pulmonary:     Effort: Pulmonary effort is normal.     Breath sounds: Normal breath sounds.  Chest:  Breasts:     Right: No swelling, bleeding, inverted nipple, mass, nipple discharge, skin change or tenderness.     Left: No swelling, bleeding, inverted nipple, mass, nipple discharge, skin change or tenderness.    Abdominal:     Palpations: Abdomen is soft.  Musculoskeletal:     Right lower leg: No edema.     Left lower leg: No edema.  Lymphadenopathy:     Cervical: No cervical adenopathy.  Skin:    General: Skin is warm and dry.  Neurological:     General: No focal deficit present.     Mental Status: She is alert and oriented to person, place, and time.  Psychiatric:        Mood and Affect: Mood normal.        Behavior: Behavior normal.        Thought Content: Thought content normal.        Judgment: Judgment normal.       No results found for any visits on 06/08/20.  Assessment & Plan     1. Type 2 diabetes mellitus with diabetic polyneuropathy, without long-term current use of insulin (HCC)-A1c was 7.3.  Goal less than 7.5 On Precose, new VA, and Jardiance.  Patient states she is not taking the Jardiance. - Hemoglobin A1c  2. Essential (primary) hypertension On HCTZ, losartan, - CBC w/Diff/Platelet - Comprehensive Metabolic Panel (CMET)  3. Hypercholesteremia On rosuvastatin - Lipid panel - TSH - Comprehensive Metabolic Panel (CMET)  4. Extreme obesity Diet and exercise has been discussed.   No follow-ups on file.         Jaceyon Strole Cranford Mon, MD  [facility] [phone] (phone) [phone] (fax)  [place]

## 2020-06-08 ENCOUNTER — Other Ambulatory Visit: Payer: Self-pay

## 2020-06-08 ENCOUNTER — Ambulatory Visit: Payer: Medicare PPO | Admitting: Podiatry

## 2020-06-08 ENCOUNTER — Encounter: Payer: Self-pay | Admitting: Family Medicine

## 2020-06-08 ENCOUNTER — Encounter: Payer: Self-pay | Admitting: Podiatry

## 2020-06-08 ENCOUNTER — Ambulatory Visit (INDEPENDENT_AMBULATORY_CARE_PROVIDER_SITE_OTHER): Payer: Medicare PPO | Admitting: Family Medicine

## 2020-06-08 VITALS — BP 149/75 | HR 79 | Temp 98.2°F | Resp 16 | Ht 68.0 in | Wt 269.0 lb

## 2020-06-08 DIAGNOSIS — E7800 Pure hypercholesterolemia, unspecified: Secondary | ICD-10-CM

## 2020-06-08 DIAGNOSIS — L97521 Non-pressure chronic ulcer of other part of left foot limited to breakdown of skin: Secondary | ICD-10-CM

## 2020-06-08 DIAGNOSIS — E1142 Type 2 diabetes mellitus with diabetic polyneuropathy: Secondary | ICD-10-CM

## 2020-06-08 DIAGNOSIS — B351 Tinea unguium: Secondary | ICD-10-CM

## 2020-06-08 DIAGNOSIS — I10 Essential (primary) hypertension: Secondary | ICD-10-CM | POA: Diagnosis not present

## 2020-06-08 DIAGNOSIS — M79676 Pain in unspecified toe(s): Secondary | ICD-10-CM

## 2020-06-08 DIAGNOSIS — E668 Other obesity: Secondary | ICD-10-CM

## 2020-06-08 NOTE — Progress Notes (Signed)
She presents today with diabetic peripheral neuropathy and diabetic ulcerations that have gone on to heal uneventfully.  She still has pain and some thickening of the tissue around the hallux bilaterally and subfifth bilaterally.  She also has painfully elongated nails she like to have cut.  Objective: Vital signs stable alert oriented x3 pulses are palpable.  Reactive hyperkeratotic tissue medial aspect of the hallux bilaterally and fifth metatarsal heads bilaterally right greater than left.  No open lesions or wounds are noted.  Toenails are long thick yellow dystrophic with mycotic painful palpation as well as debridement.  Assessment: Pain in limb secondary to diabetic peripheral neuropathy as well as pain Litzinger onychomycosis and chronic diabetic ulcerations hallux bilateral.  Plan: Debridement of all ulcerations no bleeding open wounds today.  Debrided nails 1 through 5 bilaterally.  Follow-up with me in 6 to 8 weeks.

## 2020-06-09 ENCOUNTER — Other Ambulatory Visit: Payer: Self-pay | Admitting: Family Medicine

## 2020-06-09 DIAGNOSIS — J302 Other seasonal allergic rhinitis: Secondary | ICD-10-CM

## 2020-06-09 LAB — CBC WITH DIFFERENTIAL/PLATELET
Basophils Absolute: 0 10*3/uL (ref 0.0–0.2)
Basos: 0 %
EOS (ABSOLUTE): 0.2 10*3/uL (ref 0.0–0.4)
Eos: 2 %
Hematocrit: 36.6 % (ref 34.0–46.6)
Hemoglobin: 12.3 g/dL (ref 11.1–15.9)
Immature Grans (Abs): 0 10*3/uL (ref 0.0–0.1)
Immature Granulocytes: 0 %
Lymphocytes Absolute: 2.1 10*3/uL (ref 0.7–3.1)
Lymphs: 27 %
MCH: 28 pg (ref 26.6–33.0)
MCHC: 33.6 g/dL (ref 31.5–35.7)
MCV: 83 fL (ref 79–97)
Monocytes Absolute: 0.6 10*3/uL (ref 0.1–0.9)
Monocytes: 8 %
Neutrophils Absolute: 4.8 10*3/uL (ref 1.4–7.0)
Neutrophils: 63 %
Platelets: 263 10*3/uL (ref 150–450)
RBC: 4.4 x10E6/uL (ref 3.77–5.28)
RDW: 11.9 % (ref 11.7–15.4)
WBC: 7.6 10*3/uL (ref 3.4–10.8)

## 2020-06-09 LAB — COMPREHENSIVE METABOLIC PANEL
ALT: 24 IU/L (ref 0–32)
AST: 24 IU/L (ref 0–40)
Albumin/Globulin Ratio: 1.4 (ref 1.2–2.2)
Albumin: 4.2 g/dL (ref 3.8–4.8)
Alkaline Phosphatase: 71 IU/L (ref 44–121)
BUN/Creatinine Ratio: 15 (ref 12–28)
BUN: 12 mg/dL (ref 8–27)
Bilirubin Total: 0.6 mg/dL (ref 0.0–1.2)
CO2: 25 mmol/L (ref 20–29)
Calcium: 9.5 mg/dL (ref 8.7–10.3)
Chloride: 101 mmol/L (ref 96–106)
Creatinine, Ser: 0.78 mg/dL (ref 0.57–1.00)
GFR calc Af Amer: 92 mL/min/{1.73_m2} (ref >59)
GFR calc non Af Amer: 79 mL/min/{1.73_m2} (ref >59)
Globulin, Total: 3.1 g/dL (ref 1.5–4.5)
Glucose: 138 mg/dL — ABNORMAL HIGH (ref 65–99)
Potassium: 4.2 mmol/L (ref 3.5–5.2)
Sodium: 138 mmol/L (ref 134–144)
Total Protein: 7.3 g/dL (ref 6.0–8.5)

## 2020-06-09 LAB — TSH: TSH: 2.05 u[IU]/mL (ref 0.450–4.500)

## 2020-06-09 LAB — LIPID PANEL
Chol/HDL Ratio: 3.4 ratio (ref 0.0–4.4)
Cholesterol, Total: 110 mg/dL (ref 100–199)
HDL: 32 mg/dL — ABNORMAL LOW (ref >39)
LDL Chol Calc (NIH): 57 mg/dL (ref 0–99)
Triglycerides: 112 mg/dL (ref 0–149)
VLDL Cholesterol Cal: 21 mg/dL (ref 5–40)

## 2020-06-09 LAB — HEMOGLOBIN A1C
Est. average glucose Bld gHb Est-mCnc: 194 mg/dL
Hgb A1c MFr Bld: 8.4 % — ABNORMAL HIGH (ref 4.8–5.6)

## 2020-06-10 ENCOUNTER — Telehealth: Payer: Self-pay

## 2020-06-10 NOTE — Telephone Encounter (Signed)
There is no note or any information in chart where anyone from this office was trying to contact patient.

## 2020-06-10 NOTE — Telephone Encounter (Signed)
Copied from [place] [id number]. Topic: General - Inquiry >> Jun 10, 2020 12:26 PM Gillis Ends D wrote: Reason for CRM: Patient returned 2 missed calls from the office and would like a return call, she was at the dentist. Please advise

## 2020-06-15 ENCOUNTER — Other Ambulatory Visit: Payer: Self-pay

## 2020-06-15 ENCOUNTER — Encounter: Payer: Self-pay | Admitting: Obstetrics & Gynecology

## 2020-06-15 ENCOUNTER — Ambulatory Visit (INDEPENDENT_AMBULATORY_CARE_PROVIDER_SITE_OTHER): Payer: Medicare PPO | Admitting: Obstetrics & Gynecology

## 2020-06-15 ENCOUNTER — Other Ambulatory Visit (HOSPITAL_COMMUNITY)
Admission: RE | Admit: 2020-06-15 | Discharge: 2020-06-15 | Disposition: A | Discharge status: Home / Self Care | Payer: Medicare PPO | Source: Ambulatory Visit | Attending: Obstetrics & Gynecology | Admitting: Obstetrics & Gynecology

## 2020-06-15 VITALS — BP 140/80 | Ht 68.0 in | Wt 266.0 lb

## 2020-06-15 DIAGNOSIS — N950 Postmenopausal bleeding: Secondary | ICD-10-CM | POA: Diagnosis not present

## 2020-06-15 DIAGNOSIS — Z1231 Encounter for screening mammogram for malignant neoplasm of breast: Secondary | ICD-10-CM

## 2020-06-15 DIAGNOSIS — N814 Uterovaginal prolapse, unspecified: Secondary | ICD-10-CM

## 2020-06-15 NOTE — Progress Notes (Signed)
Postmenopausal Bleeding Patient is a 64 yo G49 AA F who complains of vaginal bleeding. She has been menopausal for several years. Currently on no HRT and has no blood thinners. Bleeding is described as spotting or discharge almost every day since last evaluation in August.  EMB then slight atypia.  She also has sx's of uterin eprolapse which include pressure, urgency, and constipation and has occurred throughout this time period. Other menopausal symptoms include: mild VM sx's.. Workup to date: endometrial biopsy.  She has h/o fibroids as well, many years ago had surgery (myomectomy 1996).  Menstrual History: OB History   No obstetric history on file.    PMHx: She  has a past medical history of Anginal pain ([place]), Asthma, Diabetes mellitus without complication ([place]), GERD (gastroesophageal reflux disease), Hypercholesteremia, Hypertension, Hypokalemia, and Sleep apnea. Also,  has a past surgical history that includes Back surgery (1986); Nasal sinus surgery; Uterine fibroid surgery; and Colonoscopy with propofol (N/A, 07/17/2017)., family history includes Breast cancer (age of onset: 76) in her mother; Breast cancer (age of onset: 55) in her sister; Diabetes in her mother; Heart disease in her father and paternal grandfather; Hypertension in her brother.,  reports that she quit smoking about 6 years ago. Her smoking use included cigarettes. She has never used smokeless tobacco. She reports that she does not drink alcohol and does not use drugs.  She has a current medication list which includes the following prescription(s): acarbose, albuterol, biotin, cadexomer iodine, cetirizine hcl, chlorpheniramine-hydrocodone, fluticasone, fluticasone-salmeterol, glucose blood, homeopathic products, hydrochlorothiazide, hyoscyamine, januvia, jardiance, onetouch ultrasoft, losartan, montelukast, multiple vitamins-minerals, mupirocin ointment, pantoprazole, rosuvastatin, vitamin b-12, and vitamin d3. Also, is allergic  to amoxicillin-pot clavulanate, aspirin, ducodyl [bisacodyl], erythromycin, glipizide, metformin hcl, and macrobid [nitrofurantoin].  Review of Systems  Constitutional: Negative for chills, fever and malaise/fatigue.  HENT: Negative for congestion, sinus pain and sore throat.   Eyes: Negative for blurred vision and pain.  Respiratory: Negative for cough and wheezing.   Cardiovascular: Negative for chest pain and leg swelling.  Gastrointestinal: Negative for abdominal pain, constipation, diarrhea, heartburn, nausea and vomiting.  Genitourinary: Positive for frequency. Negative for dysuria, hematuria and urgency.  Musculoskeletal: Positive for joint pain. Negative for back pain, myalgias and neck pain.  Skin: Negative for itching and rash.  Neurological: Negative for dizziness, tremors and weakness.  Endo/Heme/Allergies: Does not bruise/bleed easily.  Psychiatric/Behavioral: Negative for depression. The patient is not nervous/anxious and does not have insomnia.     Objective: BP 140/80   Ht 5\' 8"  (1.727 m)   Wt 266 lb (120.7 kg)   BMI 40.45 kg/m  Physical Exam Constitutional:      General: She is not in acute distress.    Appearance: She is well-developed.  Genitourinary:     Vagina and uterus normal.     Right Labia: No lesions.    Left Labia: No lesions.    No vaginal erythema or bleeding.     Mild vaginal atrophy present.     Right Adnexa: not tender and no mass present.    Left Adnexa: not tender and no mass present.    No cervical motion tenderness, discharge, polyp or nabothian cyst.     Cervical exam comments: Cervix prolapse to introitus w valsalva or cough >30 degree qtip test No cervical lesions, os small (nullip) .     Uterus is mobile.     Uterus is not enlarged.     No uterine mass detected.    Uterus exam comments: Small  and mobile.     Uterus is midaxial and midaxial.     Pelvic exam was performed with patient supine.  HENT:     Head: Normocephalic and  atraumatic.     Nose: Nose normal.  Abdominal:     General: There is no distension.     Palpations: Abdomen is soft.     Tenderness: There is no abdominal tenderness.  Musculoskeletal:        General: Normal range of motion.  Neurological:     Mental Status: She is alert and oriented to person, place, and time.     Cranial Nerves: No cranial nerve deficit.  Skin:    General: Skin is warm and dry.  Psychiatric:        Attention and Perception: Attention normal.        Mood and Affect: Mood and affect normal.        Speech: Speech normal.        Behavior: Behavior normal.        Thought Content: Thought content normal.        Judgment: Judgment normal.     ASSESSMENT/PLAN:     No problem-specific Assessment & Plan notes found for this encounter.  Problem List Items Addressed This Visit   None   Visit Diagnoses    Uterine prolapse    -  Primary   Encounter for screening mammogram for malignant neoplasm of breast       Relevant Orders   MM 3D SCREEN BREAST BILATERAL   Post-menopausal bleeding        Due to persistence of bleeding as well as sx's related to prolapse, will proceed with surgical planning (TLH BSO).  Options for pessary and exp mgt also discussed. EMB to re-assess for cancer due to bleeding.  Surgery planning would be adjusted if present. Pros and cons of surgery and recovery expectations discussed.  Desires in mid January.  Also encouraged MMG soon.  Pt agrees to comply.  Endometrial Biopsy After discussion with the patient regarding her abnormal uterine bleeding I recommended that she proceed with an endometrial biopsy for further diagnosis. The risks, benefits, alternatives, and indications for an endometrial biopsy were discussed with the patient in detail. She understood the risks including infection, bleeding, cervical laceration and uterine perforation.  Verbal consent was obtained.   PROCEDURE NOTE:  [place] endometrial biopsy was performed using  aseptic technique with iodine preparation.  The uterus was sounded to a length of 8 cm.  Adequate sampling was obtained with minimal blood loss.  The patient tolerated the procedure well.  Disposition will be pending pathology.  Barnett Applebaum, MD, [facility], [facility] 06/15/2020  11:00 AM

## 2020-06-15 NOTE — Patient Instructions (Addendum)
Mammogram needed soon -    Call [phone] to schedule at [facility]  Thank you for choosing [facility]. As part of our ongoing efforts to improve patient experience, we would appreciate your feedback. Please fill out the short survey that you will receive by mail or MyChart. Your opinion is important to [place]! - Dr. Kenton Kingfisher   Total Laparoscopic Hysterectomy A total laparoscopic hysterectomy is a minimally invasive surgery to remove the uterus and cervix. The fallopian tubes and ovaries can also be removed (bilateral salpingo-oophorectomy) during this surgery, if necessary. This procedure may be done to treat problems such as:  Noncancerous growths in the uterus (uterine fibroids) that cause symptoms.  A condition that causes the lining of the uterus (endometrium) to grow in other areas (endometriosis).  Problems with pelvic support. This is caused by weakened muscles of the pelvis following vaginal childbirth or menopause.  Cancer of the cervix, ovaries, uterus, or endometrium.  Excessive (dysfunctional) uterine bleeding. This surgery is performed by inserting a thin, lighted tube (laparoscope) and surgical instruments into small incisions in the abdomen. The laparoscope sends images to a monitor. The images help the health care provider perform the procedure. After this procedure, you will no longer be able to have a baby, and you will no longer have a menstrual period. Tell a health care provider about:  Any allergies you have.  All medicines you are taking, including vitamins, herbs, eye drops, creams, and over-the-counter medicines.  Any problems you or family members have had with anesthetic medicines.  Any blood disorders you have.  Any surgeries you have had.  Any medical conditions you have.  Whether you are pregnant or may be pregnant. What are the risks? Generally, this is a safe procedure. However, problems may occur, including:  Infection.  Bleeding.  Blood  clots in the legs or lungs.  Allergic reactions to medicines.  Damage to other structures or organs.  The risk that the surgery may have to be switched to the regular one in which a large incision is made in the abdomen (abdominal hysterectomy). What happens before the procedure? Staying hydrated Follow instructions from your health care provider about hydration, which may include:  Up to 2 hours before the procedure - you may continue to drink clear liquids, such as water, clear fruit juice, black coffee, and plain tea Eating and drinking restrictions Follow instructions from your health care provider about eating and drinking, which may include:  8 hours before the procedure - stop eating heavy meals or foods such as meat, fried foods, or fatty foods.  6 hours before the procedure - stop eating light meals or foods, such as toast or cereal.  6 hours before the procedure - stop drinking milk or drinks that contain milk.  2 hours before the procedure - stop drinking clear liquids. Medicines  Ask your health care provider about: ? Changing or stopping your regular medicines. This is especially important if you are taking diabetes medicines or blood thinners. ? Taking over-the-counter medicines, vitamins, herbs, and supplements. ? Taking medicines such as aspirin and ibuprofen. These medicines can thin your blood. Do not take these medicines unless your health care provider tells you to take them.  You may be given antibiotic medicine to help prevent infection.  You may be asked to take laxatives.  You may be given medicines to help prevent nausea and vomiting after the procedure. General instructions  Ask your health care provider how your surgical site will be marked or  identified.  You may be asked to shower with a germ-killing soap.  Do not use any products that contain nicotine or tobacco, such as cigarettes and e-cigarettes. If you need help quitting, ask your health care  provider.  You may have an exam or testing, such as an ultrasound to determine the size and shape of your pelvic organs.  You may have a blood or urine sample taken.  This procedure can affect the way you feel about yourself. Talk with your health care provider about the physical and emotional changes hysterectomy may cause.  Plan to have someone take you home from the hospital or clinic.  Plan to have a responsible adult care for you for at least 24 hours after you leave the hospital or clinic. This is important. What happens during the procedure?  To lower your risk of infection: ? Your health care team will wash or sanitize their hands. ? Your skin will be washed with soap. ? Hair may be removed from the surgical area.  An IV will be inserted into one of your veins.  You will be given one or more of the following: ? A medicine to help you relax (sedative). ? A medicine to make you fall asleep (general anesthetic).  You will be given antibiotic medicine through your IV.  A tube may be inserted down your throat to help you breathe during the procedure.  A gas (carbon dioxide) will be used to inflate your abdomen to allow your surgeon to see inside of your abdomen.  Three or four small incisions will be made in your abdomen.  A laparoscope will be inserted into one of your incisions. Surgical instruments will be inserted through the other incisions in order to perform the procedure.  Your uterus and cervix may be removed through your vagina or cut into small pieces and removed through the small incisions. Any other organs that need to be removed will also be removed this way.  Carbon dioxide will be released from inside of your abdomen.  Your incisions will be closed with stitches (sutures).  A bandage (dressing) may be placed over your incisions. The procedure may vary among health care providers and hospitals. What happens after the procedure?  Your blood pressure, heart  rate, breathing rate, and blood oxygen level will be monitored until the medicines you were given have worn off.  You will be given medicine for pain and nausea as needed.  Do not drive for 24 hours if you received a sedative. Summary  Total Laparoscopic hysterectomy is a procedure to remove your uterus, cervix and sometimes the fallopian tubes and ovaries.  This procedure can affect the way you feel about yourself. Talk with your health care provider about the physical and emotional changes hysterectomy may cause.  After this procedure, you will no longer be able to have a baby, and you will no longer have a menstrual period.  You will be given pain medicine to control discomfort after this procedure. This information is not intended to replace advice given to you by your health care provider. Make sure you discuss any questions you have with your health care provider. Document Revised: 05/31/2017 Document Reviewed: 08/29/2016 Elsevier Patient Education  2020 [employer].

## 2020-06-16 LAB — SURGICAL PATHOLOGY

## 2020-06-17 ENCOUNTER — Other Ambulatory Visit: Payer: Self-pay | Admitting: Family Medicine

## 2020-06-17 DIAGNOSIS — E119 Type 2 diabetes mellitus without complications: Secondary | ICD-10-CM

## 2020-06-28 ENCOUNTER — Telehealth: Payer: Self-pay | Admitting: Obstetrics & Gynecology

## 2020-06-28 NOTE — Telephone Encounter (Signed)
Left a message for the patient to return the call.  

## 2020-06-29 ENCOUNTER — Telehealth: Payer: Self-pay | Admitting: Obstetrics & Gynecology

## 2020-06-29 NOTE — Telephone Encounter (Signed)
Pt rtn'd my call to discuss dates for TLH/BSO w [facility]. I offered the following: 1/20, 25, 2/1, 8, 15, 24  She wanted to discuss the dates with her husband and will call me back tomorrow to schedule.

## 2020-06-30 ENCOUNTER — Telehealth: Payer: Self-pay

## 2020-06-30 NOTE — Telephone Encounter (Signed)
Copied from CRM [id number]. Topic: Appointment Scheduling - Scheduling Inquiry for Clinic >> Jun 30, 2020  2:02 PM Karen Stephenson E wrote: Reason for CRM: Pt would like to schedule to get her covid booster/ please advise

## 2020-07-06 ENCOUNTER — Ambulatory Visit (INDEPENDENT_AMBULATORY_CARE_PROVIDER_SITE_OTHER): Payer: Medicare PPO | Admitting: Nurse Practitioner

## 2020-07-06 NOTE — Telephone Encounter (Signed)
Pt  Called office states she would like surgery on 08/02/2020.

## 2020-07-08 ENCOUNTER — Other Ambulatory Visit: Payer: Self-pay | Admitting: Family Medicine

## 2020-07-08 DIAGNOSIS — E7800 Pure hypercholesterolemia, unspecified: Secondary | ICD-10-CM

## 2020-07-08 DIAGNOSIS — J302 Other seasonal allergic rhinitis: Secondary | ICD-10-CM

## 2020-07-11 ENCOUNTER — Encounter: Payer: Self-pay | Admitting: Family Medicine

## 2020-07-12 ENCOUNTER — Telehealth: Payer: Self-pay | Admitting: Obstetrics & Gynecology

## 2020-07-12 NOTE — Telephone Encounter (Signed)
-----   Message from Gae Dry, MD sent at 06/15/2020 11:08 AM EST ----- Regarding: Surgery Surgery Booking Request Patient Full Name:  LATIFA NOBLE  MRN: [id number]  DOB: 12-28-1955  Surgeon: Hoyt Koch, MD  Requested Surgery Date and Time: Mid to late JAN 2022 Primary Diagnosis AND Code:     1. Uterine prolapse  N81.4  2. Post-menopausal bleeding  N95.0  Secondary Diagnosis and Code:  Surgical Procedure: TLH/BSO RNFA Requested?: No L&D Notification: No Admission Status: same day surgery Length of Surgery: 75 min Special Case Needs: No H&P: Yes Phone Interview???:  Yes Interpreter: No Medical Clearance:  Yes  DR GILBERT Special Scheduling Instructions: No Any known health/anesthesia issues, diabetes, sleep apnea, latex allergy, defibrillator/pacemaker?: No Acuity: P3   (P1 highest, P2 delay may cause harm, P3 low, elective gyn, P4 lowest)

## 2020-07-12 NOTE — Telephone Encounter (Signed)
Called patient to schedule TLH/BSO w Kenton Kingfisher  DOS 08/02/20  H&P 1/19 @ 1:30   Covid testing 1/28 @ 8-10:30, [address], drive up and wear mask. Advised pt to quarantine until DOS.  Pre-admit phone call appointment to be requested - date and time will be included on H&P paper work. Also all appointments will be updated on pt MyChart. Explained that this appointment has a call window. Based on the time scheduled will indicate if the call will be received within a 4 hour window before 1:00 or after.  Advised that pt may also receive calls from the hospital pharmacy and pre-service center.  Confirmed pt has [employer] as primary insurance. No secondary insurance.

## 2020-07-13 ENCOUNTER — Encounter: Payer: Self-pay | Admitting: Family Medicine

## 2020-07-13 ENCOUNTER — Telehealth (INDEPENDENT_AMBULATORY_CARE_PROVIDER_SITE_OTHER): Payer: Medicare PPO | Admitting: Family Medicine

## 2020-07-13 ENCOUNTER — Telehealth: Payer: Self-pay | Admitting: Family Medicine

## 2020-07-13 DIAGNOSIS — E119 Type 2 diabetes mellitus without complications: Secondary | ICD-10-CM

## 2020-07-13 DIAGNOSIS — E668 Other obesity: Secondary | ICD-10-CM

## 2020-07-13 DIAGNOSIS — J069 Acute upper respiratory infection, unspecified: Secondary | ICD-10-CM

## 2020-07-13 DIAGNOSIS — R059 Cough, unspecified: Secondary | ICD-10-CM | POA: Diagnosis not present

## 2020-07-13 MED ORDER — DOXYCYCLINE HYCLATE 100 MG PO TABS: 100.0000 mg | ORAL_TABLET | Freq: Two times a day (BID) | ORAL | 0 refills | Status: DC

## 2020-07-13 MED ORDER — HYDROCOD POLST-CPM POLST ER 10-8 MG/5ML PO SUER
5.0000 mL | Freq: Two times a day (BID) | ORAL | 0 refills | Status: DC | PRN
Start: 2020-07-13 — End: 2020-07-14

## 2020-07-13 NOTE — Progress Notes (Signed)
MyChart Video Visit    Virtual Visit via Video Note   This visit type was conducted due to national recommendations for restrictions regarding the COVID-19 Pandemic (e.g. social distancing) in an effort to limit this patient's exposure and mitigate transmission in our community. This patient is at least at moderate risk for complications without adequate follow up. This format is felt to be most appropriate for this patient at this time. Physical exam was limited by quality of the video and audio technology used for the visit.   Patient location: Home Provider location: Office  I discussed the limitations of evaluation and management by telemedicine and the availability of in person appointments. The patient expressed understanding and agreed to proceed.  Patient: Karen Stephenson   DOB: 1956/01/27   65 y.o. Female  MRN: [id number] Visit Date: 07/13/2020  Today's healthcare provider: [name], MD   Chief Complaint  Patient presents with  . URI   Subjective    HPI HPI    URI    URI symptoms: congestion, cough, ear pain, rhinorrhea, shortness of breath, sinus pain, sore throat and wheezing (Patient reports negative home test for COVID done on 07/10/2020)   Onset: Ten days ago   Progression since onset: waxing and waning since onset   Fever: temperature has been with in normal range   Hydration: is drinking plenty of fluids   PMH includes: bronchiectasis   Smoker: not a smoker       Last edited by Dorian Pod, CMA on 07/13/2020  8:29 AM. (History)    Patient had COVID booster shot on December 30 and in 3 to 4 days later developed chest congestion dry cough and some rhinorrhea.  No fever chills hemoptysis headache or myalgias.  No known exposure to COVID.  She lives at home with her husband who is also had his COVID booster.  It is of note is that she is scheduled for uterine prolapse surgery on February 1.  The cough is keeping her up at night and she is  required some Tussionex recently which is helped a lot with sleeping.  She is running out of the Tussionex.  Patient Active Problem List   Diagnosis Date Noted  . Diabetic foot ulcer ([place]) 01/12/2019  . Chronic venous insufficiency 12/01/2018  . Lower extremity ulceration, unspecified laterality, with fat layer exposed ([place]) 12/01/2018  . Diabetes mellitus without complication ([place]) 19/37/9024  . Airway hyperreactivity 12/13/2014  . Acid reflux 12/13/2014  . Hypercholesteremia 12/13/2014  . Decreased potassium in the blood 12/13/2014  . Cramps of lower extremity 12/13/2014  . Extreme obesity 12/13/2014  . Avitaminosis D 12/13/2014  . Dermatophytic onychia 12/24/2013  . Bony exostosis 12/24/2013  . Unstable angina pectoris ([place]) 12/24/2013  . Polyneuropathy 12/24/2013  . Family history of breast cancer 03/01/2008  . Obstructive apnea 12/26/2007  . Diaphragmatic hernia 10/08/2006  . Allergic rhinitis, seasonal 10/08/2006  . Diabetes mellitus, type 2 ([place]) 10/08/2006  . Essential (primary) hypertension 12/29/1999   Social History   Tobacco Use  . Smoking status: Former Smoker    Types: Cigarettes    Quit date: 07/01/2013    Years since quitting: 7.0  . Smokeless tobacco: Never Used  Vaping Use  . Vaping Use: Never used  Substance Use Topics  . Alcohol use: No  . Drug use: No   Allergies  Allergen Reactions  . Amoxicillin-Pot Clavulanate     no further information given.  . Aspirin   .  Ducodyl [Bisacodyl]   . Erythromycin     Other reaction(s): Stomach Ache Stomach pain.  . Glipizide   . Metformin Hcl   . Macrobid [Nitrofurantoin]     Severe diarrhea, severe abdominal pain/cramping      Medications: Outpatient Medications Prior to Visit  Medication Sig  . acarbose (PRECOSE) 25 MG tablet TAKE 1 TABLET BY MOUTH THREE TIMES DAILY WITH MEALS  . albuterol (VENTOLIN HFA) 108 (90 Base) MCG/ACT inhaler Inhale 2 puffs into the lungs every 6 (six) hours as needed for  wheezing or shortness of breath.  Marland Kitchen BIOTIN PO Take 1 tablet by mouth daily.  . Cetirizine HCl 10 MG CAPS Take 1 capsule by mouth daily.   . chlorpheniramine-HYDROcodone (TUSSIONEX PENNKINETIC ER) 10-8 MG/5ML SUER Take 5 mLs by mouth every 12 (twelve) hours as needed for cough.  . fluticasone (FLONASE) 50 MCG/ACT nasal spray Use 2 spray(s) in each nostril once daily  . Fluticasone-Salmeterol (ADVAIR) 250-50 MCG/DOSE AEPB INHALE ONE DOSE BY MOUTH AS NEEDED  . glucose blood test strip Check sugar once daily, E 11.9  . Homeopathic Products (LEG CRAMP RELIEF PO) Take 2 capsules by mouth daily.   . hydrochlorothiazide (HYDRODIURIL) 25 MG tablet Take 1 tablet by mouth once daily  . hyoscyamine (LEVBID) 0.375 MG 12 hr tablet Take 1 tablet (0.375 mg total) by mouth as needed.  Marland Kitchen JANUVIA 100 MG tablet Take 1 tablet by mouth once daily  . JARDIANCE 10 MG TABS tablet Take 1 tablet by mouth once daily  . losartan (COZAAR) 100 MG tablet Take 1 tablet by mouth once daily  . montelukast (SINGULAIR) 10 MG tablet TAKE 1 TABLET BY MOUTH AT BEDTIME  . Multiple Vitamins-Minerals (ZINC PO) Take by mouth.  . mupirocin ointment (BACTROBAN) 2 % Apply to wound after soaking BID  . pantoprazole (PROTONIX) 40 MG tablet Take 1 tablet by mouth once daily  . rosuvastatin (CRESTOR) 20 MG tablet Take 1 tablet by mouth once daily  . vitamin B-12 (CYANOCOBALAMIN) 1000 MCG tablet Take 1,000 mcg by mouth daily.  . Vitamin D, Cholecalciferol, 50 MCG (2000 UT) CAPS Take by mouth daily.  . cadexomer iodine (IODOSORB) 0.9 % gel Apply 1 application topically daily. (Patient not taking: Reported on 07/13/2020)  . Lancets (ONETOUCH ULTRASOFT) lancets Check sugar once daily ,DX E11.9   No facility-administered medications prior to visit.    Review of Systems  Constitutional: Negative for chills and fever.  HENT: Positive for congestion, ear pain, rhinorrhea, sinus pressure, sinus pain and sore throat.   Respiratory: Positive for  cough, chest tightness, shortness of breath and wheezing.   Cardiovascular: Negative for chest pain and palpitations.  Neurological: Negative for headaches.    Last CBC Lab Results  Component Value Date   WBC 7.6 06/08/2020   HGB 12.3 06/08/2020   HCT 36.6 06/08/2020   MCV 83 06/08/2020   MCH 28.0 06/08/2020   RDW 11.9 06/08/2020   PLT 263 06/08/2020      Objective    There were no vitals taken for this visit. BP Readings from Last 3 Encounters:  06/15/20 140/80  06/08/20 (!) 149/75  04/06/20 (!) 146/82   Wt Readings from Last 3 Encounters:  06/15/20 266 lb (120.7 kg)  06/08/20 269 lb (122 kg)  04/06/20 266 lb (120.7 kg)      Physical Exam  Patient was comfortable on the video and audio portion of the exam.  She is alert and oriented.  She is speaking in complete  sentences with no shortness of breath or wheezing.  She has occasional hacking cough.   Assessment & Plan     1. Cough Obtain COVID testing mainly because patient is having surgery in 2 and half weeks. - Novel Coronavirus, NAA ([facility]) - doxycycline (VIBRA-TABS) 100 MG tablet; Take 1 tablet (100 mg total) by mouth 2 (two) times daily.  Dispense: 14 tablet; Refill: 0 - chlorpheniramine-HYDROcodone (TUSSIONEX PENNKINETIC ER) 10-8 MG/5ML SUER; Take 5 mLs by mouth every 12 (twelve) hours as needed for cough.  Dispense: 140 mL; Refill: 0  2. Viral upper respiratory tract infection Most certainly viral but due to upcoming surgery will treat as a chronic bronchitis with doxycycline.  Any chest x-ray if she does not improve over the next week. - Novel Coronavirus, NAA ([facility]) - doxycycline (VIBRA-TABS) 100 MG tablet; Take 1 tablet (100 mg total) by mouth 2 (two) times daily.  Dispense: 14 tablet; Refill: 0  3. Type 2 diabetes mellitus without complication, without long-term current use of insulin ([place])   4. Extreme obesity    No follow-ups on file.     I discussed the assessment and treatment plan with  the patient. The patient was provided an opportunity to ask questions and all were answered. The patient agreed with the plan and demonstrated an understanding of the instructions.   The patient was advised to call back or seek an in-person evaluation if the symptoms worsen or if the condition fails to improve as anticipated.  I provided 12  minutes of non-face-to-face time during this encounter.  I, [name], MD, have reviewed all documentation for this visit. The documentation on 07/13/20 for the exam, diagnosis, procedures, and orders are all accurate and complete.   Shelvy Perazzo Cranford Mon, MD [facility] [phone] (phone) [phone] (fax)  [place]

## 2020-07-13 NOTE — Telephone Encounter (Signed)
Pt called stating that the pharmacy is not able to get stock of the pts Tussione. She states that she would like to have it sent to another pharmacy. Please advise.      [id number] [address], [place] [address]  Phone: [phone] Fax: [phone]  Hours: Not open 24 hours

## 2020-07-14 ENCOUNTER — Other Ambulatory Visit: Payer: Self-pay

## 2020-07-14 ENCOUNTER — Telehealth: Payer: Self-pay

## 2020-07-14 DIAGNOSIS — R059 Cough, unspecified: Secondary | ICD-10-CM

## 2020-07-14 MED ORDER — HYDROCOD POLST-CPM POLST ER 10-8 MG/5ML PO SUER
5.0000 mL | Freq: Two times a day (BID) | ORAL | 0 refills | Status: DC | PRN
Start: 2020-07-14 — End: 2020-07-14

## 2020-07-14 MED ORDER — HYDROCOD POLST-CPM POLST ER 10-8 MG/5ML PO SUER: 5.0000 mL | Freq: Two times a day (BID) | ORAL | 0 refills | Status: DC | PRN

## 2020-07-14 NOTE — Telephone Encounter (Signed)
Copied from [place] [id number]. Topic: General - Other >> Jul 13, 2020  5:16 PM Yvette Rack wrote: Reason for CRM: Pt stated she is at the [facility] but they have not received the Rx for the cough medication. Pt [facility] that [facility].

## 2020-07-14 NOTE — Telephone Encounter (Signed)
Pt called back in to follow up on Rx. Pt says that she spoke with [facility] and was told that they are out of stock. Pt would [facility]. Pt would like to pick up today if possible.    Please assist.

## 2020-07-15 ENCOUNTER — Encounter (INDEPENDENT_AMBULATORY_CARE_PROVIDER_SITE_OTHER): Payer: Self-pay

## 2020-07-15 ENCOUNTER — Ambulatory Visit (INDEPENDENT_AMBULATORY_CARE_PROVIDER_SITE_OTHER): Payer: Medicare PPO | Admitting: Nurse Practitioner

## 2020-07-15 LAB — NOVEL CORONAVIRUS, NAA: SARS-CoV-2, NAA: NOT DETECTED

## 2020-07-15 LAB — SARS-COV-2, NAA 2 DAY TAT

## 2020-07-20 ENCOUNTER — Encounter: Payer: Self-pay | Admitting: Podiatry

## 2020-07-20 ENCOUNTER — Encounter: Payer: Self-pay | Admitting: Family Medicine

## 2020-07-20 ENCOUNTER — Ambulatory Visit: Payer: Medicare PPO | Admitting: Family Medicine

## 2020-07-20 ENCOUNTER — Other Ambulatory Visit: Payer: Self-pay

## 2020-07-20 ENCOUNTER — Encounter: Payer: Self-pay | Admitting: Obstetrics & Gynecology

## 2020-07-20 ENCOUNTER — Ambulatory Visit: Payer: Medicare PPO | Admitting: Podiatry

## 2020-07-20 ENCOUNTER — Ambulatory Visit (INDEPENDENT_AMBULATORY_CARE_PROVIDER_SITE_OTHER): Payer: Medicare PPO | Admitting: Obstetrics & Gynecology

## 2020-07-20 VITALS — BP 132/80 | Ht 68.0 in | Wt 263.0 lb

## 2020-07-20 VITALS — BP 135/70 | HR 82 | Temp 98.1°F | Resp 16 | Ht 68.0 in | Wt 265.0 lb

## 2020-07-20 DIAGNOSIS — G4733 Obstructive sleep apnea (adult) (pediatric): Secondary | ICD-10-CM | POA: Diagnosis not present

## 2020-07-20 DIAGNOSIS — E11621 Type 2 diabetes mellitus with foot ulcer: Secondary | ICD-10-CM

## 2020-07-20 DIAGNOSIS — E1142 Type 2 diabetes mellitus with diabetic polyneuropathy: Secondary | ICD-10-CM | POA: Diagnosis not present

## 2020-07-20 DIAGNOSIS — E668 Other obesity: Secondary | ICD-10-CM | POA: Diagnosis not present

## 2020-07-20 DIAGNOSIS — I10 Essential (primary) hypertension: Secondary | ICD-10-CM

## 2020-07-20 DIAGNOSIS — L97511 Non-pressure chronic ulcer of other part of right foot limited to breakdown of skin: Secondary | ICD-10-CM | POA: Diagnosis not present

## 2020-07-20 DIAGNOSIS — Z01818 Encounter for other preprocedural examination: Secondary | ICD-10-CM | POA: Diagnosis not present

## 2020-07-20 DIAGNOSIS — L97521 Non-pressure chronic ulcer of other part of left foot limited to breakdown of skin: Secondary | ICD-10-CM | POA: Diagnosis not present

## 2020-07-20 DIAGNOSIS — N814 Uterovaginal prolapse, unspecified: Secondary | ICD-10-CM

## 2020-07-20 DIAGNOSIS — N950 Postmenopausal bleeding: Secondary | ICD-10-CM

## 2020-07-20 DIAGNOSIS — E669 Obesity, unspecified: Secondary | ICD-10-CM

## 2020-07-20 NOTE — Progress Notes (Signed)
Established patient visit   Patient: Karen Stephenson   DOB: 04-Jan-1956   65 y.o. Female  MRN: [id number] Visit Date: 07/20/2020  Today's healthcare provider: [name], MD   Chief Complaint  Patient presents with  . Pre-op Exam   Subjective     Subjective:  Karen Stephenson is a 65 y.o. female who presents to the office today for a preoperative consultation at the request of surgeon Teresa Coombs, MD who plans on performing total laparoscopic hysterectomy with bilateral salpingo-ooperectomy on February 1. This consultation is requested for the specific conditions prompting preoperative evaluation: medical clearance. Planned anesthesia: general. The patient has the following known anesthesia issues: family history of problems with anesthesia. Patients bleeding risk: no recent abnormal bleeding. Patient does not have objections to receiving blood products if needed. Patient has no chest pain or dyspnea on exertion.  She has no neurologic symptoms. Patient Active Problem List   Diagnosis Date Noted  . Diabetic foot ulcer ([place]) 01/12/2019  . Chronic venous insufficiency 12/01/2018  . Lower extremity ulceration, unspecified laterality, with fat layer exposed ([place]) 12/01/2018  . Diabetes mellitus without complication ([place]) [id number]  . Airway hyperreactivity 12/13/2014  . Acid reflux 12/13/2014  . Hypercholesteremia 12/13/2014  . Decreased potassium in the blood 12/13/2014  . Cramps of lower extremity 12/13/2014  . Extreme obesity 12/13/2014  . Avitaminosis D 12/13/2014  . Dermatophytic onychia 12/24/2013  . Bony exostosis 12/24/2013  . Unstable angina pectoris ([place]) 12/24/2013  . Polyneuropathy 12/24/2013  . Family history of breast cancer 03/01/2008  . Obstructive apnea 12/26/2007  . Diaphragmatic hernia 10/08/2006  . Allergic rhinitis, seasonal 10/08/2006  . Diabetes mellitus, type 2 ([place]) 10/08/2006  . Essential (primary) hypertension 12/29/1999   Past Surgical  History:  Procedure Laterality Date  . BACK SURGERY  1986  . COLONOSCOPY WITH PROPOFOL N/A 07/17/2017   Procedure: COLONOSCOPY WITH PROPOFOL;  Surgeon: Manya Silvas, MD;  Location: [facility] ENDOSCOPY;  Service: Endoscopy;  Laterality: N/A;  . NASAL SINUS SURGERY    . UTERINE FIBROID SURGERY     Social History   Socioeconomic History  . Marital status: Married    Spouse name: Not on file  . Number of children: Not on file  . Years of education: Not on file  . Highest education level: Not on file  Occupational History  . Not on file  Tobacco Use  . Smoking status: Former Smoker    Types: Cigarettes    Quit date: 07/01/2013    Years since quitting: 7.0  . Smokeless tobacco: Never Used  Vaping Use  . Vaping Use: Never used  Substance and Sexual Activity  . Alcohol use: No  . Drug use: No  . Sexual activity: Not on file  Other Topics Concern  . Not on file  Social History Narrative  . Not on file   Social Determinants of Health   Financial Resource Strain: Not on file  Food Insecurity: Not on file  Transportation Needs: Not on file  Physical Activity: Not on file  Stress: Not on file  Social Connections: Not on file  Intimate Partner Violence: Not on file   Family History  Problem Relation Age of Onset  . Diabetes Mother   . Breast cancer Mother 27  . Heart disease Father   . Breast cancer Sister 54       dx twice   . Heart disease Paternal Grandfather   . Hypertension Brother   .  Colon cancer Neg Hx   . Ovarian cancer Neg Hx    Allergies  Allergen Reactions  . Amoxicillin-Pot Clavulanate     no further information given.  . Aspirin     Unknown dosage - passed out   . Ducodyl [Bisacodyl]     Passed out, extreme cramping   . Erythromycin     Other reaction(s): Stomach Ache Stomach pain.  . Glipizide     Extreme stomach aches   . Metformin Hcl     Extreme stomach cramps   . Macrobid [Nitrofurantoin]     Severe diarrhea, severe abdominal pain/cramping        Medications: Outpatient Medications Prior to Visit  Medication Sig  . acarbose (PRECOSE) 25 MG tablet TAKE 1 TABLET BY MOUTH THREE TIMES DAILY WITH MEALS (Patient taking differently: Take 25 mg by mouth 3 (three) times daily with meals.)  . albuterol (VENTOLIN HFA) 108 (90 Base) MCG/ACT inhaler Inhale 2 puffs into the lungs every 6 (six) hours as needed for wheezing or shortness of breath.  Marland Kitchen BIOTIN PO Take 1 tablet by mouth daily.  . cadexomer iodine (IODOSORB) 0.9 % gel Apply 1 application topically daily.  . cetirizine (ZYRTEC) 10 MG tablet Take 10 mg by mouth daily.  . chlorpheniramine-HYDROcodone (TUSSIONEX PENNKINETIC ER) 10-8 MG/5ML SUER Take 5 mLs by mouth every 12 (twelve) hours as needed for cough. (Patient not taking: Reported on 07/20/2020)  . doxycycline (VIBRA-TABS) 100 MG tablet Take 1 tablet (100 mg total) by mouth 2 (two) times daily. (Patient not taking: No sig reported)  . fluticasone (FLONASE) 50 MCG/ACT nasal spray Use 2 spray(s) in each nostril once daily (Patient taking differently: Place 2 sprays into both nostrils daily.)  . Fluticasone-Salmeterol (ADVAIR) 250-50 MCG/DOSE AEPB INHALE ONE DOSE BY MOUTH AS NEEDED (Patient taking differently: Inhale 1 puff into the lungs daily as needed (asthma).)  . glucose blood test strip Check sugar once daily, E 11.9  . Homeopathic Products (LEG CRAMP RELIEF PO) Take 2 capsules by mouth daily.   . hydrochlorothiazide (HYDRODIURIL) 25 MG tablet Take 1 tablet by mouth once daily (Patient taking differently: Take 25 mg by mouth daily.)  . hyoscyamine (LEVBID) 0.375 MG 12 hr tablet Take 1 tablet (0.375 mg total) by mouth as needed. (Patient taking differently: Take 0.375 mg by mouth every 12 (twelve) hours as needed for cramping.)  . JANUVIA 100 MG tablet Take 1 tablet by mouth once daily (Patient taking differently: Take 100 mg by mouth daily.)  . JARDIANCE 10 MG TABS tablet Take 1 tablet by mouth once daily  . Lancets (ONETOUCH  ULTRASOFT) lancets Check sugar once daily ,DX E11.9  . losartan (COZAAR) 100 MG tablet Take 1 tablet by mouth once daily (Patient taking differently: Take 100 mg by mouth daily.)  . montelukast (SINGULAIR) 10 MG tablet TAKE 1 TABLET BY MOUTH AT BEDTIME (Patient taking differently: Take 10 mg by mouth at bedtime.)  . pantoprazole (PROTONIX) 40 MG tablet Take 1 tablet by mouth once daily (Patient taking differently: Take 40 mg by mouth daily.)  . rosuvastatin (CRESTOR) 20 MG tablet Take 1 tablet by mouth once daily (Patient taking differently: Take 20 mg by mouth daily.)  . vitamin B-12 (CYANOCOBALAMIN) 1000 MCG tablet Take 1,000 mcg by mouth daily.  . Vitamin D, Cholecalciferol, 50 MCG (2000 UT) CAPS Take 2,000 Units by mouth daily.  Marland Kitchen zinc gluconate 50 MG tablet Take 50 mg by mouth daily.   No facility-administered medications prior to visit.  Review of Systems  Constitutional: Negative.   HENT: Negative.   Eyes: Negative.   Respiratory: Negative.   Cardiovascular: Negative.   Gastrointestinal: Negative.   Endocrine: Negative.   Genitourinary: Negative.   Musculoskeletal: Negative.   Skin: Negative.   Allergic/Immunologic: Negative.   Neurological: Negative.   Hematological: Negative.   Psychiatric/Behavioral: Negative.     Last CBC Lab Results  Component Value Date   WBC 7.6 06/08/2020   HGB 12.3 06/08/2020   HCT 36.6 06/08/2020   MCV 83 06/08/2020   MCH 28.0 06/08/2020   RDW 11.9 06/08/2020   PLT 263 [id number]   Last metabolic panel Lab Results  Component Value Date   GLUCOSE 138 (H) 06/08/2020   NA 138 06/08/2020   K 4.2 06/08/2020   CL 101 06/08/2020   CO2 25 06/08/2020   BUN 12 06/08/2020   CREATININE 0.78 06/08/2020   GFRNONAA 79 06/08/2020   GFRAA 92 06/08/2020   CALCIUM 9.5 06/08/2020   PROT 7.3 06/08/2020   ALBUMIN 4.2 06/08/2020   LABGLOB 3.1 06/08/2020   AGRATIO 1.4 06/08/2020   BILITOT 0.6 06/08/2020   ALKPHOS 71 06/08/2020   AST 24 06/08/2020    ALT 24 06/08/2020   Last lipids Lab Results  Component Value Date   CHOL 110 06/08/2020   HDL 32 (L) 06/08/2020   LDLCALC 57 06/08/2020   TRIG 112 06/08/2020   CHOLHDL 3.4 06/08/2020   Last hemoglobin A1c Lab Results  Component Value Date   HGBA1C 8.4 (H) 06/08/2020   Last thyroid functions Lab Results  Component Value Date   TSH 2.050 06/08/2020       Objective    BP 135/70 (BP Location: Right Arm, Patient Position: Sitting, Cuff Size: Large)   Pulse 82   Temp 98.1 F (36.7 C) (Oral)   Resp 16   Ht 5\' 8"  (1.727 m)   Wt 265 lb (120.2 kg)   SpO2 100%   BMI 40.29 kg/m  BP Readings from Last 3 Encounters:  07/20/20 135/70  07/20/20 132/80  06/15/20 140/80   Wt Readings from Last 3 Encounters:  07/20/20 265 lb (120.2 kg)  07/20/20 263 lb (119.3 kg)  06/15/20 266 lb (120.7 kg)       Physical Exam Vitals and nursing note reviewed.  Constitutional:      Appearance: She is well-developed. She is obese.  HENT:     Head: Normocephalic and atraumatic.     Right Ear: External ear normal.     Left Ear: External ear normal.  Eyes:     General: No scleral icterus.    Conjunctiva/sclera: Conjunctivae normal.  Neck:     Thyroid: No thyromegaly.     Vascular: No carotid bruit.  Cardiovascular:     Rate and Rhythm: Normal rate and regular rhythm.     Pulses: Normal pulses.     Heart sounds: Normal heart sounds.  Pulmonary:     Effort: Pulmonary effort is normal.     Breath sounds: Normal breath sounds.  Chest:  Breasts:     Right: No swelling, bleeding, inverted nipple, mass, nipple discharge, skin change or tenderness.     Left: No swelling, bleeding, inverted nipple, mass, nipple discharge, skin change or tenderness.    Abdominal:     Palpations: Abdomen is soft.  Musculoskeletal:     Right lower leg: No edema.     Left lower leg: No edema.  Lymphadenopathy:     Cervical: No cervical adenopathy.  Skin:  General: Skin is warm and dry.   Neurological:     General: No focal deficit present.     Mental Status: She is alert and oriented to person, place, and time.  Psychiatric:        Mood and Affect: Mood normal.        Behavior: Behavior normal.        Thought Content: Thought content normal.        Judgment: Judgment normal.       No results found for any visits on 07/20/20.  Assessment & Plan     1. Pre-op examination She is asymptomatic on review of systems but has T wave inversions and risk factors for vascular disease.  Refer to cardiology for cardiac clearance for surgery. - EKG 12-Lead - Ambulatory referral to Cardiology  2. Essential (primary) hypertension   3. Obstructive apnea   4. Diabetic ulcer of right foot associated with type 2 diabetes mellitus, limited to breakdown of skin, unspecified part of foot ([place])   5. Type 2 diabetes mellitus with diabetic polyneuropathy, without long-term current use of insulin ([place])   6. Extreme obesity    No follow-ups on file.      I, [name], MD, have reviewed all documentation for this visit. The documentation on 07/24/20 for the exam, diagnosis, procedures, and orders are all accurate and complete.    Kobie Whidby Cranford Mon, MD  [facility] [phone] (phone) [phone] (fax)  [place]

## 2020-07-20 NOTE — Progress Notes (Signed)
She presents today for follow-up of her ulcers hallux bilaterally.  She states that they are doing better seems to be ever improving.  States her blood sugars doing okay right now she is scheduled for a lap assisted vaginal hysterectomy February 1 by Dr. Barnett Applebaum.  Objective: Vital signs are stable she is alert and oriented x3 pulses are palpable.  Reactive hyperkeratosis to the plantar subfifth bilaterally and the medial aspect of the hallux bilateral left is completely close no open lesions or wounds at this point.  Assessment: Well-healing diabetic ulcerations hallux bilateral and subfifth bilateral.  Plan: Debridement of all reactive hyperkeratotic tissue debridement of all diabetic ulcerations.  Follow-up with her in 1 month nail debridement and callus debridement.  Remember to ask how her surgery with Dr. Kenton Kingfisher went.

## 2020-07-20 NOTE — Progress Notes (Signed)
PRE-OPERATIVE HISTORY AND PHYSICAL EXAM  HPI:  Karen Stephenson is a 64 y.o. G0P0000 No LMP recorded. Patient is postmenopausal.; she is being admitted for surgery related to abnormal uterine bleeding and pelvic relaxation.  Bleeding is described as spotting or discharge almost every day since last evaluation in August.  EMB then slight atypia.  She also has sx's of uterin eprolapse which include pressure, urgency, and constipation and has occurred throughout this time period.  PAP and EMB normal.  PMHx: Past Medical History:  Diagnosis Date  . Anginal pain ([place])   . Asthma   . Diabetes mellitus without complication ([place])   . GERD (gastroesophageal reflux disease)   . Hypercholesteremia   . Hypertension   . Hypokalemia   . Sleep apnea    Past Surgical History:  Procedure Laterality Date  . BACK SURGERY  1986  . COLONOSCOPY WITH PROPOFOL N/A 07/17/2017   Procedure: COLONOSCOPY WITH PROPOFOL;  Surgeon: Manya Silvas, MD;  Location: [facility] ENDOSCOPY;  Service: Endoscopy;  Laterality: N/A;  . NASAL SINUS SURGERY    . UTERINE FIBROID SURGERY     Family History  Problem Relation Age of Onset  . Diabetes Mother   . Breast cancer Mother 21  . Heart disease Father   . Breast cancer Sister 76       dx twice   . Heart disease Paternal Grandfather   . Hypertension Brother   . Colon cancer Neg Hx   . Ovarian cancer Neg Hx    Social History   Tobacco Use  . Smoking status: Former Smoker    Types: Cigarettes    Quit date: 07/01/2013    Years since quitting: 7.0  . Smokeless tobacco: Never Used  Vaping Use  . Vaping Use: Never used  Substance Use Topics  . Alcohol use: No  . Drug use: No    Current Outpatient Medications:  .  acarbose (PRECOSE) 25 MG tablet, TAKE 1 TABLET BY MOUTH THREE TIMES DAILY WITH MEALS (Patient taking differently: Take 25 mg by mouth 3 (three) times daily with meals.), Disp: 270 tablet, Rfl: 0 .  albuterol (VENTOLIN HFA) 108 (90 Base) MCG/ACT inhaler,  Inhale 2 puffs into the lungs every 6 (six) hours as needed for wheezing or shortness of breath., Disp: 8 g, Rfl: 0 .  BIOTIN PO, Take 1 tablet by mouth daily., Disp: , Rfl:  .  cadexomer iodine (IODOSORB) 0.9 % gel, Apply 1 application topically daily., Disp: 40 g, Rfl: 3 .  cetirizine (ZYRTEC) 10 MG tablet, Take 10 mg by mouth daily., Disp: , Rfl:  .  fluticasone (FLONASE) 50 MCG/ACT nasal spray, Use 2 spray(s) in each nostril once daily (Patient taking differently: Place 2 sprays into both nostrils daily.), Disp: 16 g, Rfl: 0 .  Fluticasone-Salmeterol (ADVAIR) 250-50 MCG/DOSE AEPB, INHALE ONE DOSE BY MOUTH AS NEEDED (Patient taking differently: Inhale 1 puff into the lungs daily as needed (asthma).), Disp: 60 each, Rfl: 12 .  glucose blood test strip, Check sugar once daily, E 11.9, Disp: 25 each, Rfl: 12 .  Homeopathic Products (LEG CRAMP RELIEF PO), Take 2 capsules by mouth daily. , Disp: , Rfl:  .  hydrochlorothiazide (HYDRODIURIL) 25 MG tablet, Take 1 tablet by mouth once daily (Patient taking differently: Take 25 mg by mouth daily.), Disp: 90 tablet, Rfl: 0 .  hyoscyamine (LEVBID) 0.375 MG 12 hr tablet, Take 1 tablet (0.375 mg total) by mouth as needed. (Patient taking differently: Take 0.375  mg by mouth every 12 (twelve) hours as needed for cramping.), Disp: 60 tablet, Rfl: 5 .  JANUVIA 100 MG tablet, Take 1 tablet by mouth once daily (Patient taking differently: Take 100 mg by mouth daily.), Disp: 90 tablet, Rfl: 1 .  JARDIANCE 10 MG TABS tablet, Take 1 tablet by mouth once daily, Disp: 90 tablet, Rfl: 0 .  Lancets (ONETOUCH ULTRASOFT) lancets, Check sugar once daily ,DX E11.9, Disp: 30 each, Rfl: 12 .  losartan (COZAAR) 100 MG tablet, Take 1 tablet by mouth once daily (Patient taking differently: Take 100 mg by mouth daily.), Disp: 90 tablet, Rfl: 0 .  montelukast (SINGULAIR) 10 MG tablet, TAKE 1 TABLET BY MOUTH AT BEDTIME (Patient taking differently: Take 10 mg by mouth at bedtime.), Disp:  90 tablet, Rfl: 0 .  pantoprazole (PROTONIX) 40 MG tablet, Take 1 tablet by mouth once daily (Patient taking differently: Take 40 mg by mouth daily.), Disp: 90 tablet, Rfl: 3 .  rosuvastatin (CRESTOR) 20 MG tablet, Take 1 tablet by mouth once daily (Patient taking differently: Take 20 mg by mouth daily.), Disp: 90 tablet, Rfl: 0 .  vitamin B-12 (CYANOCOBALAMIN) 1000 MCG tablet, Take 1,000 mcg by mouth daily., Disp: , Rfl:  .  Vitamin D, Cholecalciferol, 50 MCG (2000 UT) CAPS, Take 2,000 Units by mouth daily., Disp: , Rfl:  .  zinc gluconate 50 MG tablet, Take 50 mg by mouth daily., Disp: , Rfl:  .  chlorpheniramine-HYDROcodone (TUSSIONEX PENNKINETIC ER) 10-8 MG/5ML SUER, Take 5 mLs by mouth every 12 (twelve) hours as needed for cough. (Patient not taking: Reported on 07/20/2020), Disp: 140 mL, Rfl: 0 .  doxycycline (VIBRA-TABS) 100 MG tablet, Take 1 tablet (100 mg total) by mouth 2 (two) times daily. (Patient not taking: No sig reported), Disp: 14 tablet, Rfl: 0 Allergies: Amoxicillin-pot clavulanate, Aspirin, Ducodyl [bisacodyl], Erythromycin, Glipizide, Metformin hcl, and Macrobid [nitrofurantoin]  Review of Systems  Constitutional: Negative for chills, fever and malaise/fatigue.  HENT: Negative for congestion, sinus pain and sore throat.   Eyes: Negative for blurred vision and pain.  Respiratory: Negative for cough and wheezing.   Cardiovascular: Negative for chest pain and leg swelling.  Gastrointestinal: Negative for abdominal pain, constipation, diarrhea, heartburn, nausea and vomiting.  Genitourinary: Negative for dysuria, frequency, hematuria and urgency.  Musculoskeletal: Negative for back pain, joint pain, myalgias and neck pain.  Skin: Negative for itching and rash.  Neurological: Negative for dizziness, tremors and weakness.  Endo/Heme/Allergies: Does not bruise/bleed easily.  Psychiatric/Behavioral: Negative for depression. The patient is not nervous/anxious and does not have  insomnia.     Objective: BP 132/80   Ht 5\' 8"  (1.727 m)   Wt 263 lb (119.3 kg)   BMI 39.99 kg/m   Filed Weights   07/20/20 1328  Weight: 263 lb (119.3 kg)   Physical Exam Constitutional:      General: She is not in acute distress.    Appearance: She is well-developed.  HENT:     Head: Normocephalic and atraumatic. No laceration.     Right Ear: Hearing normal.     Left Ear: Hearing normal.     Mouth/Throat:     Pharynx: Uvula midline.  Eyes:     Pupils: Pupils are equal, round, and reactive to light.  Neck:     Thyroid: No thyromegaly.  Cardiovascular:     Rate and Rhythm: Normal rate and regular rhythm.     Heart sounds: No murmur heard. No friction rub. No gallop.  Pulmonary:     Effort: Pulmonary effort is normal. No respiratory distress.     Breath sounds: Normal breath sounds. No wheezing.  Abdominal:     General: Bowel sounds are normal. There is no distension.     Palpations: Abdomen is soft.     Tenderness: There is no abdominal tenderness. There is no rebound.  Musculoskeletal:        General: Normal range of motion.     Cervical back: Normal range of motion and neck supple.  Neurological:     Mental Status: She is alert and oriented to person, place, and time.     Cranial Nerves: No cranial nerve deficit.  Skin:    General: Skin is warm and dry.  Psychiatric:        Judgment: Judgment normal.  Vitals reviewed.     Assessment: 1. Uterine prolapse   2. Post-menopausal bleeding   Plan for [facility] BSO CYSTOSCOPY  I have had a careful discussion with this patient about all the options available and the risk/benefits of each. I have fully informed this patient that surgery may subject her to a variety of discomforts and risks: She understands that most patients have surgery with little difficulty, but problems can happen ranging from minor to fatal. These include nausea, vomiting, pain, bleeding, infection, poor healing, hernia, or formation of adhesions.  Unexpected reactions may occur from any drug or anesthetic given. Unintended injury may occur to other pelvic or abdominal structures such as Fallopian tubes, ovaries, bladder, ureter (tube from kidney to bladder), or bowel. Nerves going from the pelvis to the legs may be injured. Any such injury may require immediate or later additional surgery to correct the problem. Excessive blood loss requiring transfusion is very unlikely but possible. Dangerous blood clots may form in the legs or lungs. Physical and sexual activity will be restricted in varying degrees for an indeterminate period of time but most often 2-6 weeks.  Finally, she understands that it is impossible to list every possible undesirable effect and that the condition for which surgery is done is not always cured or significantly improved, and in rare cases may be even worse.Ample time was given to answer all questions.  Barnett Applebaum, MD, [facility], [facility] 07/20/2020  1:50 PM

## 2020-07-20 NOTE — Patient Instructions (Addendum)
PRE ADMISSION TESTING For Covid, prior to procedure Friday 1/28 9:00-10:00 [facility] entrance (drive up)  Results in 48-72 hours You will not receive notification if test results are negative. If positive for Covid19, your provider will notify you by phone, with additional instructions.  Total Laparoscopic Hysterectomy, Care After The following information offers guidance on how to care for yourself after your procedure. Your health care provider may also give you more specific instructions. If you have problems or questions, contact your health care provider. What can I expect after the procedure? After the procedure, it is common to have:  Pain, bruising, and numbness around your incisions.  Tiredness (fatigue).  Poor appetite.  Less interest in sex.  Vaginal discharge or bleeding. You will need to use a sanitary pad after this procedure.  Feelings of sadness or other emotions. If your ovaries were also removed, it is also common to have symptoms of menopause, such as hot flashes, night sweats, and lack of sleep (insomnia). Follow these instructions at home: Medicines  Take over-the-counter and prescription medicines only as told by your health care provider.  Ask your health care provider if the medicine prescribed to you: ? Requires you to avoid driving or using machinery. ? Can cause constipation. You may need to take these actions to prevent or treat constipation:  Drink enough fluid to keep your urine pale yellow.  Take over-the-counter or prescription medicines.  Eat foods that are high in fiber, such as beans, whole grains, and fresh fruits and vegetables.  Limit foods that are high in fat and processed sugars, such as fried or sweet foods. Incision care  Follow instructions from your health care provider about how to take care of your incisions. Make sure you: ? Wash your hands with soap and water for at least 20 seconds before and after you change  your bandage (dressing). If soap and water are not available, use hand sanitizer. ? Change your dressing as told by your health care provider. ? Leave stitches (sutures), skin glue, or adhesive strips in place. These skin closures may need to stay in place for 2 weeks or longer. If adhesive strip edges start to loosen and curl up, you may trim the loose edges. Do not remove adhesive strips completely unless your health care provider tells you to do that.  Check your incision areas every day for signs of infection. Check for: ? More redness, swelling, or pain. ? Fluid or blood. ? Warmth. ? Pus or a bad smell.   Activity  Rest as told by your health care provider.  Avoid sitting for a long time without moving. Get up to take short walks every 1-2 hours. This is important to improve blood flow and breathing. Ask for help if you feel weak or unsteady.  Return to your normal activities as told by your health care provider. Ask your health care provider what activities are safe for you.  Do not lift anything that is heavier than 10 lb (4.5 kg), or the limit that you are told, for one month after surgery or until your health care provider says that it is safe.  If you were given a sedative during the procedure, it can affect you for several hours. Do not drive or operate machinery until your health care provider says that it is safe.   Lifestyle  Do not use any products that contain nicotine or tobacco. These products include cigarettes, chewing tobacco, and vaping devices, such as e-cigarettes. These can  delay healing after surgery. If you need help quitting, ask your health care provider.  Do not drink alcohol until your health care provider approves. General instructions  Do not douche, use tampons, or have sex for at least 6 weeks, or as told by your health care provider.  If you struggle with physical or emotional changes after your procedure, speak with your health care provider or a  therapist.  Do not take baths, swim, or use a hot tub until your health care provider approves. You may only be allowed to take showers for 2-3 weeks.  Keep your dressing dry until your health care provider says it can be removed.  Try to have someone at home with you for the first 1-2 weeks to help with your daily chores.  Wear compression stockings as told by your health care provider. These stockings help to prevent blood clots and reduce swelling in your legs.  Keep all follow-up visits. This is important.   Contact a health care provider if:  You have any of these signs of infection: ? Chills or a fever. ? More redness, swelling, or pain around an incision. ? Fluid or blood coming from an incision. ? Warmth coming from an incision. ? Pus or a bad smell coming from an incision.  An incision opens.  You feel dizzy or light-headed.  You have pain or bleeding when you urinate, or you are unable to urinate.  You have abnormal vaginal discharge.  You have pain that does not get better with medicine. Get help right away if:  You have a fever and your symptoms suddenly get worse.  You have severe abdominal pain.  You have chest pain or shortness of breath.  You faint.  You have pain, swelling, or redness in your leg.  You have heavy vaginal bleeding with blood clots, soaking through a sanitary pad in less than 1 hour. These symptoms may represent a serious problem that is an emergency. Do not wait to see if the symptoms will go away. Get medical help right away. Call your local emergency services (911 in the [place] Do not drive yourself to the hospital. Summary  After the procedure, it is common to have pain and bruising around your incisions.  Do not take baths, swim, or use a hot tub until your health care provider approves.  Do not lift anything that is heavier than 10 lb (4.5 kg), or the limit that you are told, for one month after surgery or until your health care  provider says that it is safe.  Tell your health care provider if you have any signs or symptoms of infection after the procedure.  Get help right away if you have severe abdominal pain, chest pain, shortness of breath, or heavy bleeding from your vagina. This information is not intended to replace advice given to you by your health care provider. Make sure you discuss any questions you have with your health care provider. Document Revised: 02/19/2020 Document Reviewed: 02/19/2020 Elsevier Patient Education  2021 [employer] Inc[employer]  

## 2020-07-20 NOTE — H&P (View-Only) (Signed)
PRE-OPERATIVE HISTORY AND PHYSICAL EXAM  HPI:  Karen Stephenson is a 64 y.o. G0P0000 No LMP recorded. Patient is postmenopausal.; she is being admitted for surgery related to abnormal uterine bleeding and pelvic relaxation.  Bleeding is described as spotting or discharge almost every day since last evaluation in August.  EMB then slight atypia.  She also has sx's of uterin eprolapse which include pressure, urgency, and constipation and has occurred throughout this time period.  PAP and EMB normal.  PMHx: Past Medical History:  Diagnosis Date  . Anginal pain ([place])   . Asthma   . Diabetes mellitus without complication ([place])   . GERD (gastroesophageal reflux disease)   . Hypercholesteremia   . Hypertension   . Hypokalemia   . Sleep apnea    Past Surgical History:  Procedure Laterality Date  . BACK SURGERY  1986  . COLONOSCOPY WITH PROPOFOL N/A 07/17/2017   Procedure: COLONOSCOPY WITH PROPOFOL;  Surgeon: Manya Silvas, MD;  Location: [facility] ENDOSCOPY;  Service: Endoscopy;  Laterality: N/A;  . NASAL SINUS SURGERY    . UTERINE FIBROID SURGERY     Family History  Problem Relation Age of Onset  . Diabetes Mother   . Breast cancer Mother 21  . Heart disease Father   . Breast cancer Sister 76       dx twice   . Heart disease Paternal Grandfather   . Hypertension Brother   . Colon cancer Neg Hx   . Ovarian cancer Neg Hx    Social History   Tobacco Use  . Smoking status: Former Smoker    Types: Cigarettes    Quit date: 07/01/2013    Years since quitting: 7.0  . Smokeless tobacco: Never Used  Vaping Use  . Vaping Use: Never used  Substance Use Topics  . Alcohol use: No  . Drug use: No    Current Outpatient Medications:  .  acarbose (PRECOSE) 25 MG tablet, TAKE 1 TABLET BY MOUTH THREE TIMES DAILY WITH MEALS (Patient taking differently: Take 25 mg by mouth 3 (three) times daily with meals.), Disp: 270 tablet, Rfl: 0 .  albuterol (VENTOLIN HFA) 108 (90 Base) MCG/ACT inhaler,  Inhale 2 puffs into the lungs every 6 (six) hours as needed for wheezing or shortness of breath., Disp: 8 g, Rfl: 0 .  BIOTIN PO, Take 1 tablet by mouth daily., Disp: , Rfl:  .  cadexomer iodine (IODOSORB) 0.9 % gel, Apply 1 application topically daily., Disp: 40 g, Rfl: 3 .  cetirizine (ZYRTEC) 10 MG tablet, Take 10 mg by mouth daily., Disp: , Rfl:  .  fluticasone (FLONASE) 50 MCG/ACT nasal spray, Use 2 spray(s) in each nostril once daily (Patient taking differently: Place 2 sprays into both nostrils daily.), Disp: 16 g, Rfl: 0 .  Fluticasone-Salmeterol (ADVAIR) 250-50 MCG/DOSE AEPB, INHALE ONE DOSE BY MOUTH AS NEEDED (Patient taking differently: Inhale 1 puff into the lungs daily as needed (asthma).), Disp: 60 each, Rfl: 12 .  glucose blood test strip, Check sugar once daily, E 11.9, Disp: 25 each, Rfl: 12 .  Homeopathic Products (LEG CRAMP RELIEF PO), Take 2 capsules by mouth daily. , Disp: , Rfl:  .  hydrochlorothiazide (HYDRODIURIL) 25 MG tablet, Take 1 tablet by mouth once daily (Patient taking differently: Take 25 mg by mouth daily.), Disp: 90 tablet, Rfl: 0 .  hyoscyamine (LEVBID) 0.375 MG 12 hr tablet, Take 1 tablet (0.375 mg total) by mouth as needed. (Patient taking differently: Take 0.375  mg by mouth every 12 (twelve) hours as needed for cramping.), Disp: 60 tablet, Rfl: 5 .  JANUVIA 100 MG tablet, Take 1 tablet by mouth once daily (Patient taking differently: Take 100 mg by mouth daily.), Disp: 90 tablet, Rfl: 1 .  JARDIANCE 10 MG TABS tablet, Take 1 tablet by mouth once daily, Disp: 90 tablet, Rfl: 0 .  Lancets (ONETOUCH ULTRASOFT) lancets, Check sugar once daily ,DX E11.9, Disp: 30 each, Rfl: 12 .  losartan (COZAAR) 100 MG tablet, Take 1 tablet by mouth once daily (Patient taking differently: Take 100 mg by mouth daily.), Disp: 90 tablet, Rfl: 0 .  montelukast (SINGULAIR) 10 MG tablet, TAKE 1 TABLET BY MOUTH AT BEDTIME (Patient taking differently: Take 10 mg by mouth at bedtime.), Disp:  90 tablet, Rfl: 0 .  pantoprazole (PROTONIX) 40 MG tablet, Take 1 tablet by mouth once daily (Patient taking differently: Take 40 mg by mouth daily.), Disp: 90 tablet, Rfl: 3 .  rosuvastatin (CRESTOR) 20 MG tablet, Take 1 tablet by mouth once daily (Patient taking differently: Take 20 mg by mouth daily.), Disp: 90 tablet, Rfl: 0 .  vitamin B-12 (CYANOCOBALAMIN) 1000 MCG tablet, Take 1,000 mcg by mouth daily., Disp: , Rfl:  .  Vitamin D, Cholecalciferol, 50 MCG (2000 UT) CAPS, Take 2,000 Units by mouth daily., Disp: , Rfl:  .  zinc gluconate 50 MG tablet, Take 50 mg by mouth daily., Disp: , Rfl:  .  chlorpheniramine-HYDROcodone (TUSSIONEX PENNKINETIC ER) 10-8 MG/5ML SUER, Take 5 mLs by mouth every 12 (twelve) hours as needed for cough. (Patient not taking: Reported on 07/20/2020), Disp: 140 mL, Rfl: 0 .  doxycycline (VIBRA-TABS) 100 MG tablet, Take 1 tablet (100 mg total) by mouth 2 (two) times daily. (Patient not taking: No sig reported), Disp: 14 tablet, Rfl: 0 Allergies: Amoxicillin-pot clavulanate, Aspirin, Ducodyl [bisacodyl], Erythromycin, Glipizide, Metformin hcl, and Macrobid [nitrofurantoin]  Review of Systems  Constitutional: Negative for chills, fever and malaise/fatigue.  HENT: Negative for congestion, sinus pain and sore throat.   Eyes: Negative for blurred vision and pain.  Respiratory: Negative for cough and wheezing.   Cardiovascular: Negative for chest pain and leg swelling.  Gastrointestinal: Negative for abdominal pain, constipation, diarrhea, heartburn, nausea and vomiting.  Genitourinary: Negative for dysuria, frequency, hematuria and urgency.  Musculoskeletal: Negative for back pain, joint pain, myalgias and neck pain.  Skin: Negative for itching and rash.  Neurological: Negative for dizziness, tremors and weakness.  Endo/Heme/Allergies: Does not bruise/bleed easily.  Psychiatric/Behavioral: Negative for depression. The patient is not nervous/anxious and does not have  insomnia.     Objective: BP 132/80   Ht 5\' 8"  (1.727 m)   Wt 263 lb (119.3 kg)   BMI 39.99 kg/m   Filed Weights   07/20/20 1328  Weight: 263 lb (119.3 kg)   Physical Exam Constitutional:      General: She is not in acute distress.    Appearance: She is well-developed.  HENT:     Head: Normocephalic and atraumatic. No laceration.     Right Ear: Hearing normal.     Left Ear: Hearing normal.     Mouth/Throat:     Pharynx: Uvula midline.  Eyes:     Pupils: Pupils are equal, round, and reactive to light.  Neck:     Thyroid: No thyromegaly.  Cardiovascular:     Rate and Rhythm: Normal rate and regular rhythm.     Heart sounds: No murmur heard. No friction rub. No gallop.  Pulmonary:     Effort: Pulmonary effort is normal. No respiratory distress.     Breath sounds: Normal breath sounds. No wheezing.  Abdominal:     General: Bowel sounds are normal. There is no distension.     Palpations: Abdomen is soft.     Tenderness: There is no abdominal tenderness. There is no rebound.  Musculoskeletal:        General: Normal range of motion.     Cervical back: Normal range of motion and neck supple.  Neurological:     Mental Status: She is alert and oriented to person, place, and time.     Cranial Nerves: No cranial nerve deficit.  Skin:    General: Skin is warm and dry.  Psychiatric:        Judgment: Judgment normal.  Vitals reviewed.     Assessment: 1. Uterine prolapse   2. Post-menopausal bleeding   Plan for [facility] BSO CYSTOSCOPY  I have had a careful discussion with this patient about all the options available and the risk/benefits of each. I have fully informed this patient that surgery may subject her to a variety of discomforts and risks: She understands that most patients have surgery with little difficulty, but problems can happen ranging from minor to fatal. These include nausea, vomiting, pain, bleeding, infection, poor healing, hernia, or formation of adhesions.  Unexpected reactions may occur from any drug or anesthetic given. Unintended injury may occur to other pelvic or abdominal structures such as Fallopian tubes, ovaries, bladder, ureter (tube from kidney to bladder), or bowel. Nerves going from the pelvis to the legs may be injured. Any such injury may require immediate or later additional surgery to correct the problem. Excessive blood loss requiring transfusion is very unlikely but possible. Dangerous blood clots may form in the legs or lungs. Physical and sexual activity will be restricted in varying degrees for an indeterminate period of time but most often 2-6 weeks.  Finally, she understands that it is impossible to list every possible undesirable effect and that the condition for which surgery is done is not always cured or significantly improved, and in rare cases may be even worse.Ample time was given to answer all questions.  Barnett Applebaum, MD, [facility], [facility] 07/20/2020  1:50 PM

## 2020-07-21 ENCOUNTER — Ambulatory Visit: Payer: Medicare PPO | Admitting: Family Medicine

## 2020-07-22 ENCOUNTER — Ambulatory Visit: Payer: Medicare PPO | Admitting: Cardiology

## 2020-07-25 ENCOUNTER — Encounter: Payer: Medicare PPO | Admitting: Obstetrics & Gynecology

## 2020-07-25 ENCOUNTER — Encounter: Payer: Self-pay | Admitting: Cardiology

## 2020-07-25 ENCOUNTER — Other Ambulatory Visit: Payer: Self-pay

## 2020-07-25 ENCOUNTER — Ambulatory Visit: Payer: Medicare PPO | Admitting: Cardiology

## 2020-07-25 VITALS — BP 144/90 | HR 91 | Ht 68.0 in | Wt 263.0 lb

## 2020-07-25 DIAGNOSIS — E7800 Pure hypercholesterolemia, unspecified: Secondary | ICD-10-CM

## 2020-07-25 DIAGNOSIS — I10 Essential (primary) hypertension: Secondary | ICD-10-CM

## 2020-07-25 DIAGNOSIS — Z01818 Encounter for other preprocedural examination: Secondary | ICD-10-CM | POA: Diagnosis not present

## 2020-07-25 DIAGNOSIS — Z6839 Body mass index (BMI) 39.0-39.9, adult: Secondary | ICD-10-CM

## 2020-07-25 NOTE — Patient Instructions (Signed)
Medication Instructions:  No Changes    Lab Work: None Ordered    Testing/Procedures:  1.  Your physician has requested that you have an echocardiogram ASAP. Echocardiography is a painless test that uses sound waves to create images of your heart. It provides your doctor with information about the size and shape of your heart and how well your heart's chambers and valves are working. This procedure takes approximately one hour. There are no restrictions for this procedure.   [place]       Your caregiver has ordered a Stress Test with nuclear imaging. The purpose of this test is to evaluate the blood supply to your heart muscle. This procedure is referred to as a "Non-Invasive Stress Test." This is because other than having an IV started in your vein, nothing is inserted or "invades" your body. Cardiac stress tests are done to find areas of poor blood flow to the heart by determining the extent of coronary artery disease (CAD). Some patients exercise on a treadmill, which naturally increases the blood flow to your heart, while others who are  unable to walk on a treadmill due to physical limitations have a pharmacologic/chemical stress agent called Lexiscan . This medicine will mimic walking on a treadmill by temporarily increasing your coronary blood flow.      PLEASE REPORT TO [facility] MEDICAL MALL ENTRANCE   THE VOLUNTEERS AT THE FIRST DESK WILL DIRECT YOU WHERE TO GO     *Please note: these test may take anywhere between 2-4 hours to complete       Date of Procedure:_____________________________________   Arrival Time for Procedure:______________________________    PLEASE NOTIFY THE OFFICE AT LEAST 24 HOURS IN ADVANCE IF YOU ARE UNABLE TO KEEP YOUR APPOINTMENT.  [place] 24 HOURS IN ADVANCE IF YOU ARE UNABLE TO KEEP YOUR APPOINTMENT. [phone]         How to prepare for your Myoview test:         __XX__:  Hold  diabetes medication the morning of procedure: JANUVIA , JARDIANCE    __XX__:  Hold other medications as follows: hydrochlorothiazide (HYDRODIURIL) 25 MG tablet    1. Do not eat or drink after midnight  2. No caffeine for 24 hours prior to test  3. No smoking 24 hours prior to test.  4. Unless instructed otherwise, Take your medication with a small sips of water.    5.         Ladies, please do not wear dresses. Skirts or pants are appropriate. Please wear a short sleeve shirt.  6. No perfume, cologne or lotion.  7. Wear comfortable walking shoes. No heels!    Follow-Up: At [facility], you and your health needs are our priority.  As part of our continuing mission to provide you with exceptional heart care, we have created designated Provider Care Teams.  These Care Teams include your primary Cardiologist (physician) and Advanced Practice Providers (APPs -  Physician Assistants and Nurse Practitioners) who all work together to provide you with the care you need, when you need it.  We recommend signing up for the patient portal called "MyChart".  Sign up information is provided on this After Visit Summary.  MyChart is used to connect with patients for Virtual Visits (Telemedicine).  Patients are able to view lab/test results, encounter notes, upcoming appointments, etc.  Non-urgent messages can be sent to your provider as well.   To learn more about  what you can do with MyChart, go to [email or web address].    Your next appointment:   Follow up after testing   The format for your next appointment:   In Person  Provider:   Kate Sable, MD   Other Instructions

## 2020-07-25 NOTE — Progress Notes (Signed)
Cardiology Office Note:    Date:  07/25/2020   ID:  Karen Stephenson, DOB 1956/04/15, MRN [id number]  PCP:  Jerrol Banana., MD  [place] Cardiologist:  No primary care provider on file.  [facility] Electrophysiologist:  None   Referring MD: Jerrol Banana.,*   Chief Complaint  Patient presents with  . NEW patient-preop clearance for hysterectomy    History of Present Illness:    Karen Stephenson is a 65 y.o. female with a hx of hypertension, hyperlipidemia, diabetes, sleep apnea who presents for pre op eval prior to hysterectomy.  She has a history of vaginal prolapse, is planning on getting a hysterectomy.  Her surgery is scheduled for 1 week.  Had an EKG by primary care provider which looked abnormal.  She denies any history of heart disease.  Denies chest pain or shortness of breath.  Denies any heart disease history.  Past Medical History:  Diagnosis Date  . Anginal pain ([place])   . Asthma   . Diabetes mellitus without complication ([place])   . GERD (gastroesophageal reflux disease)   . Hypercholesteremia   . Hypertension   . Hypokalemia   . Sleep apnea     Past Surgical History:  Procedure Laterality Date  . BACK SURGERY  1986  . COLONOSCOPY WITH PROPOFOL N/A 07/17/2017   Procedure: COLONOSCOPY WITH PROPOFOL;  Surgeon: Manya Silvas, MD;  Location: [facility] ENDOSCOPY;  Service: Endoscopy;  Laterality: N/A;  . NASAL SINUS SURGERY    . UTERINE FIBROID SURGERY      Current Medications: Current Meds  Medication Sig  . acarbose (PRECOSE) 25 MG tablet TAKE 1 TABLET BY MOUTH THREE TIMES DAILY WITH MEALS  . albuterol (VENTOLIN HFA) 108 (90 Base) MCG/ACT inhaler Inhale 2 puffs into the lungs every 6 (six) hours as needed for wheezing or shortness of breath.  Marland Kitchen BIOTIN PO Take 1 tablet by mouth daily.  . cadexomer iodine (IODOSORB) 0.9 % gel Apply 1 application topically daily.  . cetirizine (ZYRTEC) 10 MG tablet Take 10 mg by mouth daily.  .  chlorpheniramine-HYDROcodone (TUSSIONEX PENNKINETIC ER) 10-8 MG/5ML SUER Take 5 mLs by mouth every 12 (twelve) hours as needed for cough.  . fluticasone (FLONASE) 50 MCG/ACT nasal spray Use 2 spray(s) in each nostril once daily  . Fluticasone-Salmeterol (ADVAIR) 250-50 MCG/DOSE AEPB INHALE ONE DOSE BY MOUTH AS NEEDED  . glucose blood test strip Check sugar once daily, E 11.9  . Homeopathic Products (LEG CRAMP RELIEF PO) Take 2 capsules by mouth daily.   . hydrochlorothiazide (HYDRODIURIL) 25 MG tablet Take 1 tablet by mouth once daily  . hyoscyamine (LEVBID) 0.375 MG 12 hr tablet Take 1 tablet (0.375 mg total) by mouth as needed.  Marland Kitchen JANUVIA 100 MG tablet Take 1 tablet by mouth once daily  . JARDIANCE 10 MG TABS tablet Take 1 tablet by mouth once daily  . Lancets (ONETOUCH ULTRASOFT) lancets Check sugar once daily ,DX E11.9  . losartan (COZAAR) 100 MG tablet Take 1 tablet by mouth once daily  . montelukast (SINGULAIR) 10 MG tablet TAKE 1 TABLET BY MOUTH AT BEDTIME  . pantoprazole (PROTONIX) 40 MG tablet Take 1 tablet by mouth once daily  . rosuvastatin (CRESTOR) 20 MG tablet Take 1 tablet by mouth once daily  . vitamin B-12 (CYANOCOBALAMIN) 1000 MCG tablet Take 1,000 mcg by mouth daily.  . Vitamin D, Cholecalciferol, 50 MCG (2000 UT) CAPS Take 2,000 Units by mouth daily.  Marland Kitchen zinc gluconate  50 MG tablet Take 50 mg by mouth daily.     Allergies:   Amoxicillin-pot clavulanate, Aspirin, Ducodyl [bisacodyl], Erythromycin, Glipizide, Metformin hcl, and Macrobid [nitrofurantoin]   Social History   Socioeconomic History  . Marital status: Married    Spouse name: Not on file  . Number of children: Not on file  . Years of education: Not on file  . Highest education level: Not on file  Occupational History  . Not on file  Tobacco Use  . Smoking status: Former Smoker    Types: Cigarettes    Quit date: 07/01/2013    Years since quitting: 7.0  . Smokeless tobacco: Never Used  Vaping Use  .  Vaping Use: Never used  Substance and Sexual Activity  . Alcohol use: No  . Drug use: No  . Sexual activity: Not on file  Other Topics Concern  . Not on file  Social History Narrative  . Not on file   Social Determinants of Health   Financial Resource Strain: Not on file  Food Insecurity: Not on file  Transportation Needs: Not on file  Physical Activity: Not on file  Stress: Not on file  Social Connections: Not on file     Family History: The patient's family history includes Breast cancer (age of onset: 29) in her mother; Breast cancer (age of onset: 17) in her sister; Diabetes in her mother; Heart disease in her father and paternal grandfather; Hypertension in her brother. There is no history of Colon cancer or Ovarian cancer.  ROS:   Please see the history of present illness.     All other systems reviewed and are negative.  EKGs/Labs/Other Studies Reviewed:    The following studies were reviewed today:   EKG:  EKG is  ordered today.  The ekg ordered today demonstrates normal sinus rhythm, possible old anterior infarct.  Recent Labs: 06/08/2020: ALT 24; BUN 12; Creatinine, Ser 0.78; Hemoglobin 12.3; Platelets 263; Potassium 4.2; Sodium 138; TSH 2.050  Recent Lipid Panel    Component Value Date/Time   CHOL 110 06/08/2020 1312   TRIG 112 06/08/2020 1312   HDL 32 (L) 06/08/2020 1312   CHOLHDL 3.4 06/08/2020 1312   LDLCALC 57 06/08/2020 1312     Risk Assessment/Calculations:      Physical Exam:    VS:  BP (!) 144/90 (BP Location: Right Arm, Patient Position: Sitting, Cuff Size: Large)   Pulse 91   Ht 5\' 8"  (1.727 m)   Wt 263 lb (119.3 kg)   SpO2 98%   BMI 39.99 kg/m     Wt Readings from Last 3 Encounters:  07/25/20 263 lb (119.3 kg)  07/20/20 265 lb (120.2 kg)  07/20/20 263 lb (119.3 kg)     GEN:  Well nourished, well developed in no acute distress HEENT: Normal NECK: No JVD; No carotid bruits LYMPHATICS: No lymphadenopathy CARDIAC: RRR, no  murmurs, rubs, gallops RESPIRATORY:  Clear to auscultation without rales, wheezing or rhonchi  ABDOMEN: Soft, non-tender, non-distended MUSCULOSKELETAL:  No edema; No deformity  SKIN: Warm and dry NEUROLOGIC:  Alert and oriented x 3 PSYCHIATRIC:  Normal affect   ASSESSMENT:    1. Pre-op evaluation   2. Primary hypertension   3. Pure hypercholesterolemia   4. BMI 39.0-39.9,adult    PLAN:    In order of problems listed above:  1. Vaginal prolapse, hysterectomy being planned.  EKG showing possible old anterior infarct.  Patient denies chest pain or shortness of breath.  Will obtain echo  to evaluate systolic function, Lexiscan Myoview to evaluate presence of ischemia.  Hopefully this can be done this week not to delay patient's procedure.  If no significant abnormalities, patient can proceed with procedure at acceptable cardiac risk. 2. Hypertension, BP typically controlled, elevated today.  Continue BP meds as prescribed. 3. Hyperlipidemia, continue Crestor 4. Obesity, low-calorie diet, weight loss recommended.  Follow-up after echo and and Myoview.   Shared Decision Making/Informed Consent The risks [chest pain, shortness of breath, cardiac arrhythmias, dizziness, blood pressure fluctuations, myocardial infarction, stroke/transient ischemic attack, nausea, vomiting, allergic reaction, radiation exposure, metallic taste sensation and life-threatening complications (estimated to be 1 in 10,000)], benefits (risk stratification, diagnosing coronary artery disease, treatment guidance) and alternatives of a nuclear stress test were discussed in detail with Ms. Bhattacharyya and she agrees to proceed.     Medication Adjustments/Labs and Tests Ordered: Current medicines are reviewed at length with the patient today.  Concerns regarding medicines are outlined above.  Orders Placed This Encounter  Procedures  . NM Myocar Multi W/Spect W/Wall Motion / EF  . EKG 12-Lead  . ECHOCARDIOGRAM COMPLETE    No orders of the defined types were placed in this encounter.   Patient Instructions  Medication Instructions:  No Changes    Lab Work: None Ordered    Testing/Procedures:  1.  Your physician has requested that you have an echocardiogram ASAP. Echocardiography is a painless test that uses sound waves to create images of your heart. It provides your doctor with information about the size and shape of your heart and how well your heart's chambers and valves are working. This procedure takes approximately one hour. There are no restrictions for this procedure.   [place]       Your caregiver has ordered a Stress Test with nuclear imaging. The purpose of this test is to evaluate the blood supply to your heart muscle. This procedure is referred to as a "Non-Invasive Stress Test." This is because other than having an IV started in your vein, nothing is inserted or "invades" your body. Cardiac stress tests are done to find areas of poor blood flow to the heart by determining the extent of coronary artery disease (CAD). Some patients exercise on a treadmill, which naturally increases the blood flow to your heart, while others who are  unable to walk on a treadmill due to physical limitations have a pharmacologic/chemical stress agent called Lexiscan . This medicine will mimic walking on a treadmill by temporarily increasing your coronary blood flow.      PLEASE REPORT TO [facility] MEDICAL MALL ENTRANCE   THE VOLUNTEERS AT THE FIRST DESK WILL DIRECT YOU WHERE TO GO     *Please note: these test may take anywhere between 2-4 hours to complete       Date of Procedure:_____________________________________   Arrival Time for Procedure:______________________________    PLEASE NOTIFY THE OFFICE AT LEAST 24 HOURS IN ADVANCE IF YOU ARE UNABLE TO KEEP YOUR APPOINTMENT.  [place] 24 HOURS IN ADVANCE IF YOU ARE UNABLE TO KEEP YOUR  APPOINTMENT. [phone]         How to prepare for your Myoview test:         __XX__:  Hold diabetes medication the morning of procedure: JANUVIA , JARDIANCE    __XX__:  Hold other medications as follows: hydrochlorothiazide (HYDRODIURIL) 25 MG tablet    1. Do not eat or drink after midnight  2. No caffeine for  24 hours prior to test  3. No smoking 24 hours prior to test.  4. Unless instructed otherwise, Take your medication with a small sips of water.    5.         Ladies, please do not wear dresses. Skirts or pants are appropriate. Please wear a short sleeve shirt.  6. No perfume, cologne or lotion.  7. Wear comfortable walking shoes. No heels!    Follow-Up: At [facility], you and your health needs are our priority.  As part of our continuing mission to provide you with exceptional heart care, we have created designated Provider Care Teams.  These Care Teams include your primary Cardiologist (physician) and Advanced Practice Providers (APPs -  Physician Assistants and Nurse Practitioners) who all work together to provide you with the care you need, when you need it.  We recommend signing up for the patient portal called "MyChart".  Sign up information is provided on this After Visit Summary.  MyChart is used to connect with patients for Virtual Visits (Telemedicine).  Patients are able to view lab/test results, encounter notes, upcoming appointments, etc.  Non-urgent messages can be sent to your provider as well.   To learn more about what you can do with MyChart, go to [email or web address].    Your next appointment:   Follow up after testing   The format for your next appointment:   In Person  Provider:   Kate Sable, MD   Other Instructions      Signed, Kate Sable, MD  07/25/2020 1:09 PM    [place]

## 2020-07-26 ENCOUNTER — Encounter
Admission: RE | Admit: 2020-07-26 | Discharge: 2020-07-26 | Disposition: A | Discharge status: Home / Self Care | Payer: Medicare PPO | Source: Ambulatory Visit | Attending: Obstetrics & Gynecology | Admitting: Obstetrics & Gynecology

## 2020-07-26 HISTORY — DX: Family history of other specified conditions: Z84.89

## 2020-07-26 NOTE — Progress Notes (Signed)
[facility] Perioperative Services: Pre-Admission/Anesthesia Testing   Date: 07/26/20 Name: Karen Stephenson MRN:   [id number]  Re: Consideration of preoperative prophylactic antibiotic change   Request sent to: Gae Dry, MD (routed and/or faxed via [facility])  Planned Surgical Procedure(s):    Case: [id number] Date/Time: 08/02/20 0715   Procedure: TOTAL LAPAROSCOPIC HYSTERECTOMY WITH BILATERAL SALPINGO OOPHORECTOMY (Bilateral ) - RN TO ASSIST   Anesthesia type: Choice   Pre-op diagnosis:      uterine prolapse N81.4     post-menopausal bleeding N95.0   Location: [facility] OR ROOM 07 / [place] ORS FOR ANESTHESIA GROUP   Surgeons: Gae Dry, MD    Notes: 1. Patient has a documented allergy to amoxicillin-clavulanate  The actual reaction to PCN is unknown  2. Received PCN with no documented complications . AMOXICILLIN received on 06/12/2017 and 08/02/2017  3. Screened as appropriate for cephalosporin use during medication reconciliation . No immediate angioedema, dysphagia, SOB, anaphylaxis symptoms. . No severe rash involving mucous membranes or skin necrosis. . No hospital admissions related to side effects of PCN/cephalosporin use.  . No documented reaction to PCN or cephalosporin in the last 10 years.  Request:  As an evidence based approach to reducing the rate of incidence for post-operative SSI and the development of MDROs, could an agent with narrower coverage for preoperative prophylaxis in this patient's upcoming surgical course be considered?   1. Currently ordered preoperative prophylactic ABX: gentamicin + metronidazole.   2. Specifically requesting change to cephalosporin (CEFAZOLIN).   3. Please communicate decision with me and I will change the orders in Epic as per your direction.   Things to consider:  Many patients report that they were "allergic" to PCN earlier in life, however this does not translate into a true lifelong allergy.  Patients can lose sensitivity to specific IgE antibodies over time if PCN is avoided (Kleris & Lugar, 2019).   Up to 10% of the adult population and 15% of hospitalized patients report an allergy to PCN, however clinical studies suggest that 90% of those reporting an allergy can tolerate PCN antibiotics (Kleris & Lugar, 2019).   Cross-sensitivity between PCN and cephalosporins has been documented as being as high as 10%, however this estimation included data believed to have been collected in a setting where there was contamination. Newer data suggests that the prevalence of cross-sensitivity between PCN and cephalosporins is actually estimated to be closer to 1% (Hermanides et al., 2018).    Patients labeled as PCN allergic, whether they are truly allergic or not, have been found to have inferior outcomes in terms of rates of serious infection, and these patients tend to have longer hospital stays ([place], 2019).   Treatment related secondary infections, such as Clostridioides difficile, have been linked to the improper use of broad spectrum antibiotics in patients improperly labeled as PCN allergic (Kleris & Lugar, 2019).   Anaphylaxis from cephalosporins is rare and the evidence suggests that there is no increased risk of an anaphylactic type reaction when cephalosporins are used in a PCN allergic patient (Pichichero, 2006).  Citations: Hermanides J, Lemkes BA, Prins Pearla Dubonnet MW, [place]hor[place] I. Presumed ?-Lactam Allergy and Cross-reactivity in the Operating Theater: A Practical Approach. Anesthesiology. 2018 Aug;129(2):335-342. doi: 10.1097/ALN.0000000000002252. PMID: 21308657.  Kleris, [place]., & Lugar, P. L. (2019). Things We Do For No Reason: Failing to Question a Penicillin Allergy History. Journal of hospital medicine, 14(10), [phone]. Advance online publication. https://www.wallace-[place].info/  Pichichero, M. E. (2006). Cephalosporins  can be prescribed safely for  penicillin-allergic patients. Journal of family medicine, 55(2), 106-112. Accessed: https://cdn.mdedge.com/files/s29fs-public/Document/September-2017/5502JFP_AppliedEvidence1.pdf   Honor Loh, MSN, APRN, FNP-C, CEN [facility]  Peri-operative Services Nurse Practitioner FAX: [phone] 07/26/20 4:11 PM

## 2020-07-26 NOTE — Patient Instructions (Addendum)
Your procedure is scheduled DU:KGURKYH 08/02/2020  Report to the Registration Desk on the 1st floor of the [facility]. To find out your arrival time, please call [phone] between 1PM - 3PM on: MONDAY August 01, 2020  REMEMBER: Instructions that are not followed completely may result in serious medical risk, up to and including death; or upon the discretion of your surgeon and anesthesiologist your surgery may need to be rescheduled.  Do not eat food or drink liquids after midnight the night before surgery.  No gum chewing, lozengers or hard candies.   TAKE THESE MEDICATIONS THE MORNING OF SURGERY WITH A SIP OF WATER: ZYRTEC PANTOPRAZOLE  (take one the night before and one on the morning of surgery - helps to prevent nausea after surgery.)  Use inhalers on the day of surgery AND FLONASE  LAST DOSE ACARBOSE IS 07/30/2020  DO NOT LOSARTAN OR HYDROCHLOROTHIAZIDE ON DAY OF SURGERY.  Follow recommendations from Cardiologist, Pulmonologist or PCP regarding stopping Aspirin, Coumadin, Plavix, Eliquis, Pradaxa, or Pletal. STOP ASPIRIN  One week prior to surgery: Stop Anti-inflammatories (NSAIDS) such as Advil, Aleve, Ibuprofen, Motrin, Naproxen, Naprosyn and Aspirin based products such as Excedrin, Goodys Powder, BC Powder.   USE TYLENOL IS NEEDED  Stop ANY OVER THE COUNTER supplements until after surgery. STOP BIOTIN AND LEG CRAMP MED (However, you may continue taking Vitamin D, Vitamin B,  ZINC and multivitamin up until the day before surgery.)  No Alcohol for 24 hours before or after surgery.  No Smoking including e-cigarettes for 24 hours prior to surgery.  No chewable tobacco products for at least 6 hours prior to surgery.  No nicotine patches on the day of surgery.  Do not use any "recreational" drugs for at least a week prior to your surgery.  Please be advised that the combination of cocaine and anesthesia may have negative outcomes, up to and including death. If you test  positive for cocaine, your surgery will be cancelled.  On the morning of surgery brush your teeth with toothpaste and water, you may rinse your mouth with mouthwash if you wish. Do not swallow any toothpaste or mouthwash.  Do not wear jewelry, make-up, hairpins, clips or nail polish.  Do not wear lotions, powders, or perfumes OR DEODORANT    Do not shave body from the neck down 48 hours prior to surgery just in case you cut yourself which could leave a site for infection.  Also, freshly shaved skin may become irritated if using the CHG soap.  Contact lenses, hearing aids and dentures may not be worn into surgery.  Do not bring valuables to the hospital. [facility] is not responsible for any missing/lost belongings or valuables.   Use CHG Soap  as directed on instruction sheet.  Notify your doctor if there is any change in your medical condition (cold, fever, infection).  Wear comfortable clothing (specific to your surgery type) to the hospital.  Plan for stool softeners for home use; pain medications have a tendency to cause constipation. You can also help prevent constipation by eating foods high in fiber such as fruits and vegetables and drinking plenty of fluids as your diet allows.  After surgery, you can help prevent lung complications by doing breathing exercises.  Take deep breaths and cough every 1-2 hours. Your doctor may order a device called an Incentive Spirometer to help you take deep breaths. When coughing or sneezing, hold a pillow firmly against your incision with both hands. This is called "splinting." Doing  this helps protect your incision. It also decreases belly discomfort.  If you are being admitted to the hospital overnight, [place]   If you are being discharged the day of surgery, you will not be allowed to drive home. You will need a responsible adult (18 years or older) to drive you home and stay with you that night.   Please call  the [place] Dept. at [phone] if you have any questions about these instructions.  Visitation Policy:  Patients undergoing a surgery or procedure may have one family member or support person with them as long as that person is not COVID-19 positive or experiencing its symptoms.  That person may remain in the waiting area during the procedure.  Inpatient Visitation:    Visiting hours are 7 a.m. to 8 p.m. Patients will be allowed one visitor. The visitor may change daily. The visitor must pass COVID-19 screenings, use hand sanitizer when entering and exiting the patient's room and wear a mask at all times, including in the patient's room. Patients must also wear a mask when staff or their visitor are in the room. Masking is required regardless of vaccination status. Systemwide, no visitors 17 or younger.

## 2020-07-27 ENCOUNTER — Other Ambulatory Visit: Payer: Self-pay

## 2020-07-27 ENCOUNTER — Encounter
Admission: RE | Admit: 2020-07-27 | Discharge: 2020-07-27 | Disposition: A | Discharge status: Home / Self Care | Payer: Medicare PPO | Source: Ambulatory Visit | Attending: Cardiology | Admitting: Cardiology

## 2020-07-27 DIAGNOSIS — I10 Essential (primary) hypertension: Secondary | ICD-10-CM | POA: Diagnosis not present

## 2020-07-27 DIAGNOSIS — Z01810 Encounter for preprocedural cardiovascular examination: Secondary | ICD-10-CM | POA: Diagnosis not present

## 2020-07-27 LAB — NM MYOCAR MULTI W/SPECT W/WALL MOTION / EF
Estimated workload: 1 METS
Exercise duration (min): 0 min
Exercise duration (sec): 0 s
LV dias vol: 89 mL (ref 46–106)
LV sys vol: 47 mL
MPHR: 154 {beats}/min
Peak HR: 102 {beats}/min
Percent HR: 66 %
Rest HR: 70 {beats}/min
SDS: 3
SRS: 8
SSS: 7
TID: 1.03

## 2020-07-27 MED ORDER — REGADENOSON 0.4 MG/5ML IV SOLN
0.4000 mg | Freq: Once | INTRAVENOUS | Status: AC
  Administered 2020-07-27: 0.4 mg via INTRAVENOUS

## 2020-07-27 MED ORDER — TECHNETIUM TC 99M TETROFOSMIN IV KIT
9.4770 | PACK | Freq: Once | INTRAVENOUS | Status: AC | PRN
  Administered 2020-07-27: 9.477 via INTRAVENOUS

## 2020-07-27 MED ORDER — TECHNETIUM TC 99M TETROFOSMIN IV KIT
29.4760 | PACK | Freq: Once | INTRAVENOUS | Status: AC | PRN
  Administered 2020-07-27: 29.476 via INTRAVENOUS

## 2020-07-27 NOTE — Progress Notes (Signed)
  [facility] Perioperative Services: Pre-Admission/Anesthesia Testing     Date: 07/27/20  Name: Karen Stephenson MRN:   [id number]  Re: Change in [place] for upcoming surgery   Case: [id number] Date/Time: 08/02/20 0715   Procedure: TOTAL LAPAROSCOPIC HYSTERECTOMY WITH BILATERAL SALPINGO OOPHORECTOMY (Bilateral ) - RN TO ASSIST   Anesthesia type: Choice   Pre-op diagnosis:      uterine prolapse N81.4     post-menopausal bleeding N95.0   Location: [facility] OR ROOM 07 / [place] ORS FOR ANESTHESIA GROUP   Surgeons: Gae Dry, MD    Primary attending surgeon was consulted regarding consideration of therapeutic change in antimicrobial agent being used for preoperative prophylaxis in this patient's upcoming surgical case. Following analysis of the risk versus benefits, Dr. Kenton Kingfisher, Linton Ham, MD advising that it would be acceptable to discontinue the ordered gentamicin + metronidazole and place an order for cefazolin 2 gm IV on call to the OR. Orders for this patient were amended by me following collaborative conversation with attending surgeon.  Honor Loh, MSN, APRN, FNP-C, CEN [facility]  Peri-operative Services Nurse Practitioner Phone: [phone] 07/27/20 8:03 AM

## 2020-07-28 ENCOUNTER — Encounter
Admission: RE | Admit: 2020-07-28 | Discharge: 2020-07-28 | Disposition: A | Discharge status: Home / Self Care | Payer: Medicare PPO | Source: Ambulatory Visit | Attending: Obstetrics & Gynecology | Admitting: Obstetrics & Gynecology

## 2020-07-28 ENCOUNTER — Ambulatory Visit (INDEPENDENT_AMBULATORY_CARE_PROVIDER_SITE_OTHER): Payer: Medicare PPO

## 2020-07-28 DIAGNOSIS — Z20822 Contact with and (suspected) exposure to covid-19: Secondary | ICD-10-CM | POA: Diagnosis not present

## 2020-07-28 DIAGNOSIS — Z01810 Encounter for preprocedural cardiovascular examination: Secondary | ICD-10-CM

## 2020-07-28 DIAGNOSIS — Z01812 Encounter for preprocedural laboratory examination: Secondary | ICD-10-CM | POA: Diagnosis not present

## 2020-07-28 DIAGNOSIS — Z6839 Body mass index (BMI) 39.0-39.9, adult: Secondary | ICD-10-CM | POA: Diagnosis not present

## 2020-07-28 LAB — CBC
HCT: 36.4 % (ref 36.0–46.0)
Hemoglobin: 11.8 g/dL — ABNORMAL LOW (ref 12.0–15.0)
MCH: 28 pg (ref 26.0–34.0)
MCHC: 32.4 g/dL (ref 30.0–36.0)
MCV: 86.3 fL (ref 80.0–100.0)
Platelets: 266 10*3/uL (ref 150–400)
RBC: 4.22 MIL/uL (ref 3.87–5.11)
RDW: 13.4 % (ref 11.5–15.5)
WBC: 6.1 10*3/uL (ref 4.0–10.5)
nRBC: 0 % (ref 0.0–0.2)

## 2020-07-28 LAB — TYPE AND SCREEN
ABO/RH(D): A POS
Antibody Screen: NEGATIVE

## 2020-07-28 LAB — ECHOCARDIOGRAM COMPLETE
Area-P 1/2: 3.74 cm2
S' Lateral: 2.3 cm

## 2020-07-29 ENCOUNTER — Other Ambulatory Visit: Payer: Self-pay

## 2020-07-29 ENCOUNTER — Other Ambulatory Visit
Admission: RE | Admit: 2020-07-29 | Discharge: 2020-07-29 | Disposition: A | Discharge status: Home / Self Care | Payer: Medicare PPO | Source: Ambulatory Visit | Attending: Obstetrics & Gynecology | Admitting: Obstetrics & Gynecology

## 2020-07-29 ENCOUNTER — Encounter: Payer: Self-pay | Admitting: Family

## 2020-07-29 ENCOUNTER — Ambulatory Visit: Payer: Medicare PPO | Admitting: Family

## 2020-07-29 VITALS — BP 130/82 | HR 76 | Ht 68.0 in | Wt 261.0 lb

## 2020-07-29 DIAGNOSIS — I10 Essential (primary) hypertension: Secondary | ICD-10-CM | POA: Diagnosis not present

## 2020-07-29 DIAGNOSIS — Z20822 Contact with and (suspected) exposure to covid-19: Secondary | ICD-10-CM | POA: Diagnosis not present

## 2020-07-29 DIAGNOSIS — Z01810 Encounter for preprocedural cardiovascular examination: Secondary | ICD-10-CM

## 2020-07-29 DIAGNOSIS — E782 Mixed hyperlipidemia: Secondary | ICD-10-CM

## 2020-07-29 DIAGNOSIS — Z01812 Encounter for preprocedural laboratory examination: Secondary | ICD-10-CM | POA: Diagnosis not present

## 2020-07-29 NOTE — Progress Notes (Signed)
Office Visit    Patient Name: [name] Date of Encounter: 07/29/2020  Primary Care Provider:  Jerrol Banana., MD Primary Cardiologist:  Kate Sable, MD Electrophysiologist:  None   Chief Complaint    Karen Stephenson is a 65 y.o. female with a hx of hypertension, hyperlipidemia, diabetes, sleep apnea presents today for follow-up after echocardiogram and Lexiscan.  Past Medical History    Past Medical History:  Diagnosis Date  . Anginal pain ([place])   . Asthma   . Diabetes mellitus without complication ([place])   . Family history of adverse reaction to anesthesia    SISTER HAS ISSUE DURING COLONOSCOPY  . GERD (gastroesophageal reflux disease)   . Hypercholesteremia   . Hypertension   . Hypokalemia   . Sleep apnea    WAS NOT GIVEN CPAP   Past Surgical History:  Procedure Laterality Date  . BACK SURGERY  1986  . COLONOSCOPY WITH PROPOFOL N/A 07/17/2017   Procedure: COLONOSCOPY WITH PROPOFOL;  Surgeon: Manya Silvas, MD;  Location: [facility] ENDOSCOPY;  Service: Endoscopy;  Laterality: N/A;  . NASAL SINUS SURGERY    . UTERINE FIBROID SURGERY      Allergies  Allergies  Allergen Reactions  . Amoxicillin-Pot Clavulanate     no further information given.  . Aspirin     Higher doses - passed out   . Ducodyl [Bisacodyl]     Passed out, extreme cramping   . Erythromycin     Other reaction(s): Stomach Ache Stomach pain.  . Glipizide     Extreme stomach aches   . Metformin Hcl     Extreme stomach cramps   . Macrobid [Nitrofurantoin]     Severe diarrhea, severe abdominal pain/cramping    History of Present Illness    Karen Stephenson is a 65 y.o. female with a hx of hypertension, hyperlipidemia, diabetes, sleep apnea last seen 07/25/2020 by Dr. Garen Lah.  She was seen in consult by Dr. Hassell Done for cardiovascular clearance for hysterectomy in the setting of vaginal prolapse.  Her EKG at primary care was abnormal and as such was recommended for stress test.  She had [place] 07/27/2020 which was low risk study.  The EF appeared under calculated at 40 and she was recommended for echocardiogram. Echo 07/28/20 shows EF 60-65%, no wall motion abnormalities, no significant abnormalities.   She presents today for follow up. Testing reviewed in depth.  She was reassured by the result. Reports no shortness of breath nor dyspnea on exertion. Reports no chest pain, pressure, or tightness. No edema, orthopnea, PND. Reports no palpitations.   No formal exercise routine though plans to start walking regimen. Endorses eating low salt, heart healthy diet.  EKGs/Labs/Other Studies Reviewed:   The following studies were reviewed today:  Lexiscan 07/27/20 There was no ST segment deviation noted during stress. T wave inversion was noted during stress. TWI in III/aVF, V1-V6 @ baseline. Inferior and anterolateral T flattening throughout study following lexiscan admin. The study is normal. This is a low risk study. The left ventricular ejection fraction visually is normal. measured EF appears undercalculated at 40. Correlation with echo advised There is no evidence for ischemia  Echo 07/28/20  1. Left ventricular ejection fraction, by estimation, is 60 to 65%. The  left ventricle has normal function. The left ventricle has no regional  wall motion abnormalities. Left ventricular diastolic parameters are  indeterminate.   2. Right ventricular systolic function is normal. The right ventricular  size is normal.  There is normal pulmonary artery systolic pressure.   3. The mitral valve is normal in structure. No evidence of mitral valve  regurgitation.   4. The aortic valve is tricuspid. Aortic valve regurgitation is not  visualized. Mild aortic valve sclerosis is present, with no evidence of  aortic valve stenosis.   5. The inferior vena cava is dilated in size with <50% respiratory  variability, suggesting right atrial pressure of 15 mmHg.   EKG: No EKG today.  EKG  independently reviewed from 07/26/2019 demonstrated normal sinus rhythm with possible old anterior infarct.  Recent Labs: 06/08/2020: ALT 24; BUN 12; Creatinine, Ser 0.78; Potassium 4.2; Sodium 138; TSH 2.050 07/28/2020: Hemoglobin 11.8; Platelets 266  Recent Lipid Panel    Component Value Date/Time   CHOL 110 06/08/2020 1312   TRIG 112 06/08/2020 1312   HDL 32 (L) 06/08/2020 1312   CHOLHDL 3.4 06/08/2020 1312   LDLCALC 57 06/08/2020 1312   Home Medications   Current Meds  Medication Sig  . acarbose (PRECOSE) 25 MG tablet TAKE 1 TABLET BY MOUTH THREE TIMES DAILY WITH MEALS  . albuterol (VENTOLIN HFA) 108 (90 Base) MCG/ACT inhaler Inhale 2 puffs into the lungs every 6 (six) hours as needed for wheezing or shortness of breath.  Marland Kitchen aspirin EC 81 MG tablet Take 81 mg by mouth daily. Swallow whole.  Marland Kitchen BIOTIN PO Take 1 tablet by mouth daily.  . cadexomer iodine (IODOSORB) 0.9 % gel Apply 1 application topically daily.  . cetirizine (ZYRTEC) 10 MG tablet Take 10 mg by mouth daily.  . chlorpheniramine-HYDROcodone (TUSSIONEX PENNKINETIC ER) 10-8 MG/5ML SUER Take 5 mLs by mouth every 12 (twelve) hours as needed for cough.  . fluticasone (FLONASE) 50 MCG/ACT nasal spray Use 2 spray(s) in each nostril once daily  . Fluticasone-Salmeterol (ADVAIR) 250-50 MCG/DOSE AEPB INHALE ONE DOSE BY MOUTH AS NEEDED  . glucose blood test strip Check sugar once daily, E 11.9  . Homeopathic Products (LEG CRAMP RELIEF PO) Take 2 capsules by mouth daily.   . hydrochlorothiazide (HYDRODIURIL) 25 MG tablet Take 1 tablet by mouth once daily  . hyoscyamine (LEVBID) 0.375 MG 12 hr tablet Take 1 tablet (0.375 mg total) by mouth as needed.  Marland Kitchen JANUVIA 100 MG tablet Take 1 tablet by mouth once daily  . JARDIANCE 10 MG TABS tablet Take 1 tablet by mouth once daily  . Lancets (ONETOUCH ULTRASOFT) lancets Check sugar once daily ,DX E11.9  . losartan (COZAAR) 100 MG tablet Take 1 tablet by mouth once daily  . montelukast  (SINGULAIR) 10 MG tablet TAKE 1 TABLET BY MOUTH AT BEDTIME  . pantoprazole (PROTONIX) 40 MG tablet Take 1 tablet by mouth once daily  . rosuvastatin (CRESTOR) 20 MG tablet Take 1 tablet by mouth once daily  . vitamin B-12 (CYANOCOBALAMIN) 1000 MCG tablet Take 1,000 mcg by mouth daily.  . Vitamin D, Cholecalciferol, 50 MCG (2000 UT) CAPS Take 2,000 Units by mouth daily.  Marland Kitchen zinc gluconate 50 MG tablet Take 50 mg by mouth daily.     Review of Systems   All other systems reviewed and are otherwise negative except as noted above.  Physical Exam    VS:  BP 130/82 (BP Location: Left Arm, Patient Position: Sitting, Cuff Size: Normal)   Pulse 76   Ht 5\' 8"  (1.727 m)   Wt 261 lb (118.4 kg)   SpO2 98%   BMI 39.68 kg/m  , BMI Body mass index is 39.68 kg/m.  Wt Readings from  Last 3 Encounters:  07/29/20 261 lb (118.4 kg)  07/26/20 263 lb (119.3 kg)  07/25/20 263 lb (119.3 kg)    GEN: Well nourished, well developed, in no acute distress. HEENT: normal. Neck: Supple, no JVD, carotid bruits, or masses. Cardiac: RRR, no murmurs, rubs, or gallops. No clubbing, cyanosis, edema.  Radials/DP/PT 2+ and equal bilaterally.  Respiratory:  Respirations regular and unlabored, clear to auscultation bilaterally. GI: Soft, nontender, nondistended. MS: No deformity or atrophy. Skin: Warm and dry, no rash. Neuro:  Strength and sensation are intact. Psych: Normal affect.  Assessment & Plan    1. Preoperative cardiovascular clearance -EKG 07/25/2020 with NSR and possible old anterior infarct.  Subsequent Lexiscan Myoview 07/26/2020 was low risk with no evidence of ischemia though EF was thought to be inaccurately measured.  Subsequent echocardiogram 07/29/2019 showed LVEF 60-65%, no wall motion abnormalities, no significant valvular abnormalities.  She has exercise tolerance of greater than 4 METS.  Per AHA/ACC guidelines she would be deemed acceptable risk for the planned procedure without further cardiovascular  testing.  We will forward to the surgeon's office via epic fax function so they are aware.  2. HTN - BP well controlled. Continue current antihypertensive regimen. Continue to follow with primary care provider  3. Dm2 -06/08/2020 A1c 8.4.  Discussed A1c goal less than 7.  Continue to follow with primary care provider.  Disposition: Follow up prn with Dr. Garen Lah or APP   Signed, Loel Dubonnet, NP 07/29/2020, 11:13 AM [place]

## 2020-07-29 NOTE — Patient Instructions (Signed)
Medication Instructions:  No medication changes today.   *If you need a refill on your cardiac medications before your next appointment, please call your pharmacy*  Lab Work: None ordered today.   Testing/Procedures: Your stress test showed no evidence of blockages.   Your echocardiogram showed normal heart pumping function and normal heart valves.   Follow-Up: At [facility], you and your health needs are our priority.  As part of our continuing mission to provide you with exceptional heart care, we have created designated Provider Care Teams.  These Care Teams include your primary Cardiologist (physician) and Advanced Practice Providers (APPs -  Physician Assistants and Nurse Practitioners) who all work together to provide you with the care you need, when you need it.  We recommend signing up for the patient portal called "MyChart".  Sign up information is provided on this After Visit Summary.  MyChart is used to connect with patients for Virtual Visits (Telemedicine).  Patients are able to view lab/test results, encounter notes, upcoming appointments, etc.  Non-urgent messages can be sent to your provider as well.   To learn more about what you can do with MyChart, go to [email or web address].    Your next appointment:   As-needed with Dr. Garen Lah   Other Instructions  Iron-Rich Diet  Iron is a mineral that helps your body to produce hemoglobin. Hemoglobin is a protein in red blood cells that carries oxygen to your body's tissues. Eating too little iron may cause you to feel weak and tired, and it can increase your risk of infection. Iron is naturally found in many foods, and many foods have iron added to them (iron-fortified foods). You may need to follow an iron-rich diet if you do not have enough iron in your body due to certain medical conditions. The amount of iron that you need each day depends on your age, your sex, and any medical conditions you have. Follow  instructions from your health care provider or a diet and nutrition specialist (dietitian) about how much iron you should eat each day. What are tips for following this plan? Reading food labels  Check food labels to see how many milligrams (mg) of iron are in each serving. Cooking  Cook foods in pots and pans that are made from iron.  Take these steps to make it easier for your body to absorb iron from certain foods: ? Soak beans overnight before cooking. ? Soak whole grains overnight and drain them before using. ? Ferment flours before baking, such as by using yeast in bread dough. Meal planning  When you eat foods that contain iron, you should eat them with foods that are high in vitamin C. These include oranges, peppers, tomatoes, potatoes, and mango. Vitamin C helps your body to absorb iron. General information  Take iron supplements only as told by your health care provider. An overdose of iron can be life-threatening. If you were prescribed iron supplements, take them with orange juice or a vitamin C supplement.  When you eat iron-fortified foods or take an iron supplement, you should also eat foods that naturally contain iron, such as meat, poultry, and fish. Eating naturally iron-rich foods helps your body to absorb the iron that is added to other foods or contained in a supplement.  Certain foods and drinks prevent your body from absorbing iron properly. Avoid eating these foods in the same meal as iron-rich foods or with iron supplements. These foods include: ? Coffee, black tea, and red wine. ? Milk, dairy  products, and foods that are high in calcium. ? Beans and soybeans. ? Whole grains. What foods should I eat? Fruits Prunes. Raisins. Eat fruits high in vitamin C, such as oranges, grapefruits, and strawberries, alongside iron-rich foods. Vegetables Spinach (cooked). Green peas. Broccoli. Fermented vegetables. Eat vegetables high in vitamin C, such as leafy greens,  potatoes, bell peppers, and tomatoes, alongside iron-rich foods. Grains Iron-fortified breakfast cereal. Iron-fortified whole-wheat bread. Enriched rice. Sprouted grains. Meats and other proteins Beef liver. Oysters. Beef. Shrimp. [place]. Chicken. [place]. Sardines. Chickpeas. Nuts. Tofu. Pumpkin seeds. Beverages Tomato juice. Fresh orange juice. Prune juice. Hibiscus tea. Fortified instant breakfast shakes. Sweets and desserts Blackstrap molasses. Seasonings and condiments Tahini. Fermented soy sauce. Other foods Wheat germ. The items listed above may not be a complete list of recommended foods and beverages. Contact a dietitian for more information. What foods should I avoid? Grains Whole grains. Bran cereal. Bran flour. Oats. Meats and other proteins Soybeans. Products made from soy protein. Black beans. Lentils. Mung beans. Split peas. Dairy Milk. Cream. Cheese. Yogurt. Cottage cheese. Beverages Coffee. Black tea. Red wine. Sweets and desserts Cocoa. Chocolate. Ice cream. Other foods Basil. Oregano. Large amounts of parsley. The items listed above may not be a complete list of foods and beverages to avoid. Contact a dietitian for more information. Summary  Iron is a mineral that helps your body to produce hemoglobin. Hemoglobin is a protein in red blood cells that carries oxygen to your body's tissues.  Iron is naturally found in many foods, and many foods have iron added to them (iron-fortified foods).  When you eat foods that contain iron, you should eat them with foods that are high in vitamin C. Vitamin C helps your body to absorb iron.  Certain foods and drinks prevent your body from absorbing iron properly, such as whole grains and dairy products. You should avoid eating these foods in the same meal as iron-rich foods or with iron supplements. This information is not intended to replace advice given to you by your health care provider. Make sure you discuss any questions  you have with your health care provider. Document Revised: 05/31/2017 Document Reviewed: 05/14/2017 Elsevier Patient Education  2021 [employer].

## 2020-07-29 NOTE — Progress Notes (Signed)
[facility] Perioperative Services  Pre-Admission/Anesthesia Testing Clinical Review  Date: 07/29/20  Patient Demographics:  Name: Karen Stephenson DOB:   [birth year] MRN:   [id number]  Planned Surgical Procedure(s):    Case: [id number] Date/Time: 08/02/20 0715   Procedure: TOTAL LAPAROSCOPIC HYSTERECTOMY WITH BILATERAL SALPINGO OOPHORECTOMY (Bilateral ) - RN TO ASSIST   Anesthesia type: Choice   Pre-op diagnosis:      uterine prolapse N81.4     post-menopausal bleeding N95.0   Location: [facility] OR ROOM 05 / [facility] ORS FOR ANESTHESIA GROUP   Surgeons: Nadara Mustard, MD    NOTE: Available PAT nursing documentation and vital signs have been reviewed. Clinical nursing staff has updated patient's PMH/PSHx, current medication list, and drug allergies/intolerances to ensure comprehensive history available to assist in medical decision making as it pertains to the aforementioned surgical procedure and anticipated anesthetic course.   Clinical Discussion:  Karen Stephenson is a 65 y.o. female who is submitted for pre-surgical anesthesia review and clearance prior to her undergoing the above procedure. Patient is a Former Smoker (quit 06/2013). Pertinent PMH includes: angina, HTN, HLD, T2DM, OSAH (not using nocturnal PAP therapy), asthma, GERD (on daily PPI therapy), obesity (last BMI 39.99 kg/m).  Patient was seen in preoperative consult by Dr. Debbe Odea (cardiology) on 07/25/2020; notes reviewed.  Patient was being seen for evaluation of an abnormal ECG that revealed anterolateral T wave inversions concerning for old anterior infarct.  Patient denied a PMH (+) for cardiac disease.  She denied chest pain, shortness of breath, PND, orthopnea, palpitations, peripheral edema, vertiginous symptoms, and presyncope/syncope.  Blood pressure loosely controlled at 144/90 on currently prescribed ARB and thiazide diuretic therapies.  She is on a statin for her HLD.  T2DM loosely  controlled on prescribed oral regimen; last Hgb A1c was 8.4% on 06/08/2020.  Patient is scheduled for a TLH/BSO in the near future.  Given EKG findings, the decision was made to pursue further cardiovascular work-up to assess for the presence of ischemia.  MD noted that in the absence of significant abnormalities, patient should be able to proceed with procedure at an acceptable risk for cardiovascular complications. Patient to follow-up in the outpatient cardiology clinic following completion of the ordered cardiovascular studies.   Myocardial perfusion imaging study performed on 07/27/2020 revealed no evidence of ischemia.  There were T wave inversions noted during stress, however baseline inversions noted in leads III, aVF, and V1-V6.  Cardiologist noted that the LVEF was visually normal, however measured EF appeared to be under calculated at 40%; correlation with echocardiogram was advised.  Diagnostic study was deemed to be normal/low risk.   TTE performed on 07/28/2020 revealed normal left ventricular systolic function (LVEF 60-65%) with no RWMAs or significant valvular insufficiency.  Patient was seen in follow-up consult by cardiology on 07/29/2020 by [name], NP-C; notes reviewed.  There were no acute changes noted from patient's previous assessment on July 25, 2020.  Results from recent cardiovascular testing reviewed with patient and reassurance was provided. Functional capacity, as defined by DASI, is documented as being >/= 4 METS. Per cardiology, "based on patient's past medical history and time since his last clinic visit, patient would be at an overall ACCEPTABLE risk for the planned procedure without further cardiovascular testing or intervention at this time". This patient is on daily antiplatelet therapy. She has been instructed on recommendations for holding her daily low-dose ASA for 7 days prior to her procedure. The patient has been instructed that her  last dose of her anticoagulant  will be on 07/26/2020.  She denies previous perioperative complications with anesthesia personally.  Patient notes that her sister had "issues" with anesthesia, however she is unable to advise on what her sister experienced.  She underwent a general anesthetic course here (ASA III) in 07/2017 with no documented complications.   Vitals with BMI 07/29/2020 07/26/2020 07/25/2020  Height 5\' 8"  5\' 8"  -  Weight 261 lbs 263 lbs -  BMI 0000000 40 -  Systolic [id number] - [id number]  Diastolic 82 - 90  Pulse 76 - -    Providers/Specialists:   NOTE: Primary physician provider listed below. Patient may have been seen by APP or partner within same practice.   PROVIDER ROLE / SPECIALTY LAST Naida Sleight, MD OB/GYN (Surgeon)  07/20/2020  Jerrol Banana., MD Primary Care Provider  07/13/2020  Kate Sable, MD  Cardiology  07/29/2020   Allergies:  Amoxicillin-pot clavulanate, Aspirin, Ducodyl [bisacodyl], Erythromycin, Glipizide, Metformin hcl, and Macrobid [nitrofurantoin]  Current Home Medications:   No current facility-administered medications for this encounter.   Marland Kitchen acarbose (PRECOSE) 25 MG tablet  . albuterol (VENTOLIN HFA) 108 (90 Base) MCG/ACT inhaler  . BIOTIN PO  . cetirizine (ZYRTEC) 10 MG tablet  . chlorpheniramine-HYDROcodone (TUSSIONEX PENNKINETIC ER) 10-8 MG/5ML SUER  . fluticasone (FLONASE) 50 MCG/ACT nasal spray  . Fluticasone-Salmeterol (ADVAIR) 250-50 MCG/DOSE AEPB  . Homeopathic Products (LEG CRAMP RELIEF PO)  . hyoscyamine (LEVBID) 0.375 MG 12 hr tablet  . JANUVIA 100 MG tablet  . losartan (COZAAR) 100 MG tablet  . montelukast (SINGULAIR) 10 MG tablet  . pantoprazole (PROTONIX) 40 MG tablet  . rosuvastatin (CRESTOR) 20 MG tablet  . vitamin B-12 (CYANOCOBALAMIN) 1000 MCG tablet  . Vitamin D, Cholecalciferol, 50 MCG (2000 UT) CAPS  . zinc gluconate 50 MG tablet  . aspirin EC 81 MG tablet  . cadexomer iodine (IODOSORB) 0.9 % gel  . glucose blood test strip  .  hydrochlorothiazide (HYDRODIURIL) 25 MG tablet  . JARDIANCE 10 MG TABS tablet  . Lancets (ONETOUCH ULTRASOFT) lancets   History:   Past Medical History:  Diagnosis Date  . Anginal pain ([place])   . Asthma   . Diabetes mellitus without complication ([place])   . Family history of adverse reaction to anesthesia    SISTER HAS ISSUE DURING COLONOSCOPY  . GERD (gastroesophageal reflux disease)   . Hypercholesteremia   . Hypertension   . Hypokalemia   . Sleep apnea    WAS NOT GIVEN CPAP   Past Surgical History:  Procedure Laterality Date  . BACK SURGERY  1986  . COLONOSCOPY WITH PROPOFOL N/A 07/17/2017   Procedure: COLONOSCOPY WITH PROPOFOL;  Surgeon: Manya Silvas, MD;  Location: [facility] ENDOSCOPY;  Service: Endoscopy;  Laterality: N/A;  . NASAL SINUS SURGERY    . UTERINE FIBROID SURGERY     Family History  Problem Relation Age of Onset  . Diabetes Mother   . Breast cancer Mother 71  . Heart disease Father   . Breast cancer Sister 64       dx twice   . Heart disease Paternal Grandfather   . Hypertension Brother   . Colon cancer Neg Hx   . Ovarian cancer Neg Hx      Pertinent Clinical Results:  LABS: Labs reviewed: Acceptable for surgery.  Hospital Outpatient Visit on 07/28/2020  Component Date Value Ref Range Status  . WBC 07/28/2020 6.1  4.0 - 10.5 K/uL Final  .  RBC 07/28/2020 4.22  3.87 - 5.11 MIL/uL Final  . Hemoglobin 07/28/2020 11.8* 12.0 - 15.0 g/dL Final  . HCT 07/28/2020 36.4  36.0 - 46.0 % Final  . MCV 07/28/2020 86.3  80.0 - 100.0 fL Final  . MCH 07/28/2020 28.0  26.0 - 34.0 pg Final  . MCHC 07/28/2020 32.4  30.0 - 36.0 g/dL Final  . RDW 07/28/2020 13.4  11.5 - 15.5 % Final  . Platelets 07/28/2020 266  150 - 400 K/uL Final  . nRBC 07/28/2020 0.0  0.0 - 0.2 % Final   Performed at [facility], [address], [place], [place] [address]  . ABO/RH(D) 07/28/2020 A POS   Final  . Antibody Screen 07/28/2020 NEG   Final  . Sample Expiration 07/28/2020  08/11/2020,2359   Final  . Extend sample reason 07/28/2020    Final                   Value:NO TRANSFUSIONS OR PREGNANCY IN THE PAST 3 MONTHS Performed at [facility], [address] [place], [place] [address]   . Sodium 06/08/2020 138  134 - 144 mmol/L Final  . Potassium 06/08/2020 4.2  3.5 - 5.2 mmol/L Final  . Chloride 06/08/2020 101  96 - 106 mmol/L Final  . CO2 06/08/2020 25  20 - 29 mmol/L Final  . Calcium 06/08/2020 9.5  8.7 - 10.3 mg/dL Final  . Total Protein 06/08/2020 7.3  6.0 - 8.5 g/dL Final  . Albumin 06/08/2020 4.2  3.8 - 4.8 g/dL Final  . Globulin, Total 06/08/2020 3.1  1.5 - 4.5 g/dL Final  . Albumin/Globulin Ratio 06/08/2020 1.4  1.2 - 2.2 Final  . Bilirubin Total 06/08/2020 0.6  0.0 - 1.2 mg/dL Final  . Alkaline Phosphatase 06/08/2020 71  44 - 121 IU/L Final                 **Please note reference interval change**  . AST 06/08/2020 24  0 - 40 IU/L Final  . ALT 06/08/2020 24  0 - 32 IU/L Final  . Hgb A1c MFr Bld 06/08/2020 8.4* 4.8 - 5.6 % Final   Comment:          Prediabetes: 5.7 - 6.4          Diabetes: >6.4          Glycemic control for adults with diabetes: <7.0   . Est. average glucose Bld gHb Est-m* 06/08/2020 194  mg/dL Final     ECG: Date: 07/25/2020 Time ECG obtained: 1201 PM Rate: 91 bpm Rhythm: normal sinus Axis (leads I and aVF): Left axis deviation Intervals: PR 170 ms. QRS 98 ms. QTc 376 ms. ST segment and T wave changes: No evidence of acute ST segment elevation or depression; possible anterior infarct (age undetermined) Comparison: Similar to previous tracing obtained on 04/06/2020.   IMAGING / PROCEDURES: ECHOCARDIOGRAM performed on 07/28/2020 1. Left ventricular ejection fraction, by estimation, is 60 to 65%.  2. The left ventricle has normal function.  3. The left ventricle has no regional wall motion abnormalities.  4. Left ventricular diastolic parameters are indeterminate.  5. Right ventricular systolic function is  normal.  6. The right ventricular size is normal.  7. There is normal pulmonary artery systolic pressure.  8. The mitral valve is normal in structure. No evidence of mitral valve regurgitation.  9. The aortic valve is tricuspid. Aortic valve regurgitation is not visualized. Mild aortic valve sclerosis is present, with no evidence  of aortic valve stenosis.  10. The inferior vena cava is dilated in size with <50% respiratory variability, suggesting right atrial pressure of 15 mmHg.   LEXISCAN performed on 07/27/2020 1. LVEF visually normal, however measured EF appears under calculated 40%; correlate with echocardiogram 2. T wave inversions noted in leads III, aVF, and V1-V6 with both stress and at baseline. 3. There is no evidence of ischemia, arrhythmia, or scar 4. This was a normal low risk study  Impression and Plan:  Karen Stephenson has been referred for pre-anesthesia review and clearance prior to her undergoing the planned anesthetic and procedural courses. Available labs, pertinent testing, and imaging results were personally reviewed by me. This patient has been appropriately cleared by cardiology with an overall ACCEPTABLE risk for significant perioperative cardiovascular complications.   Based on clinical review performed today (07/29/20), barring any significant acute changes in the patient's overall condition, it is anticipated that she will be able to proceed with the planned surgical intervention. Any acute changes in clinical condition may necessitate her procedure being postponed and/or cancelled. Pre-surgical instructions were reviewed with the patient during her PAT appointment and questions were fielded by PAT clinical staff.  Honor Loh, MSN, APRN, FNP-C, CEN [facility]  Peri-operative Services Nurse Practitioner Phone: [phone] 07/29/20 2:03 PM  NOTE: This note has been prepared using Dragon dictation software. Despite my best ability to proofread,  there is always the potential that unintentional transcriptional errors may still occur from this process.

## 2020-07-30 LAB — SARS CORONAVIRUS 2 (TAT 6-24 HRS): SARS Coronavirus 2: NEGATIVE

## 2020-08-01 MED ORDER — CEFAZOLIN SODIUM-DEXTROSE 2-4 GM/100ML-% IV SOLN
2.0000 g | Freq: Once | INTRAVENOUS | Status: AC
  Administered 2020-08-02: 2 g via INTRAVENOUS

## 2020-08-01 MED ORDER — SODIUM CHLORIDE 0.9 % IV SOLN: INTRAVENOUS | Status: DC

## 2020-08-01 NOTE — Anesthesia Preprocedure Evaluation (Addendum)
Anesthesia Evaluation  Patient identified by MRN, date of birth, ID band Patient awake    Reviewed: Allergy & Precautions, H&P , NPO status , Patient's Chart, lab work & pertinent test results  History of Anesthesia Complications Negative for: history of anesthetic complications  Airway Mallampati: I  TM Distance: >3 FB Neck ROM: full    Dental  (+) Chipped   Pulmonary asthma , sleep apnea , neg COPD, former smoker,    breath sounds clear to auscultation       Cardiovascular hypertension, (-) angina(-) Past MI and (-) Cardiac Stents (-) dysrhythmias  Rhythm:regular Rate:Normal     Neuro/Psych negative neurological ROS  negative psych ROS   GI/Hepatic Neg liver ROS, GERD  ,  Endo/Other  diabetesMorbid obesity  Renal/GU      Musculoskeletal   Abdominal   Peds  Hematology negative hematology ROS (+)   Anesthesia Other Findings Past Medical History: No date: Anginal pain (H[facility]) No date: Asthma No date: Diabetes mellitus without complication ([facility]) No date: Family history of adverse reaction to anesthesia     Comment:  SISTER HAS ISSUE DURING COLONOSCOPY No date: GERD (gastroesophageal reflux disease) No date: Hypercholesteremia No date: Hypertension No date: Hypokalemia No date: Sleep apnea     Comment:  WAS NOT GIVEN CPAP  Past Surgical History: 1986: BACK SURGERY 07/17/2017: COLONOSCOPY WITH PROPOFOL; N/A     Comment:  Procedure: COLONOSCOPY WITH PROPOFOL;  Surgeon: Manya Silvas, MD;  Location: [facility] ENDOSCOPY;  Service:               Endoscopy;  Laterality: N/A; No date: NASAL SINUS SURGERY No date: UTERINE FIBROID SURGERY     Reproductive/Obstetrics negative OB ROS                            Anesthesia Physical Anesthesia Plan  ASA: III  Anesthesia Plan: General ETT   Post-op Pain Management:    Induction:   PONV Risk Score and Plan: Ondansetron,  Dexamethasone, Treatment may vary due to age or medical condition and Propofol infusion  Airway Management Planned:   Additional Equipment:   Intra-op Plan:   Post-operative Plan:   Informed Consent: I have reviewed the patients History and Physical, chart, labs and discussed the procedure including the risks, benefits and alternatives for the proposed anesthesia with the patient or authorized representative who has indicated his/her understanding and acceptance.     Dental Advisory Given  Plan Discussed with: Anesthesiologist, CRNA and Surgeon  Anesthesia Plan Comments:        Anesthesia Quick Evaluation

## 2020-08-02 ENCOUNTER — Ambulatory Visit
Admission: RE | Admit: 2020-08-02 | Discharge: 2020-08-02 | Disposition: A | Discharge status: Home / Self Care | Payer: Medicare PPO | Attending: Obstetrics & Gynecology | Admitting: Obstetrics & Gynecology

## 2020-08-02 ENCOUNTER — Other Ambulatory Visit: Payer: Self-pay

## 2020-08-02 ENCOUNTER — Ambulatory Visit: Payer: Medicare PPO | Admitting: Urgent Care

## 2020-08-02 ENCOUNTER — Encounter
Admission: RE | Disposition: A | Discharge status: Home / Self Care | Payer: Self-pay | Source: Home / Self Care | Attending: Obstetrics & Gynecology

## 2020-08-02 ENCOUNTER — Encounter: Payer: Self-pay | Admitting: Obstetrics & Gynecology

## 2020-08-02 DIAGNOSIS — Z833 Family history of diabetes mellitus: Secondary | ICD-10-CM | POA: Insufficient documentation

## 2020-08-02 DIAGNOSIS — N950 Postmenopausal bleeding: Secondary | ICD-10-CM | POA: Diagnosis present

## 2020-08-02 DIAGNOSIS — K219 Gastro-esophageal reflux disease without esophagitis: Secondary | ICD-10-CM | POA: Diagnosis not present

## 2020-08-02 DIAGNOSIS — E119 Type 2 diabetes mellitus without complications: Secondary | ICD-10-CM | POA: Diagnosis not present

## 2020-08-02 DIAGNOSIS — N813 Complete uterovaginal prolapse: Secondary | ICD-10-CM | POA: Insufficient documentation

## 2020-08-02 DIAGNOSIS — D259 Leiomyoma of uterus, unspecified: Secondary | ICD-10-CM | POA: Diagnosis not present

## 2020-08-02 DIAGNOSIS — Z79899 Other long term (current) drug therapy: Secondary | ICD-10-CM | POA: Diagnosis not present

## 2020-08-02 DIAGNOSIS — N814 Uterovaginal prolapse, unspecified: Secondary | ICD-10-CM | POA: Diagnosis not present

## 2020-08-02 DIAGNOSIS — Z803 Family history of malignant neoplasm of breast: Secondary | ICD-10-CM | POA: Insufficient documentation

## 2020-08-02 DIAGNOSIS — Z7982 Long term (current) use of aspirin: Secondary | ICD-10-CM | POA: Insufficient documentation

## 2020-08-02 DIAGNOSIS — Z888 Allergy status to other drugs, medicaments and biological substances status: Secondary | ICD-10-CM | POA: Diagnosis not present

## 2020-08-02 DIAGNOSIS — Z8249 Family history of ischemic heart disease and other diseases of the circulatory system: Secondary | ICD-10-CM | POA: Diagnosis not present

## 2020-08-02 DIAGNOSIS — G4733 Obstructive sleep apnea (adult) (pediatric): Secondary | ICD-10-CM | POA: Diagnosis not present

## 2020-08-02 DIAGNOSIS — N812 Incomplete uterovaginal prolapse: Secondary | ICD-10-CM

## 2020-08-02 DIAGNOSIS — Z87891 Personal history of nicotine dependence: Secondary | ICD-10-CM | POA: Insufficient documentation

## 2020-08-02 DIAGNOSIS — Z7951 Long term (current) use of inhaled steroids: Secondary | ICD-10-CM | POA: Insufficient documentation

## 2020-08-02 DIAGNOSIS — N819 Female genital prolapse, unspecified: Secondary | ICD-10-CM | POA: Diagnosis present

## 2020-08-02 DIAGNOSIS — N840 Polyp of corpus uteri: Secondary | ICD-10-CM | POA: Insufficient documentation

## 2020-08-02 DIAGNOSIS — Z886 Allergy status to analgesic agent status: Secondary | ICD-10-CM | POA: Insufficient documentation

## 2020-08-02 DIAGNOSIS — E7800 Pure hypercholesterolemia, unspecified: Secondary | ICD-10-CM | POA: Diagnosis not present

## 2020-08-02 DIAGNOSIS — Z7984 Long term (current) use of oral hypoglycemic drugs: Secondary | ICD-10-CM | POA: Diagnosis not present

## 2020-08-02 DIAGNOSIS — Z880 Allergy status to penicillin: Secondary | ICD-10-CM | POA: Insufficient documentation

## 2020-08-02 DIAGNOSIS — Z881 Allergy status to other antibiotic agents status: Secondary | ICD-10-CM | POA: Diagnosis not present

## 2020-08-02 DIAGNOSIS — J45909 Unspecified asthma, uncomplicated: Secondary | ICD-10-CM | POA: Diagnosis not present

## 2020-08-02 HISTORY — PX: TOTAL LAPAROSCOPIC HYSTERECTOMY WITH BILATERAL SALPINGO OOPHORECTOMY: SHX6845

## 2020-08-02 LAB — POCT I-STAT, CHEM 8
BUN: 18 mg/dL (ref 8–23)
Calcium, Ion: 1.19 mmol/L (ref 1.15–1.40)
Chloride: 106 mmol/L (ref 98–111)
Creatinine, Ser: 1 mg/dL (ref 0.44–1.00)
Glucose, Bld: 163 mg/dL — ABNORMAL HIGH (ref 70–99)
HCT: 35 % — ABNORMAL LOW (ref 36.0–46.0)
Hemoglobin: 11.9 g/dL — ABNORMAL LOW (ref 12.0–15.0)
Potassium: 3.3 mmol/L — ABNORMAL LOW (ref 3.5–5.1)
Sodium: 142 mmol/L (ref 135–145)
TCO2: 24 mmol/L (ref 22–32)

## 2020-08-02 LAB — ABO/RH: ABO/RH(D): A POS

## 2020-08-02 LAB — GLUCOSE, CAPILLARY
Glucose-Capillary: 158 mg/dL — ABNORMAL HIGH (ref 70–99)
Glucose-Capillary: 186 mg/dL — ABNORMAL HIGH (ref 70–99)

## 2020-08-02 SURGERY — HYSTERECTOMY, TOTAL, LAPAROSCOPIC, WITH BILATERAL SALPINGO-OOPHORECTOMY
Anesthesia: General | Laterality: Bilateral

## 2020-08-02 MED ORDER — DEXAMETHASONE SODIUM PHOSPHATE 10 MG/ML IJ SOLN
INTRAMUSCULAR | Status: DC | PRN
  Administered 2020-08-02: 6 mg via INTRAVENOUS

## 2020-08-02 MED ORDER — FENTANYL CITRATE (PF) 250 MCG/5ML IJ SOLN
INTRAMUSCULAR | Status: AC
  Filled 2020-08-02: qty 5

## 2020-08-02 MED ORDER — MORPHINE SULFATE (PF) 2 MG/ML IV SOLN: 1.0000 mg | INTRAVENOUS | Status: DC | PRN

## 2020-08-02 MED ORDER — POVIDONE-IODINE 10 % EX SWAB
2.0000 "application " | Freq: Once | CUTANEOUS | Status: AC
  Administered 2020-08-02: 2 via TOPICAL

## 2020-08-02 MED ORDER — LACTATED RINGERS IV SOLN: INTRAVENOUS | Status: DC

## 2020-08-02 MED ORDER — FENTANYL CITRATE (PF) 100 MCG/2ML IJ SOLN
25.0000 ug | INTRAMUSCULAR | Status: DC | PRN
  Administered 2020-08-02: 25 ug via INTRAVENOUS

## 2020-08-02 MED ORDER — ROCURONIUM BROMIDE 10 MG/ML (PF) SYRINGE
PREFILLED_SYRINGE | INTRAVENOUS | Status: AC
  Filled 2020-08-02: qty 10

## 2020-08-02 MED ORDER — BUPIVACAINE HCL (PF) 0.5 % IJ SOLN
INTRAMUSCULAR | Status: DC | PRN
  Administered 2020-08-02: 10 mL
  Administered 2020-08-02: 4 mL

## 2020-08-02 MED ORDER — LIDOCAINE HCL (CARDIAC) PF 100 MG/5ML IV SOSY
PREFILLED_SYRINGE | INTRAVENOUS | Status: DC | PRN
  Administered 2020-08-02: 100 mg via INTRAVENOUS

## 2020-08-02 MED ORDER — PROMETHAZINE HCL 25 MG/ML IJ SOLN: 6.2500 mg | INTRAMUSCULAR | Status: DC | PRN

## 2020-08-02 MED ORDER — OXYCODONE HCL 5 MG/5ML PO SOLN: 5.0000 mg | Freq: Once | ORAL | Status: AC | PRN

## 2020-08-02 MED ORDER — SUGAMMADEX SODIUM 500 MG/5ML IV SOLN
INTRAVENOUS | Status: DC | PRN
  Administered 2020-08-02: 100 mg via INTRAVENOUS
  Administered 2020-08-02: 300 mg via INTRAVENOUS

## 2020-08-02 MED ORDER — ORAL CARE MOUTH RINSE: 15.0000 mL | Freq: Once | OROMUCOSAL | Status: AC

## 2020-08-02 MED ORDER — KETOROLAC TROMETHAMINE 30 MG/ML IJ SOLN
INTRAMUSCULAR | Status: AC
  Filled 2020-08-02: qty 1

## 2020-08-02 MED ORDER — FENTANYL CITRATE (PF) 100 MCG/2ML IJ SOLN
INTRAMUSCULAR | Status: DC | PRN
  Administered 2020-08-02 (×2): 50 ug via INTRAVENOUS

## 2020-08-02 MED ORDER — MIDAZOLAM HCL 2 MG/2ML IJ SOLN
INTRAMUSCULAR | Status: AC
  Filled 2020-08-02: qty 2

## 2020-08-02 MED ORDER — LACTATED RINGERS IV SOLN: INTRAVENOUS | Status: DC | PRN

## 2020-08-02 MED ORDER — ACETAMINOPHEN 325 MG PO TABS: 650.0000 mg | ORAL_TABLET | ORAL | Status: DC | PRN

## 2020-08-02 MED ORDER — ROCURONIUM BROMIDE 100 MG/10ML IV SOLN
INTRAVENOUS | Status: DC | PRN
  Administered 2020-08-02: 20 mg via INTRAVENOUS
  Administered 2020-08-02: 30 mg via INTRAVENOUS
  Administered 2020-08-02: 50 mg via INTRAVENOUS

## 2020-08-02 MED ORDER — OXYCODONE HCL 5 MG PO TABS
ORAL_TABLET | ORAL | Status: AC
  Administered 2020-08-02: 5 mg via ORAL
  Filled 2020-08-02: qty 1

## 2020-08-02 MED ORDER — PROPOFOL 10 MG/ML IV BOLUS
INTRAVENOUS | Status: AC
  Filled 2020-08-02: qty 20

## 2020-08-02 MED ORDER — PROPOFOL 10 MG/ML IV BOLUS
INTRAVENOUS | Status: DC | PRN
  Administered 2020-08-02: 180 mg via INTRAVENOUS

## 2020-08-02 MED ORDER — OXYCODONE HCL 5 MG PO TABS: 5.0000 mg | ORAL_TABLET | Freq: Once | ORAL | Status: AC

## 2020-08-02 MED ORDER — OXYCODONE-ACETAMINOPHEN 5-325 MG PO TABS: 1.0000 | ORAL_TABLET | ORAL | 0 refills | Status: DC | PRN

## 2020-08-02 MED ORDER — MIDAZOLAM HCL 2 MG/2ML IJ SOLN
INTRAMUSCULAR | Status: DC | PRN
  Administered 2020-08-02: 2 mg via INTRAVENOUS

## 2020-08-02 MED ORDER — CHLORHEXIDINE GLUCONATE 0.12 % MT SOLN
OROMUCOSAL | Status: AC
  Administered 2020-08-02: 15 mL via OROMUCOSAL
  Filled 2020-08-02: qty 15

## 2020-08-02 MED ORDER — FENTANYL CITRATE (PF) 100 MCG/2ML IJ SOLN
INTRAMUSCULAR | Status: AC
  Filled 2020-08-02: qty 2

## 2020-08-02 MED ORDER — CHLORHEXIDINE GLUCONATE 0.12 % MT SOLN: 15.0000 mL | Freq: Once | OROMUCOSAL | Status: AC

## 2020-08-02 MED ORDER — ACETAMINOPHEN 10 MG/ML IV SOLN
INTRAVENOUS | Status: DC | PRN
  Administered 2020-08-02: 1000 mg via INTRAVENOUS

## 2020-08-02 MED ORDER — CEFAZOLIN SODIUM-DEXTROSE 2-4 GM/100ML-% IV SOLN
INTRAVENOUS | Status: AC
  Filled 2020-08-02: qty 100

## 2020-08-02 MED ORDER — ONDANSETRON HCL 4 MG/2ML IJ SOLN
INTRAMUSCULAR | Status: DC | PRN
  Administered 2020-08-02: 4 mg via INTRAVENOUS

## 2020-08-02 MED ORDER — ACETAMINOPHEN 650 MG RE SUPP
650.0000 mg | RECTAL | Status: DC | PRN
  Filled 2020-08-02: qty 1

## 2020-08-02 MED ORDER — OXYCODONE-ACETAMINOPHEN 5-325 MG PO TABS: 1.0000 | ORAL_TABLET | ORAL | Status: DC | PRN

## 2020-08-02 MED ORDER — ACETAMINOPHEN 10 MG/ML IV SOLN
INTRAVENOUS | Status: AC
  Filled 2020-08-02: qty 100

## 2020-08-02 MED ORDER — LIDOCAINE HCL (PF) 2 % IJ SOLN
INTRAMUSCULAR | Status: AC
  Filled 2020-08-02: qty 5

## 2020-08-02 MED ORDER — PHENYLEPHRINE HCL (PRESSORS) 10 MG/ML IV SOLN
INTRAVENOUS | Status: AC
  Filled 2020-08-02: qty 1

## 2020-08-02 MED ORDER — OXYCODONE HCL 5 MG PO TABS: 5.0000 mg | ORAL_TABLET | Freq: Once | ORAL | Status: AC | PRN

## 2020-08-02 MED ORDER — PROPOFOL 500 MG/50ML IV EMUL
INTRAVENOUS | Status: DC | PRN
  Administered 2020-08-02: 50 ug/kg/min via INTRAVENOUS

## 2020-08-02 MED ORDER — PROPOFOL 500 MG/50ML IV EMUL
INTRAVENOUS | Status: AC
  Filled 2020-08-02: qty 50

## 2020-08-02 SURGICAL SUPPLY — 58 items
ADH SKN CLS APL DERMABOND .7 (GAUZE/BANDAGES/DRESSINGS) ×1
APL PRP STRL LF DISP 70% ISPRP (MISCELLANEOUS) ×1
APL SRG 38 LTWT LNG FL B (MISCELLANEOUS)
APPLICATOR ARISTA FLEXITIP XL (MISCELLANEOUS) IMPLANT
BAG DRN RND TRDRP ANRFLXCHMBR (UROLOGICAL SUPPLIES) ×1
BAG URINE DRAIN 2000ML AR STRL (UROLOGICAL SUPPLIES) ×2 IMPLANT
BLADE SURG SZ11 CARB STEEL (BLADE) ×2 IMPLANT
CANISTER SUCT 1200ML W/VALVE (MISCELLANEOUS) ×2 IMPLANT
CATH FOLEY 2WAY  5CC 16FR (CATHETERS) ×2
CATH FOLEY 2WAY 5CC 16FR (CATHETERS) ×1
CATH URTH 16FR FL 2W BLN LF (CATHETERS) ×1 IMPLANT
CHLORAPREP W/TINT 26 (MISCELLANEOUS) ×2 IMPLANT
COVER WAND RF STERILE (DRAPES) ×2 IMPLANT
DEFOGGER SCOPE WARMER CLEARIFY (MISCELLANEOUS) ×2 IMPLANT
DERMABOND ADVANCED (GAUZE/BANDAGES/DRESSINGS) ×1
DERMABOND ADVANCED .7 DNX12 (GAUZE/BANDAGES/DRESSINGS) ×1 IMPLANT
DEVICE SUTURE ENDOST 10MM (ENDOMECHANICALS) ×1 IMPLANT
DRAPE CAMERA CLOSED 9X96 (DRAPES) ×1 IMPLANT
DRSG TEGADERM 2-3/8X2-3/4 SM (GAUZE/BANDAGES/DRESSINGS) ×4 IMPLANT
GAUZE 4X4 16PLY RFD (DISPOSABLE) ×2 IMPLANT
GLOVE BIO SURGEON STRL SZ8 (GLOVE) ×12 IMPLANT
GLOVE INDICATOR 8.0 STRL GRN (GLOVE) ×8 IMPLANT
GOWN STRL REUS W/ TWL LRG LVL3 (GOWN DISPOSABLE) ×4 IMPLANT
GOWN STRL REUS W/ TWL XL LVL3 (GOWN DISPOSABLE) ×2 IMPLANT
GOWN STRL REUS W/TWL LRG LVL3 (GOWN DISPOSABLE) ×8
GOWN STRL REUS W/TWL XL LVL3 (GOWN DISPOSABLE) ×4
GRASPER SUT TROCAR 14GX15 (MISCELLANEOUS) ×2 IMPLANT
HEMOSTAT ARISTA ABSORB 3G PWDR (HEMOSTASIS) IMPLANT
IRRIGATION STRYKERFLOW (MISCELLANEOUS) ×1 IMPLANT
IRRIGATOR STRYKERFLOW (MISCELLANEOUS) ×2
IV LACTATED RINGERS 1000ML (IV SOLUTION) ×4 IMPLANT
KIT PINK PAD W/HEAD ARE REST (MISCELLANEOUS) ×2
KIT PINK PAD W/HEAD ARM REST (MISCELLANEOUS) ×1 IMPLANT
KIT TURNOVER CYSTO (KITS) ×2 IMPLANT
LABEL OR SOLS (LABEL) ×2 IMPLANT
MANIFOLD NEPTUNE II (INSTRUMENTS) ×2 IMPLANT
MANIPULATOR VCARE SML CRV RETR (MISCELLANEOUS) ×1 IMPLANT
NEEDLE VERESS 14GA 120MM (NEEDLE) ×2 IMPLANT
NS IRRIG 500ML POUR BTL (IV SOLUTION) ×2 IMPLANT
OCCLUDER COLPOPNEUMO (BALLOONS) ×2 IMPLANT
PACK GYN LAPAROSCOPIC (MISCELLANEOUS) ×2 IMPLANT
PAD OB MATERNITY 4.3X12.25 (PERSONAL CARE ITEMS) ×2 IMPLANT
PAD PREP 24X41 OB/GYN DISP (PERSONAL CARE ITEMS) ×2 IMPLANT
PORT ACCESS TROCAR AIRSEAL 12 (TROCAR) ×1 IMPLANT
PORT ACCESS TROCAR AIRSEAL 5M (TROCAR) ×1
SCISSORS METZENBAUM CVD 33 (INSTRUMENTS) ×1 IMPLANT
SET CYSTO W/LG BORE CLAMP LF (SET/KITS/TRAYS/PACK) ×2 IMPLANT
SET TRI-LUMEN FLTR TB AIRSEAL (TUBING) ×2 IMPLANT
SHEARS HARMONIC ACE PLUS 36CM (ENDOMECHANICALS) ×2 IMPLANT
SLEEVE ENDOPATH XCEL 5M (ENDOMECHANICALS) ×2 IMPLANT
SPONGE GAUZE 2X2 8PLY STRL LF (GAUZE/BANDAGES/DRESSINGS) ×3 IMPLANT
SUT ENDO VLOC 180-0-8IN (SUTURE) ×1 IMPLANT
SUT VIC AB 0 CT1 36 (SUTURE) ×2 IMPLANT
SUT VIC AB 4-0 FS2 27 (SUTURE) ×2 IMPLANT
SYR 10ML LL (SYRINGE) ×2 IMPLANT
SYR 50ML LL SCALE MARK (SYRINGE) ×2 IMPLANT
TROCAR PORT AIRSEAL 8X100 (TROCAR) ×2 IMPLANT
TROCAR XCEL NON-BLD 5MMX100MML (ENDOMECHANICALS) ×2 IMPLANT

## 2020-08-02 NOTE — OR Nursing (Signed)
Instructed patient with use of incentive spirometer with return demonstration 

## 2020-08-02 NOTE — Transfer of Care (Signed)
Immediate Anesthesia Transfer of Care Note  Patient: Karen Stephenson  Procedure(s) Performed: TOTAL LAPAROSCOPIC HYSTERECTOMY WITH BILATERAL SALPINGO OOPHORECTOMY (Bilateral )  Patient Location: PACU  Anesthesia Type:General  Level of Consciousness: awake and alert   Airway & Oxygen Therapy: Patient Spontanous Breathing and Patient connected to face mask  Post-op Assessment: Report given to RN  Post vital signs: Reviewed  Last Vitals:  Vitals Value Taken Time  BP 150/80 08/02/20 0930  Temp 36.2 C 08/02/20 0926  Pulse 80 08/02/20 0932  Resp 19 08/02/20 0932  SpO2 95 % 08/02/20 0932  Vitals shown include unvalidated device data.  Last Pain:  Vitals:   08/02/20 0926  TempSrc:   PainSc: Asleep         Complications: No complications documented.

## 2020-08-02 NOTE — OR Nursing (Signed)
Patient voided 75 ml, bladder scanned after with approx 75 ml in bladder.  Patient up to bathroom and voided again and feels empty.  Patient desires discharge at this time.

## 2020-08-02 NOTE — Op Note (Signed)
Operative Report:  PRE-OP DIAGNOSIS: uterine prolapse N81.4 post-menopausal bleeding N95.0   POST-OP DIAGNOSIS: uterine prolapse N81.4 post-menopausal bleeding N95.0   PROCEDURE: Procedure(s): TOTAL LAPAROSCOPIC HYSTERECTOMY WITH BILATERAL SALPINGO OOPHORECTOMY CYSTOSCOPY  SURGEON: Barnett Applebaum, MD, FACOG  ASSISTANT: RNFA   ANESTHESIA: General endotracheal anesthesia  ESTIMATED BLOOD LOSS: 60 mL  SPECIMENS: Uterus, Tubes, Ovaries.  COMPLICATIONS: None  DISPOSITION: stable to PACU  FINDINGS: Intraabdominal adhesions were not noted.   PROCEDURE:  . The patient was taken to the OR where anesthesia was administed. She was prepped and draped in the normal sterile fashion in the dorsal lithotomy position in the [place] stirrups. A time out was performed. A Graves speculum was inserted, the cervix was grasped with a single tooth tenaculum and the endometrial cavity was sounded. The cervix was progressively dilated to a size 18 [place] with [employer] dilators. A V-Care uterine manipulator was inserted in the usual fashion without incident. Gloves were changed and attention was turned to the abdomen.   . An infraumbilical transverse 43mm skin incision was made with the scalpel after local anesthesia applied to the skin. A Veress-step needle was inserted in the usual fashion and confirmed using the hanging drop technique. A pneumoperitoneum was obtained by insufflation of CO2 (opening pressure of 36mmHg) to 45mmHg. An 8 mm trocar is then placed under direct visualization with the laparoscope. A diagnostic laparoscopy was performed yielding the previously described findings. Attention was turned to the left lower quadrant where after visualization of the inferior epigastric vessels a 23mm skin incision was made with the scalpel. A 5 mm laparoscopic port was inserted. The same procedure was repeated in the right lower quadrant with a 93mm trocar.  For better visualization, a LUQ palmers point 9mm trocar is  placed under direct surveillance w the laparoscope. Attention was turned to the left aspect of the uterus, where after visualization of the ureter, the round ligament was coagulated and transected using the 70mm Harmonic Scapel. The anterior and posterior leafs of the broad ligament were dissected off as the anterior one was coagulated and transected in a caudal direction towards the cuff of the uterine manipulator.  Attention was then turned to the left fallopian tube and ovary which was recognized by visualization of the fimbria. The infundibulopelvic ligament and its blood vessels were carefully coagulated and transected using the Harmonic scapel.  Attention was turned to the right aspect of the uterus where the same procedure was performed.  The vesicouterine reflection of the peritoneum was dissected with the harmonic scapel and the bladder flap was created bluntly.  The uterine vessels were coagulated and transected bilaterally using first bipolar cautery and then the harmonic scapel. A 360 degree, circumferential colpotomy was done to completely amputate the uterus with cervix and tubes. Once the specimen was amputated it was delivered through the vagina.   . The colpotomy was repaired in a simple running fashion using a delayed absorbable suture with an endo-stitch device.  Vaginal exam confirms complete closure.  The cavity was copiously irrigated. A survey of the pelvic cavity revealed adequate hemostasis and no injury to bowel, bladder, or ureter.  The umbilical incision is closed (fascia) with a 0 Vicryl suture using a fascia closure device.   . A diagnostic cystoscopy was performed using saline distension of bladder with no lesions or injuries noted.  Bilateral urine flow from each ureteral orifice is visualized.  . At this point the procedure was finalized. All the instruments were removed from the patient's body. Gas  was expelled and patient is leveled.  Incisions are closed with skin adhesive.     . Patient goes to recovery room in stable condition.  All sponge, instrument, and needle counts are correct x2.     Barnett Applebaum, MD, [facility], [place] . 08/02/2020  9:23 AM

## 2020-08-02 NOTE — Anesthesia Postprocedure Evaluation (Signed)
Anesthesia Post Note  Patient: Karen Stephenson  Procedure(s) Performed: TOTAL LAPAROSCOPIC HYSTERECTOMY WITH BILATERAL SALPINGO OOPHORECTOMY (Bilateral )  Patient location during evaluation: PACU Anesthesia Type: General Level of consciousness: awake Pain management: pain level controlled Vital Signs Assessment: post-procedure vital signs reviewed and stable Respiratory status: spontaneous breathing Cardiovascular status: blood pressure returned to baseline Postop Assessment: no apparent nausea or vomiting Anesthetic complications: no   No complications documented.   Last Vitals:  Vitals:   08/02/20 1203 08/02/20 1319  BP: (!) 173/89 (!) 142/69  Pulse: 68 79  Resp: 16 16  Temp: (!) 36.2 C 36.4 C  SpO2: 98% 98%    Last Pain:  Vitals:   08/02/20 1319  TempSrc: Temporal  PainSc: 3                  Neva Seat

## 2020-08-02 NOTE — Discharge Instructions (Signed)
AMBULATORY SURGERY  DISCHARGE INSTRUCTIONS   1) The drugs that you were given will stay in your system until tomorrow so for the next 24 hours you should not:  A) Drive an automobile B) Make any legal decisions C) Drink any alcoholic beverage   2) You may resume regular meals tomorrow.  Today it is better to start with liquids and gradually work up to solid foods.  You may eat anything you prefer, but it is better to start with liquids, then soup and crackers, and gradually work up to solid foods.   3) Please notify your doctor immediately if you have any unusual bleeding, trouble breathing, redness and pain at the surgery site, drainage, fever, or pain not relieved by medication.    4) Additional Instructions:        Please contact your physician with any problems or Same Day Surgery at [phone], Monday through Friday 6 am to 4 pm, or [place] at [facility] number at [phone]. Total Laparoscopic Hysterectomy, Care After The following information offers guidance on how to care for yourself after your procedure. Your health care provider may also give you more specific instructions. If you have problems or questions, contact your health care provider. What can I expect after the procedure? After the procedure, it is common to have:  Pain, bruising, and numbness around your incisions.  Tiredness (fatigue).  Poor appetite.  Less interest in sex.  Vaginal discharge or bleeding. You will need to use a sanitary pad after this procedure.  Feelings of sadness or other emotions. If your ovaries were also removed, it is also common to have symptoms of menopause, such as hot flashes, night sweats, and lack of sleep (insomnia). Follow these instructions at home: Medicines  Take over-the-counter and prescription medicines only as told by your health care provider.  Ask your health care provider if the medicine prescribed to you: ? Requires you to avoid driving or  using machinery. ? Can cause constipation. You may need to take these actions to prevent or treat constipation:  Drink enough fluid to keep your urine pale yellow.  Take over-the-counter or prescription medicines.  Eat foods that are high in fiber, such as beans, whole grains, and fresh fruits and vegetables.  Limit foods that are high in fat and processed sugars, such as fried or sweet foods. Incision care  Follow instructions from your health care provider about how to take care of your incisions. Make sure you: ? Wash your hands with soap and water for at least 20 seconds before and after you change your bandage (dressing). If soap and water are not available, use hand sanitizer. ? Change your dressing as told by your health care provider. ? Leave stitches (sutures), skin glue, or adhesive strips in place. These skin closures may need to stay in place for 2 weeks or longer. If adhesive strip edges start to loosen and curl up, you may trim the loose edges. Do not remove adhesive strips completely unless your health care provider tells you to do that.  Check your incision areas every day for signs of infection. Check for: ? More redness, swelling, or pain. ? Fluid or blood. ? Warmth. ? Pus or a bad smell.   Activity  Rest as told by your health care provider.  Avoid sitting for a long time without moving. Get up to take short walks every 1-2 hours. This is important to improve blood flow and breathing. Ask for help if you feel weak  Return to your normal activities as told by your health care provider. Ask your health care provider what activities are safe for you.  Do not lift anything that is heavier than 10 lb (4.5 kg), or the limit that you are told, for one month after surgery or until your health care provider says that it is safe.  If you were given a sedative during the procedure, it can affect you for several hours. Do not drive or operate machinery until your  health care provider says that it is safe.   Lifestyle  Do not use any products that contain nicotine or tobacco. These products include cigarettes, chewing tobacco, and vaping devices, such as e-cigarettes. These can delay healing after surgery. If you need help quitting, ask your health care provider.  Do not drink alcohol until your health care provider approves. General instructions  Do not douche, use tampons, or have sex for at least 6 weeks, or as told by your health care provider.  If you struggle with physical or emotional changes after your procedure, speak with your health care provider or a therapist.  Do not take baths, swim, or use a hot tub until your health care provider approves. You may only be allowed to take showers for 2-3 weeks.  Keep your dressing dry until your health care provider says it can be removed.  Try to have someone at home with you for the first 1-2 weeks to help with your daily chores.  Wear compression stockings as told by your health care provider. These stockings help to prevent blood clots and reduce swelling in your legs.  Keep all follow-up visits. This is important.   Contact a health care provider if:  You have any of these signs of infection: ? Chills or a fever. ? More redness, swelling, or pain around an incision. ? Fluid or blood coming from an incision. ? Warmth coming from an incision. ? Pus or a bad smell coming from an incision.  An incision opens.  You feel dizzy or light-headed.  You have pain or bleeding when you urinate, or you are unable to urinate.  You have abnormal vaginal discharge.  You have pain that does not get better with medicine. Get help right away if:  You have a fever and your symptoms suddenly get worse.  You have severe abdominal pain.  You have chest pain or shortness of breath.  You faint.  You have pain, swelling, or redness in your leg.  You have heavy vaginal bleeding with blood clots,  soaking through a sanitary pad in less than 1 hour. These symptoms may represent a serious problem that is an emergency. Do not wait to see if the symptoms will go away. Get medical help right away. Call your local emergency services (911 in the [place] Do not drive yourself to the hospital. Summary  After the procedure, it is common to have pain and bruising around your incisions.  Do not take baths, swim, or use a hot tub until your health care provider approves.  Do not lift anything that is heavier than 10 lb (4.5 kg), or the limit that you are told, for one month after surgery or until your health care provider says that it is safe.  Tell your health care provider if you have any signs or symptoms of infection after the procedure.  Get help right away if you have severe abdominal pain, chest pain, shortness of breath, or heavy bleeding from your vagina. This   information is not intended to replace advice given to you by your health care provider. Make sure you discuss any questions you have with your health care provider. Document Revised: 02/19/2020 Document Reviewed: 02/19/2020 Elsevier Patient Education  2021 Elsevier Inc.  

## 2020-08-02 NOTE — Interval H&P Note (Signed)
History and Physical Interval Note:  08/02/2020 7:19 AM  Karen Stephenson  has presented today for surgery, with the diagnosis of uterine prolapse N81.4 post-menopausal bleeding N95.0.  The various methods of treatment have been discussed with the patient and family. After consideration of risks, benefits and other options for treatment, the patient has consented to  Procedure(s) with comments: [place] (Bilateral) as a surgical intervention.  The patient's history has been reviewed, patient examined, no change in status, stable for surgery.  I have reviewed the patient's chart and labs.  Questions were answered to the patient's satisfaction.     Hoyt Koch

## 2020-08-02 NOTE — Anesthesia Procedure Notes (Addendum)
Procedure Name: Intubation Date/Time: 08/02/2020 7:35 AM Performed by: Johney Maine, CRNA Pre-anesthesia Checklist: Patient identified, Patient being monitored, Timeout performed, Emergency Drugs available and Suction available Patient Re-evaluated:Patient Re-evaluated prior to induction Oxygen Delivery Method: Circle system utilized Preoxygenation: Pre-oxygenation with 100% oxygen Induction Type: IV induction Ventilation: Mask ventilation without difficulty Laryngoscope Size: Miller and 2 Grade View: Grade I Tube type: Oral Tube size: 7.0 mm Number of attempts: 1 Placement Confirmation: ETT inserted through vocal cords under direct vision,  positive ETCO2 and breath sounds checked- equal and bilateral Secured at: 21 cm Tube secured with: Tape Dental Injury: Teeth and Oropharynx as per pre-operative assessment

## 2020-08-03 ENCOUNTER — Encounter: Payer: Self-pay | Admitting: Family Medicine

## 2020-08-03 ENCOUNTER — Encounter: Payer: Self-pay | Admitting: Obstetrics & Gynecology

## 2020-08-03 LAB — SURGICAL PATHOLOGY

## 2020-08-10 ENCOUNTER — Other Ambulatory Visit: Payer: Self-pay | Admitting: Obstetrics & Gynecology

## 2020-08-16 ENCOUNTER — Other Ambulatory Visit: Payer: Self-pay | Admitting: Family Medicine

## 2020-08-16 DIAGNOSIS — I10 Essential (primary) hypertension: Secondary | ICD-10-CM

## 2020-08-18 ENCOUNTER — Ambulatory Visit (INDEPENDENT_AMBULATORY_CARE_PROVIDER_SITE_OTHER): Payer: Medicare PPO | Admitting: Obstetrics & Gynecology

## 2020-08-18 ENCOUNTER — Other Ambulatory Visit: Payer: Self-pay

## 2020-08-18 ENCOUNTER — Encounter: Payer: Self-pay | Admitting: Obstetrics & Gynecology

## 2020-08-18 VITALS — BP 130/80 | Ht 68.0 in | Wt 261.0 lb

## 2020-08-18 DIAGNOSIS — Z90722 Acquired absence of ovaries, bilateral: Secondary | ICD-10-CM

## 2020-08-18 DIAGNOSIS — Z90710 Acquired absence of both cervix and uterus: Secondary | ICD-10-CM | POA: Insufficient documentation

## 2020-08-18 HISTORY — DX: Acquired absence of both cervix and uterus: Z90.710

## 2020-08-18 HISTORY — DX: Acquired absence of ovaries, bilateral: Z90.722

## 2020-08-18 NOTE — Progress Notes (Addendum)
  Postoperative Follow-up Patient presents post op from [facility] BSO for fibroids and pelvic relaxation, 2 weeks ago. Path: DIAGNOSIS:  A. UTERUS WITH CERVIX, BILATERAL FALLOPIAN TUBES, AND OVARIES; TOTAL  HYSTERECTOMY WITH BILATERAL SALPINGO-OOPHORECTOMY:  - UTERINE CERVIX:    - BENIGN TRANSFORMATION ZONE.    - NEGATIVE FOR SQUAMOUS INTRAEPITHELIAL LESION AND MALIGNANCY.  - ENDOMETRIUM:    - BENIGN ENDOMETRIAL POLYP.    - DISORDERED PROLIFERATIVE ENDOMETRIUM.    - NEGATIVE FOR ATYPICAL HYPERPLASIA/EIN AND MALIGNANCY.  - MYOMETRIUM:    - LEIOMYOMATA UTERI.    - NEGATIVE FOR FEATURES OF MALIGNANCY.  - FALLOPIAN TUBES:    - NO SIGNIFICANT HISTOPATHOLOGIC CHANGE.  - OVARIES:    - BENIGN PHYSIOLOGIC CHANGES.  Subjective: Patient reports marked improvement in her preop symptoms. Eating a regular diet without difficulty. The patient is not having any pain.  Activity: normal activities of daily living. Patient reports additional symptom's since surgery of None.  Objective: BP 130/80   Ht 5\' 8"  (1.727 m)   Wt 261 lb (118.4 kg)   BMI 39.68 kg/m  Physical Exam Constitutional:      General: She is not in acute distress.    Appearance: She is well-developed and well-nourished.  Cardiovascular:     Rate and Rhythm: Normal rate.  Pulmonary:     Effort: Pulmonary effort is normal.  Abdominal:     General: There is no distension.     Palpations: Abdomen is soft.     Tenderness: There is no abdominal tenderness.     Comments: Incision Healing Well   Musculoskeletal:        General: Normal range of motion.  Neurological:     Mental Status: She is alert and oriented to person, place, and time.     Cranial Nerves: No cranial nerve deficit.  Skin:    General: Skin is warm and dry.  Psychiatric:        Mood and Affect: Mood and affect normal.     Assessment: s/p :  [facility] BSO progressing well  Plan: Patient has done well after surgery with no apparent  complications.  I have discussed the post-operative course to date, and the expected progress moving forward.  The patient understands what complications to be concerned about.  I will see the patient in routine follow up, or sooner if needed.    Activity plan: No restriction except for Pelvic rest.  Hoyt Koch 08/18/2020, 2:07 PM

## 2020-08-24 ENCOUNTER — Ambulatory Visit: Payer: Medicare PPO | Admitting: Podiatry

## 2020-08-24 ENCOUNTER — Other Ambulatory Visit: Payer: Self-pay

## 2020-08-24 ENCOUNTER — Encounter: Payer: Self-pay | Admitting: Podiatry

## 2020-08-24 DIAGNOSIS — L97521 Non-pressure chronic ulcer of other part of left foot limited to breakdown of skin: Secondary | ICD-10-CM | POA: Diagnosis not present

## 2020-08-24 DIAGNOSIS — E1142 Type 2 diabetes mellitus with diabetic polyneuropathy: Secondary | ICD-10-CM | POA: Diagnosis not present

## 2020-08-24 DIAGNOSIS — M79676 Pain in unspecified toe(s): Secondary | ICD-10-CM

## 2020-08-24 DIAGNOSIS — B351 Tinea unguium: Secondary | ICD-10-CM | POA: Diagnosis not present

## 2020-08-24 NOTE — Progress Notes (Signed)
She presents today for follow-up of her ulcerations left hallux reactive hyperkeratotic lesions plantar aspect of the bilateral foot.  Is also complaining of painful toenails.  Has a history of diabetes mellitus diabetic peripheral neuropathy and history of diabetic ulceration.  Objective: Vital signs stable oriented x3 pulses are palpable.  Ulceration to the left hallux has diminished considerably no open wound at this time.  Multiple reactive hyper keratomas plantar aspect of bilateral foot no open lesions or wounds.  Toenails are long thick yellow dystrophic clinical mycotic.  Assessment: Pain in limb secondary to diabetic peripheral neuropathy resolving ulceration hallux left painful benign skin lesions bilaterally and painful mycotic nails.  Plan: Debridement of all reactive hyperkeratotic tissue including ulceration which I did redress today.  Also debridement of toenails 1 through 5 bilateral.  Follow-up with me in 1 month to 6 weeks.

## 2020-08-30 ENCOUNTER — Ambulatory Visit (INDEPENDENT_AMBULATORY_CARE_PROVIDER_SITE_OTHER): Payer: Medicare PPO

## 2020-08-30 ENCOUNTER — Telehealth: Payer: Self-pay

## 2020-08-30 ENCOUNTER — Other Ambulatory Visit: Payer: Self-pay | Admitting: Obstetrics and Gynecology

## 2020-08-30 ENCOUNTER — Other Ambulatory Visit: Payer: Self-pay

## 2020-08-30 ENCOUNTER — Other Ambulatory Visit: Payer: Self-pay | Admitting: Family Medicine

## 2020-08-30 DIAGNOSIS — J302 Other seasonal allergic rhinitis: Secondary | ICD-10-CM

## 2020-08-30 DIAGNOSIS — E119 Type 2 diabetes mellitus without complications: Secondary | ICD-10-CM

## 2020-08-30 DIAGNOSIS — R300 Dysuria: Secondary | ICD-10-CM

## 2020-08-30 LAB — POCT URINALYSIS DIPSTICK
Bilirubin, UA: NEGATIVE
Blood, UA: POSITIVE
Glucose, UA: NEGATIVE
Ketones, UA: NEGATIVE
Nitrite, UA: NEGATIVE
Protein, UA: NEGATIVE
Spec Grav, UA: 1.01 (ref 1.010–1.025)
Urobilinogen, UA: 0.2 E.U./dL
pH, UA: 5 (ref 5.0–8.0)

## 2020-08-30 NOTE — Telephone Encounter (Signed)
Didn't call anything in look like she has multiple antibiotics allergies so figured worth while waiting for the culture

## 2020-08-30 NOTE — Telephone Encounter (Signed)
Pt aware.

## 2020-08-30 NOTE — Telephone Encounter (Signed)
Pt dropped off urine sample has blood and leukocytes, I put order in for urine culture, can you send in Rx for pt since [facility] is not in the office ?

## 2020-08-31 NOTE — Telephone Encounter (Signed)
Patient inquiring about urine culture results. Cb#[phone] or [phone]

## 2020-08-31 NOTE — Telephone Encounter (Signed)
Spoke w/patient. Advised Urine cultures take at least 48 hours.

## 2020-09-02 ENCOUNTER — Other Ambulatory Visit: Payer: Self-pay | Admitting: Obstetrics & Gynecology

## 2020-09-02 LAB — URINE CULTURE

## 2020-09-02 MED ORDER — CIPROFLOXACIN HCL 500 MG PO TABS: 500.0000 mg | ORAL_TABLET | Freq: Two times a day (BID) | ORAL | 0 refills | Status: DC

## 2020-09-02 NOTE — Progress Notes (Signed)
Let her know UTI by culture, and an antibiotic has been called in for her

## 2020-09-02 NOTE — Progress Notes (Signed)
Left message to advise of rx and uti

## 2020-09-07 ENCOUNTER — Other Ambulatory Visit: Payer: Self-pay | Admitting: Obstetrics & Gynecology

## 2020-09-21 ENCOUNTER — Encounter: Payer: Self-pay | Admitting: Obstetrics & Gynecology

## 2020-09-21 ENCOUNTER — Ambulatory Visit (INDEPENDENT_AMBULATORY_CARE_PROVIDER_SITE_OTHER): Payer: Medicare PPO | Admitting: Obstetrics & Gynecology

## 2020-09-21 ENCOUNTER — Other Ambulatory Visit: Payer: Self-pay

## 2020-09-21 VITALS — BP 120/80 | Ht 68.0 in | Wt 265.0 lb

## 2020-09-21 DIAGNOSIS — Z90710 Acquired absence of both cervix and uterus: Secondary | ICD-10-CM

## 2020-09-21 DIAGNOSIS — Z90722 Acquired absence of ovaries, bilateral: Secondary | ICD-10-CM

## 2020-09-21 NOTE — Patient Instructions (Signed)
  Mammogram every year    Call [phone] to schedule at [facility]   Thank you for choosing [facility]. As part of our ongoing efforts to improve patient experience, we would appreciate your feedback. Please fill out the short survey that you will receive by mail or MyChart. Your opinion is important to [place]! - Dr. Kenton Kingfisher

## 2020-09-21 NOTE — Progress Notes (Signed)
  Postoperative Follow-up Patient presents post op from [facility] BSO for fibroids and pelvic relaxation, 6 weeks ago.  Subjective: Patient reports marked improvement in her preop symptoms. Eating a regular diet without difficulty. The patient is not having any pain.  Activity: normal activities of daily living. Patient reports additional symptom's since surgery of None.  Objective: BP 120/80   Ht 5\' 8"  (1.727 m)   Wt 265 lb (120.2 kg)   BMI 40.29 kg/m  Physical Exam Constitutional:      General: She is not in acute distress.    Appearance: She is well-developed.  Genitourinary:     Rectum normal.     Genitourinary Comments: Cervix and uterus absent. Vaginal cuff healing well.     No vaginal erythema or bleeding.      Right Adnexa: not tender and no mass present.    Left Adnexa: not tender and no mass present.    Cervix is absent.     Uterus is absent.  Cardiovascular:     Rate and Rhythm: Normal rate.  Pulmonary:     Effort: Pulmonary effort is normal.  Abdominal:     General: There is no distension.     Palpations: Abdomen is soft.     Tenderness: There is no abdominal tenderness.     Comments: Incision healing well.  Musculoskeletal:        General: Normal range of motion.  Neurological:     Mental Status: She is alert and oriented to person, place, and time.     Cranial Nerves: No cranial nerve deficit.  Skin:    General: Skin is warm and dry.     Assessment: s/p :  [facility] BSO progressing well  Plan: Patient has done well after surgery with no apparent complications.  I have discussed the post-operative course to date, and the expected progress moving forward.  The patient understands what complications to be concerned about.  I will see the patient in routine follow up, or sooner if needed.    Activity plan: No restriction. MMG soon  Hoyt Koch 09/21/2020, 10:41 AM

## 2020-10-05 ENCOUNTER — Other Ambulatory Visit: Payer: Self-pay

## 2020-10-05 ENCOUNTER — Ambulatory Visit: Payer: Medicare PPO | Admitting: Podiatry

## 2020-10-05 ENCOUNTER — Encounter: Payer: Self-pay | Admitting: Podiatry

## 2020-10-05 DIAGNOSIS — L97521 Non-pressure chronic ulcer of other part of left foot limited to breakdown of skin: Secondary | ICD-10-CM

## 2020-10-05 DIAGNOSIS — E1142 Type 2 diabetes mellitus with diabetic polyneuropathy: Secondary | ICD-10-CM

## 2020-10-05 DIAGNOSIS — B351 Tinea unguium: Secondary | ICD-10-CM

## 2020-10-05 DIAGNOSIS — M79676 Pain in unspecified toe(s): Secondary | ICD-10-CM

## 2020-10-05 NOTE — Progress Notes (Signed)
She presents today for follow-up of her ulcerations to the hallux bilaterally and subfifth bilaterally.  She is also requesting nail care.  Objective: Vital signs are stable she is alert oriented x3 there is no erythema edema/drainage or odor reactive hyperkeratosis along the plantar medial and medial aspect of the hallux interphalangeal joints are just that there is hyperkeratotic no open lesions or wounds today.  She does have long thick yellow dystrophic-like mycotic nails that are thickened and painful with debridement.  Assessment: Diabetes mellitus with diabetic peripheral neuropathy history of ulceration hallux bilaterally.  Pain limb secondary to onychomycosis.  Plan: Debrided all reactive hyperkeratotic tissue benign keratotic tissues denies skin lesion and debrided toenails 1 through 5 bilateral.

## 2020-10-07 ENCOUNTER — Other Ambulatory Visit: Payer: Self-pay | Admitting: Family Medicine

## 2020-10-07 DIAGNOSIS — J302 Other seasonal allergic rhinitis: Secondary | ICD-10-CM

## 2020-10-07 NOTE — Telephone Encounter (Signed)
Requested Prescriptions  Pending Prescriptions Disp Refills  . fluticasone (FLONASE) 50 MCG/ACT nasal spray [Pharmacy Med Name: Fluticasone Propionate 50 MCG/ACT Nasal Suspension] 16 g 0    Sig: Use 2 spray(s) in each nostril once daily     Ear, Nose, and Throat: Nasal Preparations - Corticosteroids Passed - 10/07/2020  9:00 PM      Passed - Valid encounter within last 12 months    Recent Outpatient Visits          2 months ago Pre-op examination   [facility] Jerrol Banana., MD   2 months ago Cough   [facility] Jerrol Banana., MD   4 months ago Type 2 diabetes mellitus with diabetic polyneuropathy, without long-term current use of insulin [facility])   [facility] Jerrol Banana., MD   6 months ago Welcome to [employer] preventive visit   [facility] Jerrol Banana., MD   8 months ago Uterine prolapse   [facility] Jerrol Banana., MD      Future Appointments            In 5 days Jerrol Banana., MD [facility] [place]

## 2020-10-12 ENCOUNTER — Encounter: Payer: Self-pay | Admitting: Family Medicine

## 2020-10-12 ENCOUNTER — Other Ambulatory Visit: Payer: Self-pay

## 2020-10-12 ENCOUNTER — Ambulatory Visit: Payer: Medicare PPO | Admitting: Family Medicine

## 2020-10-12 VITALS — BP 125/84 | HR 77 | Temp 97.9°F | Resp 16 | Ht 68.0 in | Wt 264.0 lb

## 2020-10-12 DIAGNOSIS — E1142 Type 2 diabetes mellitus with diabetic polyneuropathy: Secondary | ICD-10-CM

## 2020-10-12 DIAGNOSIS — I10 Essential (primary) hypertension: Secondary | ICD-10-CM | POA: Diagnosis not present

## 2020-10-12 DIAGNOSIS — E7800 Pure hypercholesterolemia, unspecified: Secondary | ICD-10-CM | POA: Diagnosis not present

## 2020-10-12 LAB — POCT GLYCOSYLATED HEMOGLOBIN (HGB A1C)
Est. average glucose Bld gHb Est-mCnc: 189
Hemoglobin A1C: 8.2 % — AB (ref 4.0–5.6)

## 2020-10-12 MED ORDER — EMPAGLIFLOZIN 10 MG PO TABS
10.0000 mg | ORAL_TABLET | Freq: Every day | ORAL | 3 refills | Status: DC
Start: 2020-10-12 — End: 2021-10-26

## 2020-10-12 NOTE — Progress Notes (Signed)
I,[name],acting as a scribe for Karen Durie, MD.,have documented all relevant documentation on the behalf of Karen Durie, MD,as directed by  Karen Durie, MD while in the presence of Karen Durie, MD.   Established patient visit   Patient: Karen Stephenson   DOB: 12-01-1955   65 y.o. Female  MRN: [id number] Visit Date: 10/12/2020  Today's healthcare provider: [name], MD   Chief Complaint  Patient presents with  . Follow-up  . Diabetes  . Hypertension   Subjective    HPI  Patient has been feeling fairly well recently.  She has no significant complaints.  Diabetes Mellitus Type II, follow-up  Lab Results  Component Value Date   HGBA1C 8.4 (H) 06/08/2020   HGBA1C 7.3 (A) 01/27/2020   HGBA1C 7.7 (A) 09/23/2019   Last seen for diabetes 4 months ago.  Management since then includes; On Precose, new VA, and Jardiance.  Patient states she is not taking the Jardiance. She reports good compliance with treatment. She is not having side effects. none  Home blood sugar records: fasting range: not checking  Episodes of hypoglycemia? No none   Current insulin regiment: n/a Most Recent Eye Exam: due  --------------------------------------------------------------------  Hypertension, follow-up  BP Readings from Last 3 Encounters:  10/12/20 125/84  09/21/20 120/80  08/18/20 130/80   Wt Readings from Last 3 Encounters:  10/12/20 264 lb (119.7 kg)  09/21/20 265 lb (120.2 kg)  08/18/20 261 lb (118.4 kg)     She was last seen for hypertension 4 months ago.  BP at that visit was 149/75. Management since that visit includes; On HCTZ, losartan, She reports good compliance with treatment. She is not having side effects. none She is exercising. She is adherent to low salt diet.   Outside blood pressures are not checking.  She does not smoke.  Use of agents associated with hypertension: none.    --------------------------------------------------------------------       Medications: Outpatient Medications Prior to Visit  Medication Sig  . acarbose (PRECOSE) 25 MG tablet TAKE 1 TABLET BY MOUTH THREE TIMES DAILY WITH MEALS  . albuterol (VENTOLIN HFA) 108 (90 Base) MCG/ACT inhaler Inhale 2 puffs into the lungs every 6 (six) hours as needed for wheezing or shortness of breath.  Marland Kitchen aspirin EC 81 MG tablet Take 81 mg by mouth daily. Swallow whole.  Marland Kitchen BIOTIN PO Take 1 tablet by mouth daily.  . cadexomer iodine (IODOSORB) 0.9 % gel Apply 1 application topically daily.  . cetirizine (ZYRTEC) 10 MG tablet Take 10 mg by mouth daily.  . chlorpheniramine-HYDROcodone (TUSSIONEX PENNKINETIC ER) 10-8 MG/5ML SUER Take 5 mLs by mouth every 12 (twelve) hours as needed for cough.  . fluticasone (FLONASE) 50 MCG/ACT nasal spray Use 2 spray(s) in each nostril once daily  . Fluticasone-Salmeterol (ADVAIR) 250-50 MCG/DOSE AEPB INHALE ONE DOSE BY MOUTH AS NEEDED  . glucose blood test strip Check sugar once daily, E 11.9  . Homeopathic Products (LEG CRAMP RELIEF PO) Take 2 capsules by mouth daily.   . hydrochlorothiazide (HYDRODIURIL) 25 MG tablet Take 1 tablet by mouth once daily  . hyoscyamine (LEVBID) 0.375 MG 12 hr tablet Take 1 tablet (0.375 mg total) by mouth as needed.  Marland Kitchen JANUVIA 100 MG tablet Take 1 tablet by mouth once daily  . JARDIANCE 10 MG TABS tablet Take 1 tablet by mouth once daily  . Lancets (ONETOUCH ULTRASOFT) lancets Check sugar once daily ,DX E11.9  . losartan (  COZAAR) 100 MG tablet Take 1 tablet by mouth once daily  . montelukast (SINGULAIR) 10 MG tablet TAKE 1 TABLET BY MOUTH AT BEDTIME  . pantoprazole (PROTONIX) 40 MG tablet Take 1 tablet by mouth once daily  . rosuvastatin (CRESTOR) 20 MG tablet Take 1 tablet by mouth once daily  . vitamin B-12 (CYANOCOBALAMIN) 1000 MCG tablet Take 1,000 mcg by mouth daily.  . Vitamin D, Cholecalciferol, 50 MCG (2000 UT) CAPS Take 2,000 Units  by mouth daily.  Marland Kitchen zinc gluconate 50 MG tablet Take 50 mg by mouth daily.   No facility-administered medications prior to visit.    Review of Systems  Constitutional: Negative for appetite change, chills, fatigue and fever.  Respiratory: Negative for chest tightness and shortness of breath.   Cardiovascular: Negative for chest pain and palpitations.  Gastrointestinal: Negative for abdominal pain, nausea and vomiting.  Neurological: Negative for dizziness and weakness.        Objective    BP 125/84 (BP Location: Right Arm, Patient Position: Sitting, Cuff Size: Large)   Pulse 77   Temp 97.9 F (36.6 C) (Oral)   Resp 16   Ht 5\' 8"  (1.727 m)   Wt 264 lb (119.7 kg)   SpO2 97%   BMI 40.14 kg/m  BP Readings from Last 3 Encounters:  10/12/20 125/84  09/21/20 120/80  08/18/20 130/80   Wt Readings from Last 3 Encounters:  10/12/20 264 lb (119.7 kg)  09/21/20 265 lb (120.2 kg)  08/18/20 261 lb (118.4 kg)       Physical Exam Vitals and nursing note reviewed.  Constitutional:      Appearance: She is well-developed. She is obese.  HENT:     Head: Normocephalic and atraumatic.     Right Ear: External ear normal.     Left Ear: External ear normal.  Eyes:     General: No scleral icterus.    Conjunctiva/sclera: Conjunctivae normal.  Neck:     Thyroid: No thyromegaly.     Vascular: No carotid bruit.  Cardiovascular:     Rate and Rhythm: Normal rate and regular rhythm.     Pulses: Normal pulses.     Heart sounds: Normal heart sounds.  Pulmonary:     Effort: Pulmonary effort is normal.     Breath sounds: Normal breath sounds.  Chest:  Breasts:     Right: No swelling, bleeding, inverted nipple, mass, nipple discharge, skin change or tenderness.     Left: No swelling, bleeding, inverted nipple, mass, nipple discharge, skin change or tenderness.    Abdominal:     Palpations: Abdomen is soft.  Musculoskeletal:     Right lower leg: No edema.     Left lower leg: No edema.   Lymphadenopathy:     Cervical: No cervical adenopathy.  Skin:    General: Skin is warm and dry.  Neurological:     General: No focal deficit present.     Mental Status: She is alert and oriented to person, place, and time.  Psychiatric:        Mood and Affect: Mood normal.        Behavior: Behavior normal.        Thought Content: Thought content normal.        Judgment: Judgment normal.       No results found for any visits on 10/12/20.  Assessment & Plan     1. Type 2 diabetes mellitus with diabetic polyneuropathy, without long-term current use of insulin (Love)  A1c is gone from 8.4-8.2.  Restart Jardiance at 10 mg daily to try to get it below 7. - POCT glycosylated hemoglobin (Hb A1C)  2. Essential (primary) hypertension Blood pressure well controlled on losartan,-Jardiance will also help  3. Hypercholesteremia On rosuvastatin   No follow-ups on file.      I, Karen Durie, MD, have reviewed all documentation for this visit. The documentation on 10/20/20 for the exam, diagnosis, procedures, and orders are all accurate and complete.    Ziare Cryder Cranford Mon, MD  [facility] [phone] (phone) [phone] (fax)  [place]

## 2020-11-08 ENCOUNTER — Other Ambulatory Visit: Payer: Self-pay | Admitting: Family Medicine

## 2020-11-08 DIAGNOSIS — K219 Gastro-esophageal reflux disease without esophagitis: Secondary | ICD-10-CM

## 2020-11-08 DIAGNOSIS — J302 Other seasonal allergic rhinitis: Secondary | ICD-10-CM

## 2020-11-08 NOTE — Telephone Encounter (Signed)
Requested medications are due for refill today.  yes  Requested medications are on the active medications list.  yes  Last refill. 11/07/2019  Future visit scheduled.   yes  Notes to clinic.  Prescription is expired.

## 2020-11-08 NOTE — Telephone Encounter (Signed)
Requested Prescriptions  Pending Prescriptions Disp Refills  . fluticasone (FLONASE) 50 MCG/ACT nasal spray [Pharmacy Med Name: Fluticasone Propionate 50 MCG/ACT Nasal Suspension] 16 g 1    Sig: Use 2 spray(s) in each nostril once daily     Ear, Nose, and Throat: Nasal Preparations - Corticosteroids Passed - 11/08/2020  8:38 PM      Passed - Valid encounter within last 12 months    Recent Outpatient Visits          3 weeks ago Type 2 diabetes mellitus with diabetic polyneuropathy, without long-term current use of insulin [facility])   [facility] Jerrol Banana., MD   3 months ago Pre-op examination   [facility] Jerrol Banana., MD   3 months ago Cough   [facility] Jerrol Banana., MD   5 months ago Type 2 diabetes mellitus with diabetic polyneuropathy, without long-term current use of insulin [facility])   [facility] Jerrol Banana., MD   7 months ago [facility] [employer] preventive visit   [facility] MD      Future Appointments            In 3 months Jerrol Banana., MD [facility], [place]           . pantoprazole (PROTONIX) 40 MG tablet [Pharmacy Med Name: Pantoprazole Sodium 40 MG Oral Tablet Delayed Release] 90 tablet 0    Sig: Take 1 tablet by mouth once daily     Gastroenterology: Proton Pump Inhibitors Passed - 11/08/2020  8:38 PM      Passed - Valid encounter within last 12 months    Recent Outpatient Visits          3 weeks ago Type 2 diabetes mellitus with diabetic polyneuropathy, without long-term current use of insulin [facility])   [facility] Jerrol [facility]   3 months ago Pre-op examination   [facility] Jerrol [facility]   3 months ago Cough   [facility] Ban[facility]   5 months ago Type 2 diabetes mellitus with diabetic polyneuropathy, without  long-term current use of insulin [facility])   [facility] Jer[facility] MD   7 months ago Welcome to [employer] preventive visit   [facility] Jerrol Banana., MD      Future Appointments            In 3 months Jerrol Banana., MD [facility], [place]

## 2020-11-12 ENCOUNTER — Other Ambulatory Visit: Payer: Self-pay | Admitting: Family Medicine

## 2020-11-12 DIAGNOSIS — I10 Essential (primary) hypertension: Secondary | ICD-10-CM

## 2020-11-12 NOTE — Telephone Encounter (Signed)
Requested medication (s) are due for refill today: yes  Requested medication (s) are on the active medication list: yes  Last refill:  Losartan: 08/16/20 #90    HCTZ: 08/16/20 #90  Future visit scheduled: yes  Notes to clinic:  last potassium level on 08/02/20 below normal range   Requested Prescriptions  Pending Prescriptions Disp Refills   losartan (COZAAR) 100 MG tablet [Pharmacy Med Name: Losartan Potassium 100 MG Oral Tablet] 90 tablet 0    Sig: Take 1 tablet by mouth once daily      Cardiovascular:  Angiotensin Receptor Blockers Failed - 11/12/2020 12:57 PM      Failed - K in normal range and within 180 days    Potassium  Date Value Ref Range Status  08/02/2020 3.3 (L) 3.5 - 5.1 mmol/L Final          Passed - Cr in normal range and within 180 days    Creatinine, Ser  Date Value Ref Range Status  08/02/2020 1.00 0.44 - 1.00 mg/dL Final          Passed - Patient is not pregnant      Passed - Last BP in normal range    BP Readings from Last 1 Encounters:  10/12/20 125/84          Passed - Valid encounter within last 6 months    Recent Outpatient Visits           1 month ago Type 2 diabetes mellitus with diabetic polyneuropathy, without long-term current use of insulin ([place])   [facility]   3 months ago Pre-op examination   [facility]   4 months ago Cough   [facility] Jerrol Banana., MD   5 months ago Type 2 diabetes mellitus with diabetic polyneuropathy, without long-term current use of insulin [facility])   [facility] Jerrol Banana., MD   7 months ago Welcome to [employer] preventive visit   [facility] Jerrol Banana., MD       Future Appointments             In 3 months Jerrol Banana., MD [facility]               hydrochlorothiazide (HYDRODIURIL) 25 MG tablet [Pharmacy Med  Name: hydroCHLOROthiazide 25 MG Oral Tablet] 90 tablet 0    Sig: Take 1 tablet by mouth once daily      Cardiovascular: Diuretics - Thiazide Failed - 11/12/2020 12:57 PM      Failed - K in normal range and within 360 days    Potassium  Date Value Ref Range Status  08/02/2020 3.3 (L) 3.5 - 5.1 mmol/L Final          Passed - Ca in normal range and within 360 days    Calcium  Date Value Ref Range Status  06/08/2020 9.5 8.7 - 10.3 mg/dL Final   Calcium, Ion  Date Value Ref Range Status  08/02/2020 1.19 1.15 - 1.40 mmol/L Final          Passed - Cr in normal range and within 360 days    Creatinine, Ser  Date Value Ref Range Status  08/02/2020 1.00 0.44 - 1.00 mg/dL Final          Passed - Na in normal range and within 360 days    Sodium  Date Value Ref  Range Status  08/02/2020 142 135 - 145 mmol/L Final  06/08/2020 138 134 - 144 mmol/L Final          Passed - Last BP in normal range    BP Readings from Last 1 Encounters:  10/12/20 125/84          Passed - Valid encounter within last 6 months    Recent Outpatient Visits           1 month ago Type 2 diabetes mellitus with diabetic polyneuropathy, without long-term current use of insulin [facility])   [facility] Jerrol Banana., MD   3 months ago Pre-op examination   [facility] Jer[facility] Ban[facility]   4 months ago Cough   [facility] Jerrol Banana., MD   5 months ago Type 2 diabetes mellitus with diabetic polyneuropathy, without long-term current use of insulin [facility])   [facility] Jerrol Banana., MD   7 months ago Welcome to [employer] preventive visit   [facility] Jerrol Banana., MD       Future Appointments             In 3 months Jerrol Banana., MD [facility] [place]

## 2020-11-16 ENCOUNTER — Encounter: Payer: Self-pay | Admitting: Podiatry

## 2020-11-16 ENCOUNTER — Ambulatory Visit: Payer: Medicare PPO | Admitting: Podiatry

## 2020-11-16 ENCOUNTER — Other Ambulatory Visit: Payer: Self-pay

## 2020-11-16 DIAGNOSIS — M79676 Pain in unspecified toe(s): Secondary | ICD-10-CM | POA: Diagnosis not present

## 2020-11-16 DIAGNOSIS — L84 Corns and callosities: Secondary | ICD-10-CM | POA: Diagnosis not present

## 2020-11-16 DIAGNOSIS — B351 Tinea unguium: Secondary | ICD-10-CM

## 2020-11-16 DIAGNOSIS — E1142 Type 2 diabetes mellitus with diabetic polyneuropathy: Secondary | ICD-10-CM | POA: Diagnosis not present

## 2020-11-16 NOTE — Progress Notes (Signed)
She presents today for follow-up of her diabetes diabetic peripheral neuropathy her reactive hyperkeratosis which once led to ulceration in her toenails.  She states that quite honestly she is doing very well at this point.  She does relate that her hemoglobin A1c is now over an 8.  Objective: Pulses remain palpable reactive hyperkeratotic lesion subfifth right medial border of the hallux bilateral and subfifth left.  No ulcerations are noted.  Debrided completely today to normal skin.  Toenails are thick yellow dystrophic clinical mycotic.  No change in neuropathy.  Assessment: Pain limb secondary to type 2 diabetes with diabetic peripheral neuropathy history of ulcerations which were debrided today and painful toenails bilateral.  Plan: Debrided all reactive hyperkeratotic tissue debrided toenails 1 through 5 bilaterally today follow-up with her as needed.

## 2020-12-13 ENCOUNTER — Other Ambulatory Visit: Payer: Self-pay | Admitting: Family Medicine

## 2020-12-13 DIAGNOSIS — E119 Type 2 diabetes mellitus without complications: Secondary | ICD-10-CM

## 2020-12-29 ENCOUNTER — Telehealth: Payer: Self-pay

## 2020-12-29 NOTE — Telephone Encounter (Signed)
-----   Message from Gae Dry, MD sent at 12/14/2020  2:00 PM EDT ----- Regarding: MMG Received notice she has not received MMG yet as ordered at her Annual. Please check and encourage her to do this, and document conversation.

## 2020-12-29 NOTE — Telephone Encounter (Signed)
Pt states she will schedule her mammogram

## 2021-01-04 ENCOUNTER — Other Ambulatory Visit: Payer: Self-pay

## 2021-01-04 ENCOUNTER — Ambulatory Visit: Payer: Medicare PPO | Admitting: Podiatry

## 2021-01-04 ENCOUNTER — Encounter: Payer: Self-pay | Admitting: Podiatry

## 2021-01-04 DIAGNOSIS — E1142 Type 2 diabetes mellitus with diabetic polyneuropathy: Secondary | ICD-10-CM

## 2021-01-04 DIAGNOSIS — D2372 Other benign neoplasm of skin of left lower limb, including hip: Secondary | ICD-10-CM | POA: Diagnosis not present

## 2021-01-04 DIAGNOSIS — B351 Tinea unguium: Secondary | ICD-10-CM | POA: Diagnosis not present

## 2021-01-04 DIAGNOSIS — M79676 Pain in unspecified toe(s): Secondary | ICD-10-CM | POA: Diagnosis not present

## 2021-01-04 DIAGNOSIS — D2371 Other benign neoplasm of skin of right lower limb, including hip: Secondary | ICD-10-CM

## 2021-01-04 NOTE — Progress Notes (Signed)
She presents today chief complaint of painfully elongated toenails and calluses.  Objective: Vital signs are stable she alert and oriented x3.  Pulses are palpable.  Toenails are long thick yellow dystrophic onychomycotic there is also superficial skin breakdown to the medial border of the hallux right.  No purulence no malodor.  I debrided the reactive hyperkeratosis does not probe deep.  Assessment: Right now it is a preulcerative lesion hallux right pain limb secondary to onychomycosis and diabetes mellitus with neuropathy.  Plan: Debrided reactive hyperkeratotic tissue and debrided toenails 1 through 5 bilateral.  Follow-up with her in a few weeks.

## 2021-01-17 ENCOUNTER — Other Ambulatory Visit: Payer: Self-pay | Admitting: Family Medicine

## 2021-01-17 DIAGNOSIS — E7800 Pure hypercholesterolemia, unspecified: Secondary | ICD-10-CM

## 2021-01-17 DIAGNOSIS — J302 Other seasonal allergic rhinitis: Secondary | ICD-10-CM

## 2021-01-17 NOTE — Telephone Encounter (Signed)
Requested Prescriptions  Pending Prescriptions Disp Refills  . rosuvastatin (CRESTOR) 20 MG tablet [Pharmacy Med Name: Rosuvastatin Calcium 20 MG Oral Tablet] 90 tablet 0    Sig: Take 1 tablet by mouth once daily     Cardiovascular:  Antilipid - Statins Failed - 01/17/2021  4:58 PM      Failed - HDL in normal range and within 360 days    HDL  Date Value Ref Range Status  06/08/2020 32 (L) >39 mg/dL Final         Passed - Total Cholesterol in normal range and within 360 days    Cholesterol, Total  Date Value Ref Range Status  06/08/2020 110 100 - 199 mg/dL Final         Passed - LDL in normal range and within 360 days    LDL Chol Calc (NIH)  Date Value Ref Range Status  06/08/2020 57 0 - 99 mg/dL Final         Passed - Triglycerides in normal range and within 360 days    Triglycerides  Date Value Ref Range Status  06/08/2020 112 0 - 149 mg/dL Final         Passed - Patient is not pregnant      Passed - Valid encounter within last 12 months    Recent Outpatient Visits          3 months ago Type 2 diabetes mellitus with diabetic polyneuropathy, without long-term current use of insulin ([place])   [facility] Jerrol Banana., MD   6 months ago Pre-op examination   [facility] Jerrol Banana., MD   6 months ago Cough   [facility] Jerrol Banana., MD   7 months ago Type 2 diabetes mellitus with diabetic polyneuropathy, without long-term current use of insulin [facility])   [facility] Jerrol Banana., MD   9 months ago Welcome to [employer] preventive visit   [facility] Rosanna Randy, Retia Passe., MD      Future Appointments            In 4 weeks Jerrol Banana., MD [facility]           . montelukast (SINGULAIR) 10 MG tablet [Pharmacy Med Name: Montelukast Sodium 10 MG Oral Tablet] 90 tablet 0    Sig: TAKE 1 TABLET BY MOUTH AT BEDTIME      Pulmonology:  Leukotriene Inhibitors Passed - 01/17/2021  4:58 PM      Passed - Valid encounter within last 12 months    Recent Outpatient Visits          3 months ago Type 2 diabetes mellitus with diabetic polyneuropathy, without long-term current use of insulin [facility])   [facility] Jer[facility] Ban[facility] MD   6 months ago Pre-op examination   [facility] Jerrol Banana., MD   6 months ago Cough   [facility] Jerrol Banana., MD   7 months ago Type 2 diabetes mellitus with diabetic polyneuropathy, without long-term current use of insulin [facility])   [facility] Jerrol Banana., MD   9 months ago Welcome to [employer] preventive visit   [facility] Jerrol Banana., MD      Future Appointments            In 4 weeks Jerrol Banana., MD [facility]

## 2021-01-18 ENCOUNTER — Other Ambulatory Visit: Payer: Self-pay

## 2021-01-18 ENCOUNTER — Ambulatory Visit
Admission: RE | Admit: 2021-01-18 | Discharge: 2021-01-18 | Disposition: A | Discharge status: Home / Self Care | Payer: Medicare PPO | Source: Ambulatory Visit | Attending: Obstetrics & Gynecology | Admitting: Obstetrics & Gynecology

## 2021-01-18 ENCOUNTER — Ambulatory Visit: Payer: Medicare PPO

## 2021-01-18 DIAGNOSIS — Z1231 Encounter for screening mammogram for malignant neoplasm of breast: Secondary | ICD-10-CM | POA: Insufficient documentation

## 2021-02-07 ENCOUNTER — Other Ambulatory Visit: Payer: Self-pay | Admitting: Family Medicine

## 2021-02-07 DIAGNOSIS — K219 Gastro-esophageal reflux disease without esophagitis: Secondary | ICD-10-CM

## 2021-02-07 DIAGNOSIS — J302 Other seasonal allergic rhinitis: Secondary | ICD-10-CM

## 2021-02-07 DIAGNOSIS — I10 Essential (primary) hypertension: Secondary | ICD-10-CM

## 2021-02-15 ENCOUNTER — Other Ambulatory Visit: Payer: Self-pay

## 2021-02-15 ENCOUNTER — Ambulatory Visit: Payer: Medicare PPO | Admitting: Family Medicine

## 2021-02-15 ENCOUNTER — Encounter: Payer: Self-pay | Admitting: Family Medicine

## 2021-02-15 VITALS — BP 132/89 | HR 73 | Temp 97.7°F | Resp 16 | Wt 261.0 lb

## 2021-02-15 DIAGNOSIS — K219 Gastro-esophageal reflux disease without esophagitis: Secondary | ICD-10-CM | POA: Diagnosis not present

## 2021-02-15 DIAGNOSIS — E668 Other obesity: Secondary | ICD-10-CM

## 2021-02-15 DIAGNOSIS — E11621 Type 2 diabetes mellitus with foot ulcer: Secondary | ICD-10-CM

## 2021-02-15 DIAGNOSIS — G4733 Obstructive sleep apnea (adult) (pediatric): Secondary | ICD-10-CM | POA: Diagnosis not present

## 2021-02-15 DIAGNOSIS — L97511 Non-pressure chronic ulcer of other part of right foot limited to breakdown of skin: Secondary | ICD-10-CM | POA: Diagnosis not present

## 2021-02-15 DIAGNOSIS — I10 Essential (primary) hypertension: Secondary | ICD-10-CM | POA: Diagnosis not present

## 2021-02-15 DIAGNOSIS — E1142 Type 2 diabetes mellitus with diabetic polyneuropathy: Secondary | ICD-10-CM

## 2021-02-15 LAB — POCT GLYCOSYLATED HEMOGLOBIN (HGB A1C)
Est. average glucose Bld gHb Est-mCnc: 163
Hemoglobin A1C: 7.3 % — AB (ref 4.0–5.6)

## 2021-02-15 NOTE — Progress Notes (Signed)
I,Roshena L Chambers,acting as a scribe for [name], MD.,have documented all relevant documentation on the behalf of [name], MD,as directed by  [name], MD while in the presence of [name], MD.    Established patient visit   Patient: Karen Stephenson   DOB: 10-23-1955   65 y.o. Female  MRN: [id number] Visit Date: 02/15/2021  Today's healthcare provider: [name], MD   Chief Complaint  Patient presents with   Diabetes   Hypertension   Hyperlipidemia   Subjective  -------------------------------------------------------------------------------------------------------------------- HPI  Patient is feeling well and has no complaints.  Weight is down 3 pounds from her last visit. Blood sugars have been well under 200. Diabetes Mellitus Type II, follow-up  Lab Results  Component Value Date   HGBA1C 7.3 (A) 02/15/2021   HGBA1C 8.2 (A) 10/12/2020   HGBA1C 8.4 (H) 06/08/2020   Last seen for diabetes 4 months ago.  Management since then includes; Restarted Jardiance at 10 mg daily to try to get it below 7. She reports good compliance with treatment. She is not having side effects.   Home blood sugar records:  random glucose checks usually under 200  Episodes of hypoglycemia? No    Current insulin regiment: none Most Recent Eye Exam: not UTD  --------------------------------------------------------------------------------------------------- Hypertension, follow-up  BP Readings from Last 3 Encounters:  02/15/21 132/89  10/12/20 125/84  09/21/20 120/80   Wt Readings from Last 3 Encounters:  02/15/21 261 lb (118.4 kg)  10/12/20 264 lb (119.7 kg)  09/21/20 265 lb (120.2 kg)     She was last seen for hypertension 4 months ago.  BP at that visit was 125/84. Management since that visit includes; Blood pressure well controlled on losartan,-Jardiance will also help. She reports good compliance with treatment. She is not  having side effects.  She is not exercising. She is adherent to low salt diet.   Outside blood pressures are not checked.  She does not smoke.  Use of agents associated with hypertension: NSAIDS.   --------------------------------------------------------------------------------------------------- Lipid/Cholesterol, follow-up  Last Lipid Panel: Lab Results  Component Value Date   CHOL 110 06/08/2020   LDLCALC 57 06/08/2020   HDL 32 (L) 06/08/2020   TRIG 112 06/08/2020    She was last seen for this 8 months ago.  Management since that visit includes; On rosuvastatin.  She reports good compliance with treatment. She is not having side effects.   She is following a Regular diet. Current exercise: none  Last metabolic panel Lab Results  Component Value Date   GLUCOSE 163 (H) 08/02/2020   NA 142 08/02/2020   K 3.3 (L) 08/02/2020   BUN 18 08/02/2020   CREATININE 1.00 08/02/2020   GFRNONAA 79 06/08/2020   GFRAA 92 06/08/2020   CALCIUM 9.5 06/08/2020   AST 24 06/08/2020   ALT 24 06/08/2020   The ASCVD Risk score Mikey Bussing DC Jr., et al., 2013) failed to calculate for the following reasons:   The valid total cholesterol range is 130 to 320 mg/dL  ---------------------------------------------------------------------------------------------------       Medications: Outpatient Medications Prior to Visit  Medication Sig   acarbose (PRECOSE) 25 MG tablet TAKE 1 TABLET BY MOUTH THREE TIMES DAILY WITH MEALS   albuterol (VENTOLIN HFA) 108 (90 Base) MCG/ACT inhaler Inhale 2 puffs into the lungs every 6 (six) hours as needed for wheezing or shortness of breath.   aspirin EC 81 MG tablet Take 81 mg by mouth  daily. Swallow whole.   BIOTIN PO Take 1 tablet by mouth daily.   cadexomer iodine (IODOSORB) 0.9 % gel Apply 1 application topically daily.   cetirizine (ZYRTEC) 10 MG tablet Take 10 mg by mouth daily.   chlorpheniramine-HYDROcodone (TUSSIONEX PENNKINETIC ER) 10-8 MG/5ML SUER  Take 5 mLs by mouth every 12 (twelve) hours as needed for cough.   empagliflozin (JARDIANCE) 10 MG TABS tablet Take 1 tablet (10 mg total) by mouth daily.   fluticasone (FLONASE) 50 MCG/ACT nasal spray Use 2 spray(s) in each nostril once daily   Fluticasone-Salmeterol (ADVAIR) 250-50 MCG/DOSE AEPB INHALE ONE DOSE BY MOUTH AS NEEDED   glucose blood test strip Check sugar once daily, E 11.9   Homeopathic Products (LEG CRAMP RELIEF PO) Take 2 capsules by mouth daily.    hydrochlorothiazide (HYDRODIURIL) 25 MG tablet Take 1 tablet by mouth once daily   hyoscyamine (LEVBID) 0.375 MG 12 hr tablet Take 1 tablet (0.375 mg total) by mouth as needed.   JANUVIA 100 MG tablet Take 1 tablet by mouth once daily   Lancets (ONETOUCH ULTRASOFT) lancets Check sugar once daily ,DX E11.9   losartan (COZAAR) 100 MG tablet Take 1 tablet by mouth once daily   montelukast (SINGULAIR) 10 MG tablet TAKE 1 TABLET BY MOUTH AT BEDTIME   pantoprazole (PROTONIX) 40 MG tablet Take 1 tablet by mouth once daily   rosuvastatin (CRESTOR) 20 MG tablet Take 1 tablet by mouth once daily   vitamin B-12 (CYANOCOBALAMIN) 1000 MCG tablet Take 1,000 mcg by mouth daily.   Vitamin D, Cholecalciferol, 50 MCG (2000 UT) CAPS Take 2,000 Units by mouth daily.   zinc gluconate 50 MG tablet Take 50 mg by mouth daily.   No facility-administered medications prior to visit.    Review of Systems  Constitutional:  Negative for appetite change, chills, fatigue and fever.  Respiratory:  Negative for chest tightness and shortness of breath.   Cardiovascular:  Negative for chest pain and palpitations.  Gastrointestinal:  Negative for abdominal pain, nausea and vomiting.  Neurological:  Negative for dizziness and weakness.       Objective  -------------------------------------------------------------------------------------------------------------------- BP 132/89 (BP Location: Right Arm, Patient Position: Sitting, Cuff Size: Large)   Pulse 73    Temp 97.7 F (36.5 C) (Temporal)   Resp 16   Wt 261 lb (118.4 kg)   BMI 39.68 kg/m  BP Readings from Last 3 Encounters:  02/15/21 132/89  10/12/20 125/84  09/21/20 120/80   Wt Readings from Last 3 Encounters:  02/15/21 261 lb (118.4 kg)  10/12/20 264 lb (119.7 kg)  09/21/20 265 lb (120.2 kg)       Physical Exam Vitals and nursing note reviewed.  Constitutional:      Appearance: She is well-developed. She is obese.  HENT:     Head: Normocephalic and atraumatic.     Right Ear: External ear normal.     Left Ear: External ear normal.  Eyes:     General: No scleral icterus.    Conjunctiva/sclera: Conjunctivae normal.  Neck:     Thyroid: No thyromegaly.     Vascular: No carotid bruit.  Cardiovascular:     Rate and Rhythm: Normal rate and regular rhythm.     Pulses: Normal pulses.     Heart sounds: Normal heart sounds.  Pulmonary:     Effort: Pulmonary effort is normal.     Breath sounds: Normal breath sounds.  Chest:  Breasts:    Right: No swelling, bleeding, inverted nipple,  mass, nipple discharge, skin change or tenderness.     Left: No swelling, bleeding, inverted nipple, mass, nipple discharge, skin change or tenderness.  Abdominal:     Palpations: Abdomen is soft.  Musculoskeletal:     Right lower leg: No edema.     Left lower leg: No edema.  Lymphadenopathy:     Cervical: No cervical adenopathy.  Skin:    General: Skin is warm and dry.  Neurological:     General: No focal deficit present.     Mental Status: She is alert and oriented to person, place, and time.  Psychiatric:        Mood and Affect: Mood normal.        Behavior: Behavior normal.        Thought Content: Thought content normal.        Judgment: Judgment normal.      Results for orders placed or performed in visit on 02/15/21  POCT glycosylated hemoglobin (Hb A1C)  Result Value Ref Range   Hemoglobin A1C 7.3 (A) 4.0 - 5.6 %   Est. average glucose Bld gHb Est-mCnc 163     Assessment &  Plan  ---------------------------------------------------------------------------------------------------------------------- 1. Type 2 diabetes mellitus with diabetic polyneuropathy, without long-term current use of insulin (HCC) Improved. Continue medication regiment. Follow up in 5 months for CPE.  Continue Precose Januvia Jardiance - POCT glycosylated hemoglobin (Hb A1C)  2. Essential (primary) hypertension Good control with HCTZ losartan   3. Obstructive apnea   4. Gastroesophageal reflux disease without esophagitis Pantoprazole regularly  5. Diabetic ulcer of right foot associated with type 2 diabetes mellitus, limited to breakdown of skin, unspecified part of foot ([place]) Followed by podiatry, no signs of infection. Diabetic foot exam is normal today  6. Extreme obesity   Return in about 5 months (around 07/18/2021).         Britiany Silbernagel Cranford Mon, MD  [facility] [phone] (phone) [phone] (fax)  [place]

## 2021-02-22 ENCOUNTER — Other Ambulatory Visit: Payer: Self-pay

## 2021-02-22 ENCOUNTER — Encounter: Payer: Self-pay | Admitting: Podiatry

## 2021-02-22 ENCOUNTER — Ambulatory Visit (INDEPENDENT_AMBULATORY_CARE_PROVIDER_SITE_OTHER): Payer: Medicare PPO | Admitting: Podiatry

## 2021-02-22 DIAGNOSIS — E1142 Type 2 diabetes mellitus with diabetic polyneuropathy: Secondary | ICD-10-CM

## 2021-02-22 DIAGNOSIS — B351 Tinea unguium: Secondary | ICD-10-CM

## 2021-02-22 DIAGNOSIS — M79676 Pain in unspecified toe(s): Secondary | ICD-10-CM

## 2021-02-22 DIAGNOSIS — D2372 Other benign neoplasm of skin of left lower limb, including hip: Secondary | ICD-10-CM | POA: Diagnosis not present

## 2021-02-22 DIAGNOSIS — D2371 Other benign neoplasm of skin of right lower limb, including hip: Secondary | ICD-10-CM

## 2021-02-22 NOTE — Progress Notes (Signed)
She presents today for diabetic foot check states that she like to have her toenails done trimmed as well.  Objective: Vital signs are stable alert oriented x3 pulses remain palpable.  Reactive hyper keratomas with superficial skin breakdown and preulcerative lesions again to the medial aspect of the hallux interphalangeal joints.  She also has 1 lesion subfifth met head right.  None of these once debrided today demonstrate any type of bleeding or exudate.  Toenails are long thick yellow dystrophic clinically mycotic.  She continues to demonstrate signs and symptoms of neuropathy.  Assessment: Diabetic peripheral neuropathy diabetic ulcers/preulcers hallux bilateral.  Pain limb secondary to onychomycosis.  Plan: Debridement of all reactive hyperkeratotic tissue today debridement of toenails 1 through 5 bilateral today.

## 2021-03-15 ENCOUNTER — Other Ambulatory Visit: Payer: Self-pay | Admitting: Family Medicine

## 2021-03-15 DIAGNOSIS — E119 Type 2 diabetes mellitus without complications: Secondary | ICD-10-CM

## 2021-03-20 ENCOUNTER — Other Ambulatory Visit: Payer: Self-pay | Admitting: Family Medicine

## 2021-03-20 DIAGNOSIS — E119 Type 2 diabetes mellitus without complications: Secondary | ICD-10-CM

## 2021-03-20 DIAGNOSIS — J302 Other seasonal allergic rhinitis: Secondary | ICD-10-CM

## 2021-04-05 ENCOUNTER — Other Ambulatory Visit: Payer: Self-pay

## 2021-04-05 ENCOUNTER — Ambulatory Visit: Payer: Medicare PPO | Admitting: Podiatry

## 2021-04-05 ENCOUNTER — Encounter: Payer: Self-pay | Admitting: Podiatry

## 2021-04-05 DIAGNOSIS — E1142 Type 2 diabetes mellitus with diabetic polyneuropathy: Secondary | ICD-10-CM

## 2021-04-05 DIAGNOSIS — M79676 Pain in unspecified toe(s): Secondary | ICD-10-CM

## 2021-04-05 DIAGNOSIS — B351 Tinea unguium: Secondary | ICD-10-CM | POA: Diagnosis not present

## 2021-04-05 DIAGNOSIS — D2371 Other benign neoplasm of skin of right lower limb, including hip: Secondary | ICD-10-CM

## 2021-04-05 DIAGNOSIS — D2372 Other benign neoplasm of skin of left lower limb, including hip: Secondary | ICD-10-CM | POA: Diagnosis not present

## 2021-04-05 NOTE — Progress Notes (Signed)
She presents today for follow-up of her chronic ulcer of her hallux bilaterally.  She would also like to have her nails trimmed.  She denies that they have opened or had any problems with them.  Denies fever chills nausea vomit muscle aches pain states her blood sugar is doing well.  Objective: Vital signs stable alert oriented x3 pulses remain palpable reactive hyper keratomas medial aspect of the hallux interphalangeal joint once debrided does not demonstrate some bleeding to the hallux left but none on the right.  All other reactive hyperkeratotic tissue was debrided.  Also she has painfully elongated toenails 1 through 5 bilaterally.  Assessment: Diabetic peripheral neuropathy with diabetic ulcerations and pain in limb secondary to onychomycosis.  Plan: Debridement of the wounds today placed a dry sterile compressive dressing.  Also trimmed her nails 1 through 5 bilateral.

## 2021-05-04 ENCOUNTER — Other Ambulatory Visit: Payer: Self-pay | Admitting: Family Medicine

## 2021-05-04 DIAGNOSIS — I10 Essential (primary) hypertension: Secondary | ICD-10-CM

## 2021-05-04 DIAGNOSIS — K219 Gastro-esophageal reflux disease without esophagitis: Secondary | ICD-10-CM

## 2021-05-05 NOTE — Telephone Encounter (Signed)
Requested medications are due for refill today yes  Requested medications are on the active medication list yes  Last refill 02/08/21  Last visit 02/15/21  Future visit scheduled 07/26/20  Notes to clinic failed protocol due to lab greater than 180 days ago, please assess.  Requested Prescriptions  Pending Prescriptions Disp Refills   losartan (COZAAR) 100 MG tablet [Pharmacy Med Name: Losartan Potassium 100 MG Oral Tablet] 90 tablet 0    Sig: Take 1 tablet by mouth once daily     Cardiovascular:  Angiotensin Receptor Blockers Failed - 05/04/2021 11:32 PM      Failed - Cr in normal range and within 180 days    Creatinine, Ser  Date Value Ref Range Status  08/02/2020 1.00 0.44 - 1.00 mg/dL Final          Failed - K in normal range and within 180 days    Potassium  Date Value Ref Range Status  08/02/2020 3.3 (L) 3.5 - 5.1 mmol/L Final          Passed - Patient is not pregnant      Passed - Last BP in normal range    BP Readings from Last 1 Encounters:  02/15/21 132/89          Passed - Valid encounter within last 6 months    Recent Outpatient Visits           2 months ago Type 2 diabetes mellitus with diabetic polyneuropathy, without long-term current use of insulin ([place])   [facility] Jerrol Banana., MD   6 months ago Type 2 diabetes mellitus with diabetic polyneuropathy, without long-term current use of insulin ([place])   [facility] Jerrol Banana., MD   9 months ago Pre-op examination   [facility] Jerrol Banana., MD   9 months ago Cough   [facility] Jerrol Banana., MD   11 months ago Type 2 diabetes mellitus with diabetic polyneuropathy, without long-term current use of insulin [facility])   [facility] Jerrol Banana., MD              Signed Prescriptions Disp Refills   hydrochlorothiazide (HYDRODIURIL) 25 MG tablet 90 tablet 0    Sig: Take 1 tablet  by mouth once daily     Cardiovascular: Diuretics - Thiazide Failed - 05/04/2021 11:32 PM      Failed - K in normal range and within 360 days    Potassium  Date Value Ref Range Status  08/02/2020 3.3 (L) 3.5 - 5.1 mmol/L Final          Passed - Ca in normal range and within 360 days    Calcium  Date Value Ref Range Status  06/08/2020 9.5 8.7 - 10.3 mg/dL Final   Calcium, Ion  Date Value Ref Range Status  08/02/2020 1.19 1.15 - 1.40 mmol/L Final          Passed - Cr in normal range and within 360 days    Creatinine, Ser  Date Value Ref Range Status  08/02/2020 1.00 0.44 - 1.00 mg/dL Final          Passed - Na in normal range and within 360 days    Sodium  Date Value Ref Range Status  08/02/2020 142 135 - 145 mmol/L Final  06/08/2020 138 134 - 144 mmol/L Final          Passed - Last BP in normal  range    BP Readings from Last 1 Encounters:  02/15/21 132/89          Passed - Valid encounter within last 6 months    Recent Outpatient Visits           2 months ago Type 2 diabetes mellitus with diabetic polyneuropathy, without long-term current use of insulin [facility])   [facility] Jerrol [facility]   6 months ago Type 2 diabetes mellitus with diabetic polyneuropathy, without long-term current use of insulin [facility])   [facility] Jerrol [facility] MD   9 months ago Pre-op examination   [facility] Jerrol Banana., MD   9 months ago Cough   [facility] Jerrol Banana., MD   11 months ago Type 2 diabetes mellitus with diabetic polyneuropathy, without long-term current use of insulin [facility])   [facility] Jerrol Banana., MD               pantoprazole (PROTONIX) 40 MG tablet 90 tablet 2    Sig: Take 1 tablet by mouth once daily     Gastroenterology: Proton Pump Inhibitors Passed - 05/04/2021 11:32 PM      Passed - Valid encounter within last 12 months     Recent Outpatient Visits           2 months ago Type 2 diabetes mellitus with diabetic polyneuropathy, without long-term current use of insulin [facility])   [facility] Jerrol Banana., MD   6 months ago Type 2 diabetes mellitus with diabetic polyneuropathy, without long-term current use of insulin [facility])   [facility] Jerrol Banana., MD   9 months ago Pre-op examination   [facility] Jerrol [facility] MD   9 months ago Cough   [facility] Jerrol Banana., MD   11 months ago Type 2 diabetes mellitus with diabetic polyneuropathy, without long-term current use of insulin [facility])   [facility] Jerrol Banana., MD

## 2021-05-08 ENCOUNTER — Encounter: Payer: Self-pay | Admitting: Podiatry

## 2021-05-09 ENCOUNTER — Other Ambulatory Visit: Payer: Self-pay | Admitting: Family Medicine

## 2021-05-09 DIAGNOSIS — J302 Other seasonal allergic rhinitis: Secondary | ICD-10-CM

## 2021-05-09 DIAGNOSIS — E7800 Pure hypercholesterolemia, unspecified: Secondary | ICD-10-CM

## 2021-05-17 ENCOUNTER — Ambulatory Visit: Payer: Medicare PPO | Admitting: Podiatry

## 2021-06-07 ENCOUNTER — Ambulatory Visit: Payer: Medicare PPO | Admitting: Podiatry

## 2021-06-12 ENCOUNTER — Other Ambulatory Visit: Payer: Self-pay | Admitting: Family Medicine

## 2021-06-12 DIAGNOSIS — E119 Type 2 diabetes mellitus without complications: Secondary | ICD-10-CM

## 2021-06-14 ENCOUNTER — Encounter: Payer: Self-pay | Admitting: Podiatry

## 2021-06-14 ENCOUNTER — Ambulatory Visit: Payer: Medicare PPO | Admitting: Podiatry

## 2021-06-14 ENCOUNTER — Other Ambulatory Visit: Payer: Self-pay

## 2021-06-14 DIAGNOSIS — L97521 Non-pressure chronic ulcer of other part of left foot limited to breakdown of skin: Secondary | ICD-10-CM

## 2021-06-14 DIAGNOSIS — M79676 Pain in unspecified toe(s): Secondary | ICD-10-CM

## 2021-06-14 DIAGNOSIS — L97511 Non-pressure chronic ulcer of other part of right foot limited to breakdown of skin: Secondary | ICD-10-CM

## 2021-06-14 DIAGNOSIS — B351 Tinea unguium: Secondary | ICD-10-CM

## 2021-06-14 DIAGNOSIS — E1142 Type 2 diabetes mellitus with diabetic polyneuropathy: Secondary | ICD-10-CM

## 2021-06-14 DIAGNOSIS — R0989 Other specified symptoms and signs involving the circulatory and respiratory systems: Secondary | ICD-10-CM

## 2021-06-14 NOTE — Progress Notes (Signed)
She presents today for follow-up of her chronic ulceration and painful elongated toenails.  Relates that she has some pain in her right calf with ambulation it is relieved with rest.  Objective: Pulses which have been palpable bilaterally and symmetrical are diminished on the right side at this point.  Capillary fill time is still immediate.  Ulcerations bilateral forefoot have gone on to heal at this point and demonstrate only reactive hyperkeratosis but no open holes or wounds.  Toenails are long thick yellow dystrophic clinically mycotic.  Assessment: Diabetes with diabetic peripheral neuropathy, currently concerned about worsening peripheral vascular disease right side.  Ulcerations hallux bilateral.  Onychomycosis.  Plan: Debridement of nails 1 through 5 bilaterally.  Debridement of all ulcerations bilateral.  Refer her to vascular department for reevaluation of her right lower extremity pulses.  ABIs are requested

## 2021-07-13 ENCOUNTER — Telehealth: Payer: Self-pay | Admitting: Podiatry

## 2021-07-13 NOTE — Telephone Encounter (Signed)
Called pt gave her information per [place].

## 2021-07-13 NOTE — Telephone Encounter (Signed)
Phone: [phone]  [place]  Do you want to give her a call and let her know?

## 2021-07-13 NOTE — Telephone Encounter (Signed)
Patient called asking what vascular MD did you refer her too? Patient has not heard from them yet and she was going to call them herself. Patients number is [phone].

## 2021-07-21 ENCOUNTER — Telehealth: Payer: Medicare PPO | Admitting: Emergency Medicine

## 2021-07-21 ENCOUNTER — Ambulatory Visit: Payer: Self-pay | Admitting: *Deleted

## 2021-07-21 DIAGNOSIS — K1120 Sialoadenitis, unspecified: Secondary | ICD-10-CM

## 2021-07-21 MED ORDER — CLINDAMYCIN HCL 150 MG PO CAPS: 300.0000 mg | ORAL_CAPSULE | Freq: Three times a day (TID) | ORAL | 0 refills | Status: DC

## 2021-07-21 NOTE — Telephone Encounter (Signed)
°  Chief Complaint: Right ear pain and swelling under right jawbone area Symptoms: Sore and swollen.   Starting to hurt with eating. Frequency: A week now Pertinent Negatives: Patient denies ear drainage Disposition: [] ED /[] Urgent Care (no appt availability in office) / [] Appointment(In office/virtual)/ [x]  [facility] [facility]/ [] Home Care/ [] Refused Recommended Disposition /[] [place] [facility]/ []  Follow-up with PCP Additional Notes: I assisted pt in scheduling a MyChart virtual visit for today at 6:30 PM since there were no appts available at the practice.

## 2021-07-21 NOTE — Telephone Encounter (Signed)
I returned pt's call.  She had called in earlier today c/o right ear and gland swelling and pain.   Requesting an appt but there are none until next week.   Reason for Disposition  Earache  (Exceptions: brief ear pain of < 60 minutes duration, earache occurring during air travel  Answer Assessment - Initial Assessment Questions 1. LOCATION: "Which ear is involved?"     Right ear below it is swollen and sore.  It's my gland underneath my jawbone.   It hurts when I eat.   Pt has opted to do a MyChart visit. 2. ONSET: "When did the ear start hurting"      It started a week ago.   I have a little bit of coughing.   My nose on right side is stopped up.   None of the OTC medications are helping. 3. SEVERITY: "How bad is the pain?"  (Scale 1-10; mild, moderate or severe)   - MILD (1-3): doesn't interfere with normal activities    - MODERATE (4-7): interferes with normal activities or awakens from sleep    - SEVERE (8-10): excruciating pain, unable to do any normal activities      *No Answer* 4. URI SYMPTOMS: "Do you have a runny nose or cough?"     *No Answer* 5. FEVER: "Do you have a fever?" If Yes, ask: "What is your temperature, how was it measured, and when did it start?"     *No Answer* 6. CAUSE: "Have you been swimming recently?", "How often do you use Q-TIPS?", "Have you had any recent air travel or scuba diving?"     *No Answer* 7. OTHER SYMPTOMS: "Do you have any other symptoms?" (e.g., headache, stiff neck, dizziness, vomiting, runny nose, decreased hearing)     *No Answer* 8. PREGNANCY: "Is there any chance you are pregnant?" "When was your last menstrual period?"     *No Answer*  Protocols used: Bethann Punches

## 2021-07-21 NOTE — Progress Notes (Signed)
Virtual Visit Consent   Karen Stephenson, you are scheduled for a virtual visit with a [facility] provider today.     Just as with appointments in the office, your consent must be obtained to participate.  Your consent will be active for this visit and any virtual visit you may have with one of our providers in the next 365 days.     If you have a MyChart account, a copy of this consent can be sent to you electronically.  All virtual visits are billed to your insurance company just like a traditional visit in the office.    As this is a virtual visit, video technology does not allow for your provider to perform a traditional examination.  This may limit your provider's ability to fully assess your condition.  If your provider identifies any concerns that need to be evaluated in person or the need to arrange testing (such as labs, EKG, etc.), we will make arrangements to do so.     Although advances in technology are sophisticated, we cannot ensure that it will always work on either your end or our end.  If the connection with a video visit is poor, the visit may have to be switched to a telephone visit.  With either a video or telephone visit, we are not always able to ensure that we have a secure connection.     I need to obtain your verbal consent now.   Are you willing to proceed with your visit today?    Almendra B Cephas has provided verbal consent on 07/21/2021 for a virtual visit video.   Montine Circle, PA-C   Date: 07/21/2021 6:29 PM   Virtual Visit via Video Note   I, Montine Circle, connected with  Karen Stephenson  ([id number], 01/05/1956) on 07/21/21 at  6:30 PM EST by a video-enabled telemedicine application and verified that I am speaking with the correct person using two identifiers.  Location: Patient: Virtual Visit Location Patient: Home Provider: Virtual Visit Location Provider: Home Office   I discussed the limitations of evaluation and management by telemedicine and the  availability of in person appointments. The patient expressed understanding and agreed to proceed.    History of Present Illness: Karen Stephenson is a 66 y.o. who identifies as a female who was assigned female at birth, and is being seen today for chief complaint of swollen knot or lymph node beneath her jaw on the right side.  She states that it has been slowly worsening this week.  She states that she had some foul taste from her salivary gland earlier in the week.  She reports mild sore throat and rhinorrhea. States that she has had this before and it resolved with antibiotics.  HPI: HPI  Problems:  Patient Active Problem List   Diagnosis Date Noted   S/P laparoscopic hysterectomy 08/18/2020   S/P bilateral salpingo-oophorectomy 08/18/2020   Prolapse of female pelvic organs 08/02/2020   Post-menopausal bleeding 08/02/2020   Diabetic foot ulcer ([place]) 01/12/2019   Chronic venous insufficiency 12/01/2018   Lower extremity ulceration, unspecified laterality, with fat layer exposed ([place]) 12/01/2018   Diabetes mellitus without complication ([place]) 65/68/1275   Airway hyperreactivity 12/13/2014   Acid reflux 12/13/2014   Hypercholesteremia 12/13/2014   Decreased potassium in the blood 12/13/2014   Cramps of lower extremity 12/13/2014   Extreme obesity 12/13/2014   Avitaminosis D 12/13/2014   Dermatophytic onychia 12/24/2013   Bony exostosis 12/24/2013   Unstable angina pectoris ([place])  12/24/2013   Polyneuropathy 12/24/2013   Family history of breast cancer 03/01/2008   Obstructive apnea 12/26/2007   Diaphragmatic hernia 10/08/2006   Allergic rhinitis, seasonal 10/08/2006   Diabetes mellitus, type 2 ([place]) 10/08/2006   Essential (primary) hypertension 12/29/1999    Allergies:  Allergies  Allergen Reactions   Amoxicillin-Pot Clavulanate Diarrhea    no further information given.   Aspirin     Higher doses - passed out    Ducodyl [Bisacodyl]     Passed out, extreme cramping     Erythromycin     Other reaction(s): Stomach Ache Stomach pain.   Glipizide     Extreme stomach aches    Metformin Hcl     Extreme stomach cramps    Latex Rash   Macrobid [Nitrofurantoin]     Severe diarrhea, severe abdominal pain/cramping   Medications:  Current Outpatient Medications:    acarbose (PRECOSE) 25 MG tablet, TAKE 1 TABLET BY MOUTH THREE TIMES DAILY WITH MEALS, Disp: 270 tablet, Rfl: 3   albuterol (VENTOLIN HFA) 108 (90 Base) MCG/ACT inhaler, Inhale 2 puffs into the lungs every 6 (six) hours as needed for wheezing or shortness of breath., Disp: 8 g, Rfl: 0   aspirin EC 81 MG tablet, Take 81 mg by mouth daily. Swallow whole., Disp: , Rfl:    BIOTIN PO, Take 1 tablet by mouth daily., Disp: , Rfl:    cadexomer iodine (IODOSORB) 0.9 % gel, Apply 1 application topically daily., Disp: 40 g, Rfl: 3   cetirizine (ZYRTEC) 10 MG tablet, Take 10 mg by mouth daily., Disp: , Rfl:    chlorpheniramine-HYDROcodone (TUSSIONEX PENNKINETIC ER) 10-8 MG/5ML SUER, Take 5 mLs by mouth every 12 (twelve) hours as needed for cough., Disp: 140 mL, Rfl: 0   empagliflozin (JARDIANCE) 10 MG TABS tablet, Take 1 tablet (10 mg total) by mouth daily., Disp: 90 tablet, Rfl: 3   fluticasone (FLONASE) 50 MCG/ACT nasal spray, Use 2 spray(s) in each nostril once daily, Disp: 16 g, Rfl: 5   Fluticasone-Salmeterol (ADVAIR) 250-50 MCG/DOSE AEPB, INHALE ONE DOSE BY MOUTH AS NEEDED, Disp: 60 each, Rfl: 12   glucose blood test strip, Check sugar once daily, E 11.9, Disp: 25 each, Rfl: 12   Homeopathic Products (LEG CRAMP RELIEF PO), Take 2 capsules by mouth daily. , Disp: , Rfl:    hydrochlorothiazide (HYDRODIURIL) 25 MG tablet, Take 1 tablet by mouth once daily, Disp: 90 tablet, Rfl: 0   hyoscyamine (LEVBID) 0.375 MG 12 hr tablet, Take 1 tablet (0.375 mg total) by mouth as needed., Disp: 60 tablet, Rfl: 5   JANUVIA 100 MG tablet, Take 1 tablet by mouth once daily, Disp: 90 tablet, Rfl: 0   Lancets (ONETOUCH ULTRASOFT)  lancets, Check sugar once daily ,DX E11.9, Disp: 30 each, Rfl: 12   losartan (COZAAR) 100 MG tablet, Take 1 tablet by mouth once daily, Disp: 90 tablet, Rfl: 0   montelukast (SINGULAIR) 10 MG tablet, TAKE 1 TABLET BY MOUTH AT BEDTIME, Disp: 90 tablet, Rfl: 3   pantoprazole (PROTONIX) 40 MG tablet, Take 1 tablet by mouth once daily, Disp: 90 tablet, Rfl: 2   rosuvastatin (CRESTOR) 20 MG tablet, Take 1 tablet by mouth once daily, Disp: 90 tablet, Rfl: 3   vitamin B-12 (CYANOCOBALAMIN) 1000 MCG tablet, Take 1,000 mcg by mouth daily., Disp: , Rfl:    Vitamin D, Cholecalciferol, 50 MCG (2000 UT) CAPS, Take 2,000 Units by mouth daily., Disp: , Rfl:    zinc gluconate 50 MG tablet,  Take 50 mg by mouth daily., Disp: , Rfl:   Observations/Objective: Patient is well-developed, well-nourished in no acute distress.  Resting comfortably  at home.  Head is normocephalic, atraumatic.  No labored breathing, no stridor, normal speech Mild swelling to right submandibular area, no erythema Speech is clear and coherent with logical content.  Patient is alert and oriented at baseline.    Assessment and Plan: 1. Sialoadenitis of submandibular gland -Seems consistent with sialoadenitis.  Might have a sialolith, which we discussed that if she gets worse she will need to be seen in person. - Clindamycin - Warm compresses    Follow Up Instructions: I discussed the assessment and treatment plan with the patient. The patient was provided an opportunity to ask questions and all were answered. The patient agreed with the plan and demonstrated an understanding of the instructions.  A copy of instructions were sent to the patient via MyChart unless otherwise noted below.     The patient was advised to call back or seek an in-person evaluation if the symptoms worsen or if the condition fails to improve as anticipated.  Time:  I spent 12 minutes with the patient via telehealth technology discussing the above  problems/concerns.    Montine Circle, PA-C

## 2021-07-26 ENCOUNTER — Ambulatory Visit: Payer: Medicare PPO | Admitting: Family Medicine

## 2021-08-04 ENCOUNTER — Other Ambulatory Visit: Payer: Self-pay | Admitting: Podiatry

## 2021-08-04 ENCOUNTER — Other Ambulatory Visit: Payer: Self-pay | Admitting: Obstetrics & Gynecology

## 2021-08-04 DIAGNOSIS — L97509 Non-pressure chronic ulcer of other part of unspecified foot with unspecified severity: Secondary | ICD-10-CM

## 2021-08-04 DIAGNOSIS — R0989 Other specified symptoms and signs involving the circulatory and respiratory systems: Secondary | ICD-10-CM

## 2021-08-04 DIAGNOSIS — L97511 Non-pressure chronic ulcer of other part of right foot limited to breakdown of skin: Secondary | ICD-10-CM

## 2021-08-13 ENCOUNTER — Other Ambulatory Visit: Payer: Self-pay | Admitting: Family Medicine

## 2021-08-13 DIAGNOSIS — I10 Essential (primary) hypertension: Secondary | ICD-10-CM

## 2021-08-16 ENCOUNTER — Other Ambulatory Visit: Payer: Self-pay

## 2021-08-16 ENCOUNTER — Encounter: Payer: Self-pay | Admitting: Podiatry

## 2021-08-16 ENCOUNTER — Ambulatory Visit: Payer: Medicare PPO | Admitting: Podiatry

## 2021-08-16 ENCOUNTER — Ambulatory Visit (INDEPENDENT_AMBULATORY_CARE_PROVIDER_SITE_OTHER): Payer: Medicare PPO

## 2021-08-16 DIAGNOSIS — E1142 Type 2 diabetes mellitus with diabetic polyneuropathy: Secondary | ICD-10-CM

## 2021-08-16 DIAGNOSIS — L97511 Non-pressure chronic ulcer of other part of right foot limited to breakdown of skin: Secondary | ICD-10-CM | POA: Diagnosis not present

## 2021-08-16 DIAGNOSIS — B351 Tinea unguium: Secondary | ICD-10-CM

## 2021-08-16 DIAGNOSIS — M79676 Pain in unspecified toe(s): Secondary | ICD-10-CM | POA: Diagnosis not present

## 2021-08-16 DIAGNOSIS — L84 Corns and callosities: Secondary | ICD-10-CM

## 2021-08-16 DIAGNOSIS — R0989 Other specified symptoms and signs involving the circulatory and respiratory systems: Secondary | ICD-10-CM

## 2021-08-16 DIAGNOSIS — L97509 Non-pressure chronic ulcer of other part of unspecified foot with unspecified severity: Secondary | ICD-10-CM | POA: Diagnosis not present

## 2021-08-16 DIAGNOSIS — L97521 Non-pressure chronic ulcer of other part of left foot limited to breakdown of skin: Secondary | ICD-10-CM

## 2021-08-16 NOTE — Progress Notes (Signed)
She presents today for follow-up of her ulcerations plantar aspect of the subfifth right and plantar medial hallux right these appear to be healing nicely but there is a do demonstrate some hyperkeratotic tissue overlying a open area of ulceration particular subfifth right.  Also toenails are long thick yellow dystrophic clinically mycotic.  Objective: Pulses remain palpable bilateral.  Areas of reactive hyperkeratotic debrided today do demonstrate superficial ulceration right subfifth after debridement with a chisel blade.  The wound measures less than 5 mm in diameter.  Toenails are long thick yellow dystrophic clinically mycotic.  Assessment: Pain in limb secondary to diabetic peripheral neuropathy diabetic ulceration and pain in limb secondary to onychomycosis.  Plan: Debridement of toenails 1 through 5 bilateral.  Debridement of the ulcerative lesion dressed today dressed a compressive dressing follow-up with her in 2 months.

## 2021-08-17 ENCOUNTER — Ambulatory Visit: Payer: Medicare PPO | Admitting: Cardiovascular Disease

## 2021-08-17 ENCOUNTER — Encounter: Payer: Self-pay | Admitting: Cardiovascular Disease

## 2021-08-17 VITALS — BP 110/70 | HR 88 | Ht 68.0 in | Wt 259.0 lb

## 2021-08-17 DIAGNOSIS — I739 Peripheral vascular disease, unspecified: Secondary | ICD-10-CM | POA: Diagnosis not present

## 2021-08-17 DIAGNOSIS — E785 Hyperlipidemia, unspecified: Secondary | ICD-10-CM | POA: Diagnosis not present

## 2021-08-17 DIAGNOSIS — I10 Essential (primary) hypertension: Secondary | ICD-10-CM

## 2021-08-17 NOTE — H&P (View-Only) (Signed)
Cardiology Office Note   Date:  08/17/2021   ID:  Kiearra, Oyervides June 11, 1956, MRN [id number]  PCP:  Jerrol Banana., MD  Cardiologist:   [name], MD   Chief Complaint  Patient presents with   Other    +ABI No complaints today. Meds reviewed verbally with pt.      History of Present Illness: Karen Stephenson is a 66 y.o. female who was added to my schedule for evaluation after an abnormal vascular study yesterday. She has known history of essential hypertension, hyperlipidemia, previous tobacco use, diabetes mellitus and sleep apnea.  She works at the Insurance underwriter at [employer]. She was seen by Dr. Garen Lah for preoperative cardiovascular evaluation before hysterectomy.  Your EKG was slightly abnormal and thus she underwent a Lexiscan Myoview which showed no evidence of ischemia.  Echocardiogram showed normal LV systolic function and no significant valvular abnormalities. She reports history of bilateral big toe ulceration that started few years ago.  The one on the left heel.  On the right side it seems to be healing but not completely.  In addition, she reports right leg pain with walking and activities that started about 1 years ago and thought that it is due to sciatica.  She underwent noninvasive vascular studies yesterday which showed an ABI of 0.47 on the right and 0.82 on the left.  Duplex showed no significant infrainguinal disease on the right but she was noted to have monophasic waveforms starting at the external iliac artery.  The common iliac artery could not be visualized.  On the left, there was significant epidural artery stenosis.  No chest pain, shortness of breath or palpitations.  Past Medical History:  Diagnosis Date   Anginal pain ([place])    Asthma    Diabetes mellitus without complication ([place])    Family history of adverse reaction to anesthesia    SISTER HAS ISSUE DURING COLONOSCOPY   GERD (gastroesophageal reflux  disease)    Hypercholesteremia    Hypertension    Hypokalemia    Sleep apnea    WAS NOT GIVEN CPAP    Past Surgical History:  Procedure Laterality Date   BACK SURGERY  1986   COLONOSCOPY WITH PROPOFOL N/A 07/17/2017   Procedure: COLONOSCOPY WITH PROPOFOL;  Surgeon: Manya Silvas, MD;  Location: [facility] ENDOSCOPY;  Service: Endoscopy;  Laterality: N/A;   NASAL SINUS SURGERY     TOTAL LAPAROSCOPIC HYSTERECTOMY WITH BILATERAL SALPINGO OOPHORECTOMY Bilateral 08/02/2020   Procedure: TOTAL LAPAROSCOPIC HYSTERECTOMY WITH BILATERAL SALPINGO OOPHORECTOMY;  Surgeon: Gae Dry, MD;  Location: [facility] ORS;  Service: Gynecology;  Laterality: Bilateral;  RN TO ASSIST   UTERINE FIBROID SURGERY       Current Outpatient Medications  Medication Sig Dispense Refill   acarbose (PRECOSE) 25 MG tablet TAKE 1 TABLET BY MOUTH THREE TIMES DAILY WITH MEALS 270 tablet 3   albuterol (VENTOLIN HFA) 108 (90 Base) MCG/ACT inhaler Inhale 2 puffs into the lungs every 6 (six) hours as needed for wheezing or shortness of breath. 8 g 0   aspirin EC 81 MG tablet Take 81 mg by mouth daily. Swallow whole.     BIOTIN PO Take 1 tablet by mouth daily.     cadexomer iodine (IODOSORB) 0.9 % gel Apply 1 application topically daily. 40 g 3   cetirizine (ZYRTEC) 10 MG tablet Take 10 mg by mouth daily.     chlorpheniramine-HYDROcodone (TUSSIONEX PENNKINETIC ER) 10-8 MG/5ML SUER Take 5  mLs by mouth every 12 (twelve) hours as needed for cough. 140 mL 0   empagliflozin (JARDIANCE) 10 MG TABS tablet Take 1 tablet (10 mg total) by mouth daily. 90 tablet 3   fluticasone (FLONASE) 50 MCG/ACT nasal spray Use 2 spray(s) in each nostril once daily 16 g 5   Fluticasone-Salmeterol (ADVAIR) 250-50 MCG/DOSE AEPB INHALE ONE DOSE BY MOUTH AS NEEDED 60 each 12   glucose blood test strip Check sugar once daily, E 11.9 25 each 12   Homeopathic Products (LEG CRAMP RELIEF PO) Take 2 capsules by mouth daily.      hydrochlorothiazide (HYDRODIURIL) 25  MG tablet Take 1 tablet by mouth once daily 90 tablet 0   hyoscyamine (LEVBID) 0.375 MG 12 hr tablet Take 1 tablet (0.375 mg total) by mouth as needed. 60 tablet 5   JANUVIA 100 MG tablet Take 1 tablet by mouth once daily 90 tablet 0   Lancets (ONETOUCH ULTRASOFT) lancets Check sugar once daily ,DX E11.9 30 each 12   losartan (COZAAR) 100 MG tablet Take 1 tablet by mouth once daily 90 tablet 0   montelukast (SINGULAIR) 10 MG tablet TAKE 1 TABLET BY MOUTH AT BEDTIME 90 tablet 3   pantoprazole (PROTONIX) 40 MG tablet Take 1 tablet by mouth once daily 90 tablet 2   rosuvastatin (CRESTOR) 20 MG tablet Take 1 tablet by mouth once daily 90 tablet 3   vitamin B-12 (CYANOCOBALAMIN) 1000 MCG tablet Take 1,000 mcg by mouth daily.     Vitamin D, Cholecalciferol, 50 MCG (2000 UT) CAPS Take 2,000 Units by mouth daily.     zinc gluconate 50 MG tablet Take 50 mg by mouth daily.     No current facility-administered medications for this visit.    Allergies:   Amoxicillin-pot clavulanate, Aspirin, Ducodyl [bisacodyl], Erythromycin, Glipizide, Metformin hcl, Latex, and Macrobid [nitrofurantoin]    Social History:  The patient  reports that she quit smoking about 8 years ago. Her smoking use included cigarettes. She has never used smokeless tobacco. She reports that she does not drink alcohol and does not use drugs.   Family History:  The patient's family history includes Breast cancer (age of onset: 63) in her mother; Breast cancer (age of onset: 59) in her sister; Diabetes in her mother; Heart disease in her father and paternal grandfather; Hypertension in her brother.    ROS:  Please see the history of present illness.   Otherwise, review of systems are positive for none.   All other systems are reviewed and negative.    PHYSICAL EXAM: VS:  BP 110/70 (BP Location: Left Arm, Patient Position: Sitting, Cuff Size: Large)    Pulse 88    Ht 5\' 8"  (1.727 m)    Wt 259 lb (117.5 kg)    SpO2 98%    BMI 39.38 kg/m   , BMI Body mass index is 39.38 kg/m. GEN: Well nourished, well developed, in no acute distress  HEENT: normal  Neck: no JVD, carotid bruits, or masses Cardiac: RRR; no murmurs, rubs, or gallops,no edema  Respiratory:  clear to auscultation bilaterally, normal work of breathing GI: soft, nontender, nondistended, + BS MS: no deformity or atrophy  Skin: warm and dry, no rash Neuro:  Strength and sensation are intact Psych: euthymic mood, full affect Vascular: Radial pulses normal.  Femoral pulses absent on the right and slightly diminished on the left.  Dorsalis pedis: 0 on the right and +1 on the left.  Posterior tibial is nonpalpable.  EKG:  EKG is ordered today. The ekg ordered today demonstrates sinus rhythm with moderate LVH.   Recent Labs: No results found for requested labs within last 8760 hours.    Lipid Panel    Component Value Date/Time   CHOL 110 06/08/2020 1312   TRIG 112 06/08/2020 1312   HDL 32 (L) 06/08/2020 1312   CHOLHDL 3.4 06/08/2020 1312   LDLCALC 57 06/08/2020 1312      Wt Readings from Last 3 Encounters:  08/17/21 259 lb (117.5 kg)  02/15/21 261 lb (118.4 kg)  10/12/20 264 lb (119.7 kg)      PAD Screen 07/25/2020  Previous PAD dx? No  Previous surgical procedure? No  Pain with walking? Yes  Subsides with rest? Yes  Feet/toe relief with dangling? Yes  Painful, non-healing ulcers? No  Extremities discolored? No      ASSESSMENT AND PLAN:  1.  Peripheral arterial disease: The patient has evidence of severe right lower extremity ischemia with recurrent ulcerations as well as claudication.  Her exam and vascular testing is highly suggestive of right common iliac artery occlusion.  She is at a high risk for limb loss considering degree of ischemia, recurrent ulcerations and diabetic status.   I recommend proceeding with abdominal aortogram with lower extremity runoff and possible endovascular intervention.  I discussed the procedure in details as  well as risk and benefits.  Planned access is via the left common femoral artery.  2.  Essential hypertension: Blood pressure is well controlled on current medications.  3.  Hyperlipidemia: She is currently on rosuvastatin.  Most recent lipid profile showed an LDL of 57.  4.  Type 2 diabetes: Most recent hemoglobin A1c was 7.3.    Disposition:   Proceed with an angiogram on March 1 and follow-up 2 weeks after.  Signed,  [name], MD  08/17/2021 9:51 AM    [place]

## 2021-08-17 NOTE — Patient Instructions (Signed)
Medication Instructions:  Your physician recommends that you continue on your current medications as directed. Please refer to the Current Medication list given to you today.  *If you need a refill on your cardiac medications before your next appointment, please call your pharmacy*   Lab Work: Your physician recommends that you return for lab work next week (Bmp, Cbc)  If you have labs (blood work) drawn today and your tests are completely normal, you will receive your results only by: [place] (if you have MyChart) OR A paper copy in the mail If you have any lab test that is abnormal or we need to change your treatment, we will call you to review the results.   Testing/Procedures: Dr. Fletcher Anon has recommended that you have an Abdominal Aortogram. Please see instructions below   Follow-Up: At [facility], you and your health needs are our priority.  As part of our continuing mission to provide you with exceptional heart care, we have created designated Provider Care Teams.  These Care Teams include your primary Cardiologist (physician) and Advanced Practice Providers (APPs -  Physician Assistants and Nurse Practitioners) who all work together to provide you with the care you need, when you need it.  We recommend signing up for the patient portal called "MyChart".  Sign up information is provided on this After Visit Summary.  MyChart is used to connect with patients for Virtual Visits (Telemedicine).  Patients are able to view lab/test results, encounter notes, upcoming appointments, etc.  Non-urgent messages can be sent to your provider as well.   To learn more about what you can do with MyChart, go to [email or web address].    Your next appointment:   4 week(s)  The format for your next appointment:   In Person  Provider:   You may see [name], MD or one of the following Advanced Practice Providers on your designated Care Team:   Murray Hodgkins, NP Christell Faith,  PA-C Cadence Kathlen Mody, PA-C}    Other Instructions  [place] [address] [place] [address] Dept: [phone] Loc: [place]  08/17/2021  You are scheduled for a Peripheral Angiogram on Wednesday, March 1 with Dr. [name].  1. Please arrive at the [facility] (Main Entrance A) at [facility]: [address] [place], [place] [address] at 8:30 AM (This time is two hours before your procedure to ensure your preparation). Free valet parking service is available.   Special note: Every effort is made to have your procedure done on time. Please understand that emergencies sometimes delay scheduled procedures.  2. Diet: Do not eat solid foods after midnight.  The patient may have clear liquids until 5am upon the day of the procedure.  3. Labs: You will need to have blood drawn next week at the [facility] office You do not need to be fasting.  4. Medication instructions in preparation for your procedure:   Contrast Allergy: No    Stop taking, HTCZ (Hydrochlorothiazide) Wednesday, March 1,    Do Not take Januvia the morning of the procedure  On the morning of your procedure, take your Aspirin and any morning medicines NOT listed above.  You may use sips of water.  5. Plan for one night stay--bring personal belongings. 6. Bring a current list of your medications and current insurance cards. 7. You MUST have a responsible person to drive you home. 8. Someone MUST be  with you the first 24 hours after you arrive home or your discharge will be delayed. 9. Please wear clothes that are easy to get on and off and wear slip-on shoes.  Thank you for allowing [place] to care for you!   -- [place] [facility]

## 2021-08-17 NOTE — Progress Notes (Signed)
Cardiology Office Note   Date:  08/17/2021   ID:  Karen Stephenson, Karen Stephenson 07-14-1955, MRN [id number]  PCP:  Jerrol Banana., MD  Cardiologist:   [name], MD   Chief Complaint  Patient presents with   Other    +ABI No complaints today. Meds reviewed verbally with pt.      History of Present Illness: Karen Stephenson is a 66 y.o. female who was added to my schedule for evaluation after an abnormal vascular study yesterday. She has known history of essential hypertension, hyperlipidemia, previous tobacco use, diabetes mellitus and sleep apnea.  She works at the Insurance underwriter at [employer]. She was seen by Dr. Garen Lah for preoperative cardiovascular evaluation before hysterectomy.  Your EKG was slightly abnormal and thus she underwent a Lexiscan Myoview which showed no evidence of ischemia.  Echocardiogram showed normal LV systolic function and no significant valvular abnormalities. She reports history of bilateral big toe ulceration that started few years ago.  The one on the left heel.  On the right side it seems to be healing but not completely.  In addition, she reports right leg pain with walking and activities that started about 1 years ago and thought that it is due to sciatica.  She underwent noninvasive vascular studies yesterday which showed an ABI of 0.47 on the right and 0.82 on the left.  Duplex showed no significant infrainguinal disease on the right but she was noted to have monophasic waveforms starting at the external iliac artery.  The common iliac artery could not be visualized.  On the left, there was significant epidural artery stenosis.  No chest pain, shortness of breath or palpitations.  Past Medical History:  Diagnosis Date   Anginal pain ([place])    Asthma    Diabetes mellitus without complication ([place])    Family history of adverse reaction to anesthesia    SISTER HAS ISSUE DURING COLONOSCOPY   GERD (gastroesophageal reflux  disease)    Hypercholesteremia    Hypertension    Hypokalemia    Sleep apnea    WAS NOT GIVEN CPAP    Past Surgical History:  Procedure Laterality Date   BACK SURGERY  1986   COLONOSCOPY WITH PROPOFOL N/A 07/17/2017   Procedure: COLONOSCOPY WITH PROPOFOL;  Surgeon: Manya Silvas, MD;  Location: [facility] ENDOSCOPY;  Service: Endoscopy;  Laterality: N/A;   NASAL SINUS SURGERY     TOTAL LAPAROSCOPIC HYSTERECTOMY WITH BILATERAL SALPINGO OOPHORECTOMY Bilateral 08/02/2020   Procedure: TOTAL LAPAROSCOPIC HYSTERECTOMY WITH BILATERAL SALPINGO OOPHORECTOMY;  Surgeon: Gae Dry, MD;  Location: [facility] ORS;  Service: Gynecology;  Laterality: Bilateral;  RN TO ASSIST   UTERINE FIBROID SURGERY       Current Outpatient Medications  Medication Sig Dispense Refill   acarbose (PRECOSE) 25 MG tablet TAKE 1 TABLET BY MOUTH THREE TIMES DAILY WITH MEALS 270 tablet 3   albuterol (VENTOLIN HFA) 108 (90 Base) MCG/ACT inhaler Inhale 2 puffs into the lungs every 6 (six) hours as needed for wheezing or shortness of breath. 8 g 0   aspirin EC 81 MG tablet Take 81 mg by mouth daily. Swallow whole.     BIOTIN PO Take 1 tablet by mouth daily.     cadexomer iodine (IODOSORB) 0.9 % gel Apply 1 application topically daily. 40 g 3   cetirizine (ZYRTEC) 10 MG tablet Take 10 mg by mouth daily.     chlorpheniramine-HYDROcodone (TUSSIONEX PENNKINETIC ER) 10-8 MG/5ML SUER Take 5  mLs by mouth every 12 (twelve) hours as needed for cough. 140 mL 0   empagliflozin (JARDIANCE) 10 MG TABS tablet Take 1 tablet (10 mg total) by mouth daily. 90 tablet 3   fluticasone (FLONASE) 50 MCG/ACT nasal spray Use 2 spray(s) in each nostril once daily 16 g 5   Fluticasone-Salmeterol (ADVAIR) 250-50 MCG/DOSE AEPB INHALE ONE DOSE BY MOUTH AS NEEDED 60 each 12   glucose blood test strip Check sugar once daily, E 11.9 25 each 12   Homeopathic Products (LEG CRAMP RELIEF PO) Take 2 capsules by mouth daily.      hydrochlorothiazide (HYDRODIURIL) 25  MG tablet Take 1 tablet by mouth once daily 90 tablet 0   hyoscyamine (LEVBID) 0.375 MG 12 hr tablet Take 1 tablet (0.375 mg total) by mouth as needed. 60 tablet 5   JANUVIA 100 MG tablet Take 1 tablet by mouth once daily 90 tablet 0   Lancets (ONETOUCH ULTRASOFT) lancets Check sugar once daily ,DX E11.9 30 each 12   losartan (COZAAR) 100 MG tablet Take 1 tablet by mouth once daily 90 tablet 0   montelukast (SINGULAIR) 10 MG tablet TAKE 1 TABLET BY MOUTH AT BEDTIME 90 tablet 3   pantoprazole (PROTONIX) 40 MG tablet Take 1 tablet by mouth once daily 90 tablet 2   rosuvastatin (CRESTOR) 20 MG tablet Take 1 tablet by mouth once daily 90 tablet 3   vitamin B-12 (CYANOCOBALAMIN) 1000 MCG tablet Take 1,000 mcg by mouth daily.     Vitamin D, Cholecalciferol, 50 MCG (2000 UT) CAPS Take 2,000 Units by mouth daily.     zinc gluconate 50 MG tablet Take 50 mg by mouth daily.     No current facility-administered medications for this visit.    Allergies:   Amoxicillin-pot clavulanate, Aspirin, Ducodyl [bisacodyl], Erythromycin, Glipizide, Metformin hcl, Latex, and Macrobid [nitrofurantoin]    Social History:  The patient  reports that she quit smoking about 8 years ago. Her smoking use included cigarettes. She has never used smokeless tobacco. She reports that she does not drink alcohol and does not use drugs.   Family History:  The patient's family history includes Breast cancer (age of onset: 73) in her mother; Breast cancer (age of onset: 48) in her sister; Diabetes in her mother; Heart disease in her father and paternal grandfather; Hypertension in her brother.    ROS:  Please see the history of present illness.   Otherwise, review of systems are positive for none.   All other systems are reviewed and negative.    PHYSICAL EXAM: VS:  BP 110/70 (BP Location: Left Arm, Patient Position: Sitting, Cuff Size: Large)    Pulse 88    Ht 5\' 8"  (1.727 m)    Wt 259 lb (117.5 kg)    SpO2 98%    BMI 39.38 kg/m   , BMI Body mass index is 39.38 kg/m. GEN: Well nourished, well developed, in no acute distress  HEENT: normal  Neck: no JVD, carotid bruits, or masses Cardiac: RRR; no murmurs, rubs, or gallops,no edema  Respiratory:  clear to auscultation bilaterally, normal work of breathing GI: soft, nontender, nondistended, + BS MS: no deformity or atrophy  Skin: warm and dry, no rash Neuro:  Strength and sensation are intact Psych: euthymic mood, full affect Vascular: Radial pulses normal.  Femoral pulses absent on the right and slightly diminished on the left.  Dorsalis pedis: 0 on the right and +1 on the left.  Posterior tibial is nonpalpable.  EKG:  EKG is ordered today. The ekg ordered today demonstrates sinus rhythm with moderate LVH.   Recent Labs: No results found for requested labs within last 8760 hours.    Lipid Panel    Component Value Date/Time   CHOL 110 06/08/2020 1312   TRIG 112 06/08/2020 1312   HDL 32 (L) 06/08/2020 1312   CHOLHDL 3.4 06/08/2020 1312   LDLCALC 57 06/08/2020 1312      Wt Readings from Last 3 Encounters:  08/17/21 259 lb (117.5 kg)  02/15/21 261 lb (118.4 kg)  10/12/20 264 lb (119.7 kg)      PAD Screen 07/25/2020  Previous PAD dx? No  Previous surgical procedure? No  Pain with walking? Yes  Subsides with rest? Yes  Feet/toe relief with dangling? Yes  Painful, non-healing ulcers? No  Extremities discolored? No      ASSESSMENT AND PLAN:  1.  Peripheral arterial disease: The patient has evidence of severe right lower extremity ischemia with recurrent ulcerations as well as claudication.  Her exam and vascular testing is highly suggestive of right common iliac artery occlusion.  She is at a high risk for limb loss considering degree of ischemia, recurrent ulcerations and diabetic status.   I recommend proceeding with abdominal aortogram with lower extremity runoff and possible endovascular intervention.  I discussed the procedure in details as  well as risk and benefits.  Planned access is via the left common femoral artery.  2.  Essential hypertension: Blood pressure is well controlled on current medications.  3.  Hyperlipidemia: She is currently on rosuvastatin.  Most recent lipid profile showed an LDL of 57.  4.  Type 2 diabetes: Most recent hemoglobin A1c was 7.3.    Disposition:   Proceed with an angiogram on March 1 and follow-up 2 weeks after.  Signed,  [name], MD  08/17/2021 9:51 AM    [place]

## 2021-08-23 ENCOUNTER — Other Ambulatory Visit (INDEPENDENT_AMBULATORY_CARE_PROVIDER_SITE_OTHER): Payer: Medicare PPO

## 2021-08-23 ENCOUNTER — Other Ambulatory Visit: Payer: Self-pay

## 2021-08-23 DIAGNOSIS — I872 Venous insufficiency (chronic) (peripheral): Secondary | ICD-10-CM | POA: Diagnosis not present

## 2021-08-23 DIAGNOSIS — Z01818 Encounter for other preprocedural examination: Secondary | ICD-10-CM | POA: Diagnosis not present

## 2021-08-23 DIAGNOSIS — I739 Peripheral vascular disease, unspecified: Secondary | ICD-10-CM

## 2021-08-24 LAB — CBC WITH DIFFERENTIAL/PLATELET
Basophils Absolute: 0.1 10*3/uL (ref 0.0–0.2)
Basos: 1 %
EOS (ABSOLUTE): 0.2 10*3/uL (ref 0.0–0.4)
Eos: 2 %
Hematocrit: 42.1 % (ref 34.0–46.6)
Hemoglobin: 13.8 g/dL (ref 11.1–15.9)
Immature Grans (Abs): 0.1 10*3/uL (ref 0.0–0.1)
Immature Granulocytes: 1 %
Lymphocytes Absolute: 1.6 10*3/uL (ref 0.7–3.1)
Lymphs: 24 %
MCH: 27.5 pg (ref 26.6–33.0)
MCHC: 32.8 g/dL (ref 31.5–35.7)
MCV: 84 fL (ref 79–97)
Monocytes Absolute: 0.5 10*3/uL (ref 0.1–0.9)
Monocytes: 8 %
Neutrophils Absolute: 4.2 10*3/uL (ref 1.4–7.0)
Neutrophils: 64 %
Platelets: 260 10*3/uL (ref 150–450)
RBC: 5.01 x10E6/uL (ref 3.77–5.28)
RDW: 12.9 % (ref 11.7–15.4)
WBC: 6.6 10*3/uL (ref 3.4–10.8)

## 2021-08-24 LAB — BASIC METABOLIC PANEL
BUN/Creatinine Ratio: 14 (ref 12–28)
BUN: 13 mg/dL (ref 8–27)
CO2: 22 mmol/L (ref 20–29)
Calcium: 9.7 mg/dL (ref 8.7–10.3)
Chloride: 101 mmol/L (ref 96–106)
Creatinine, Ser: 0.92 mg/dL (ref 0.57–1.00)
Glucose: 164 mg/dL — ABNORMAL HIGH (ref 70–99)
Potassium: 3.8 mmol/L (ref 3.5–5.2)
Sodium: 145 mmol/L — ABNORMAL HIGH (ref 134–144)
eGFR: 68 mL/min/{1.73_m2} (ref >59)

## 2021-08-28 ENCOUNTER — Telehealth: Payer: Self-pay | Admitting: *Deleted

## 2021-08-28 NOTE — Telephone Encounter (Signed)
Abdominal aortogram  scheduled at [facility] for: Wednesday August 30, 2021 10:30 AM [facility] Main Entrance A [facility]) at: 8:30 AM   Diet-no solid food after midnight prior to cath, clear liquids until 5 AM day of procedure.  Medication instructions for procedure: -Hold:  Januvia/Jardiance/Acarbose/HCTZ-AM of procedure -Except hold medications usual morning medications can be taken pre-cath with sips of water including aspirin 81 mg.    Must have responsible adult to drive home post procedure and be with patient first 24 hours after arriving home.  [facility] does allow one visitor to wait in the waiting room during the time you are there.   Patient reports does not currently have any new symptoms concerning for COVID-19 and no household members with COVID-19 like illness.             Reviewed procedure instructions with patient.

## 2021-08-30 ENCOUNTER — Ambulatory Visit (HOSPITAL_COMMUNITY)
Admission: RE | Admit: 2021-08-30 | Discharge: 2021-08-30 | Disposition: A | Discharge status: Home / Self Care | Payer: Medicare PPO | Attending: Cardiovascular Disease | Admitting: Cardiovascular Disease

## 2021-08-30 ENCOUNTER — Other Ambulatory Visit: Payer: Self-pay

## 2021-08-30 ENCOUNTER — Telehealth: Payer: Self-pay

## 2021-08-30 ENCOUNTER — Ambulatory Visit (HOSPITAL_COMMUNITY)
Admission: RE | Disposition: A | Discharge status: Home / Self Care | Payer: Medicare PPO | Source: Home / Self Care | Attending: Cardiovascular Disease

## 2021-08-30 DIAGNOSIS — L97519 Non-pressure chronic ulcer of other part of right foot with unspecified severity: Secondary | ICD-10-CM | POA: Insufficient documentation

## 2021-08-30 DIAGNOSIS — I70211 Atherosclerosis of native arteries of extremities with intermittent claudication, right leg: Secondary | ICD-10-CM | POA: Diagnosis not present

## 2021-08-30 DIAGNOSIS — G4730 Sleep apnea, unspecified: Secondary | ICD-10-CM | POA: Diagnosis not present

## 2021-08-30 DIAGNOSIS — Z87891 Personal history of nicotine dependence: Secondary | ICD-10-CM | POA: Insufficient documentation

## 2021-08-30 DIAGNOSIS — Z79899 Other long term (current) drug therapy: Secondary | ICD-10-CM | POA: Insufficient documentation

## 2021-08-30 DIAGNOSIS — I70235 Atherosclerosis of native arteries of right leg with ulceration of other part of foot: Secondary | ICD-10-CM | POA: Diagnosis not present

## 2021-08-30 DIAGNOSIS — E7800 Pure hypercholesterolemia, unspecified: Secondary | ICD-10-CM | POA: Diagnosis not present

## 2021-08-30 DIAGNOSIS — E11621 Type 2 diabetes mellitus with foot ulcer: Secondary | ICD-10-CM | POA: Diagnosis not present

## 2021-08-30 DIAGNOSIS — I739 Peripheral vascular disease, unspecified: Secondary | ICD-10-CM

## 2021-08-30 DIAGNOSIS — I10 Essential (primary) hypertension: Secondary | ICD-10-CM | POA: Diagnosis not present

## 2021-08-30 DIAGNOSIS — I745 Embolism and thrombosis of iliac artery: Secondary | ICD-10-CM | POA: Diagnosis not present

## 2021-08-30 DIAGNOSIS — E1151 Type 2 diabetes mellitus with diabetic peripheral angiopathy without gangrene: Secondary | ICD-10-CM | POA: Insufficient documentation

## 2021-08-30 HISTORY — PX: PERIPHERAL VASCULAR INTERVENTION: CATH118257

## 2021-08-30 HISTORY — PX: ABDOMINAL AORTOGRAM W/LOWER EXTREMITY: CATH118223

## 2021-08-30 LAB — GLUCOSE, CAPILLARY: Glucose-Capillary: 153 mg/dL — ABNORMAL HIGH (ref 70–99)

## 2021-08-30 SURGERY — ABDOMINAL AORTOGRAM W/LOWER EXTREMITY
Anesthesia: LOCAL | Laterality: Right

## 2021-08-30 MED ORDER — ASPIRIN 81 MG PO CHEW: 81.0000 mg | CHEWABLE_TABLET | ORAL | Status: DC

## 2021-08-30 MED ORDER — IODIXANOL 320 MG/ML IV SOLN
INTRAVENOUS | Status: DC | PRN
  Administered 2021-08-30: 90 mL via INTRA_ARTERIAL

## 2021-08-30 MED ORDER — CLOPIDOGREL BISULFATE 300 MG PO TABS
ORAL_TABLET | ORAL | Status: AC
  Filled 2021-08-30: qty 1

## 2021-08-30 MED ORDER — SODIUM CHLORIDE 0.9 % IV SOLN: INTRAVENOUS | Status: DC

## 2021-08-30 MED ORDER — SODIUM CHLORIDE 0.9% FLUSH: 3.0000 mL | INTRAVENOUS | Status: DC | PRN

## 2021-08-30 MED ORDER — HEPARIN SODIUM (PORCINE) 1000 UNIT/ML IJ SOLN
INTRAMUSCULAR | Status: AC
  Filled 2021-08-30: qty 10

## 2021-08-30 MED ORDER — HEPARIN (PORCINE) IN NACL 1000-0.9 UT/500ML-% IV SOLN
INTRAVENOUS | Status: AC
  Filled 2021-08-30: qty 500

## 2021-08-30 MED ORDER — LABETALOL HCL 5 MG/ML IV SOLN: 10.0000 mg | INTRAVENOUS | Status: DC | PRN

## 2021-08-30 MED ORDER — FENTANYL CITRATE (PF) 100 MCG/2ML IJ SOLN
INTRAMUSCULAR | Status: AC
  Filled 2021-08-30: qty 2

## 2021-08-30 MED ORDER — MIDAZOLAM HCL 2 MG/2ML IJ SOLN
INTRAMUSCULAR | Status: AC
  Filled 2021-08-30: qty 2

## 2021-08-30 MED ORDER — SODIUM CHLORIDE 0.9% FLUSH: 3.0000 mL | Freq: Two times a day (BID) | INTRAVENOUS | Status: DC

## 2021-08-30 MED ORDER — SODIUM CHLORIDE 0.9 % IV SOLN: 250.0000 mL | INTRAVENOUS | Status: DC | PRN

## 2021-08-30 MED ORDER — HEPARIN SODIUM (PORCINE) 1000 UNIT/ML IJ SOLN
INTRAMUSCULAR | Status: DC | PRN
  Administered 2021-08-30: 8000 [IU] via INTRAVENOUS
  Administered 2021-08-30: 2000 [IU] via INTRAVENOUS

## 2021-08-30 MED ORDER — CLOPIDOGREL BISULFATE 300 MG PO TABS
ORAL_TABLET | ORAL | Status: DC | PRN
  Administered 2021-08-30: 300 mg via ORAL

## 2021-08-30 MED ORDER — CLOPIDOGREL BISULFATE 75 MG PO TABS: 75.0000 mg | ORAL_TABLET | Freq: Every day | ORAL | 11 refills | Status: DC

## 2021-08-30 MED ORDER — MIDAZOLAM HCL 2 MG/2ML IJ SOLN
INTRAMUSCULAR | Status: DC | PRN
  Administered 2021-08-30: 1 mg via INTRAVENOUS

## 2021-08-30 MED ORDER — FENTANYL CITRATE (PF) 100 MCG/2ML IJ SOLN
INTRAMUSCULAR | Status: DC | PRN
  Administered 2021-08-30: 50 ug via INTRAVENOUS

## 2021-08-30 MED ORDER — LIDOCAINE HCL (PF) 1 % IJ SOLN
INTRAMUSCULAR | Status: AC
  Filled 2021-08-30: qty 30

## 2021-08-30 MED ORDER — ONDANSETRON HCL 4 MG/2ML IJ SOLN: 4.0000 mg | Freq: Four times a day (QID) | INTRAMUSCULAR | Status: DC | PRN

## 2021-08-30 MED ORDER — ACETAMINOPHEN 325 MG PO TABS: 650.0000 mg | ORAL_TABLET | ORAL | Status: DC | PRN

## 2021-08-30 MED ORDER — LIDOCAINE HCL (PF) 1 % IJ SOLN
INTRAMUSCULAR | Status: DC | PRN
  Administered 2021-08-30: 10 mL via INTRADERMAL

## 2021-08-30 SURGICAL SUPPLY — 18 items
CATH ANGIO 5F PIGTAIL 65CM (CATHETERS) ×1 IMPLANT
CATH CROSS OVER TEMPO 5F (CATHETERS) ×1 IMPLANT
CATH NAVICROSS ANG 65CM (CATHETERS) IMPLANT
CATHETER NAVICROSS ANG 65CM (CATHETERS) ×3
CLOSURE PERCLOSE PROSTYLE (VASCULAR PRODUCTS) ×1 IMPLANT
GLIDEWIRE ADV .035X260CM (WIRE) ×1 IMPLANT
KIT ENCORE 26 ADVANTAGE (KITS) ×1 IMPLANT
KIT MICROPUNCTURE NIT STIFF (SHEATH) ×1 IMPLANT
KIT PV (KITS) ×4 IMPLANT
SHEATH PINNACLE 5F 10CM (SHEATH) ×1 IMPLANT
SHEATH PINNACLE MP 7F 45CM (SHEATH) ×1 IMPLANT
STENT VIABAHNBX 8X29X135 (Permanent Stent) ×1 IMPLANT
STOPCOCK MORSE 400PSI 3WAY (MISCELLANEOUS) ×1 IMPLANT
SYR MEDRAD MARK 7 150ML (SYRINGE) ×4 IMPLANT
TRANSDUCER W/STOPCOCK (MISCELLANEOUS) ×4 IMPLANT
TRAY PV CATH (CUSTOM PROCEDURE TRAY) ×4 IMPLANT
TUBING CIL FLEX 10 FLL-RA (TUBING) ×1 IMPLANT
WIRE HI TORQ VERSACORE-J 145CM (WIRE) ×1 IMPLANT

## 2021-08-30 NOTE — Interval H&P Note (Signed)
History and Physical Interval Note: ? ?08/30/2021 ?10:09 AM ? ?[name]  has presented today for surgery, with the diagnosis of PAD.  The various methods of treatment have been discussed with the patient and family. After consideration of risks, benefits and other options for treatment, the patient has consented to  Procedure(s): ?ABDOMINAL AORTOGRAM W/LOWER EXTREMITY (N/A) as a surgical intervention.  The patient's history has been reviewed, patient examined, no change in status, stable for surgery.  I have reviewed the patient's chart and labs.  Questions were answered to the patient's satisfaction.   ? ? ?[name] ? ? ?

## 2021-08-30 NOTE — Telephone Encounter (Signed)
-----   Message from Wellington Hampshire, MD sent at 08/30/2021  1:10 PM EST ----- ?Schedule an ABI and aortoiliac duplex. ? ?

## 2021-08-30 NOTE — Telephone Encounter (Signed)
Orders place for post PV procedure testing. ?Msg fwd to scheduling to call the patient to schedule. ?

## 2021-08-31 ENCOUNTER — Encounter (HOSPITAL_COMMUNITY): Payer: Self-pay | Admitting: Cardiovascular Disease

## 2021-08-31 MED FILL — Heparin Sod (Porcine)-NaCl IV Soln 1000 Unit/500ML-0.9%: INTRAVENOUS | Qty: 1000 | Status: AC

## 2021-09-01 ENCOUNTER — Telehealth: Payer: Self-pay | Admitting: Cardiovascular Disease

## 2021-09-01 NOTE — Telephone Encounter (Signed)
Patient is not sure why she needs follow up ultrasounds. Please call to discuss.

## 2021-09-01 NOTE — Telephone Encounter (Signed)
Spoke with the patient. Patient made aware of the need for post-procedure PV testing. ?Patient verbalized understanding and is agreeable to schedule. ? ?

## 2021-09-04 NOTE — Telephone Encounter (Signed)
fyi ? ?Attempted to schedule testing within 2 weeks.  Patient declined opening at [place] office this week as she doesn't want to commute to [place]  ? ?Scheduled next available 3/24 for aorta and abi .  Patient aware this is not within the time frame requested by provider but still declined sooner in [place].   ? ?Added to waitlist and will attempt to see sooner if cancellation available in  only.  ? ? ? ?

## 2021-09-04 NOTE — Telephone Encounter (Signed)
Noted  

## 2021-09-06 ENCOUNTER — Other Ambulatory Visit: Payer: Self-pay | Admitting: Family Medicine

## 2021-09-06 DIAGNOSIS — E119 Type 2 diabetes mellitus without complications: Secondary | ICD-10-CM

## 2021-09-07 NOTE — Telephone Encounter (Signed)
Requested medication (s) are due for refill today: yes ? ?Requested medication (s) are on the active medication list: yes ? ?Last refill:  06/12/21 #90/0 ? ?Future visit scheduled: yes ? ?Notes to clinic:  Unable to refill per protocol due to failed labs, no updated results. ? ? ? ?  ?Requested Prescriptions  ?Pending Prescriptions Disp Refills  ? JANUVIA 100 MG tablet [Pharmacy Med Name: Januvia 100 MG Oral Tablet] 90 tablet 0  ?  Sig: Take 1 tablet by mouth once daily  ?  ? Endocrinology:  Diabetes - DPP-4 Inhibitors Failed - 09/06/2021  3:24 PM  ?  ?  Failed - HBA1C is between 0 and 7.9 and within 180 days  ?  Hemoglobin A1C  ?Date Value Ref Range Status  ?02/15/2021 7.3 (A) 4.0 - 5.6 % Final  ? ?Hgb A1c MFr Bld  ?Date Value Ref Range Status  ?06/08/2020 8.4 (H) 4.8 - 5.6 % Final  ?  Comment:  ?           Prediabetes: 5.7 - 6.4 ?         Diabetes: >6.4 ?         Glycemic control for adults with diabetes: <7.0 ?  ?  ?  ?  ?  Failed - Valid encounter within last 6 months  ?  Recent Outpatient Visits   ? ?      ? 6 months ago Type 2 diabetes mellitus with diabetic polyneuropathy, without long-term current use of insulin ([place])  ? [facility] Jerrol Banana., MD  ? 11 months ago Type 2 diabetes mellitus with diabetic polyneuropathy, without long-term current use of insulin ([place])  ? [facility] Jerrol Banana., MD  ? 1 year ago Pre-op examination  ? [facility] Jerrol Banana., MD  ? 1 year ago Cough  ? [facility] Jerrol Banana., MD  ? 1 year ago Type 2 diabetes mellitus with diabetic polyneuropathy, without long-term current use of insulin ([place])  ? [facility] Jerrol Banana., MD  ? ?  ?  ?Future Appointments   ? ?        ? In 1 week Theora Gianotti, NP [facility]  ? In 2 months Jerrol Banana., MD [facility]  ? ?  ? ?  ?  ?  Passed - Cr in  normal range and within 360 days  ?  Creatinine, Ser  ?Date Value Ref Range Status  ?08/23/2021 0.92 0.57 - 1.00 mg/dL Final  ?  ?  ?  ?  ? ?

## 2021-09-08 ENCOUNTER — Other Ambulatory Visit: Payer: Self-pay | Admitting: Family Medicine

## 2021-09-08 DIAGNOSIS — E119 Type 2 diabetes mellitus without complications: Secondary | ICD-10-CM

## 2021-09-08 NOTE — Telephone Encounter (Signed)
Pharmacy received will be ready this evening ?Requested Prescriptions  ?Pending Prescriptions Disp Refills  ?? JANUVIA 100 MG tablet [Pharmacy Med Name: Januvia 100 MG Oral Tablet] 90 tablet 0  ?  Sig: Take 1 tablet by mouth once daily  ?  ? Endocrinology:  Diabetes - DPP-4 Inhibitors Failed - 09/08/2021  4:04 PM  ?  ?  Failed - HBA1C is between 0 and 7.9 and within 180 days  ?  Hemoglobin A1C  ?Date Value Ref Range Status  ?02/15/2021 7.3 (A) 4.0 - 5.6 % Final  ? ?Hgb A1c MFr Bld  ?Date Value Ref Range Status  ?06/08/2020 8.4 (H) 4.8 - 5.6 % Final  ?  Comment:  ?           Prediabetes: 5.7 - 6.4 ?         Diabetes: >6.4 ?         Glycemic control for adults with diabetes: <7.0 ?  ?   ?  ?  Failed - Valid encounter within last 6 months  ?  Recent Outpatient Visits   ?      ? 6 months ago Type 2 diabetes mellitus with diabetic polyneuropathy, without long-term current use of insulin ([place])  ? [facility] Jerrol Banana., MD  ? 11 months ago Type 2 diabetes mellitus with diabetic polyneuropathy, without long-term current use of insulin ([place])  ? [facility] Jerrol Banana[facility] MD  ? 1 year ago Pre-op examination  ? [facility] Jerrol Banana., MD  ? 1 year ago Cough  ? [facility] Jerrol Banana., MD  ? 1 year ago Type 2 diabetes mellitus with diabetic polyneuropathy, without long-term current use of insulin ([place])  ? [facility] Jerrol Banana., MD  ?  ?  ?Future Appointments   ?        ? In 6 days Theora Gianotti, NP [facility], [facility]  ? In 2 months Jerrol Banana., MD [facility], [facility]  ?  ? ?  ?  ?  Passed - Cr in normal range and within 360 days  ?  Creatinine, Ser  ?Date Value Ref Range Status  ?08/23/2021 0.92 0.57 - 1.00 mg/dL Final  ?   ?  ?  ? ? ?

## 2021-09-08 NOTE — Telephone Encounter (Signed)
[place] called and spoke to [facility], [facility] about the refill(s) Januvia requested. Advised it was sent on 09/07/21 #90/0 refill(s). He states he recevied the rx and will go ahead and get it ready for pt to p/up.  ?

## 2021-09-13 ENCOUNTER — Other Ambulatory Visit: Payer: Self-pay | Admitting: Cardiovascular Disease

## 2021-09-13 DIAGNOSIS — I739 Peripheral vascular disease, unspecified: Secondary | ICD-10-CM

## 2021-09-14 ENCOUNTER — Ambulatory Visit: Payer: Medicare PPO | Admitting: Nurse Practitioner

## 2021-09-14 ENCOUNTER — Encounter: Payer: Self-pay | Admitting: Nurse Practitioner

## 2021-09-14 ENCOUNTER — Other Ambulatory Visit: Payer: Self-pay

## 2021-09-14 VITALS — BP 130/74 | HR 86 | Ht 68.0 in | Wt 261.1 lb

## 2021-09-14 DIAGNOSIS — I739 Peripheral vascular disease, unspecified: Secondary | ICD-10-CM | POA: Diagnosis not present

## 2021-09-14 DIAGNOSIS — E7800 Pure hypercholesterolemia, unspecified: Secondary | ICD-10-CM | POA: Diagnosis not present

## 2021-09-14 DIAGNOSIS — I10 Essential (primary) hypertension: Secondary | ICD-10-CM | POA: Diagnosis not present

## 2021-09-14 DIAGNOSIS — E1142 Type 2 diabetes mellitus with diabetic polyneuropathy: Secondary | ICD-10-CM | POA: Diagnosis not present

## 2021-09-14 NOTE — [place] Notes (Signed)
? ? ?Office Visit  ?  ?Patient Name: Karen Stephenson ?Date of Encounter: 09/14/2021 ? ?Primary Care Provider:  Jerrol Banana., MD ?Primary Cardiologist:  Kate Sable, MD ? ?Chief Complaint  ?  ?66 year old female with a history of hypertension, hyperlipidemia, diabetes, sleep apnea, chest pain, and peripheral arterial disease, who presents for follow-up after recent right lower extremity peripheral arterial intervention. ? ?Past Medical History  ?  ?Past Medical History:  ?Diagnosis Date  ? Anginal pain ([place])   ? a. 07/2020 MV: No ischemia/infarct-->low risk.  ? Asthma   ? Diabetes mellitus without complication ([place])   ? Family history of adverse reaction to anesthesia   ? SISTER HAS ISSUE DURING COLONOSCOPY  ? GERD (gastroesophageal reflux disease)   ? History of echocardiogram   ? a. 07/2020 Echo: EF 60-65%, no rwma, nl RV fxn, mild Ao sclerosis w/o stenosis.  ? Hypercholesteremia   ? Hypertension   ? Hypokalemia   ? PAD (peripheral artery disease) ([place])   ? a. 08/2021 LE Angio/PTA: Heavily calcified short occlusion of the R CIA (s/p balloon expandable covered stent), mild nonobstructive infrainguinal disease.  Mild left SFA disease.  Borderline significant L popliteal disease with three-vessel runoff below the knee.  ? Sleep apnea   ? WAS NOT GIVEN CPAP  ? ?Past Surgical History:  ?Procedure Laterality Date  ? ABDOMINAL AORTOGRAM W/LOWER EXTREMITY N/A 08/30/2021  ? Procedure: ABDOMINAL AORTOGRAM W/LOWER EXTREMITY;  Surgeon: Wellington Hampshire, MD;  Location: [facility];  Service: Cardiovascular;  Laterality: N/A;  ? [place]  ? COLONOSCOPY WITH PROPOFOL N/A 07/17/2017  ? Procedure: COLONOSCOPY WITH PROPOFOL;  Surgeon: Manya Silvas, MD;  Location: [facility] ENDOSCOPY;  Service: Endoscopy;  Laterality: N/A;  ? NASAL SINUS SURGERY    ? PERIPHERAL VASCULAR INTERVENTION Right 08/30/2021  ? Procedure: PERIPHERAL VASCULAR INTERVENTION;  Surgeon: Wellington Hampshire, MD;  Location: [facility];   Service: Cardiovascular;  Laterality: Right;  common Iliac  ? TOTAL LAPAROSCOPIC HYSTERECTOMY WITH BILATERAL SALPINGO OOPHORECTOMY Bilateral 08/02/2020  ? Procedure: TOTAL LAPAROSCOPIC HYSTERECTOMY WITH BILATERAL SALPINGO OOPHORECTOMY;  Surgeon: Gae Dry, MD;  Location: [facility] ORS;  Service: Gynecology;  Laterality: Bilateral;  RN TO ASSIST  ? UTERINE FIBROID SURGERY    ? ? ?Allergies ? ?Allergies  ?Allergen Reactions  ? Amoxicillin-Pot Clavulanate Diarrhea  ? Aspirin   ?  Higher doses - passed out   ? Ducodyl [Bisacodyl]   ?  Passed out, extreme cramping   ? Erythromycin   ?  Stomach Ache ?Stomach pain.  ? Glipizide   ?  Extreme stomach aches   ? Metformin Hcl   ?  Extreme stomach cramps   ? Latex Rash  ? Macrobid [Nitrofurantoin]   ?  Severe diarrhea, severe abdominal pain/cramping  ? ? ?History of Present Illness  ?  ?66 year old female with above past medical history including hypertension, hyperlipidemia, diabetes, sleep apnea, chest pain, and peripheral arterial disease.  In the setting of abnormal EKG and need for preoperative evaluation, she underwent stress testing in January 2022, which showed no evidence of ischemia or infarct.  EF was calculated at 40% on stress testing and she subsequently underwent echocardiogram which showed an EF of 60 to 65% without regional wall motion abnormalities.  More recently, she was referred to Dr. Fletcher Anon in the setting of bilateral big toe and left heel ulcerations.  She also reported a 1 year history of right lower extremity claudication.  ABIs  were abnormal at 0.47 on the right and 0.82 on the left.  She had monophasic waveforms starting at the right external iliac artery.  She subsequently underwent abdominal aortic and lower extremity angiography on March 1, which revealed heavily calcified short occlusion of the right common iliac artery with mild nonobstructive infrainguinal disease.  There was mild left SFA disease with borderline significant left popliteal  disease and three-vessel runoff below the knee.  She underwent balloon expandable covered stenting of the right common iliac artery with good result and recommendation for at least 6 months of dual antiplatelet therapy. ? ?Since her right lower extremity intervention, she has had complete resolution of lower extremity claudication.  She has been walking distances that she would have been reluctant to try and walk for several months now.  She is very pleased with her new activity tolerance.  She denies chest pain, dyspnea, palpitations, PND, orthopnea, dizziness, syncope, edema, or early satiety.  Her left groin angiography site has been healing well. ? ?Home Medications  ?  ?Current Outpatient Medications  ?Medication Sig Dispense Refill  ? acarbose (PRECOSE) 25 MG tablet TAKE 1 TABLET BY MOUTH THREE TIMES DAILY WITH MEALS 270 tablet 3  ? albuterol (VENTOLIN HFA) 108 (90 Base) MCG/ACT inhaler Inhale 2 puffs into the lungs every 6 (six) hours as needed for wheezing or shortness of breath. 8 g 0  ? aspirin EC 81 MG tablet Take 81 mg by mouth daily. Swallow whole.    ? BIOTIN PO Take 1 tablet by mouth daily.    ? cetirizine (ZYRTEC) 10 MG tablet Take 10 mg by mouth daily.    ? clopidogrel (PLAVIX) 75 MG tablet Take 1 tablet (75 mg total) by mouth daily. 30 tablet 11  ? empagliflozin (JARDIANCE) 10 MG TABS tablet Take 1 tablet (10 mg total) by mouth daily. 90 tablet 3  ? fluticasone (FLONASE) 50 MCG/ACT nasal spray Use 2 spray(s) in each nostril once daily (Patient taking differently: Place 1 spray into both nostrils daily.) 16 g 5  ? Fluticasone-Salmeterol (ADVAIR) 250-50 MCG/DOSE AEPB INHALE ONE DOSE BY MOUTH AS NEEDED (Patient taking differently: Inhale 1 puff into the lungs daily as needed (asthma).) 60 each 12  ? glucose blood test strip Check sugar once daily, E 11.9 25 each 12  ? Homeopathic Products (LEG CRAMP RELIEF PO) Take 2 capsules by mouth daily.     ? hydrochlorothiazide (HYDRODIURIL) 25 MG tablet Take 1  tablet by mouth once daily 90 tablet 0  ? hyoscyamine (LEVBID) 0.375 MG 12 hr tablet Take 1 tablet (0.375 mg total) by mouth as needed. (Patient taking differently: Take 0.375 mg by mouth every 12 (twelve) hours as needed for cramping.) 60 tablet 5  ? JANUVIA 100 MG tablet Take 1 tablet by mouth once daily 90 tablet 0  ? Lancets (ONETOUCH ULTRASOFT) lancets Check sugar once daily ,DX E11.9 30 each 12  ? losartan (COZAAR) 100 MG tablet Take 1 tablet by mouth once daily 90 tablet 0  ? montelukast (SINGULAIR) 10 MG tablet TAKE 1 TABLET BY MOUTH AT BEDTIME 90 tablet 3  ? Multiple Vitamins-Minerals (MULTIVITAMIN WITH MINERALS) tablet Take 1 tablet by mouth daily.    ? pantoprazole (PROTONIX) 40 MG tablet Take 1 tablet by mouth once daily 90 tablet 2  ? rosuvastatin (CRESTOR) 20 MG tablet Take 1 tablet by mouth once daily 90 tablet 3  ? vitamin B-12 (CYANOCOBALAMIN) 1000 MCG tablet Take 1,000 mcg by mouth daily.    ?  Vitamin D, Cholecalciferol, 50 MCG (2000 UT) CAPS Take 2,000 Units by mouth daily.    ? zinc gluconate 50 MG tablet Take 50 mg by mouth daily.    ? chlorpheniramine-HYDROcodone (TUSSIONEX PENNKINETIC ER) 10-8 MG/5ML SUER Take 5 mLs by mouth every 12 (twelve) hours as needed for cough. (Patient not taking: Reported on 08/23/2021) 140 mL 0  ? ?No current facility-administered medications for this visit.  ?  ? ?Review of Systems  ?  ?Significant improvement in activity tolerance following PTA.  She denies chest pain, dyspnea, palpitations, PND, orthopnea, dizziness, syncope, edema, claudication, or early satiety.  All other systems reviewed and are otherwise negative except as noted above. ?  ? ?Physical Exam  ?  ?VS:  BP 140/90 (BP Location: Left Arm, Patient Position: Sitting, Cuff Size: Large)   Pulse 86   Ht '5\' 8"'$  (1.727 m)   Wt 261 lb 2 oz (118.4 kg)   SpO2 98%   BMI 39.70 kg/m?  , BMI Body mass index is 39.7 kg/m?. ?    ?Vitals:  ? 09/14/21 0921 09/14/21 1057  ?BP: 140/90 130/74  ?Pulse: 86   ?SpO2:  98%   ?  ?GEN: Well nourished, well developed, in no acute distress. ?HEENT: normal. ?Neck: Supple, no JVD, carotid bruits, or masses. ?Cardiac: RRR, no murmurs, rubs, or gallops. No clubbing, cyanosis, edema.

## 2021-09-14 NOTE — Patient Instructions (Signed)
Medication Instructions:  ?No changes at this time.  ? ?*If you need a refill on your cardiac medications before your next appointment, please call your pharmacy* ? ? ?Lab Work: ?None ? ?If you have labs (blood work) drawn today and your tests are completely normal, you will receive your results only by: ?MyChart Message (if you have MyChart) OR ?A paper copy in the mail ?If you have any lab test that is abnormal or we need to change your treatment, we will call you to review the results. ? ? ?Testing/Procedures: ?None ? ? ?Follow-Up: ?At [facility], you and your health needs are our priority.  As part of our continuing mission to provide you with exceptional heart care, we have created designated Provider Care Teams.  These Care Teams include your primary Cardiologist (physician) and Advanced Practice Providers (APPs -  Physician Assistants and Nurse Practitioners) who all work together to provide you with the care you need, when you need it. ? ? ?Your next appointment:   ?6 month(s) ? ?The format for your next appointment:   ?In Person ? ?Provider:   ?[name], MD ?

## 2021-09-22 ENCOUNTER — Ambulatory Visit (INDEPENDENT_AMBULATORY_CARE_PROVIDER_SITE_OTHER): Payer: Medicare PPO

## 2021-09-22 ENCOUNTER — Other Ambulatory Visit: Payer: Self-pay

## 2021-09-22 ENCOUNTER — Other Ambulatory Visit: Payer: Self-pay | Admitting: Cardiovascular Disease

## 2021-09-22 DIAGNOSIS — I739 Peripheral vascular disease, unspecified: Secondary | ICD-10-CM | POA: Diagnosis not present

## 2021-09-22 DIAGNOSIS — Z95828 Presence of other vascular implants and grafts: Secondary | ICD-10-CM | POA: Diagnosis not present

## 2021-09-22 NOTE — Telephone Encounter (Signed)
Patient made aware of PV test results and Dr. Tyrell Antonio recommendation. ?Orders already placed for 6 mo repeat PV testing. ?

## 2021-09-22 NOTE — Telephone Encounter (Signed)
-----   Message from Wellington Hampshire, MD sent at 09/22/2021  3:17 PM EDT ----- ?Improved ABI on the right side and patent right iliac stents.  Repeat studies in 6 months. ? ?

## 2021-10-04 ENCOUNTER — Ambulatory Visit (INDEPENDENT_AMBULATORY_CARE_PROVIDER_SITE_OTHER): Payer: Medicare PPO

## 2021-10-04 VITALS — Wt 261.0 lb

## 2021-10-04 DIAGNOSIS — Z0000 Encounter for general adult medical examination without abnormal findings: Secondary | ICD-10-CM

## 2021-10-04 DIAGNOSIS — Z780 Asymptomatic menopausal state: Secondary | ICD-10-CM | POA: Diagnosis not present

## 2021-10-04 NOTE — Patient Instructions (Addendum)
Karen Stephenson , ?Thank you for taking time to come for your Medicare Wellness Visit. I appreciate your ongoing commitment to your health goals. Please review the following plan we discussed and let me know if I can assist you in the future.  ? ?Screening recommendations/referrals: ?Colonoscopy: 07/17/17 ?Mammogram: 01/18/21 ?Bone Density: ordered ?Recommended yearly ophthalmology/optometry visit for glaucoma screening and checkup ?Recommended yearly dental visit for hygiene and checkup ? ?Vaccinations: ?Influenza vaccine: n/c ?Pneumococcal vaccine: 04/06/20 ?Tdap vaccine: 04/18/11, due if have injury ?Shingles vaccine: n/c   ?Covid-19:10/31/19, 11/24/19, 06/30/20 ? ?Advanced directives: no ? ?Conditions/risks identified: none ? ?Next appointment: Follow up in one year for your annual wellness visit - 10/10/22 @ 10am by phone ? ? ?Preventive Care 68 Years and Older, Female ?Preventive care refers to lifestyle choices and visits with your health care provider that can promote health and wellness. ?What does preventive care include? ?A yearly physical exam. This is also called an annual well check. ?Dental exams once or twice a year. ?Routine eye exams. Ask your health care provider how often you should have your eyes checked. ?Personal lifestyle choices, including: ?Daily care of your teeth and gums. ?Regular physical activity. ?Eating a healthy diet. ?Avoiding tobacco and drug use. ?Limiting alcohol use. ?Practicing safe sex. ?Taking low-dose aspirin every day. ?Taking vitamin and mineral supplements as recommended by your health care provider. ?What happens during an annual well check? ?The services and screenings done by your health care provider during your annual well check will depend on your age, overall health, lifestyle risk factors, and family history of disease. ?Counseling  ?Your health care provider may ask you questions about your: ?Alcohol use. ?Tobacco use. ?Drug use. ?Emotional well-being. ?Home and relationship  well-being. ?Sexual activity. ?Eating habits. ?History of falls. ?Memory and ability to understand (cognition). ?Work and work Statistician. ?Reproductive health. ?Screening  ?You may have the following tests or measurements: ?Height, weight, and BMI. ?Blood pressure. ?Lipid and cholesterol levels. These may be checked every 5 years, or more frequently if you are over 80 years old. ?Skin check. ?Lung cancer screening. You may have this screening every year starting at age 68 if you have a 30-pack-year history of smoking and currently smoke or have quit within the past 68 years. ?Fecal occult blood test (FOBT) of the stool. You may have this test every year starting at age 68. ?Flexible sigmoidoscopy or colonoscopy. You may have a sigmoidoscopy every 5 years or a colonoscopy every 10 years starting at age 68. ?Hepatitis C blood test. ?Hepatitis B blood test. ?Sexually transmitted disease (STD) testing. ?Diabetes screening. This is done by checking your blood sugar (glucose) after you have not eaten for a while (fasting). You may have this done every 1-3 years. ?Bone density scan. This is done to screen for osteoporosis. You may have this done starting at age 68. ?Mammogram. This may be done every 1-2 years. Talk to your health care provider about how often you should have regular mammograms. ?Talk with your health care provider about your test results, treatment options, and if necessary, the need for more tests. ?Vaccines  ?Your health care provider may recommend certain vaccines, such as: ?Influenza vaccine. This is recommended every year. ?Tetanus, diphtheria, and acellular pertussis (Tdap, Td) vaccine. You may need a Td booster every 10 years. ?Zoster vaccine. You may need this after age 68. ?Pneumococcal 13-valent conjugate (PCV13) vaccine. One dose is recommended after age 68. ?Pneumococcal polysaccharide (PPSV23) vaccine. One dose is recommended after age 68. ?Talk  to your health care provider about which  screenings and vaccines you need and how often you need them. ?This information is not intended to replace advice given to you by your health care provider. Make sure you discuss any questions you have with your health care provider. ?Document Released: 07/15/2015 Document Revised: 03/07/2016 Document Reviewed: 04/19/2015 ?Elsevier Interactive Patient Education ? 2017 [place]. ? ?Fall Prevention in the Home ?Falls can cause injuries. They can happen to people of all ages. There are many things you can do to make your home safe and to help prevent falls. ?What can I do on the outside of my home? ?Regularly fix the edges of walkways and driveways and fix any cracks. ?Remove anything that might make you trip as you walk through a door, such as a raised step or threshold. ?Trim any bushes or trees on the path to your home. ?Use bright outdoor lighting. ?Clear any walking paths of anything that might make someone trip, such as rocks or tools. ?Regularly check to see if handrails are loose or broken. Make sure that both sides of any steps have handrails. ?Any raised decks and porches should have guardrails on the edges. ?Have any leaves, snow, or ice cleared regularly. ?Use sand or salt on walking paths during winter. ?Clean up any spills in your garage right away. This includes oil or grease spills. ?What can I do in the bathroom? ?Use night lights. ?Install grab bars by the toilet and in the tub and shower. Do not use towel bars as grab bars. ?Use non-skid mats or decals in the tub or shower. ?If you need to sit down in the shower, use a plastic, non-slip stool. ?Keep the floor dry. Clean up any water that spills on the floor as soon as it happens. ?Remove soap buildup in the tub or shower regularly. ?Attach bath mats securely with double-sided non-slip rug tape. ?Do not have throw rugs and other things on the floor that can make you trip. ?What can I do in the bedroom? ?Use night lights. ?Make sure that you have a  light by your bed that is easy to reach. ?Do not use any sheets or blankets that are too big for your bed. They should not hang down onto the floor. ?Have a firm chair that has side arms. You can use this for support while you get dressed. ?Do not have throw rugs and other things on the floor that can make you trip. ?What can I do in the kitchen? ?Clean up any spills right away. ?Avoid walking on wet floors. ?Keep items that you use a lot in easy-to-reach places. ?If you need to reach something above you, use a strong step stool that has a grab bar. ?Keep electrical cords out of the way. ?Do not use floor polish or wax that makes floors slippery. If you must use wax, use non-skid floor wax. ?Do not have throw rugs and other things on the floor that can make you trip. ?What can I do with my stairs? ?Do not leave any items on the stairs. ?Make sure that there are handrails on both sides of the stairs and use them. Fix handrails that are broken or loose. Make sure that handrails are as long as the stairways. ?Check any carpeting to make sure that it is firmly attached to the stairs. Fix any carpet that is loose or worn. ?Avoid having throw rugs at the top or bottom of the stairs. If you do have throw rugs,  attach them to the floor with carpet tape. ?Make sure that you have a light switch at the top of the stairs and the bottom of the stairs. If you do not have them, ask someone to add them for you. ?What else can I do to help prevent falls? ?Wear shoes that: ?Do not have high heels. ?Have rubber bottoms. ?Are comfortable and fit you well. ?Are closed at the toe. Do not wear sandals. ?If you use a stepladder: ?Make sure that it is fully opened. Do not climb a closed stepladder. ?Make sure that both sides of the stepladder are locked into place. ?Ask someone to hold it for you, if possible. ?Clearly mark and make sure that you can see: ?Any grab bars or handrails. ?First and last steps. ?Where the edge of each step  is. ?Use tools that help you move around (mobility aids) if they are needed. These include: ?Canes. ?Walkers. ?Scooters. ?Crutches. ?Turn on the lights when you go into a dark area. Replace any light bulbs as

## 2021-10-04 NOTE — Progress Notes (Signed)
Virtual Visit via Telephone Note  I connected with  Karen Stephenson on 10/04/21 at 11:00 AM EDT by telephone and verified that I am speaking with the correct person using two identifiers.  Location: Patient: home Provider: [facility] Persons participating in the virtual visit: [place]   I discussed the limitations, risks, security and privacy concerns of performing an evaluation and management service by telephone and the availability of in person appointments. The patient expressed understanding and agreed to proceed.  Interactive audio and video telecommunications were attempted between this nurse and patient, however failed, due to patient having technical difficulties OR patient did not have access to video capability.  We continued and completed visit with audio only.  Some vital signs may be absent or patient reported.   Karen David, LPN  Subjective:   Karen Stephenson is a 66 y.o. female who presents for Medicare Annual (Subsequent) preventive examination.  Review of Systems           Objective:    There were no vitals filed for this visit. There is no height or weight on file to calculate BMI.     08/30/2021    8:56 AM 08/02/2020    6:00 AM 07/26/2020    9:38 AM 07/17/2017    7:04 AM 12/15/2015    2:02 PM  Advanced Directives  Does Patient Have a Medical Advance Directive? No No No No No  Would patient like information on creating a medical advance directive? No - Patient declined No - Patient declined No - Patient declined No - Patient declined     Current Medications (verified) Outpatient Encounter Medications as of 10/04/2021  Medication Sig   acarbose (PRECOSE) 25 MG tablet TAKE 1 TABLET BY MOUTH THREE TIMES DAILY WITH MEALS   albuterol (VENTOLIN HFA) 108 (90 Base) MCG/ACT inhaler Inhale 2 puffs into the lungs every 6 (six) hours as needed for wheezing or shortness of breath.   aspirin EC 81 MG tablet Take 81 mg by mouth daily. Swallow whole.    BIOTIN PO Take 1 tablet by mouth daily.   cetirizine (ZYRTEC) 10 MG tablet Take 10 mg by mouth daily.   chlorpheniramine-HYDROcodone (TUSSIONEX PENNKINETIC ER) 10-8 MG/5ML SUER Take 5 mLs by mouth every 12 (twelve) hours as needed for cough. (Patient not taking: Reported on 08/23/2021)   clopidogrel (PLAVIX) 75 MG tablet Take 1 tablet (75 mg total) by mouth daily.   empagliflozin (JARDIANCE) 10 MG TABS tablet Take 1 tablet (10 mg total) by mouth daily.   fluticasone (FLONASE) 50 MCG/ACT nasal spray Use 2 spray(s) in each nostril once daily (Patient taking differently: Place 1 spray into both nostrils daily.)   Fluticasone-Salmeterol (ADVAIR) 250-50 MCG/DOSE AEPB INHALE ONE DOSE BY MOUTH AS NEEDED (Patient taking differently: Inhale 1 puff into the lungs daily as needed (asthma).)   glucose blood test strip Check sugar once daily, E 11.9   Homeopathic Products (LEG CRAMP RELIEF PO) Take 2 capsules by mouth daily.    hydrochlorothiazide (HYDRODIURIL) 25 MG tablet Take 1 tablet by mouth once daily   hyoscyamine (LEVBID) 0.375 MG 12 hr tablet Take 1 tablet (0.375 mg total) by mouth as needed. (Patient taking differently: Take 0.375 mg by mouth every 12 (twelve) hours as needed for cramping.)   JANUVIA 100 MG tablet Take 1 tablet by mouth once daily   Lancets (ONETOUCH ULTRASOFT) lancets Check sugar once daily ,DX E11.9   losartan (COZAAR) 100 MG tablet Take 1 tablet by mouth  once daily   montelukast (SINGULAIR) 10 MG tablet TAKE 1 TABLET BY MOUTH AT BEDTIME   Multiple Vitamins-Minerals (MULTIVITAMIN WITH MINERALS) tablet Take 1 tablet by mouth daily.   pantoprazole (PROTONIX) 40 MG tablet Take 1 tablet by mouth once daily   rosuvastatin (CRESTOR) 20 MG tablet Take 1 tablet by mouth once daily   vitamin B-12 (CYANOCOBALAMIN) 1000 MCG tablet Take 1,000 mcg by mouth daily.   Vitamin D, Cholecalciferol, 50 MCG (2000 UT) CAPS Take 2,000 Units by mouth daily.   zinc gluconate 50 MG tablet Take 50 mg by  mouth daily.   No facility-administered encounter medications on file as of 10/04/2021.    Allergies (verified) Amoxicillin-pot clavulanate, Aspirin, Ducodyl [bisacodyl], Erythromycin, Glipizide, Metformin hcl, Latex, and Macrobid [nitrofurantoin]   History: Past Medical History:  Diagnosis Date   Anginal pain ([place])    a. 07/2020 MV: No ischemia/infarct-->low risk.   Asthma    Diabetes mellitus without complication ([place])    Family history of adverse reaction to anesthesia    SISTER HAS ISSUE DURING COLONOSCOPY   GERD (gastroesophageal reflux disease)    History of echocardiogram    a. 07/2020 Echo: EF 60-65%, no rwma, nl RV fxn, mild Ao sclerosis w/o stenosis.   Hypercholesteremia    Hypertension    Hypokalemia    PAD (peripheral artery disease) ([place])    a. 08/2021 LE Angio/PTA: Heavily calcified short occlusion of the R CIA (s/p balloon expandable covered stent), mild nonobstructive infrainguinal disease.  Mild left SFA disease.  Borderline significant L popliteal disease with three-vessel runoff below the knee.   Sleep apnea    WAS NOT GIVEN CPAP   Past Surgical History:  Procedure Laterality Date   ABDOMINAL AORTOGRAM W/LOWER EXTREMITY N/A 08/30/2021   Procedure: ABDOMINAL AORTOGRAM W/LOWER EXTREMITY;  Surgeon: Wellington Hampshire, MD;  Location: [place] [facility];  Service: Cardiovascular;  Laterality: N/A;   BACK SURGERY  1986   COLONOSCOPY WITH PROPOFOL N/A 07/17/2017   Procedure: COLONOSCOPY WITH PROPOFOL;  Surgeon: Manya Silvas, MD;  Location: [facility];  Service: Endoscopy;  Laterality: N/A;   NASAL SINUS SURGERY     PERIPHERAL VASCULAR INTERVENTION Right 08/30/2021   Procedure: PERIPHERAL VASCULAR INTERVENTION;  Surgeon: Wellington Hampshire, MD;  Location: [place] [facility];  Service: Cardiovascular;  Laterality: Right;  common Iliac   TOTAL LAPAROSCOPIC HYSTERECTOMY WITH BILATERAL SALPINGO OOPHORECTOMY Bilateral 08/02/2020   Procedure: TOTAL LAPAROSCOPIC HYSTERECTOMY  WITH BILATERAL SALPINGO OOPHORECTOMY;  Surgeon: Gae Dry, MD;  Location: [facility];  Service: Gynecology;  Laterality: Bilateral;  RN TO ASSIST   UTERINE FIBROID SURGERY     Family History  Problem Relation Age of Onset   Diabetes Mother    Breast cancer Mother 59   Heart disease Father    Breast cancer Sister 64       dx twice    Heart disease Paternal Grandfather    Hypertension Brother    Colon cancer Neg Hx    Ovarian cancer Neg Hx    Social History   Socioeconomic History   Marital status: Married    Spouse name: Not on file   Number of children: Not on file   Years of education: Not on file   Highest education level: Not on file  Occupational History   Not on file  Tobacco Use   Smoking status: Former    Types: Cigarettes    Quit date: 07/01/2013    Years since quitting: 8.2  Smokeless tobacco: Never  Vaping Use   Vaping Use: Never used  Substance and Sexual Activity   Alcohol use: No   Drug use: No   Sexual activity: Not on file  Other Topics Concern   Not on file  Social History Narrative   Not on file   Social Determinants of Health   Financial Resource Strain: Not on file  Food Insecurity: Not on file  Transportation Needs: Not on file  Physical Activity: Not on file  Stress: Not on file  Social Connections: Not on file    Tobacco Counseling Counseling given: Not Answered   Clinical Intake:  Pre-visit preparation completed: Yes  Pain : No/denies pain     Nutritional Risks: None Diabetes: Yes CBG done?: No Did pt. bring in CBG monitor from home?: No  How often do you need to have someone help you when you read instructions, pamphlets, or other written materials from your doctor or pharmacy?: 1 - Never  Diabetic?yes Nutrition Risk Assessment:  Has the patient had any N/V/D within the last 2 months?  No  Does the patient have any non-healing wounds?  No  Has the patient had any unintentional weight loss or weight gain?  No    Diabetes:  Is the patient diabetic?  Yes  If diabetic, was a CBG obtained today?  No  Did the patient bring in their glucometer from home?  No  How often do you monitor your CBG's? occasionally.   Financial Strains and Diabetes Management:  Are you having any financial strains with the device, your supplies or your medication? No .  Does the patient want to be seen by Chronic Care Management for management of their diabetes?  No  Would the patient like to be referred to a Nutritionist or for Diabetic Management?  No   Diabetic Exams:  Diabetic Eye Exam: Completed 08/05/19. Overdue for diabetic eye exam. Pt has been advised about the importance in completing this exam.  Has appointment coming up Diabetic Foot Exam: Completed 02/15/21. Pt has been advised about the importance in completing this exam.   Interpreter Needed?: No  Information entered by :: Kirke Shaggy, LPN   Activities of Daily Living    10/03/2021   11:40 AM 02/15/2021   11:15 AM  In your present state of health, do you have any difficulty performing the following activities:  Hearing? 0 0  Vision? 0 0  Difficulty concentrating or making decisions? 0 0  Walking or climbing stairs? 0 0  Dressing or bathing? 0 0  Doing errands, shopping? 0 0  Preparing Food and eating ? N   Using the Toilet? N   In the past six months, have you accidently leaked urine? N   Do you have problems with loss of bowel control? N   Managing your Medications? N   Managing your Finances? N   Housekeeping or managing your Housekeeping? N     Patient Care Team: Jerrol Banana., MD as PCP - General (Family Medicine) Kate Sable, MD as PCP - Cardiology (Cardiology)  Indicate any recent Medical Services you may have received from other than [facility] providers in the past year (date may be approximate).     Assessment:   This is a routine wellness examination for Chayla.  Hearing/Vision screen No results found.  Dietary  issues and exercise activities discussed:     Goals Addressed   None    Depression Screen    02/15/2021   11:15 AM  04/06/2020    3:18 PM 04/01/2019   10:53 AM 03/26/2018   10:53 AM 03/26/2018   10:52 AM 11/20/2017    1:53 PM 11/14/2016    9:54 AM  PHQ 2/9 Scores  PHQ - 2 Score 0 0 0 0 0 0 0  PHQ- 9 Score 0 0   0  0    Fall Risk    10/03/2021   11:40 AM 02/15/2021   11:15 AM 04/06/2020    3:19 PM 04/01/2019   10:52 AM 03/26/2018   10:53 AM  Fall Risk   Falls in the past year? 0 0 0 0 No  Number falls in past yr: 0 0 0 0   Injury with Fall? 0 0 0 0   Risk for fall due to :  No Fall Risks No Fall Risks    Follow up  Falls evaluation completed Falls evaluation completed Falls evaluation completed     [place]:  Any stairs in or around the home? Yes  If so, are there any without handrails? No  Home free of loose throw rugs in walkways, pet beds, electrical cords, etc? Yes  Adequate lighting in your home to reduce risk of falls? Yes   ASSISTIVE DEVICES UTILIZED TO PREVENT FALLS:  Life alert? No  Use of a cane, walker or w/c? No  Grab bars in the bathroom? No  Shower chair or bench in shower? No  Elevated toilet seat or a handicapped toilet? No    Cognitive Function:       04/06/2020   10:40 AM  6CIT Screen  What Year? 0 points  What month? 0 points  What time? 0 points  Count back from 20 0 points  Months in reverse 0 points  Repeat phrase 2 points  Total Score 2 points    Immunizations Immunization History  Administered Date(s) Administered   Fluad Quad(high Dose 65+) 04/06/2020   Influenza Whole 04/23/2017   Influenza,inj,Quad PF,6+ Mos 03/26/2018, 04/01/2019   Moderna Sars-Covid-2 Vaccination 06/30/2020   PFIZER(Purple Top)SARS-COV-2 Vaccination 10/31/2019, 11/24/2019   Pneumococcal Polysaccharide-23 02/29/2012, 04/06/2020   Td 08/17/2003   Tdap 04/18/2011    TDAP status: Due, Education has been provided regarding the  importance of this vaccine. Advised may receive this vaccine at local pharmacy or Health Dept. Aware to provide a copy of the vaccination record if obtained from local pharmacy or Health Dept. Verbalized acceptance and understanding.  Flu Vaccine status: Declined, Education has been provided regarding the importance of this vaccine but patient still declined. Advised may receive this vaccine at local pharmacy or Health Dept. Aware to provide a copy of the vaccination record if obtained from local pharmacy or Health Dept. Verbalized acceptance and understanding.  Pneumococcal vaccine status: Up to date  Covid-19 vaccine status: Completed vaccines  Qualifies for Shingles Vaccine? Yes   Zostavax completed No   Shingrix Completed?: No.    Education has been provided regarding the importance of this vaccine. Patient has been advised to call insurance company to determine out of pocket expense if they have not yet received this vaccine. Advised may also receive vaccine at local pharmacy or Health Dept. Verbalized acceptance and understanding.  Screening Tests Health Maintenance  Topic Date Due   Zoster Vaccines- Shingrix (1 of 2) Never done   DEXA SCAN  Never done   OPHTHALMOLOGY EXAM  08/04/2020   COVID-19 Vaccine (4 - Booster) 08/25/2020   Pneumonia Vaccine 27+ Years old (2 -  PCV) 04/06/2021   TETANUS/TDAP  04/17/2021   HEMOGLOBIN A1C  08/18/2021   INFLUENZA VACCINE  01/30/2022   FOOT EXAM  02/15/2022   MAMMOGRAM  01/19/2023   COLONOSCOPY (Pts 45-77yr Insurance coverage will need to be confirmed)  07/18/2027   Hepatitis C Screening  Completed   HPV VACCINES  Aged Out    Health Maintenance  Health Maintenance Due  Topic Date Due   Zoster Vaccines- Shingrix (1 of 2) Never done   DEXA SCAN  Never done   OPHTHALMOLOGY EXAM  08/04/2020   COVID-19 Vaccine (4 - Booster) 08/25/2020   Pneumonia Vaccine 68 Years old (2 - PCV) 04/06/2021   TETANUS/TDAP  04/17/2021   HEMOGLOBIN A1C   08/18/2021    Colorectal cancer screening: Type of screening: Colonoscopy. Completed 07/17/17. Repeat every 10 years  Mammogram status: Completed 01/18/21. Repeat every year  Bone Density status: Ordered today. Pt provided with contact info and advised to call to schedule appt.  Lung Cancer Screening: (Low Dose CT Chest recommended if Age 68-80years, 30 pack-year currently smoking OR have quit w/in 15years.) does not qualify.   Additional Screening:  Hepatitis C Screening: does qualify; Completed 01/19/13  Vision Screening: Recommended annual ophthalmology exams for early detection of glaucoma and other disorders of the eye. Is the patient up to date with their annual eye exam?  Yes  Who is the provider or what is the name of the office in which the patient attends annual eye exams? Dr.Bell If pt is not established with a provider, would they like to be referred to a provider to establish care? No .   Dental Screening: Recommended annual dental exams for proper oral hygiene  Community Resource Referral / Chronic Care Management: CRR required this visit?  No   CCM required this visit?  No      Plan:     I have personally reviewed and noted the following in the patient's chart:   Medical and social history Use of alcohol, tobacco or illicit drugs  Current medications and supplements including opioid prescriptions.  Functional ability and status Nutritional status Physical activity Advanced directives List of other physicians Hospitalizations, surgeries, and ER visits in previous 12 months Vitals Screenings to include cognitive, depression, and falls Referrals and appointments  In addition, I have reviewed and discussed with patient certain preventive protocols, quality metrics, and best practice recommendations. A written personalized care plan for preventive services as well as general preventive health recommendations were provided to patient.     LDionisio David  LPN   47/11/1950  Nurse Notes: none

## 2021-10-18 ENCOUNTER — Encounter: Payer: Self-pay | Admitting: Podiatry

## 2021-10-18 ENCOUNTER — Ambulatory Visit: Payer: Medicare PPO | Admitting: Podiatry

## 2021-10-18 DIAGNOSIS — D2371 Other benign neoplasm of skin of right lower limb, including hip: Secondary | ICD-10-CM

## 2021-10-18 DIAGNOSIS — B351 Tinea unguium: Secondary | ICD-10-CM

## 2021-10-18 DIAGNOSIS — D2372 Other benign neoplasm of skin of left lower limb, including hip: Secondary | ICD-10-CM

## 2021-10-18 DIAGNOSIS — M79676 Pain in unspecified toe(s): Secondary | ICD-10-CM

## 2021-10-18 DIAGNOSIS — E1142 Type 2 diabetes mellitus with diabetic polyneuropathy: Secondary | ICD-10-CM | POA: Diagnosis not present

## 2021-10-18 NOTE — Progress Notes (Signed)
Presents today for follow-up of her toenails and her ulceration.  She states that things seem to be doing better since she had her vascular procedure performed. ? ?Objective: Vital signs are stable she is alert and oriented x3 there is no erythema edema cellulitis drainage or odor.  I reviewed all reactive hyperkeratosis and preulcerative lesions were debrided today no signs of infection.  Nails are long thick yellow dystrophic Lee mycotic and debrided. ? ?Assessment: Diabetes mellitus diabetic peripheral neuropathy peripheral vascular disease pain in limb secondary to onychomycosis and chronic ulceration. ? ?Plan: Debridement of chronic ulcerations.  And I debrided nails 1 through 5 bilateral.  Follow-up with her in a couple of months. ?

## 2021-10-25 ENCOUNTER — Ambulatory Visit: Payer: Self-pay

## 2021-10-25 NOTE — Telephone Encounter (Signed)
?  Chief Complaint: covid symptoms and wants Paxlovid ?Symptoms: scratchy throat, head congestion,dry cough ?Frequency: sx started yesterday ?Pertinent Negatives: Patient denies SOB, wheezing, dehydration ?Disposition: '[]'$ ED /'[]'$ Urgent Care (no appt availability in office) / '[x]'$ Appointment(In office/virtual)/ '[]'$  [place] [facility]/ '[]'$ Home Care/ '[]'$ Refused Recommended Disposition /'[]'$ [facility] [facility]/ '[]'$  Follow-up with PCP ?Additional Notes: In office virtual appt for tomorrow am - ? ? ? ?Reason for Disposition ? [1] HIGH RISK for severe COVID complications (e.g., weak immune system, age > 23 years, obesity with BMI > 25, pregnant, chronic lung disease or other chronic medical condition) AND [2] COVID symptoms (e.g., cough, fever)  (Exceptions: Already seen by PCP and no new or worsening symptoms.) ? ?Answer Assessment - Initial Assessment Questions ?1. COVID-19 DIAGNOSIS: "Who made your COVID-19 diagnosis?" "Was it confirmed by a positive lab test or self-test?" If not diagnosed by a doctor (or NP/PA), ask "Are there lots of cases (community spread) where you live?" Note: See public health department website, if unsure. ?    Home test  ?2. COVID-19 EXPOSURE: "Was there any known exposure to COVID before the symptoms began?" CDC Definition of close contact: within 6 feet (2 meters) for a total of 15 minutes or more over a 24-hour period.  ?    husband ?3. ONSET: "When did the COVID-19 symptoms start?"  ?    yesterday ?4. WORST SYMPTOM: "What is your worst symptom?" (e.g., cough, fever, shortness of breath, muscle aches) ?   Nasal congestion, throat scratchy, chest heaviness, (denies radiating pain)  ?5. COUGH: "Do you have a cough?" If Yes, ask: "How bad is the cough?"   ?   Sporatic cough ?6. FEVER: "Do you have a fever?" If Yes, ask: "What is your temperature, how was it measured, and when did it start?" ?    no ?7. RESPIRATORY STATUS: "Describe your breathing?" (e.g., shortness of breath, wheezing, unable  to speak)  ?    no ?8. BETTER-SAME-WORSE: "Are you getting better, staying the same or getting worse compared to yesterday?"  If getting worse, ask, "In what way?" ?    worse ?9. HIGH RISK DISEASE: "Do you have any chronic medical problems?" (e.g., asthma, heart or lung disease, weak immune system, obesity, etc.) ?    Diabetes, asthma ?10. VACCINE: "Have you had the COVID-19 vaccine?" If Yes, ask: "Which one, how many shots, when did you get it?" ?      yes ?11. BOOSTER: "Have you received your COVID-19 booster?" If Yes, ask: "Which one and when did you get it?" ?      yes ?12. PREGNANCY: "Is there any chance you are pregnant?" "When was your last menstrual period?" ?      *No Answer* ?13. OTHER SYMPTOMS: "Do you have any other symptoms?"  (e.g., chills, fatigue, headache, loss of smell or taste, muscle pain, sore throat) ?      *No Answer* ?14. O2 SATURATION MONITOR:  "Do you use an oxygen saturation monitor (pulse oximeter) at home?" If Yes, ask "What is your reading (oxygen level) today?" "What is your usual oxygen saturation reading?" (e.g., 95%) ?      *No Answer* ? ?Protocols used: Coronavirus (XVQMG-86) Diagnosed or Suspected-A-AH ? ?

## 2021-10-25 NOTE — Progress Notes (Signed)
? ? ?I,Karen Stephenson,acting as a Education administrator for [employer], PA-C.,have documented all relevant documentation on the behalf of Karen Kirschner, PA-C,as directed by  Karen Kirschner, PA-C while in the presence of Karen Kirschner, PA-C. ? ?MyChart Video Visit ? ? ? ?Virtual Visit via Video Note  ? ?This visit type was conducted due to national recommendations for restrictions regarding the COVID-19 Pandemic (e.g. social distancing) in an effort to limit this patient's exposure and mitigate transmission in our community. This patient is at least at moderate risk for complications without adequate follow up. This format is felt to be most appropriate for this patient at this time. Physical exam was limited by quality of the video and audio technology used for the visit.  ? ?Patient location: home ?Provider location: [facility] ? ?I discussed the limitations of evaluation and management by telemedicine and the availability of in person appointments. The patient expressed understanding and agreed to proceed. ? ?Patient: Karen Stephenson   DOB: 02-Jul-1956   66 y.o. Female  MRN: [id number] ?Visit Date: 10/26/2021 ? ?Today's healthcare provider: Mikey Kirschner, PA-C  ? ?Cc. Covid diagnosis ? ?Subjective  ?  ?HPI  ?Karen Stephenson is a 66 y/o female who presents today after a diagnosis of COVID yesterday on a home test. She reports a burning throat, head congestion, cough, tightness in chest since yesterday. Reports using inhaler when chest tightness occurs. Denies SOB, fevers, wheezing. Taking tylenol OTC.  ? ? ? ? ?Medications: ?Outpatient Medications Prior to Visit  ?Medication Sig  ? acarbose (PRECOSE) 25 MG tablet TAKE 1 TABLET BY MOUTH THREE TIMES DAILY WITH MEALS  ? albuterol (VENTOLIN HFA) 108 (90 Base) MCG/ACT inhaler Inhale 2 puffs into the lungs every 6 (six) hours as needed for wheezing or shortness of breath.  ? aspirin EC 81 MG tablet Take 81 mg by mouth daily. Swallow whole.  ? BIOTIN PO Take 1 tablet by  mouth daily. (Patient not taking: Reported on 10/04/2021)  ? cetirizine (ZYRTEC) 10 MG tablet Take 10 mg by mouth daily.  ? chlorpheniramine-HYDROcodone (TUSSIONEX PENNKINETIC ER) 10-8 MG/5ML SUER Take 5 mLs by mouth every 12 (twelve) hours as needed for cough. (Patient not taking: Reported on 08/23/2021)  ? clopidogrel (PLAVIX) 75 MG tablet Take 1 tablet (75 mg total) by mouth daily.  ? empagliflozin (JARDIANCE) 10 MG TABS tablet Take 1 tablet (10 mg total) by mouth daily.  ? fluticasone (FLONASE) 50 MCG/ACT nasal spray Use 2 spray(s) in each nostril once daily (Patient taking differently: Place 1 spray into both nostrils daily.)  ? Fluticasone-Salmeterol (ADVAIR) 250-50 MCG/DOSE AEPB INHALE ONE DOSE BY MOUTH AS NEEDED (Patient taking differently: Inhale 1 puff into the lungs daily as needed (asthma).)  ? glucose blood test strip Check sugar once daily, E 11.9  ? Homeopathic Products (LEG CRAMP RELIEF PO) Take 2 capsules by mouth daily.   ? hydrochlorothiazide (HYDRODIURIL) 25 MG tablet Take 1 tablet by mouth once daily  ? hyoscyamine (LEVBID) 0.375 MG 12 hr tablet Take 1 tablet (0.375 mg total) by mouth as needed. (Patient taking differently: Take 0.375 mg by mouth every 12 (twelve) hours as needed for cramping.)  ? JANUVIA 100 MG tablet Take 1 tablet by mouth once daily  ? Lancets (ONETOUCH ULTRASOFT) lancets Check sugar once daily ,DX E11.9  ? losartan (COZAAR) 100 MG tablet Take 1 tablet by mouth once daily  ? montelukast (SINGULAIR) 10 MG tablet TAKE 1 TABLET BY MOUTH AT BEDTIME  ? Multiple Vitamins-Minerals (MULTIVITAMIN WITH  MINERALS) tablet Take 1 tablet by mouth daily.  ? pantoprazole (PROTONIX) 40 MG tablet Take 1 tablet by mouth once daily  ? rosuvastatin (CRESTOR) 20 MG tablet Take 1 tablet by mouth once daily  ? vitamin B-12 (CYANOCOBALAMIN) 1000 MCG tablet Take 1,000 mcg by mouth daily.  ? Vitamin D, Cholecalciferol, 50 MCG (2000 UT) CAPS Take 2,000 Units by mouth daily.  ? zinc gluconate 50 MG tablet  Take 50 mg by mouth daily.  ? ?No facility-administered medications prior to visit.  ? ? ?Review of Systems  ?Constitutional:  Positive for fatigue. Negative for fever.  ?HENT:  Positive for congestion, sinus pressure and sinus pain.   ?Respiratory:  Positive for cough and chest tightness. Negative for shortness of breath.   ?Cardiovascular:  Negative for chest pain and leg swelling.  ?Gastrointestinal:  Negative for abdominal pain.  ?Neurological:  Negative for dizziness and headaches.  ? ? ? ? Objective  ?  ?There were no vitals taken for this visit. ? ? ?Physical Exam ?Neurological:  ?   Mental Status: She is oriented to person, place, and time.  ?Psychiatric:     ?   Mood and Affect: Mood normal.     ?   Behavior: Behavior normal.  ?  ? ? ? Assessment & Plan  ?  ? ?URI d/t COVID 19 ?Candidate for antiviral ?Sent in molnupriavir, secondary to no recent GFR and use of plavix ?Fluids, rest, tylenol if needed ? ?2. Asthma history w/ exacerbation secondary to COVID ?Refilled advair and albuterol, use PRN symptoms ? ?I discussed the assessment and treatment plan with the patient. The patient was provided an opportunity to ask questions and all were answered. The patient agreed with the plan and demonstrated an understanding of the instructions. ?  ?The patient was advised to call back or seek an in-person evaluation if the symptoms worsen or if the condition fails to improve as anticipated. ? ?I provided 10 minutes of non-face-to-face time during this encounter. ? ?I, Karen Kirschner, PA-C have reviewed all documentation for this visit. The documentation on  10/26/2021 for the exam, diagnosis, procedures, and orders are all accurate and complete.  ? ?Karen Kirschner, PA-C ?[facility] ?[id number] ?[place], [place], [address] ?Office: [phone] ?Fax: [phone]  ? ? Medical Group  ? ?

## 2021-10-26 ENCOUNTER — Telehealth: Payer: Self-pay | Admitting: Family Medicine

## 2021-10-26 ENCOUNTER — Other Ambulatory Visit: Payer: Self-pay | Admitting: Family Medicine

## 2021-10-26 ENCOUNTER — Telehealth (INDEPENDENT_AMBULATORY_CARE_PROVIDER_SITE_OTHER): Payer: Medicare PPO | Admitting: Physician Assistant

## 2021-10-26 DIAGNOSIS — U071 COVID-19: Secondary | ICD-10-CM

## 2021-10-26 DIAGNOSIS — J4521 Mild intermittent asthma with (acute) exacerbation: Secondary | ICD-10-CM | POA: Diagnosis not present

## 2021-10-26 DIAGNOSIS — J069 Acute upper respiratory infection, unspecified: Secondary | ICD-10-CM

## 2021-10-26 MED ORDER — MOLNUPIRAVIR EUA 200MG CAPSULE: 4.0000 | ORAL_CAPSULE | Freq: Two times a day (BID) | ORAL | 0 refills | Status: AC

## 2021-10-26 MED ORDER — FLUTICASONE-SALMETEROL 250-50 MCG/ACT IN AEPB: 1.0000 | INHALATION_SPRAY | Freq: Two times a day (BID) | RESPIRATORY_TRACT | 2 refills | Status: DC

## 2021-10-26 MED ORDER — ALBUTEROL SULFATE HFA 108 (90 BASE) MCG/ACT IN AERS: 2.0000 | INHALATION_SPRAY | Freq: Four times a day (QID) | RESPIRATORY_TRACT | 2 refills | Status: AC | PRN

## 2021-10-26 NOTE — Telephone Encounter (Signed)
Medication Refill - Medication: chlorpheniramine-HYDROcodone (TUSSIONEX PENNKINETIC ER) 10-8 MG/5ML SUER ? ?Has the patient contacted their pharmacy? No. ?(Pt had video visit this am. Positive for covid.  She says she is coughing more, and would like this cough medicine called in. ? ?Preferred Pharmacy (with phone number or street name): [place], [place] ? ?Has the patient been seen for an appointment in the last year OR does the patient have an upcoming appointment? Yes.   ? ?Agent: Please be advised that RX refills may take up to 3 business days. We ask that you follow-up with your pharmacy. ? ?

## 2021-10-27 ENCOUNTER — Encounter: Payer: Self-pay | Admitting: Family Medicine

## 2021-10-27 ENCOUNTER — Other Ambulatory Visit: Payer: Self-pay | Admitting: Physician Assistant

## 2021-10-27 DIAGNOSIS — U071 COVID-19: Secondary | ICD-10-CM

## 2021-10-27 MED ORDER — HYDROCOD POLI-CHLORPHE POLI ER 10-8 MG/5ML PO SUER: 5.0000 mL | Freq: Every evening | ORAL | 0 refills | Status: DC | PRN

## 2021-10-28 ENCOUNTER — Encounter: Payer: Self-pay | Admitting: Physician Assistant

## 2021-10-30 ENCOUNTER — Other Ambulatory Visit: Payer: Self-pay | Admitting: Physician Assistant

## 2021-10-30 DIAGNOSIS — U071 COVID-19: Secondary | ICD-10-CM

## 2021-10-30 MED ORDER — HYDROCOD POLI-CHLORPHE POLI ER 10-8 MG/5ML PO SUER: 5.0000 mL | Freq: Two times a day (BID) | ORAL | 0 refills | Status: DC | PRN

## 2021-11-10 ENCOUNTER — Other Ambulatory Visit: Payer: Self-pay | Admitting: Cardiovascular Disease

## 2021-11-10 DIAGNOSIS — I739 Peripheral vascular disease, unspecified: Secondary | ICD-10-CM

## 2021-11-11 ENCOUNTER — Other Ambulatory Visit: Payer: Self-pay | Admitting: Family Medicine

## 2021-11-11 DIAGNOSIS — I10 Essential (primary) hypertension: Secondary | ICD-10-CM

## 2021-11-13 ENCOUNTER — Encounter: Payer: Medicare PPO | Admitting: Family Medicine

## 2021-11-13 NOTE — Telephone Encounter (Signed)
Requested Prescriptions  ?Pending Prescriptions Disp Refills  ?? losartan (COZAAR) 100 MG tablet [Pharmacy Med Name: Losartan Potassium 100 MG Oral Tablet] 90 tablet 0  ?  Sig: Take 1 tablet by mouth once daily  ?  ? Cardiovascular:  Angiotensin Receptor Blockers Passed - 11/11/2021 12:41 AM  ?  ?  Passed - Cr in normal range and within 180 days  ?  Creatinine, Ser  ?Date Value Ref Range Status  ?08/23/2021 0.92 0.57 - 1.00 mg/dL Final  ?   ?  ?  Passed - K in normal range and within 180 days  ?  Potassium  ?Date Value Ref Range Status  ?08/23/2021 3.8 3.5 - 5.2 mmol/L Final  ?   ?  ?  Passed - Patient is not pregnant  ?  ?  Passed - Last BP in normal range  ?  BP Readings from Last 1 Encounters:  ?09/14/21 130/74  ?   ?  ?  Passed - Valid encounter within last 6 months  ?  Recent Outpatient Visits   ?      ? 2 weeks ago Upper respiratory tract infection due to COVID-19 virus  ? [facility] Thedore Mins, Ria Comment, PA-C  ? 9 months ago Type 2 diabetes mellitus with diabetic polyneuropathy, without long-term current use of insulin ([place])  ? [facility] Jerrol Banana., MD  ? 1 year ago Type 2 diabetes mellitus with diabetic polyneuropathy, without long-term current use of insulin ([place])  ? [facility] Jerrol Banana., MD  ? 1 year ago Pre-op examination  ? [facility] Jerrol Banana., MD  ? 1 year ago Cough  ? [facility] Jerrol Banana., MD  ?  ?  ?Future Appointments   ?        ? In 1 week Jerrol Banana., MD [facility]  ?  ? ?  ?  ?  ?? hydrochlorothiazide (HYDRODIURIL) 25 MG tablet [Pharmacy Med Name: hydroCHLOROthiazide 25 MG Oral Tablet] 90 tablet 0  ?  Sig: Take 1 tablet by mouth once daily  ?  ? Cardiovascular: Diuretics - Thiazide Failed - 11/11/2021 12:41 AM  ?  ?  Failed - Na in normal range and within 180 days  ?  Sodium  ?Date Value Ref Range Status  ?08/23/2021 145 (H) 134 - 144 mmol/L  Final  ?   ?  ?  Passed - Cr in normal range and within 180 days  ?  Creatinine, Ser  ?Date Value Ref Range Status  ?08/23/2021 0.92 0.57 - 1.00 mg/dL Final  ?   ?  ?  Passed - K in normal range and within 180 days  ?  Potassium  ?Date Value Ref Range Status  ?08/23/2021 3.8 3.5 - 5.2 mmol/L Final  ?   ?  ?  Passed - Last BP in normal range  ?  BP Readings from Last 1 Encounters:  ?09/14/21 130/74  ?   ?  ?  Passed - Valid encounter within last 6 months  ?  Recent Outpatient Visits   ?      ? 2 weeks ago Upper respiratory tract infection due to COVID-19 virus  ? [facility] Thedore Mins, Ria Comment, PA-C  ? 9 months ago Type 2 diabetes mellitus with diabetic polyneuropathy, without long-term current use of insulin ([place])  ? [facility] Jerrol Banana., MD  ? 1 year ago  Type 2 diabetes mellitus with diabetic polyneuropathy, without long-term current use of insulin ([place])  ? [facility] Jerrol Banana., MD  ? 1 year ago Pre-op examination  ? [facility] Jerrol Banana., MD  ? 1 year ago Cough  ? [facility] Jerrol Banana., MD  ?  ?  ?Future Appointments   ?        ? In 1 week Jerrol Banana., MD [facility]  ?  ? ?  ?  ?  ? ? ?

## 2021-11-22 ENCOUNTER — Other Ambulatory Visit: Payer: Medicare PPO

## 2021-11-22 ENCOUNTER — Encounter: Payer: Self-pay | Admitting: Family Medicine

## 2021-11-22 ENCOUNTER — Ambulatory Visit (INDEPENDENT_AMBULATORY_CARE_PROVIDER_SITE_OTHER): Payer: Medicare PPO | Admitting: Family Medicine

## 2021-11-22 VITALS — BP 126/80 | HR 76 | Resp 16 | Ht 68.0 in | Wt 258.0 lb

## 2021-11-22 DIAGNOSIS — G4733 Obstructive sleep apnea (adult) (pediatric): Secondary | ICD-10-CM | POA: Diagnosis not present

## 2021-11-22 DIAGNOSIS — I739 Peripheral vascular disease, unspecified: Secondary | ICD-10-CM

## 2021-11-22 DIAGNOSIS — Z0000 Encounter for general adult medical examination without abnormal findings: Secondary | ICD-10-CM | POA: Diagnosis not present

## 2021-11-22 DIAGNOSIS — I10 Essential (primary) hypertension: Secondary | ICD-10-CM | POA: Diagnosis not present

## 2021-11-22 DIAGNOSIS — E119 Type 2 diabetes mellitus without complications: Secondary | ICD-10-CM | POA: Diagnosis not present

## 2021-11-22 DIAGNOSIS — E7800 Pure hypercholesterolemia, unspecified: Secondary | ICD-10-CM | POA: Diagnosis not present

## 2021-11-22 DIAGNOSIS — E668 Other obesity: Secondary | ICD-10-CM

## 2021-11-22 DIAGNOSIS — Z23 Encounter for immunization: Secondary | ICD-10-CM | POA: Diagnosis not present

## 2021-11-22 DIAGNOSIS — E1142 Type 2 diabetes mellitus with diabetic polyneuropathy: Secondary | ICD-10-CM | POA: Diagnosis not present

## 2021-11-22 NOTE — Progress Notes (Signed)
Annual Wellness Visit    I,[name],acting as a scribe for [name], MD.,have documented all relevant documentation on the behalf of [name], MD,as directed by  [name], MD while in the presence of [name], MD.   Patient: Karen Stephenson, Female    DOB: [birth year], 66 y.o.   MRN: [id number] Visit Date: 11/22/2021  Today's Provider: [name], MD   Chief Complaint  Patient presents with   Medicare Wellness   Subjective    Karen Stephenson is a 66 y.o. female who presents today for her Annual Wellness Visit.Annual Physical. She reports consuming a general diet. The patient does not participate in regular exercise at present. She generally feels well. She reports sleeping well. She does not have additional problems to discuss today.   HPI She had hysterectomy in 2021 for uterine prolapse by Dr. Kenton Kingfisher. On March 1 patient had lower extremities stenting for PAD and that was successful.  She has had no pain since then.  Medications: Outpatient Medications Prior to Visit  Medication Sig   acarbose (PRECOSE) 25 MG tablet TAKE 1 TABLET BY MOUTH THREE TIMES DAILY WITH MEALS   albuterol (VENTOLIN HFA) 108 (90 Base) MCG/ACT inhaler Inhale 2 puffs into the lungs every 6 (six) hours as needed for wheezing or shortness of breath.   aspirin EC 81 MG tablet Take 81 mg by mouth daily. Swallow whole.   cetirizine (ZYRTEC) 10 MG tablet Take 10 mg by mouth daily.   chlorpheniramine-HYDROcodone (TUSSIONEX PENNKINETIC ER) 10-8 MG/5ML Take 5 mLs by mouth every 12 (twelve) hours as needed for cough.   clopidogrel (PLAVIX) 75 MG tablet Take 1 tablet (75 mg total) by mouth daily.   fluticasone (FLONASE) 50 MCG/ACT nasal spray Use 2 spray(s) in each nostril once daily (Patient taking differently: Place 1 spray into both nostrils daily.)   fluticasone-salmeterol (ADVAIR DISKUS) 250-50 MCG/ACT AEPB Inhale 1 puff into the lungs in the morning and at  bedtime.   glucose blood test strip Check sugar once daily, E 11.9   Homeopathic Products (LEG CRAMP RELIEF PO) Take 2 capsules by mouth daily.    hydrochlorothiazide (HYDRODIURIL) 25 MG tablet Take 1 tablet by mouth once daily   hyoscyamine (LEVBID) 0.375 MG 12 hr tablet Take 1 tablet (0.375 mg total) by mouth as needed. (Patient taking differently: Take 0.375 mg by mouth every 12 (twelve) hours as needed for cramping.)   JANUVIA 100 MG tablet Take 1 tablet by mouth once daily   JARDIANCE 10 MG TABS tablet Take 1 tablet by mouth once daily   Lancets (ONETOUCH ULTRASOFT) lancets Check sugar once daily ,DX E11.9   losartan (COZAAR) 100 MG tablet Take 1 tablet by mouth once daily   montelukast (SINGULAIR) 10 MG tablet TAKE 1 TABLET BY MOUTH AT BEDTIME   Multiple Vitamins-Minerals (MULTIVITAMIN WITH MINERALS) tablet Take 1 tablet by mouth daily.   pantoprazole (PROTONIX) 40 MG tablet Take 1 tablet by mouth once daily   rosuvastatin (CRESTOR) 20 MG tablet Take 1 tablet by mouth once daily   vitamin B-12 (CYANOCOBALAMIN) 1000 MCG tablet Take 1,000 mcg by mouth daily.   Vitamin D, Cholecalciferol, 50 MCG (2000 UT) CAPS Take 2,000 Units by mouth daily.   zinc gluconate 50 MG tablet Take 50 mg by mouth daily.   [DISCONTINUED] BIOTIN PO Take 1 tablet by mouth daily. (Patient not taking: Reported on 10/04/2021)   No facility-administered medications prior to visit.  Allergies  Allergen Reactions   Amoxicillin-Pot Clavulanate Diarrhea   Aspirin     Higher doses - passed out    Ducodyl [Bisacodyl]     Passed out, extreme cramping    Erythromycin     Stomach Ache Stomach pain.   Glipizide     Extreme stomach aches    Metformin Hcl     Extreme stomach cramps    Latex Rash   Macrobid [Nitrofurantoin]     Severe diarrhea, severe abdominal pain/cramping    Patient Care Team: Jerrol Banana., MD as PCP - General (Family Medicine) Kate Sable, MD as PCP - Cardiology  (Cardiology)  Review of Systems  Constitutional:  Positive for diaphoresis.  All other systems reviewed and are negative.  Last lipids Lab Results  Component Value Date   CHOL 110 06/08/2020   HDL 32 (L) 06/08/2020   LDLCALC 57 06/08/2020   TRIG 112 06/08/2020   CHOLHDL 3.4 06/08/2020        Objective    Vitals: BP 126/80 (BP Location: Right Arm, Patient Position: Sitting, Cuff Size: Large)   Pulse 76   Resp 16   Ht '5\' 8"'$  (1.727 m)   Wt 258 lb (117 kg)   SpO2 96%   BMI 39.23 kg/m  BP Readings from Last 3 Encounters:  11/22/21 126/80  09/14/21 130/74  08/30/21 (!) 141/73   Wt Readings from Last 3 Encounters:  11/22/21 258 lb (117 kg)  10/04/21 261 lb (118.4 kg)  09/14/21 261 lb 2 oz (118.4 kg)       Physical Exam Vitals and nursing note reviewed.  Constitutional:      Appearance: She is well-developed. She is obese.  HENT:     Head: Normocephalic and atraumatic.     Right Ear: External ear normal.     Left Ear: External ear normal.  Eyes:     General: No scleral icterus.    Conjunctiva/sclera: Conjunctivae normal.  Neck:     Thyroid: No thyromegaly.     Vascular: No carotid bruit.  Cardiovascular:     Rate and Rhythm: Normal rate and regular rhythm.     Pulses: Normal pulses.     Heart sounds: Normal heart sounds.  Pulmonary:     Effort: Pulmonary effort is normal.     Breath sounds: Normal breath sounds.  Chest:  Breasts:    Right: No swelling, bleeding, inverted nipple, mass, nipple discharge, skin change or tenderness.     Left: No swelling, bleeding, inverted nipple, mass, nipple discharge, skin change or tenderness.  Abdominal:     Palpations: Abdomen is soft.  Lymphadenopathy:     Cervical: No cervical adenopathy.  Skin:    General: Skin is warm and dry.  Neurological:     Mental Status: She is alert and oriented to person, place, and time.  Psychiatric:        Behavior: Behavior normal.        Thought Content: Thought content normal.         Judgment: Judgment normal.     Most recent functional status assessment:    11/22/2021    9:58 AM  In your present state of health, do you have any difficulty performing the following activities:  Hearing? 0  Vision? 1  Difficulty concentrating or making decisions? 0  Walking or climbing stairs? 0  Dressing or bathing? 0  Doing errands, shopping? 0   Most recent fall risk assessment:    11/22/2021  9:58 AM  Fall Risk   Falls in the past year? 0  Number falls in past yr: 0  Injury with Fall? 0  Risk for fall due to : No Fall Risks  Follow up Falls evaluation completed    Most recent depression screenings:    11/22/2021    9:58 AM 10/04/2021   11:03 AM  PHQ 2/9 Scores  PHQ - 2 Score 0 0  PHQ- 9 Score 0    Most recent cognitive screening:    04/06/2020   10:40 AM  6CIT Screen  What Year? 0 points  What month? 0 points  What time? 0 points  Count back from 20 0 points  Months in reverse 0 points  Repeat phrase 2 points  Total Score 2 points   Most recent Audit-C alcohol use screening    11/22/2021    9:57 AM  Alcohol Use Disorder Test (AUDIT)  1. How often do you have a drink containing alcohol? 1  2. How many drinks containing alcohol do you have on a typical day when you are drinking? 0  3. How often do you have six or more drinks on one occasion? 0  AUDIT-C Score 1   A score of 3 or more in women, and 4 or more in men indicates increased risk for alcohol abuse, EXCEPT if all of the points are from question 1   No results found for any visits on 11/22/21.  Assessment & Plan     Annual wellness visit done today including the all of the following: Reviewed patient's Family Medical History Reviewed and updated list of patient's medical providers Assessment of cognitive impairment was done Assessed patient's functional ability Established a written schedule for health screening [place] Completed and Reviewed  Exercise  Activities and Dietary recommendations  Goals      DIET - EAT MORE FRUITS AND VEGETABLES     Exercise 150 minutes per week (moderate activity)        Immunization History  Administered Date(s) Administered   Fluad Quad(high Dose 65+) 04/06/2020   Influenza Whole 04/23/2017   Influenza,inj,Quad PF,6+ Mos 03/26/2018, 04/01/2019   Moderna Sars-Covid-2 Vaccination 06/30/2020   PFIZER(Purple Top)SARS-COV-2 Vaccination 10/31/2019, 11/24/2019   PNEUMOCOCCAL CONJUGATE-20 11/22/2021   Pneumococcal Polysaccharide-23 02/29/2012, 04/06/2020   Td 08/17/2003   Tdap 04/18/2011    Health Maintenance  Topic Date Due   Zoster Vaccines- Shingrix (1 of 2) Never done   DEXA SCAN  Never done   OPHTHALMOLOGY EXAM  08/04/2020   COVID-19 Vaccine (4 - Booster) 08/25/2020   TETANUS/TDAP  04/17/2021   HEMOGLOBIN A1C  08/18/2021   INFLUENZA VACCINE  01/30/2022   FOOT EXAM  02/15/2022   MAMMOGRAM  01/19/2023   COLONOSCOPY (Pts 45-30yr Insurance coverage will need to be confirmed)  07/18/2027   Pneumonia Vaccine 66 Years old  Completed   Hepatitis C Screening  Completed   HPV VACCINES  Aged Out     Discussed health benefits of physical activity, and encouraged her to engage in regular exercise appropriate for her age and condition.   1. Encounter for Medicare annual wellness exam  - Lipid panel - TSH  2. Annual physical exam Up-to-date on screening exams. We will do pelvic exam next year but she does not need a Pap smear  3. Essential (primary) hypertension  - Lipid panel - TSH  4. Hypercholesteremia  - Lipid panel - TSH  5. Type 2 diabetes mellitus with diabetic polyneuropathy, without long-term  current use of insulin (HCC) Goal A1c less than 7 - Lipid panel - TSH  6. Extreme obesity  - Lipid panel - TSH  7. Obstructive apnea  - Lipid panel - TSH  8. Diabetes mellitus with  complication /PAD [facility]) Doing well post stenting of right leg - HgB A1c  9. Need for  pneumococcal vaccine  - Pneumococcal conjugate vaccine 20-valent (Prevnar 20)   Return in about 4 months (around 03/25/2022).     I, [name], MD, have reviewed all documentation for this visit. The documentation on 11/22/21 for the exam, diagnosis, procedures, and orders are all accurate and complete.    Mackenzie Lia [place]ford Mon, MD  [facility] [phone] (phone) [phone] (fax)  [place]

## 2021-11-23 ENCOUNTER — Other Ambulatory Visit: Payer: Self-pay | Admitting: Family Medicine

## 2021-11-23 DIAGNOSIS — J302 Other seasonal allergic rhinitis: Secondary | ICD-10-CM

## 2021-11-23 LAB — LIPID PANEL
Chol/HDL Ratio: 3.3 ratio (ref 0.0–4.4)
Cholesterol, Total: 117 mg/dL (ref 100–199)
HDL: 36 mg/dL — ABNORMAL LOW (ref >39)
LDL Chol Calc (NIH): 58 mg/dL (ref 0–99)
Triglycerides: 127 mg/dL (ref 0–149)
VLDL Cholesterol Cal: 23 mg/dL (ref 5–40)

## 2021-11-23 LAB — HEMOGLOBIN A1C
Est. average glucose Bld gHb Est-mCnc: 177 mg/dL
Hgb A1c MFr Bld: 7.8 % — ABNORMAL HIGH (ref 4.8–5.6)

## 2021-11-23 LAB — TSH: TSH: 2.45 u[IU]/mL (ref 0.450–4.500)

## 2021-11-29 IMAGING — MG MM DIGITAL SCREENING BILAT W/ TOMO AND CAD
8 series · 8 of 24 positions shown · non-contrast
Comparison: Previous exam(s).

ACR Breast Density Category a: The breast tissue is almost entirely
fatty.

CLINICAL DATA: Screening.

EXAM:
DIGITAL SCREENING BILATERAL MAMMOGRAM WITH TOMOSYNTHESIS AND CAD
TECHNIQUE: Bilateral screening digital craniocaudal and mediolateral oblique
mammograms were obtained. Bilateral screening digital breast
tomosynthesis was performed. The images were evaluated with
computer-aided detection.

[R MLO synth-2D]
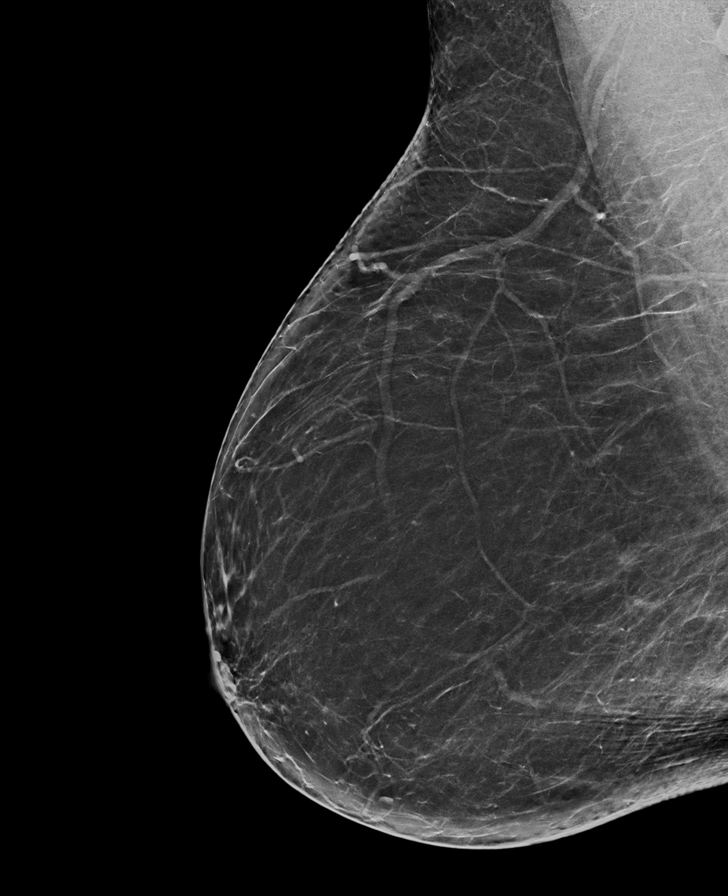

[L MLO synth-2D]
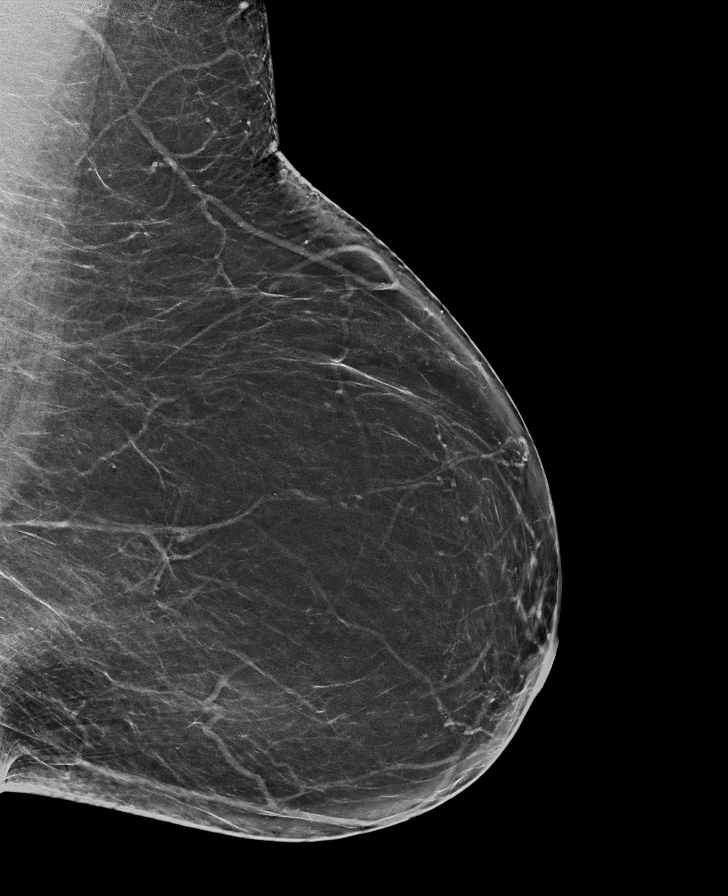

[L CC synth-2D]
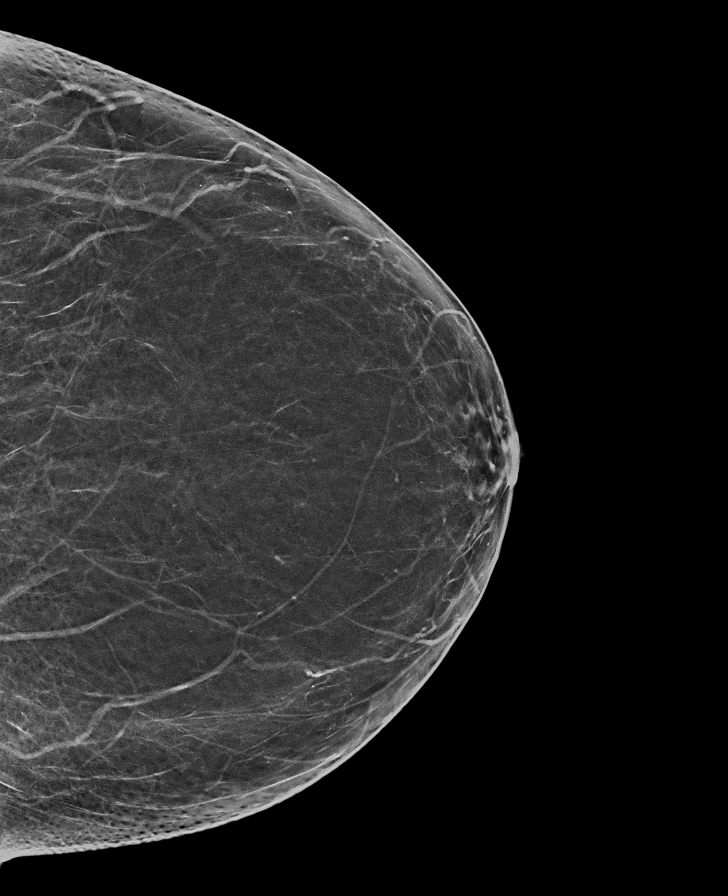

[R CC synth-2D]
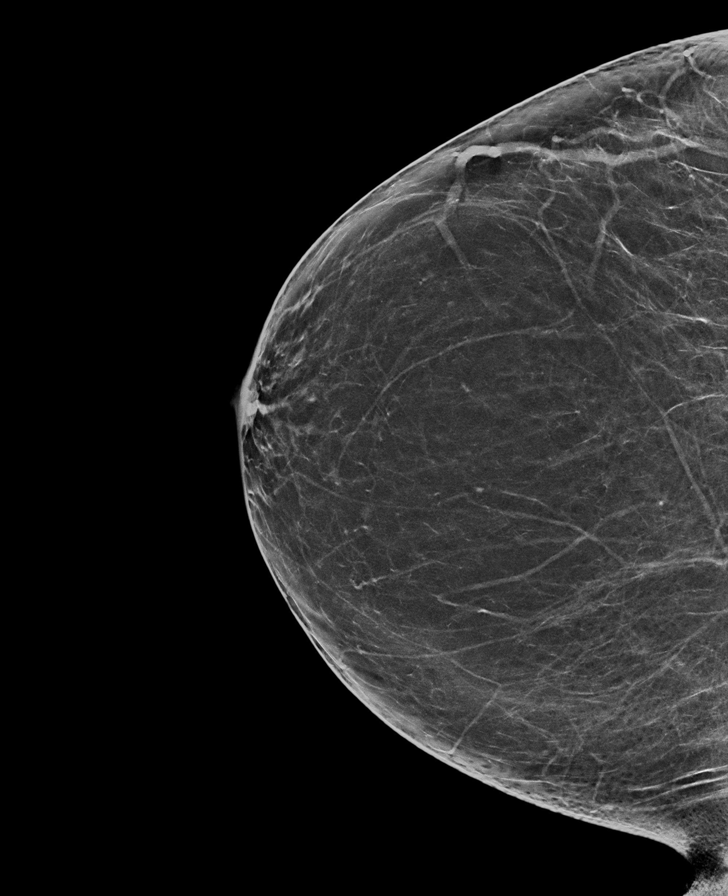

[L CC tomo · tomo slice 35/69.0]
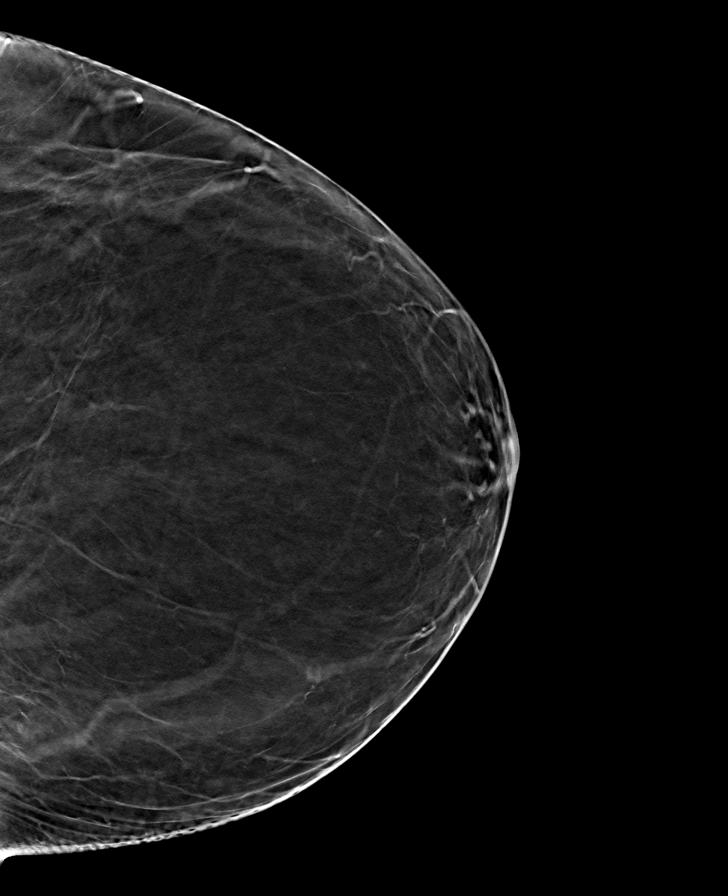

[R CC tomo · tomo slice 35/70.0]
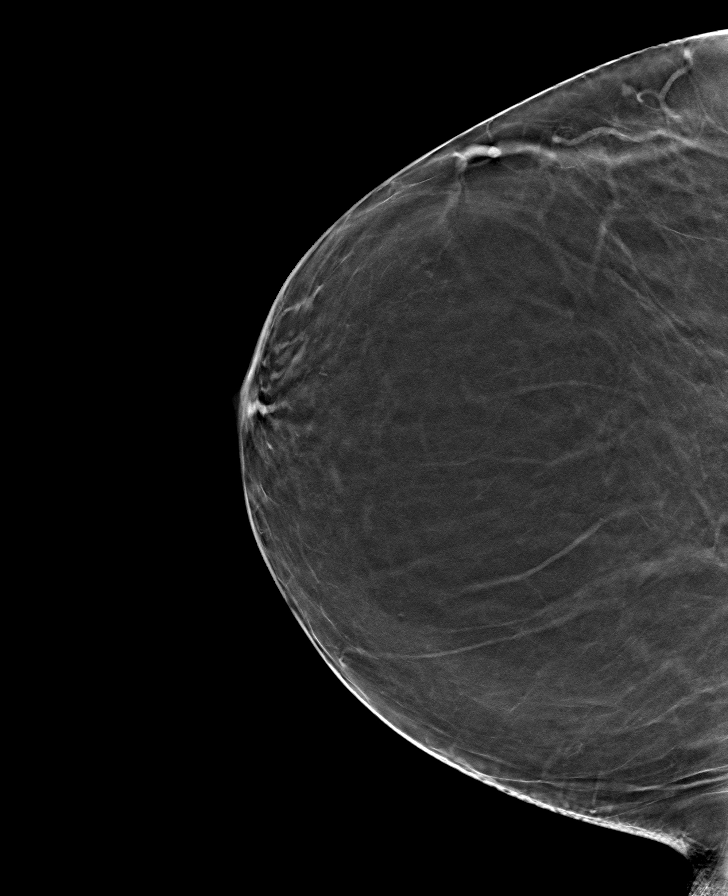

[R MLO tomo · tomo slice 41/80.0]
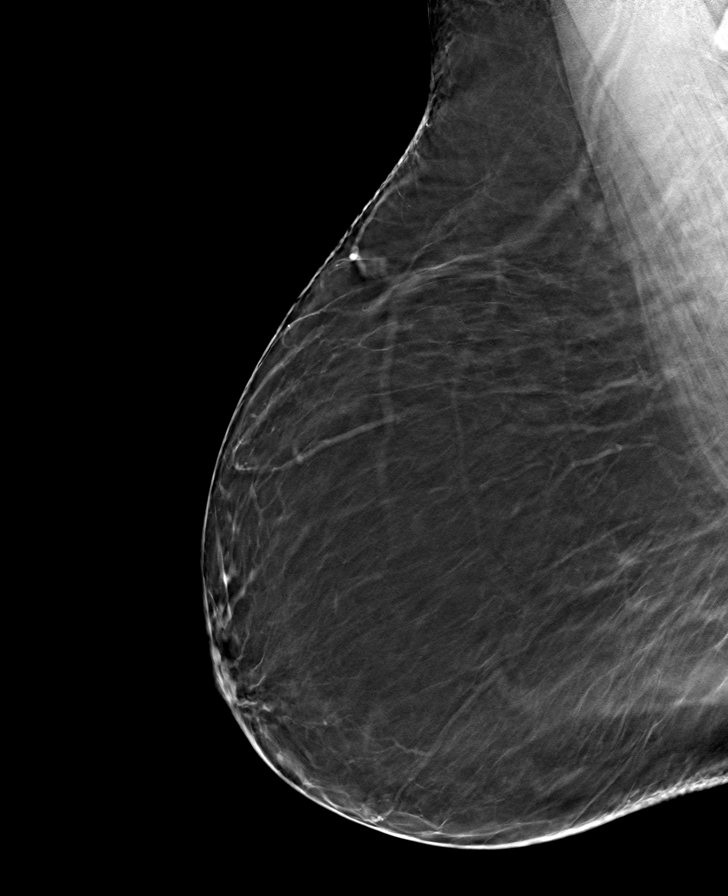

[L MLO tomo · tomo slice 41/81.0]
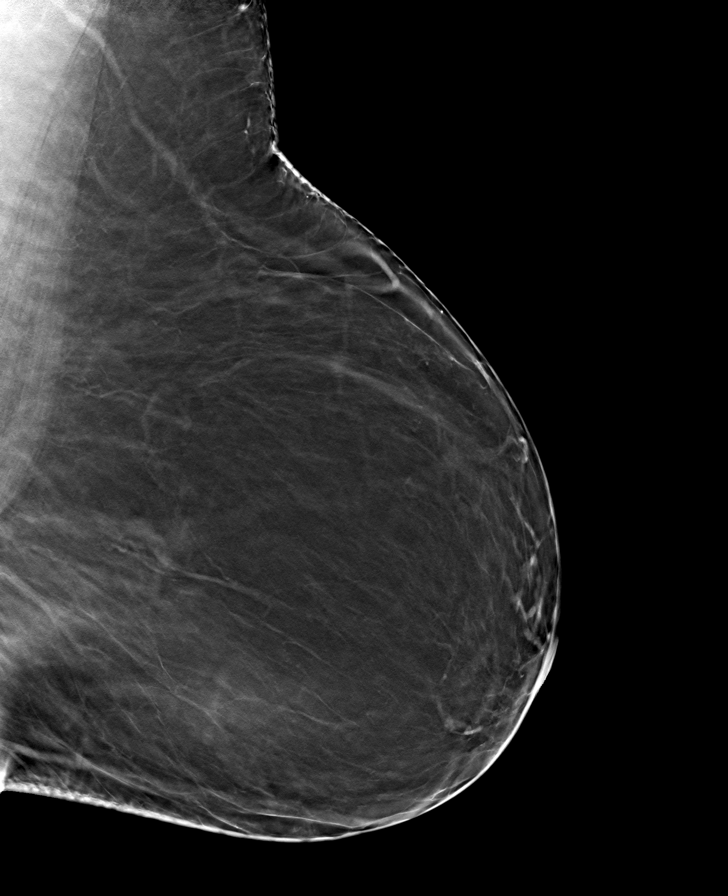

[8 of 24 positions shown; findings below may reference images not displayed]

FINDINGS: There are no findings suspicious for malignancy.
IMPRESSION: No mammographic evidence of malignancy. A result letter of this
screening mammogram will be mailed directly to the patient.

RECOMMENDATION:
Screening mammogram in one year. (Code:0E-3-N98)

BI-RADS CATEGORY  1: Negative.

## 2021-12-07 ENCOUNTER — Other Ambulatory Visit: Payer: Self-pay | Admitting: Family Medicine

## 2021-12-07 DIAGNOSIS — E119 Type 2 diabetes mellitus without complications: Secondary | ICD-10-CM

## 2021-12-08 NOTE — Telephone Encounter (Signed)
Requested Prescriptions  Pending Prescriptions Disp Refills  . JANUVIA 100 MG tablet [Pharmacy Med Name: Januvia 100 MG Oral Tablet] 90 tablet 1    Sig: Take 1 tablet by mouth once daily     Endocrinology:  Diabetes - DPP-4 Inhibitors Passed - 12/07/2021  6:49 PM      Passed - HBA1C is between 0 and 7.9 and within 180 days    Hgb A1c MFr Bld  Date Value Ref Range Status  11/22/2021 7.8 (H) 4.8 - 5.6 % Final    Comment:             Prediabetes: 5.7 - 6.4          Diabetes: >6.4          Glycemic control for adults with diabetes: <7.0          Passed - Cr in normal range and within 360 days    Creatinine, Ser  Date Value Ref Range Status  08/23/2021 0.92 0.57 - 1.00 mg/dL Final         Passed - Valid encounter within last 6 months    Recent Outpatient Visits          2 weeks ago Encounter for [employer] annual wellness exam   [facility] Jerrol Banana., MD   1 month ago Upper respiratory tract infection due to COVID-19 virus   [facility] Thedore Mins, [place], PA-C   9 months ago Type 2 diabetes mellitus with diabetic polyneuropathy, without long-term current use of insulin [facility])   [facility] Jerrol Banana., MD   1 year ago Type 2 diabetes mellitus with diabetic polyneuropathy, without long-term current use of insulin [facility])   [facility] Jerrol Banana., MD   1 year ago Pre-op examination   [facility] Jerrol Banana., MD      Future Appointments            In 3 months Jerrol Banana., MD [facility] [place]   In 11 months Jerrol Banana., MD [facility]

## 2021-12-20 ENCOUNTER — Other Ambulatory Visit: Payer: Medicare PPO

## 2021-12-20 ENCOUNTER — Encounter: Payer: Self-pay | Admitting: Podiatry

## 2021-12-20 ENCOUNTER — Ambulatory Visit (INDEPENDENT_AMBULATORY_CARE_PROVIDER_SITE_OTHER): Payer: Medicare PPO | Admitting: Podiatry

## 2021-12-20 DIAGNOSIS — M79676 Pain in unspecified toe(s): Secondary | ICD-10-CM | POA: Diagnosis not present

## 2021-12-20 DIAGNOSIS — B351 Tinea unguium: Secondary | ICD-10-CM

## 2021-12-20 DIAGNOSIS — E1142 Type 2 diabetes mellitus with diabetic polyneuropathy: Secondary | ICD-10-CM

## 2021-12-20 DIAGNOSIS — L84 Corns and callosities: Secondary | ICD-10-CM

## 2021-12-20 NOTE — Progress Notes (Signed)
She presents today chief complaint of painful elongated toenails and preulcerative calluses.  States that she has been wearing shoes that she knows she should not of been wearing which is caused her calluses to get worse and she also states that her blood sugars more elevated.  She also states that her husband has been in the hospital with [facility].  Objective: Vital signs are stable alert and oriented x3.  Pulses are palpable.  No open lesions or wounds though she does have preulcerative lesion medial aspect of the hallux bilateral right greater than left which we debrided thoroughly today.  Toenails are also thick yellow dystrophic clinically mycotic.  Assessment: Pain in limb secondary to diabetic peripheral neuropathy increase in blood sugars recently.  Also pain in limb secondary to onychomycosis and ulcerative lesions.  Plan: I debrided her ulcerative lesions today and I debrided her toenails 1 through 5 bilateral.  We will follow-up with her in 2 to 3 months should she have questions or concerns she will notify us immediately.

## 2021-12-27 ENCOUNTER — Other Ambulatory Visit: Payer: Self-pay | Admitting: Family Medicine

## 2021-12-27 DIAGNOSIS — J302 Other seasonal allergic rhinitis: Secondary | ICD-10-CM

## 2022-02-07 ENCOUNTER — Other Ambulatory Visit: Payer: Self-pay | Admitting: Family Medicine

## 2022-02-07 DIAGNOSIS — I10 Essential (primary) hypertension: Secondary | ICD-10-CM

## 2022-02-07 DIAGNOSIS — K219 Gastro-esophageal reflux disease without esophagitis: Secondary | ICD-10-CM

## 2022-02-07 NOTE — Telephone Encounter (Signed)
Requested Prescriptions  Pending Prescriptions Disp Refills  . hydrochlorothiazide (HYDRODIURIL) 25 MG tablet [Pharmacy Med Name: hydroCHLOROthiazide 25 MG Oral Tablet] 90 tablet 1    Sig: Take 1 tablet by mouth once daily     Cardiovascular: Diuretics - Thiazide Failed - 02/07/2022 12:50 PM      Failed - Na in normal range and within 180 days    Sodium  Date Value Ref Range Status  08/23/2021 145 (H) 134 - 144 mmol/L Final         Passed - Cr in normal range and within 180 days    Creatinine, Ser  Date Value Ref Range Status  08/23/2021 0.92 0.57 - 1.00 mg/dL Final         Passed - K in normal range and within 180 days    Potassium  Date Value Ref Range Status  08/23/2021 3.8 3.5 - 5.2 mmol/L Final         Passed - Last BP in normal range    BP Readings from Last 1 Encounters:  11/22/21 126/80         Passed - Valid encounter within last 6 months    Recent Outpatient Visits          2 months ago Encounter for [employer] annual wellness exam   [facility] Jerrol Banana., MD   3 months ago Upper respiratory tract infection due to COVID-19 virus   [facility] Thedore Mins, [place], PA-C   11 months ago Type 2 diabetes mellitus with diabetic polyneuropathy, without long-term current use of insulin ([place])   [facility] Jerrol Banana., MD   1 year ago Type 2 diabetes mellitus with diabetic polyneuropathy, without long-term current use of insulin [facility])   [facility] Jerrol Banana., MD   1 year ago Pre-op examination   [facility] Jerrol Banana., MD      Future Appointments            In 1 month Jerrol Banana., MD [facility]   In 9 months Jerrol Banana., MD [facility]           . losartan (COZAAR) 100 MG tablet [Pharmacy Med Name: Losartan Potassium 100 MG Oral Tablet] 90 tablet 1    Sig: Take 1 tablet by mouth once  daily     Cardiovascular:  Angiotensin Receptor Blockers Passed - 02/07/2022 12:50 PM      Passed - Cr in normal range and within 180 days    Creatinine, Ser  Date Value Ref Range Status  08/23/2021 0.92 0.57 - 1.00 mg/dL Final         Passed - K in normal range and within 180 days    Potassium  Date Value Ref Range Status  08/23/2021 3.8 3.5 - 5.2 mmol/L Final         Passed - Patient is not pregnant      Passed - Last BP in normal range    BP Readings from Last 1 Encounters:  11/22/21 126/80         Passed - Valid encounter within last 6 months    Recent Outpatient Visits          2 months ago Encounter for Medicare annual wellness exam   [facility] Jerrol Banana., MD   3 months ago Upper respiratory tract infection due to COVID-19 virus  [facility] Thedore Mins, [place], PA-C   11 months ago Type 2 diabetes mellitus with diabetic polyneuropathy, without long-term current use of insulin [facility])   [facility] Jerrol Banana., MD   1 year ago Type 2 diabetes mellitus with diabetic polyneuropathy, without long-term current use of insulin [facility])   [facility] Jerrol Banana., MD   1 year ago Pre-op examination   [facility] Jerrol Banana., MD      Future Appointments            In 1 month Jerrol Banana., MD [facility]   In 9 months Jerrol Banana., MD [facility]           . pantoprazole (PROTONIX) 40 MG tablet [Pharmacy Med Name: Pantoprazole Sodium 40 MG Oral Tablet Delayed Release] 90 tablet 3    Sig: Take 1 tablet by mouth once daily     Gastroenterology: Proton Pump Inhibitors Passed - 02/07/2022 12:50 PM      Passed - Valid encounter within last 12 months    Recent Outpatient Visits          2 months ago Encounter for [employer] annual wellness exam   [facility] Jerrol Banana., MD   3  months ago Upper respiratory tract infection due to COVID-19 virus   [facility] Thedore Mins, [place], PA-C   11 months ago Type 2 diabetes mellitus with diabetic polyneuropathy, without long-term current use of insulin [facility])   [facility] Jerrol Banana., MD   1 year ago Type 2 diabetes mellitus with diabetic polyneuropathy, without long-term current use of insulin [facility])   [facility] Jerrol Banana., MD   1 year ago Pre-op examination   [facility] Jerrol Banana., MD      Future Appointments            In 1 month Jerrol Banana., MD [facility] [place]   In 9 months Jerrol Banana., MD [facility]

## 2022-02-21 ENCOUNTER — Ambulatory Visit: Payer: Medicare PPO | Admitting: Podiatry

## 2022-02-28 ENCOUNTER — Ambulatory Visit (INDEPENDENT_AMBULATORY_CARE_PROVIDER_SITE_OTHER): Payer: Medicare PPO | Admitting: Podiatry

## 2022-02-28 ENCOUNTER — Encounter: Payer: Self-pay | Admitting: Podiatry

## 2022-02-28 DIAGNOSIS — E1142 Type 2 diabetes mellitus with diabetic polyneuropathy: Secondary | ICD-10-CM

## 2022-02-28 DIAGNOSIS — B351 Tinea unguium: Secondary | ICD-10-CM

## 2022-02-28 DIAGNOSIS — L97511 Non-pressure chronic ulcer of other part of right foot limited to breakdown of skin: Secondary | ICD-10-CM

## 2022-02-28 DIAGNOSIS — D2372 Other benign neoplasm of skin of left lower limb, including hip: Secondary | ICD-10-CM | POA: Diagnosis not present

## 2022-02-28 DIAGNOSIS — D2371 Other benign neoplasm of skin of right lower limb, including hip: Secondary | ICD-10-CM

## 2022-02-28 DIAGNOSIS — M79676 Pain in unspecified toe(s): Secondary | ICD-10-CM

## 2022-02-28 DIAGNOSIS — L84 Corns and callosities: Secondary | ICD-10-CM

## 2022-02-28 MED ORDER — MUPIROCIN 2 % EX OINT: 1.0000 | TOPICAL_OINTMENT | Freq: Two times a day (BID) | CUTANEOUS | 0 refills | Status: DC

## 2022-02-28 NOTE — Progress Notes (Signed)
She presents today chief concern of an ulcer plantar aspect fifth met head right foot.  States that it started out as a blister about 7 weeks ago she says I feel like it is coming from stress between my husband and my house renovation.  Denies fever chills nausea vomiting states that she did take some antibiotics that she had at home is currently dressing the wound daily with Bactroban ointment and trying to keep it dry.  Objective: Vital signs are stable she is alert and oriented x3.  Pulses are palpable.  Considerable mount of reactive hyperkeratotic tissue ulcerative lesion measures about a centimeter in diameter which once debrided today with clippers and a 15 blade was extended to about 2.5 cm in diameter this was able to bleed did not probe deep to bone.  No purulence no malodor she also has reactive hyperkeratotic preulcerative lesion to the medial aspect of the hallux right.  Nails are long and dystrophic.  Assessment: Diabetic peripheral vascular disease diabetic peripheral neuropathy diabetic ulceration Sub fifth met right.  Diabetic preulcerative lesion hallux right.  Pain limb secondary to onychomycosis.  Plan: Debrided toenails 1 through 5 for her.  Debrided sharply today the ulcer.  Placed dressing and padding dispensed another prescription for Bactroban ointment I would like to for her to wear her surgical shoe I would like to follow-up with her in about 2 weeks

## 2022-03-14 ENCOUNTER — Ambulatory Visit: Payer: Medicare PPO | Admitting: Podiatry

## 2022-03-14 DIAGNOSIS — L97512 Non-pressure chronic ulcer of other part of right foot with fat layer exposed: Secondary | ICD-10-CM

## 2022-03-14 NOTE — Patient Instructions (Signed)
Look for urea 40% cream or ointment and apply to the thickened dry skin / calluses. This can be bought over the counter, at a pharmacy or online such as [employer].  

## 2022-03-14 NOTE — Progress Notes (Signed)
  Subjective:  Patient ID: Karen Stephenson, female    DOB: 10/13/1955,  MRN: [id number]  Chief Complaint  Patient presents with   Diabetic Ulcer    2 week follow up    66 y.o. female presents with the above complaint. History confirmed with patient.  She is here for follow-up, she saw Dr. Milinda Stephenson 2 weeks ago for a new ulceration of the right foot  Objective:  Physical Exam: warm, good capillary refill, normal DP and PT pulses, normal sensory exam, and full-thickness ulcer measuring 0.6x0.2 to up 0.3 cm on the right submetatarsal 5.  She has a hypertrophic callus with ecchymosis deep to this on the medial hallux  Assessment:   1. Ulcer of right foot with fat layer exposed (Karen Stephenson)      Plan:  Patient was evaluated and treated and all questions answered.  Wound was debrided in an excisional manner in full-thickness fashion using sharp scalpel of all nonviable tissue.  Postdebridement measurements are noted above.  Recommend she continue to use the mupirocin ointment and bandage daily.  We discussed the presence of the hypertrophic calluses and discussed fit of shoe gear.  I recommend she begin using urea 40% cream on these to soften the area.  She will return in 2 weeks for follow-up.  Return in about 3 weeks (around 04/04/2022) for wound care.

## 2022-03-16 ENCOUNTER — Ambulatory Visit: Payer: Self-pay

## 2022-03-16 NOTE — Patient Outreach (Signed)
  Care Coordination   Initial Visit Note   03/16/2022 Name: Karen Stephenson MRN: [id number] DOB: Jul 28, 1955  Karen Stephenson is a 66 y.o. year old female who sees Karen Stephenson., MD for primary care. I spoke with  Karen Stephenson by phone today.  What matters to the patients health and wellness today?  Healing of right foot ulcer and managing her DM    Goals Addressed             This Visit's Progress    RNCM: Effective Management of DM       Care Coordination Interventions:  Lab Results  Component Value Date   HGBA1C 7.8 (H) 11/22/2021    Provided education to patient about basic DM disease process. The patient is being treated by podiatry for a right ulcer of her right foot. The patient states it is healing well and the provider is happy with the progress of healing. The patient puts ointment on it and wraps as directed. Keeps area clean. Reviewed medications with patient and discussed importance of medication adherence Counseled on importance of regular laboratory monitoring as prescribed Discussed plans with patient for ongoing care management follow up and provided patient with direct contact information for care management team Provided patient with written educational materials related to hypo and hyperglycemia and importance of correct treatment Reviewed scheduled/upcoming provider appointments including: 03-28-2022 at 10 am Advised patient, providing education and rationale, to check cbg as directed and record, calling pcp for findings outside established parameters. The patient admits she does not always check her blood sugars as she should. Education on the goal of therapy <130 fasting and <180 post prandial. Encouraged the patient to take her blood sugars more regularly and writing down the numbers to take to the pcp upcoming visit. The patient verbalized understanding.  Review of patient status, including review of consultants reports, relevant laboratory and other test  results, and medications completed Screening for signs and symptoms of depression related to chronic disease state  Assessed social determinant of health barriers The patient agrees to regular outreaches from the [facility]. Review of the care coordination program and the patient knows how to reach the [facility]. The patient did not have long to talk today due to her husband being in the hospital and she had to go.,           SDOH assessments and interventions completed:  Yes  SDOH Interventions Today    Flowsheet Row Most Recent Value  SDOH Interventions   Transportation Interventions Intervention Not Indicated  Utilities Interventions Intervention Not Indicated        Care Coordination Interventions Activated:  Yes  Care Coordination Interventions:  Yes, provided   Follow up plan: Follow up call scheduled for 05-16-2022 at 11 am    Encounter Outcome:  Pt. Visit Completed   Karen Larsson RN, MSN, [facility] Mobile: [phone]

## 2022-03-16 NOTE — Patient Instructions (Signed)
Visit Information  Thank you for taking time to visit with me today. Please don't hesitate to contact me if I can be of assistance to you.   Following are the goals we discussed today:   Goals Addressed             This Visit's Progress    RNCM: Effective Management of DM       Care Coordination Interventions:  Lab Results  Component Value Date   HGBA1C 7.8 (H) 11/22/2021    Provided education to patient about basic DM disease process. The patient is being treated by podiatry for a right ulcer of her right foot. The patient states it is healing well and the provider is happy with the progress of healing. The patient puts ointment on it and wraps as directed. Keeps area clean. Reviewed medications with patient and discussed importance of medication adherence Counseled on importance of regular laboratory monitoring as prescribed Discussed plans with patient for ongoing care management follow up and provided patient with direct contact information for care management team Provided patient with written educational materials related to hypo and hyperglycemia and importance of correct treatment Reviewed scheduled/upcoming provider appointments including: 03-28-2022 at 10 am Advised patient, providing education and rationale, to check cbg as directed and record, calling pcp for findings outside established parameters. The patient admits she does not always check her blood sugars as she should. Education on the goal of therapy <130 fasting and <180 post prandial. Encouraged the patient to take her blood sugars more regularly and writing down the numbers to take to the pcp upcoming visit. The patient verbalized understanding.  Review of patient status, including review of consultants reports, relevant laboratory and other test results, and medications completed Screening for signs and symptoms of depression related to chronic disease state  Assessed social determinant of health barriers The  patient agrees to regular outreaches from the [facility] Review of the care coordination program and the patient knows how to reach the [facility]. The patient did not have long to talk today due to her husband being in the hospital and she had to go.,           Our next appointment is by telephone on 05-16-2022 at 11 am  Please call the care guide team at [phone] if you need to cancel or reschedule your appointment.   If you are experiencing a Mental Health or [place] or need someone to talk to, please call the Suicide and Crisis Lifeline: 988 call the [place] National Suicide Prevention Lifeline: (803)350-1541 or TTY: 972-769-3880 TTY (458)460-2008) to talk to a trained counselor call 1-800-273-TALK (toll free, 24 hour hotline)  Patient verbalizes understanding of instructions and care plan provided today and agrees to view in [place]. Active MyChart status and patient understanding of how to access instructions and care plan via MyChart confirmed with patient.     Telephone follow up appointment with care management team member scheduled for: 05-16-2022 at 48 am  Athalia, MSN, [facility] Mobile: [phone]

## 2022-03-28 ENCOUNTER — Ambulatory Visit: Payer: Medicare PPO | Admitting: Family Medicine

## 2022-04-04 ENCOUNTER — Encounter: Payer: Self-pay | Admitting: Podiatry

## 2022-04-04 ENCOUNTER — Ambulatory Visit: Payer: Medicare PPO | Admitting: Podiatry

## 2022-04-04 DIAGNOSIS — L97521 Non-pressure chronic ulcer of other part of left foot limited to breakdown of skin: Secondary | ICD-10-CM

## 2022-04-04 DIAGNOSIS — L97512 Non-pressure chronic ulcer of other part of right foot with fat layer exposed: Secondary | ICD-10-CM | POA: Diagnosis not present

## 2022-04-04 DIAGNOSIS — E1142 Type 2 diabetes mellitus with diabetic polyneuropathy: Secondary | ICD-10-CM

## 2022-04-04 DIAGNOSIS — L97511 Non-pressure chronic ulcer of other part of right foot limited to breakdown of skin: Secondary | ICD-10-CM | POA: Diagnosis not present

## 2022-04-04 MED ORDER — CLINDAMYCIN HCL 150 MG PO CAPS: 150.0000 mg | ORAL_CAPSULE | Freq: Three times a day (TID) | ORAL | 1 refills | Status: DC

## 2022-04-04 NOTE — Progress Notes (Signed)
For now presents today for follow-up of her ulceration right foot she was seen by Dr. Sherryle Lis last visit.  She was doing better at that time.  Her husband has since been placed in the hospital and she has been doing a lot of walking.  She states that she denies fever chills nausea vomiting muscle aches pains.  States that my right foot has gotten worse.  Objective: Vital signs are stable she alert and oriented x3 pulses palpable to the right lower extremity the leg is warm to the touch as is the other leg.  There is no calf pain.  Her ulceration to the plantar aspect of the Sub fifth metatarsal head right foot probes subcutaneously dorsally where there is an abscess that has formed with copious amounts of red purulent material which was sampled for culture and sensitivity today.  I debrided a large majority of the necrotic tissue noting it did not probe to bone.  Assessment large undermining ulcerations of fifth metatarsal head right foot measuring 2 cm in diameter not including the subcutaneous undermining which would be 10 cm at least.  Plan: Samples of the wound were taken today to be sent for culture and sensitivity I also placed her on clindamycin for 2 weeks keep this foot dry and clean utilize clean dressing daily and her [place] shoe.  She was given specific instruction should she develop fever chills nausea vomiting she is to go urgently to the emergency department.  Otherwise I will follow-up with her in 1 week.

## 2022-04-07 LAB — WOUND CULTURE

## 2022-04-10 NOTE — Progress Notes (Addendum)
I,Karen Stephenson,acting as a scribe for [employer], MD.,have documented all relevant documentation on the behalf of Karen Foster, MD,as directed by  Karen Foster, MD while in the presence of Karen Foster, MD.   Established patient visit   Patient: Karen Stephenson   DOB: [birth year]   66 y.o. Female  MRN: [id number] Visit Date: 04/11/2022  Today's healthcare provider: [name], MD   Chief Complaint  Patient presents with   Follow-up chronic Disease   Subjective    HPI  Diabetes Mellitus Type II, Follow-up  Lab Results  Component Value Date   HGBA1C 7.4 (A) 04/11/2022   HGBA1C 7.8 (H) 11/22/2021   HGBA1C 7.3 (A) 02/15/2021   Wt Readings from Last 3 Encounters:  04/11/22 258 lb (117 kg)  11/22/21 258 lb (117 kg)  10/04/21 261 lb (118.4 kg)   Last seen for diabetes 4 months ago.  Management since then includes none. She reports excellent compliance with treatment. She is not having side effects.    Home blood sugar records:  checking occasional-120's   Episodes of hypoglycemia? No    Current insulin regiment: none Most Recent Eye Exam: Is over due Current exercise: none Current diet habits: well balanced and low sodium.  Pertinent Labs: Lab Results  Component Value Date   CHOL 117 11/22/2021   HDL 36 (L) 11/22/2021   LDLCALC 58 11/22/2021   TRIG 127 11/22/2021   CHOLHDL 3.3 11/22/2021   Lab Results  Component Value Date   NA 145 (H) 08/23/2021   K 3.8 08/23/2021   CREATININE 0.92 08/23/2021   EGFR 68 08/23/2021   MICROALBUR 20 09/23/2019   LABMICR <3.0 04/11/2022     ---------------------------------------------------------------------------------------------------  Hypertension, follow-up  BP Readings from Last 3 Encounters:  04/11/22 107/63  11/22/21 126/80  09/14/21 130/74   Wt Readings from Last 3 Encounters:  04/11/22 258 lb (117 kg)  11/22/21 258 lb (117 kg)  10/04/21 261  lb (118.4 kg)     She was last seen for hypertension 4 months ago.  BP at that visit was 126/80. Management since that visit includes continue current treatment.  She reports excellent compliance with treatment. She is not having side effects.  She does not smoke.  Outside blood pressures are not being checked. Symptoms: No chest pain No chest pressure  No palpitations No syncope  No dyspnea No orthopnea  No paroxysmal nocturnal dyspnea No lower extremity edema   Pertinent labs Lab Results  Component Value Date   CHOL 117 11/22/2021   HDL 36 (L) 11/22/2021   LDLCALC 58 11/22/2021   TRIG 127 11/22/2021   CHOLHDL 3.3 11/22/2021   Lab Results  Component Value Date   NA 145 (H) 08/23/2021   K 3.8 08/23/2021   CREATININE 0.92 08/23/2021   EGFR 68 08/23/2021   GLUCOSE 164 (H) 08/23/2021   TSH 2.450 11/22/2021     The ASCVD Risk score (Arnett DK, et al., 2019) failed to calculate for the following reasons:   The valid total cholesterol range is 130 to 320 mg/dL  ---------------------------------------------------------------------------------------------------  Medications: Outpatient Medications Prior to Visit  Medication Sig   albuterol (VENTOLIN HFA) 108 (90 Base) MCG/ACT inhaler Inhale 2 puffs into the lungs every 6 (six) hours as needed for wheezing or shortness of breath.   aspirin EC 81 MG tablet Take 81 mg by mouth daily. Swallow whole.   cetirizine (ZYRTEC) 10 MG tablet Take 10 mg by mouth daily.   clopidogrel (  PLAVIX) 75 MG tablet Take 1 tablet (75 mg total) by mouth daily.   fluticasone (FLONASE) 50 MCG/ACT nasal spray Use 2 spray(s) in each nostril once daily   fluticasone-salmeterol (ADVAIR DISKUS) 250-50 MCG/ACT AEPB Inhale 1 puff into the lungs in the morning and at bedtime.   glucose blood test strip Check sugar once daily, E 11.9   Homeopathic Products (LEG CRAMP RELIEF PO) Take 2 capsules by mouth daily.    hydrochlorothiazide (HYDRODIURIL) 25 MG tablet  Take 1 tablet by mouth once daily   hyoscyamine (LEVBID) 0.375 MG 12 hr tablet Take 1 tablet (0.375 mg total) by mouth as needed. (Patient taking differently: Take 0.375 mg by mouth every 12 (twelve) hours as needed for cramping.)   Lancets (ONETOUCH ULTRASOFT) lancets Check sugar once daily ,DX E11.9   losartan (COZAAR) 100 MG tablet Take 1 tablet by mouth once daily   montelukast (SINGULAIR) 10 MG tablet TAKE 1 TABLET BY MOUTH AT BEDTIME   Multiple Vitamins-Minerals (MULTIVITAMIN WITH MINERALS) tablet Take 1 tablet by mouth daily.   mupirocin ointment (BACTROBAN) 2 % Apply 1 Application topically 2 (two) times daily.   pantoprazole (PROTONIX) 40 MG tablet Take 1 tablet by mouth once daily   rosuvastatin (CRESTOR) 20 MG tablet Take 1 tablet by mouth once daily   vitamin B-12 (CYANOCOBALAMIN) 1000 MCG tablet Take 1,000 mcg by mouth daily.   Vitamin D, Cholecalciferol, 50 MCG (2000 UT) CAPS Take 2,000 Units by mouth daily.   zinc gluconate 50 MG tablet Take 50 mg by mouth daily.   [DISCONTINUED] acarbose (PRECOSE) 25 MG tablet TAKE 1 TABLET BY MOUTH THREE TIMES DAILY WITH MEALS   [DISCONTINUED] JANUVIA 100 MG tablet Take 1 tablet by mouth once daily   [DISCONTINUED] JARDIANCE 10 MG TABS tablet Take 1 tablet by mouth once daily   [DISCONTINUED] chlorpheniramine-HYDROcodone (TUSSIONEX PENNKINETIC ER) 10-8 MG/5ML Take 5 mLs by mouth every 12 (twelve) hours as needed for cough. (Patient not taking: Reported on 04/11/2022)   [DISCONTINUED] clindamycin (CLEOCIN) 150 MG capsule Take 1 capsule (150 mg total) by mouth 3 (three) times daily. (Patient not taking: Reported on 04/11/2022)   No facility-administered medications prior to visit.    Review of Systems  Last metabolic panel Lab Results  Component Value Date   GLUCOSE 164 (H) 08/23/2021   NA 145 (H) 08/23/2021   K 3.8 08/23/2021   CL 101 08/23/2021   CO2 22 08/23/2021   BUN 13 08/23/2021   CREATININE 0.92 08/23/2021   EGFR 68 08/23/2021    CALCIUM 9.7 08/23/2021   PROT 7.3 06/08/2020   ALBUMIN 4.2 06/08/2020   LABGLOB 3.1 06/08/2020   AGRATIO 1.4 06/08/2020   BILITOT 0.6 06/08/2020   ALKPHOS 71 06/08/2020   AST 24 06/08/2020   ALT 24 06/08/2020       Objective    BP 107/63 (BP Location: Left Arm, Patient Position: Sitting, Cuff Size: Large)   Pulse 73   Temp 98 F (36.7 C) (Oral)   Resp 16   Ht 5' 8"  (1.727 m)   Wt 258 lb (117 kg)   BMI 39.23 kg/m    Physical Exam Vitals reviewed.  Constitutional:      General: She is not in acute distress.    Appearance: Normal appearance. She is not ill-appearing, toxic-appearing or diaphoretic.  Eyes:     Conjunctiva/sclera: Conjunctivae normal.  Cardiovascular:     Rate and Rhythm: Normal rate and regular rhythm.     Pulses: Normal pulses.  Heart sounds: Normal heart sounds. No murmur heard.    No friction rub. No gallop.  Pulmonary:     Effort: Pulmonary effort is normal. No respiratory distress.     Breath sounds: Normal breath sounds. No stridor. No wheezing, rhonchi or rales.  Feet:     Comments: Wearing post surgical shoes  Demonstrates normal ROM of toes   Neurological:     Mental Status: She is alert and oriented to person, place, and time.       Results for orders placed or performed in visit on 04/11/22  Urine microalbumin-creatinine with uACR  Result Value Ref Range   Creatinine, Urine 49.5 Not Estab. mg/dL   Microalbumin, Urine <3.0 Not Estab. ug/mL   Microalb/Creat Ratio <6 0 - 29 mg/g creat  Specimen status report  Result Value Ref Range   specimen status report Comment   POCT glycosylated hemoglobin (Hb A1C)  Result Value Ref Range   Hemoglobin A1C 7.4 (A) 4.0 - 5.6 %   Est. average glucose Bld gHb Est-mCnc 166     Assessment & Plan     Problem List Items Addressed This Visit       Cardiovascular and Mediastinum   Essential (primary) hypertension    Chronic, controlled  She will continue HCTZ 61m and losartan 1019mdaily   Reviewed metabolic panel from Feb 201423with normal creatinine and potassium, will not make changes to medications today         Endocrine   Diabetes mellitus, type 2 (HCC) - Primary    Chronic, stable  A1c improved, repeat today at 7.4 from 7.8  Will increase jardiance from 1089mo 72m45mily  She will continue januvia 100mg41mly and acarbose 72mg 17my  Recommended checking fasting BG as well as PP 1-2 hours after largest meal of the day  She will follow up in 3 months  Urine microalbumin collected today  Due for foot exam, patient follows with Dr. Hyatt Milinda Pointerodiatry care, currently being monitored for right foot ulcer with appointment following her appt here today (foot is wrapped so will not due additional exam today) She will schedule ophthalmology exam        Relevant Medications   empagliflozin (JARDIANCE) 25 MG TABS tablet   Other Relevant Orders   POCT glycosylated hemoglobin (Hb A1C) (Completed)   Urine microalbumin-creatinine with uACR (Completed)   Other Visit Diagnoses     Need for immunization against influenza       Relevant Orders   Flu Vaccine QUAD High Dose(Fluad) (Completed)        Return in about 3 months (around 07/12/2022) for DM.      I, MakierEulis Fosterhave reviewed all documentation for this visit. The documentation on 05/30/22 for the exam, diagnosis, procedures, and orders are all accurate and complete.    MakierEulis FosterBurlinHerndon Surgery Center [phone]) [phone]  [facility]

## 2022-04-11 ENCOUNTER — Encounter: Payer: Self-pay | Admitting: Podiatry

## 2022-04-11 ENCOUNTER — Ambulatory Visit: Payer: Medicare PPO | Admitting: Family Medicine

## 2022-04-11 ENCOUNTER — Encounter: Payer: Self-pay | Admitting: Family Medicine

## 2022-04-11 ENCOUNTER — Ambulatory Visit (INDEPENDENT_AMBULATORY_CARE_PROVIDER_SITE_OTHER): Payer: Medicare PPO | Admitting: Podiatry

## 2022-04-11 VITALS — BP 107/63 | HR 73 | Temp 98.0°F | Resp 16 | Ht 68.0 in | Wt 258.0 lb

## 2022-04-11 DIAGNOSIS — E1142 Type 2 diabetes mellitus with diabetic polyneuropathy: Secondary | ICD-10-CM | POA: Diagnosis not present

## 2022-04-11 DIAGNOSIS — L97512 Non-pressure chronic ulcer of other part of right foot with fat layer exposed: Secondary | ICD-10-CM | POA: Diagnosis not present

## 2022-04-11 DIAGNOSIS — L97521 Non-pressure chronic ulcer of other part of left foot limited to breakdown of skin: Secondary | ICD-10-CM | POA: Diagnosis not present

## 2022-04-11 DIAGNOSIS — Z23 Encounter for immunization: Secondary | ICD-10-CM

## 2022-04-11 DIAGNOSIS — L97511 Non-pressure chronic ulcer of other part of right foot limited to breakdown of skin: Secondary | ICD-10-CM | POA: Diagnosis not present

## 2022-04-11 DIAGNOSIS — I10 Essential (primary) hypertension: Secondary | ICD-10-CM

## 2022-04-11 LAB — POCT GLYCOSYLATED HEMOGLOBIN (HGB A1C)
Est. average glucose Bld gHb Est-mCnc: 166
Hemoglobin A1C: 7.4 % — AB (ref 4.0–5.6)

## 2022-04-11 MED ORDER — EMPAGLIFLOZIN 25 MG PO TABS: 25.0000 mg | ORAL_TABLET | Freq: Every day | ORAL | 1 refills | Status: DC

## 2022-04-11 NOTE — Assessment & Plan Note (Addendum)
Chronic, stable  A1c improved, repeat today at 7.4 from 7.8  Will increase jardiance from '10mg'$  to '25mg'$  daily  She will continue [place] '100mg'$  daily and acarbose '25mg'$  daily  Recommended checking fasting BG as well as PP 1-2 hours after largest meal of the day  She will follow up in 3 months  Urine microalbumin collected today  Due for foot exam, patient follows with Dr. Milinda Pointer for podiatry care, currently being monitored for right foot ulcer with appointment following her appt here today (foot is wrapped so will not due additional exam today) She will schedule ophthalmology exam

## 2022-04-11 NOTE — Assessment & Plan Note (Signed)
Chronic, controlled  She will continue HCTZ '25mg'$  and losartan '100mg'$  daily  Reviewed metabolic panel from Feb 4709, with normal creatinine and potassium, will not make changes to medications today

## 2022-04-12 LAB — MICROALBUMIN / CREATININE URINE RATIO
Creatinine, Urine: 49.5 mg/dL
Microalb/Creat Ratio: <6 mg/g creat (ref 0–29)
Microalbumin, Urine: <3 ug/mL

## 2022-04-12 LAB — SPECIMEN STATUS REPORT

## 2022-04-12 NOTE — Progress Notes (Signed)
She presents today for follow-up of ulcer ulcer fifth metatarsal right foot and hallux bilaterally.  She states that she is doing much better.  States that her blood sugar is coming down.  She continues to apply Betadine to the ulcerative sites daily.  Objective: Vital signs stable oriented x3 presents in her Darco shoes today once removed demonstrates wounds are 60% improved.  Mild reactive hyperkeratotic tissue no large open lesions.  Assessment: Well-healing diabetic ulceration bilateral.  Plan: Debrided all necrotic tissue placed padding recommend she continue the Betadine dressings daily and continued use of her Darco shoes until all of this has completely healed and her skin is normal once again.

## 2022-04-17 ENCOUNTER — Telehealth: Payer: Self-pay | Admitting: Family Medicine

## 2022-04-17 ENCOUNTER — Other Ambulatory Visit: Payer: Self-pay | Admitting: Family Medicine

## 2022-04-17 DIAGNOSIS — E119 Type 2 diabetes mellitus without complications: Secondary | ICD-10-CM

## 2022-04-17 MED ORDER — ACARBOSE 25 MG PO TABS: 25.0000 mg | ORAL_TABLET | Freq: Three times a day (TID) | ORAL | 1 refills | Status: DC

## 2022-04-17 NOTE — Telephone Encounter (Signed)
Unable to refill per protocol, last refill 04/17/22. Will refuse duplicate request.  Requested Prescriptions  Pending Prescriptions Disp Refills  . acarbose (PRECOSE) 25 MG tablet [Pharmacy Med Name: Acarbose 25 MG Oral Tablet] 270 tablet 0    Sig: TAKE 1 TABLET BY MOUTH THREE TIMES DAILY WITH MEALS     Endocrinology:  Diabetes - Alpha Glucosidase Inhibitors - acarbose Failed - 04/17/2022  1:53 PM      Failed - AST in normal range and within 360 days    AST  Date Value Ref Range Status  06/08/2020 24 0 - 40 IU/L Final         Failed - ALT in normal range and within 360 days    ALT  Date Value Ref Range Status  06/08/2020 24 0 - 32 IU/L Final         Passed - HBA1C is between 0 and 7.9 and within 180 days    Hemoglobin A1C  Date Value Ref Range Status  04/11/2022 7.4 (A) 4.0 - 5.6 % Final   Hgb A1c MFr Bld  Date Value Ref Range Status  11/22/2021 7.8 (H) 4.8 - 5.6 % Final    Comment:             Prediabetes: 5.7 - 6.4          Diabetes: >6.4          Glycemic control for adults with diabetes: <7.0          Passed - Cr in normal range and within 360 days    Creatinine, Ser  Date Value Ref Range Status  08/23/2021 0.92 0.57 - 1.00 mg/dL Final         Passed - Valid encounter within last 6 months    Recent Outpatient Visits          6 days ago Type 2 diabetes mellitus with diabetic polyneuropathy, without long-term current use of insulin ([place])   [place] Simmons-Robinson, [place], MD   4 months ago Encounter for [employer] annual wellness exam   [employer] Jerrol Banana., MD   5 months ago Upper respiratory tract infection due to COVID-19 virus   [employer], [place], PA-C   1 year ago Type 2 diabetes mellitus with diabetic polyneuropathy, without long-term current use of insulin [facility])   [facility] Jerrol Banana., MD   1 year ago Type 2 diabetes mellitus with diabetic polyneuropathy,  without long-term current use of insulin [facility])   [facility] Jerrol Banana., MD

## 2022-04-17 NOTE — Telephone Encounter (Signed)
Medication Refill - Medication: acarbose (PRECOSE) 25 MG tablet  Has the patient contacted their pharmacy? Yes.   Pt needs a new script by Quentin Cornwall (Agent: If no, request that the patient contact the pharmacy for the refill. If patient does not wish to contact the pharmacy document the reason why and proceed with request.) (Agent: If yes, when and what did the pharmacy advise?) call dr  Preferred Pharmacy (with phone number or street name):  [place], [place] - [address]  [place] [address]  Phone: [phone] Fax: [phone]  Hours: Not open 24 hours   Has the patient been seen for an appointment in the last year OR does the patient have an upcoming appointment? Yes.  04/11/22   Agent: Please be advised that RX refills may take up to 3 business days. We ask that you follow-up with your pharmacy.

## 2022-04-30 ENCOUNTER — Encounter (INDEPENDENT_AMBULATORY_CARE_PROVIDER_SITE_OTHER): Payer: Self-pay

## 2022-05-15 ENCOUNTER — Ambulatory Visit: Payer: Self-pay | Admitting: *Deleted

## 2022-05-15 ENCOUNTER — Telehealth: Payer: Self-pay

## 2022-05-15 NOTE — Patient Outreach (Signed)
  Care Coordination   Follow Up Visit Note   05/15/2022 Name: Karen Stephenson MRN: [id number] DOB: [birth year]  Karen Stephenson is a 66 y.o. year old female who sees Simmons-Robinson, Riki Sheer, MD for primary care. I spoke with  Karen Stephenson by phone today.  What matters to the patients health and wellness today?  Ongoing management of DM, A1C trending down.  Jardiance increased from '10mg'$  to '25mg'$ .  Denies any urgent concerns, encouraged to contact this care manager with questions.      Goals Addressed             This Visit's Progress    RNCM: Effective Management of DM   On track    Care Coordination Interventions:  Lab Results  Component Value Date   HGBA1C 7.4 (A) 04/11/2022    Provided education to patient about basic DM disease process. The patient is being treated by podiatry for a right ulcer of her right foot. The patient states it is healing well and the provider is happy with the progress of healing. The patient puts ointment on it and wraps as directed. Keeps area clean. Reviewed medications with patient and discussed importance of medication adherence Counseled on importance of regular laboratory monitoring as prescribed Discussed plans with patient for ongoing care management follow up and provided patient with direct contact information for care management team Provided patient with written educational materials related to hypo and hyperglycemia and importance of correct treatment Reviewed scheduled/upcoming provider appointments including: Foot center for wound care tomorrow and AWV in April 2024 Advised patient, providing education and rationale, to check cbg as directed and record, calling pcp for findings outside established parameters. The patient admits she does not always check her blood sugars as she should. Education on the goal of therapy <130 fasting and <180 post prandial. Encouraged the patient to take her blood sugars more regularly and writing down the numbers  to take to the pcp upcoming visit. The patient verbalized understanding. Blood sugar yesterday, 11/13, was 138, did not check today Review of patient status, including review of consultants reports, relevant laboratory and other test results, and medications completed The patient agrees to regular outreaches from the [facility]. Review of the care coordination program and the patient knows how to reach the [facility].           SDOH assessments and interventions completed:  No     Care Coordination Interventions Activated:  Yes  Care Coordination Interventions:  Yes, provided   Follow up plan: Follow up call scheduled for 12/20    Encounter Outcome:  Pt. Visit Completed   Valente David, RN, MSN, [place] Care Management Care Management Coordinator [phone]

## 2022-05-15 NOTE — Telephone Encounter (Signed)
Copied from [place] [id number]. Topic: General - Other >> May 15, 2022  3:43 PM Jacinto Reap M wrote: Reason for CRM: Pt stated she was returning call that she received earlier. Attempted to transfer pt but there was no answer. Pt requests call back

## 2022-05-15 NOTE — Patient Instructions (Signed)
Visit Information  Thank you for taking time to visit with me today. Please don't hesitate to contact me if I can be of assistance to you before our next scheduled telephone appointment.  Following are the goals we discussed today:  Monitor blood sugar daily. Continue diet change, increase exercise as tolerated once foot healed.  Our next appointment is by telephone on 12/20  Please call the care guide team at [phone] if you need to cancel or reschedule your appointment.   Please call the Suicide and Crisis Lifeline: 988 call the [place] National Suicide Prevention Lifeline: (838)491-3946 or TTY: 629-561-8395 TTY (815)169-1893) to talk to a trained counselor call 1-800-273-TALK (toll free, 24 hour hotline) call 911 if you are experiencing a Mental Health or [place] or need someone to talk to.  Patient verbalizes understanding of instructions and care plan provided today and agrees to view in [place]. Active MyChart status and patient understanding of how to access instructions and care plan via MyChart confirmed with patient.     The patient has been provided with contact information for the care management team and has been advised to call with any health related questions or concerns.   Valente David, RN, MSN, [place] Care Management Care Management Coordinator [phone]

## 2022-05-15 NOTE — Patient Outreach (Signed)
  Care Coordination   Follow Up Visit Note   05/15/2022 Name: Karen Stephenson MRN: [id number] DOB: 02/13/1956  Karen Stephenson is a 66 y.o. year old female who sees Karen Stephenson for primary care. I spoke with  Karen Stephenson by phone today.  What matters to the patients health and wellness today?  State she is unable talk, currently at work and will call back.     SDOH assessments and interventions completed:  No     Care Coordination Interventions Activated:  No  Care Coordination Interventions:  No, not indicated   Follow up plan:  If no call back, will have care guide to reschedule.    Encounter Outcome:  Pt. Request to Call Back   [place], RN,MSN, [place] Management Care Management Coordinator [phone]

## 2022-05-16 ENCOUNTER — Encounter: Payer: Self-pay | Admitting: Podiatry

## 2022-05-16 ENCOUNTER — Ambulatory Visit: Payer: Medicare PPO | Admitting: Podiatry

## 2022-05-16 DIAGNOSIS — L97511 Non-pressure chronic ulcer of other part of right foot limited to breakdown of skin: Secondary | ICD-10-CM

## 2022-05-16 DIAGNOSIS — L97512 Non-pressure chronic ulcer of other part of right foot with fat layer exposed: Secondary | ICD-10-CM

## 2022-05-16 DIAGNOSIS — E1142 Type 2 diabetes mellitus with diabetic polyneuropathy: Secondary | ICD-10-CM | POA: Diagnosis not present

## 2022-05-16 DIAGNOSIS — M79676 Pain in unspecified toe(s): Secondary | ICD-10-CM | POA: Diagnosis not present

## 2022-05-16 DIAGNOSIS — B351 Tinea unguium: Secondary | ICD-10-CM

## 2022-05-16 DIAGNOSIS — L97521 Non-pressure chronic ulcer of other part of left foot limited to breakdown of skin: Secondary | ICD-10-CM

## 2022-05-16 NOTE — Progress Notes (Signed)
She presents today for follow-up of her ulceration to her plantar lateral aspect of her fifth metatarsal of the right foot.  This is the only wound that has not healed at this point.  She states that she continues to wear her Darco shoes and work on the foot is much as possible.  States that she has not been soaking it which may be one of the reasons it has not healed as well as it did the previous time.  Denies fever chills nausea vomiting muscle aches and pains.  Objective: Vital signs stable alert oriented x3 type 2 diabetes mellitus diabetic polyneuropathy is well as diabetic ulceration Sub fifth met head right.  Assessment: Slow healing diabetic ulceration Sub fifth right.  Plan: Sharp debridement of the area today initially measuring 0 mm but once debrided measures approximately 2.5 cm in diameter.  We redressed the foot today we will follow-up with her in the near future.

## 2022-05-30 ENCOUNTER — Telehealth: Payer: Self-pay | Admitting: Family Medicine

## 2022-05-30 DIAGNOSIS — E119 Type 2 diabetes mellitus without complications: Secondary | ICD-10-CM

## 2022-05-30 MED ORDER — SITAGLIPTIN PHOSPHATE 100 MG PO TABS: 100.0000 mg | ORAL_TABLET | Freq: Every day | ORAL | 1 refills | Status: DC

## 2022-05-30 NOTE — Telephone Encounter (Signed)
Januvia updated to '100mg'$    RX sent to pharmacy for 90 day supply   [name], MD  [facility]

## 2022-05-30 NOTE — Telephone Encounter (Signed)
Januvia '100mg'$  or 10 mg  In your note it says  continue [place] '10mg'$  daily

## 2022-05-30 NOTE — Telephone Encounter (Signed)
[facility] faxed refill request for the following medications:    JANUVIA 100 MG tablet    Please advise

## 2022-05-30 NOTE — Telephone Encounter (Signed)
Pt called reporting that the pharmacy told her to contact the office. She says she has about three or four days left. Please advise

## 2022-06-13 ENCOUNTER — Encounter: Payer: Self-pay | Admitting: Podiatry

## 2022-06-13 ENCOUNTER — Ambulatory Visit: Payer: Medicare PPO | Admitting: Podiatry

## 2022-06-13 DIAGNOSIS — B351 Tinea unguium: Secondary | ICD-10-CM | POA: Diagnosis not present

## 2022-06-13 DIAGNOSIS — L97511 Non-pressure chronic ulcer of other part of right foot limited to breakdown of skin: Secondary | ICD-10-CM | POA: Diagnosis not present

## 2022-06-13 DIAGNOSIS — E1142 Type 2 diabetes mellitus with diabetic polyneuropathy: Secondary | ICD-10-CM

## 2022-06-13 DIAGNOSIS — M79676 Pain in unspecified toe(s): Secondary | ICD-10-CM

## 2022-06-13 DIAGNOSIS — L97521 Non-pressure chronic ulcer of other part of left foot limited to breakdown of skin: Secondary | ICD-10-CM

## 2022-06-13 NOTE — Progress Notes (Signed)
She presents today for follow-up of her ulcer she states that she is doing better.  She states she did not have it wrapped today.  Objective: Vital signs stable tellurian x 3 ulceration plantar aspect of the fifth metatarsal is gone on to heal uneventfully the hallux right does still demonstrate some reactive hyperkeratosis once removed does demonstrate some skin breakdown.  No purulence no malodor.  Assessment: Skin breakdown superficial ulceration diabetic neuropathy.  Plan: Debridement of wound today this should go on to heal uneventfully I redressed it placed antibiotic ointment on it she will continue to do so.

## 2022-06-20 ENCOUNTER — Ambulatory Visit: Payer: Self-pay | Admitting: *Deleted

## 2022-06-20 NOTE — Patient Outreach (Signed)
  Care Coordination   Follow Up Visit Note   06/20/2022 Name: Karen Stephenson MRN: [id number] DOB: November 30, 1955  Karen Stephenson is a 66 y.o. year old female who sees Simmons-Robinson, Riki Sheer, MD for primary care. I spoke with  Gwendelyn B Netter by phone today.  What matters to the patients health and wellness today?  Still working on managing foot wound and DM.      Goals Addressed             This Visit's Progress    RNCM: Effective Management of DM   On track    Care Coordination Interventions:  Lab Results  Component Value Date   HGBA1C 7.4 (A) 04/11/2022    Provided education to patient about basic DM disease process. The patient is being treated by podiatry for a right ulcer of her right foot. The patient states it is healing well and the provider is happy with the progress of healing. The patient puts ointment on it and wraps as directed. Keeps area clean. Reviewed medications with patient and discussed importance of medication adherence Counseled on importance of regular laboratory monitoring as prescribed Discussed plans with patient for ongoing care management follow up and provided patient with direct contact information for care management team Provided patient with written educational materials related to hypo and hyperglycemia and importance of correct treatment Reviewed scheduled/upcoming provider appointments including: Foot center for wound care on 1/17 and AWV in April 2024.  Advised to call to schedule PCP follow up, last was in October, recommended for 3 month follow up Advised patient, providing education and rationale, to check cbg as directed and record, calling pcp for findings outside established parameters. The patient admits she does not always check her blood sugars as she should. Education on the goal of therapy <130 fasting and <180 post prandial. Encouraged the patient to take her blood sugars more regularly and writing down the numbers to take to the pcp  upcoming visit.  State it has not been as well managed as there has been much going on with other family members.  She will continue to manage Review of patient status, including review of consultants reports, relevant laboratory and other test results, and medications completed The patient agrees to regular outreaches from the [facility] Review of the care coordination program and the patient knows how to reach the [facility].           SDOH assessments and interventions completed:  No     Care Coordination Interventions:  Yes, provided   Follow up plan: Follow up call scheduled for 1/17    Encounter Outcome:  Pt. Visit Completed   Valente David, RN, MSN, [place] Care Management Care Management Coordinator [phone]

## 2022-06-20 NOTE — Patient Instructions (Signed)
Visit Information  Thank you for taking time to visit with me today. Please don't hesitate to contact me if I can be of assistance to you before our next scheduled telephone appointment.  Following are the goals we discussed today:  Continue checking blood sugars daily. Continue wound care for foot wound. Call PCP office to schedule 3 month follow up.  Our next appointment is by telephone on 1/17  Please call the care guide team at [phone] if you need to cancel or reschedule your appointment.   Please call the Suicide and Crisis Lifeline: 988 call the [place] National Suicide Prevention Lifeline: 6840136772 or TTY: 325-743-9067 TTY 913-857-3837) to talk to a trained counselor call 1-800-273-TALK (toll free, 24 hour hotline) call 911 if you are experiencing a Mental Health or [place] or need someone to talk to.  Patient verbalizes understanding of instructions and care plan provided today and agrees to view in [place]. Active MyChart status and patient understanding of how to access instructions and care plan via MyChart confirmed with patient.     The patient has been provided with contact information for the care management team and has been advised to call with any health related questions or concerns.   Valente David, RN, MSN, [place] Care Management Care Management Coordinator [phone]

## 2022-07-06 ENCOUNTER — Telehealth: Payer: Self-pay | Admitting: Family Medicine

## 2022-07-06 DIAGNOSIS — E7800 Pure hypercholesterolemia, unspecified: Secondary | ICD-10-CM

## 2022-07-06 NOTE — Telephone Encounter (Signed)
Last OV : 04/11/22   Next OV:  10/10/22 Last refill: 05/09/2021

## 2022-07-06 NOTE — Telephone Encounter (Signed)
[facility] faxed refill request for the following medications:   rosuvastatin (CRESTOR) 20 MG tablet     Please advise

## 2022-07-07 ENCOUNTER — Encounter: Payer: Self-pay | Admitting: Family Medicine

## 2022-07-09 ENCOUNTER — Other Ambulatory Visit: Payer: Self-pay | Admitting: Family Medicine

## 2022-07-09 DIAGNOSIS — J302 Other seasonal allergic rhinitis: Secondary | ICD-10-CM

## 2022-07-09 DIAGNOSIS — I10 Essential (primary) hypertension: Secondary | ICD-10-CM

## 2022-07-09 DIAGNOSIS — E7800 Pure hypercholesterolemia, unspecified: Secondary | ICD-10-CM

## 2022-07-09 MED ORDER — ROSUVASTATIN CALCIUM 20 MG PO TABS: 20.0000 mg | ORAL_TABLET | Freq: Every day | ORAL | 1 refills | Status: DC

## 2022-07-09 NOTE — Telephone Encounter (Signed)
Medication Refill - Medication: montelukast (SINGULAIR) 10 MG tablet ,rosuvastatin (CRESTOR) 20 MG tablet  hydrochlorothiazide (HYDRODIURIL) 25 MG tablet ,  losartan (COZAAR) 100 MG tablet   Has the patient contacted their pharmacy? Yes.   Last filled by Dr Rosanna Randy Preferred Pharmacy (with phone number or street name): [facility], [place]  Has the patient been seen for an appointment in the last year OR does the patient have an upcoming appointment? Yes.    Agent: Please be advised that RX refills may take up to 3 business days. We ask that you follow-up with your pharmacy.

## 2022-07-10 MED ORDER — MONTELUKAST SODIUM 10 MG PO TABS: 10.0000 mg | ORAL_TABLET | Freq: Every day | ORAL | 3 refills | Status: DC

## 2022-07-10 MED ORDER — HYDROCHLOROTHIAZIDE 25 MG PO TABS: 25.0000 mg | ORAL_TABLET | Freq: Every day | ORAL | 1 refills | Status: DC

## 2022-07-10 MED ORDER — LOSARTAN POTASSIUM 100 MG PO TABS: 100.0000 mg | ORAL_TABLET | Freq: Every day | ORAL | 1 refills | Status: DC

## 2022-07-18 ENCOUNTER — Ambulatory Visit: Payer: Self-pay | Admitting: *Deleted

## 2022-07-18 ENCOUNTER — Ambulatory Visit: Payer: Medicare PPO | Admitting: Podiatry

## 2022-07-18 ENCOUNTER — Encounter: Payer: Self-pay | Admitting: Podiatry

## 2022-07-18 VITALS — BP 144/83 | HR 88

## 2022-07-18 DIAGNOSIS — E1142 Type 2 diabetes mellitus with diabetic polyneuropathy: Secondary | ICD-10-CM | POA: Diagnosis not present

## 2022-07-18 DIAGNOSIS — M79676 Pain in unspecified toe(s): Secondary | ICD-10-CM

## 2022-07-18 DIAGNOSIS — L84 Corns and callosities: Secondary | ICD-10-CM

## 2022-07-18 DIAGNOSIS — B351 Tinea unguium: Secondary | ICD-10-CM

## 2022-07-18 DIAGNOSIS — L97511 Non-pressure chronic ulcer of other part of right foot limited to breakdown of skin: Secondary | ICD-10-CM

## 2022-07-18 NOTE — Progress Notes (Signed)
She presents today for follow-up of her wound to her right great toe and would like [place] to trim her nails.  Objective: Vital signs are stable alert oriented x 3 she has a thick yellow dystrophic onychomycotic nail that appears to have rubbed and resulted and skin breakdown and hyperkeratosis along the medial aspect of the hallux interphalangeal joint right foot and subfifth metatarsal phalangeal joint right foot.  No open lesions or wounds are noted.  Assessment: Debridement of all this reactive hyperkeratotic tissue and ulceration has resulted in new healthy looking skin plantarly no open wounds are draining.  No purulence no malodor.  Her mycotic nail was debrided today and smoothed thoroughly.  I also debrided all of the remaining nails.  Plan: Debrided all reactive hyperkeratotic tissue debrided ulcerative area debrided nails 1 through 5 bilateral placed padding follow-up with her in about 3 weeks.

## 2022-07-18 NOTE — Patient Outreach (Signed)
  Care Coordination   07/18/2022 Name: Karen Stephenson MRN: [id number] DOB: 04/22/1956   Care Coordination Outreach Attempts:  An unsuccessful telephone outreach was attempted for a scheduled appointment today.  Follow Up Plan:  Additional outreach attempts will be made to offer the patient care coordination information and services.   Encounter Outcome:  No Answer   Care Coordination Interventions:  No, not indicated    Valente David, RN, MSN, [facility] Care Management Care Management Coordinator [phone]

## 2022-08-01 ENCOUNTER — Ambulatory Visit: Payer: Self-pay | Admitting: *Deleted

## 2022-08-01 ENCOUNTER — Telehealth: Payer: Self-pay | Admitting: Family Medicine

## 2022-08-01 DIAGNOSIS — E1142 Type 2 diabetes mellitus with diabetic polyneuropathy: Secondary | ICD-10-CM

## 2022-08-01 MED ORDER — FLUCONAZOLE 150 MG PO TABS: 150.0000 mg | ORAL_TABLET | Freq: Every day | ORAL | 0 refills | Status: AC

## 2022-08-01 MED ORDER — EMPAGLIFLOZIN 10 MG PO TABS: 10.0000 mg | ORAL_TABLET | Freq: Every day | ORAL | 1 refills | Status: DC

## 2022-08-01 NOTE — Patient Instructions (Signed)
Visit Information  Thank you for taking time to visit with me today. Please don't hesitate to contact me if I can be of assistance to you before our next scheduled telephone appointment.  Following are the goals we discussed today:  Continue monitoring daily blood sugars. Contact PCP office to report UTI and yeast symptoms, and to schedule 3 month follow up.  Our next appointment is by telephone on 2/16  Please call the care guide team at [phone] if you need to cancel or reschedule your appointment.   Please call the Suicide and Crisis Lifeline: 988 call the [place] National Suicide Prevention Lifeline: 575-815-8798 or TTY: 623-778-8252 TTY 940-122-4038) to talk to a trained counselor call 1-800-273-TALK (toll free, 24 hour hotline) call 911 if you are experiencing a Mental Health or [place] or need someone to talk to.  Patient verbalizes understanding of instructions and care plan provided today and agrees to view in [place]. Active MyChart status and patient understanding of how to access instructions and care plan via MyChart confirmed with patient.     The patient has been provided with contact information for the care management team and has been advised to call with any health related questions or concerns.   Valente David, RN, MSN, [place] Care Management Care Management Coordinator [phone]

## 2022-08-01 NOTE — Telephone Encounter (Signed)
Please have patient decrease jardiance to '10mg'$  daily due to yeast symptoms    Will send RX for diflucan for patient to take '150mg'$  today and repeat in 72 hours.   Please scheduled for follow up in the next 4 weeks    [name], MD  [facility]

## 2022-08-01 NOTE — Patient Outreach (Signed)
  Care Coordination   Follow Up Visit Note   08/01/2022 Name: Karen Stephenson MRN: [id number] DOB: [birth year]  Karen Stephenson is a 67 y.o. year old female who sees Simmons-Robinson, Karen Sheer, MD for primary care. I spoke with  Karen Stephenson by phone today.  What matters to the patients health and wellness today?  Patient complains of UTI and yeast infection symptoms.  State this is been since her increase in Karen Stephenson.  Would like to know if this can be changed.    Goals Addressed             This Visit's Progress    RNCM: Effective Management of DM   On track    Care Coordination Interventions:  Lab Results  Component Value Date   HGBA1C 7.4 (A) 04/11/2022    Provided education to patient about basic DM disease process. The patient is being treated by podiatry for a right ulcer of her right foot. The patient states it is healing well and the provider is happy with the progress of healing. The patient is being seen at podiatry for wound healing, no longer wearing a foot boot.   Reviewed medications with patient and discussed importance of medication adherence Counseled on importance of regular laboratory monitoring as prescribed Discussed plans with patient for ongoing care management follow up and provided patient with direct contact information for care management team Provided patient with written educational materials related to hypo and hyperglycemia and importance of correct treatment Reviewed scheduled/upcoming provider appointments including: Foot center for wound care on 2/7, cardiology on 3/22,  and AWV in April 2024.  Advised to call to schedule PCP follow up, last was in October, recommended for 3 month follow up Advised patient, providing education and rationale, to check cbg as directed and record, calling pcp for findings outside established parameters. The patient admits she does not always check her blood sugars as she should. Education on the goal of therapy <130 fasting  and <180 post prandial. Encouraged the patient to take her blood sugars more regularly and writing down the numbers to take to the pcp upcoming visit.   Review of patient status, including review of consultants reports, relevant laboratory and other test results, and medications completed The patient agrees to regular outreaches from the [facility]. Review of the care coordination program and the patient knows how to reach the [facility].           SDOH assessments and interventions completed:  No     Care Coordination Interventions:  Yes, provided   Follow up plan: Follow up call scheduled for 2/16    Encounter Outcome:  Pt. Visit Completed   Valente David, RN, MSN, [place] Care Management Care Management Coordinator [phone]

## 2022-08-02 ENCOUNTER — Ambulatory Visit: Payer: Self-pay | Admitting: *Deleted

## 2022-08-02 NOTE — Telephone Encounter (Signed)
Patient advised.

## 2022-08-02 NOTE — Telephone Encounter (Signed)
  Chief Complaint: Missing medication - UTI s/s Symptoms:  Frequency:  Pertinent Negatives: Patient denies  Disposition: '[]'$ ED /'[]'$ Urgent Care (no appt availability in office) / '[x]'$ Appointment(In office/virtual)/ '[]'$  [place] [facility]/ '[]'$ Home Care/ '[]'$ Refused Recommended Disposition /'[]'$ [place] [facility]/ '[]'$  Follow-up with PCP Additional Notes: Pt thought that provider was going to call in UTI medication as pt is having those s/s.  PT would like that called in this evening if possible.   Appt made for tomorrow morning for UTI testing.

## 2022-08-02 NOTE — Telephone Encounter (Signed)
Summary: med ?   Patient called in states messaged Dr Sharyon Cable in [place] and was told she would be prescribed antibiotic called cefdinir 300 mg along with flucanasole that was prescribed, but the cefdinir 300 mg is not showing up at all.       Reason for Disposition  Urinating more frequently than usual (i.e., frequency)  Answer Assessment - Initial Assessment Questions 1. SYMPTOM: "What's the main symptom you're concerned about?" (e.g., frequency, incontinence)     UTI 2. ONSET: "When did the    start?"      3. PAIN: "Is there any pain?" If Yes, ask: "How bad is it?" (Scale: 1-10; mild, moderate, severe)      4. CAUSE: "What do you think is causing the symptoms?"     UTI 5. OTHER SYMPTOMS: "Do you have any other symptoms?" (e.g., blood in urine, fever, flank pain, pain with urination)      6. PREGNANCY: "Is there any chance you are pregnant?" "When was your last menstrual period?"  Protocols used: Urinary Symptoms-A-AH

## 2022-08-02 NOTE — Telephone Encounter (Signed)
Attempted to reach pt, left VM to call back. 

## 2022-08-03 ENCOUNTER — Telehealth: Payer: Self-pay

## 2022-08-03 ENCOUNTER — Ambulatory Visit: Payer: Medicare PPO | Admitting: Physician Assistant

## 2022-08-03 MED ORDER — CEFDINIR 300 MG PO CAPS: 300.0000 mg | ORAL_CAPSULE | Freq: Two times a day (BID) | ORAL | 0 refills | Status: AC

## 2022-08-03 NOTE — Telephone Encounter (Signed)
Patient need a follow-up with Dr. Quentin Cornwall in the next 4 weeks

## 2022-08-03 NOTE — Telephone Encounter (Signed)
Pt no showed her appt

## 2022-08-03 NOTE — Progress Notes (Deleted)
I,Sha'taria Arbie Reisz,acting as a Education administrator for [employer], PA-C.,have documented all relevant documentation on the behalf of Mikey Kirschner, PA-C,as directed by  Mikey Kirschner, PA-C while in the presence of Mikey Kirschner, PA-C.   Established patient visit   Patient: Karen Stephenson   DOB: [birth year]   67 y.o. Female  MRN: [id number] Visit Date: 08/03/2022  Today's healthcare provider: Mikey Kirschner, PA-C   No chief complaint on file.  Subjective    Urinary Tract Infection    ***  Medications: Outpatient Medications Prior to Visit  Medication Sig  . acarbose (PRECOSE) 25 MG tablet Take 1 tablet (25 mg total) by mouth 3 (three) times daily with meals.  Marland Kitchen albuterol (VENTOLIN HFA) 108 (90 Base) MCG/ACT inhaler Inhale 2 puffs into the lungs every 6 (six) hours as needed for wheezing or shortness of breath.  Marland Kitchen aspirin EC 81 MG tablet Take 81 mg by mouth daily. Swallow whole.  . cetirizine (ZYRTEC) 10 MG tablet Take 10 mg by mouth daily.  . clopidogrel (PLAVIX) 75 MG tablet Take 1 tablet (75 mg total) by mouth daily.  . empagliflozin (JARDIANCE) 10 MG TABS tablet Take 1 tablet (10 mg total) by mouth daily before breakfast.  . fluconazole (DIFLUCAN) 150 MG tablet Take 1 tablet (150 mg total) by mouth daily for 2 days.  . fluticasone (FLONASE) 50 MCG/ACT nasal spray Use 2 spray(s) in each nostril once daily  . fluticasone-salmeterol (ADVAIR DISKUS) 250-50 MCG/ACT AEPB Inhale 1 puff into the lungs in the morning and at bedtime.  Marland Kitchen glucose blood test strip Check sugar once daily, E 11.9  . Homeopathic Products (LEG CRAMP RELIEF PO) Take 2 capsules by mouth daily.   . hydrochlorothiazide (HYDRODIURIL) 25 MG tablet Take 1 tablet (25 mg total) by mouth daily.  . hyoscyamine (LEVBID) 0.375 MG 12 hr tablet Take 1 tablet (0.375 mg total) by mouth as needed. (Patient taking differently: Take 0.375 mg by mouth every 12 (twelve) hours as needed for cramping.)  . Lancets (ONETOUCH ULTRASOFT)  lancets Check sugar once daily ,DX E11.9  . losartan (COZAAR) 100 MG tablet Take 1 tablet (100 mg total) by mouth daily.  . montelukast (SINGULAIR) 10 MG tablet Take 1 tablet (10 mg total) by mouth at bedtime.  . Multiple Vitamins-Minerals (MULTIVITAMIN WITH MINERALS) tablet Take 1 tablet by mouth daily.  . mupirocin ointment (BACTROBAN) 2 % Apply 1 Application topically 2 (two) times daily.  . pantoprazole (PROTONIX) 40 MG tablet Take 1 tablet by mouth once daily  . rosuvastatin (CRESTOR) 20 MG tablet Take 1 tablet (20 mg total) by mouth daily.  . sitaGLIPtin (JANUVIA) 100 MG tablet Take 1 tablet (100 mg total) by mouth daily.  . vitamin B-12 (CYANOCOBALAMIN) 1000 MCG tablet Take 1,000 mcg by mouth daily.  . Vitamin D, Cholecalciferol, 50 MCG (2000 UT) CAPS Take 2,000 Units by mouth daily.  Marland Kitchen zinc gluconate 50 MG tablet Take 50 mg by mouth daily.   No facility-administered medications prior to visit.    Review of Systems  {Labs  Heme  Chem  Endocrine  Serology  Results Review (optional):23779}   Objective    There were no vitals taken for this visit. {Show previous vital signs (optional):23777}  Physical Exam  ***  No results found for any visits on 08/03/22.  Assessment & Plan     ***  No follow-ups on file.      {provider attestation***:1}   Mikey Kirschner, PA-C  [facility] [phone] (phone) [phone] (fax)  [place]

## 2022-08-03 NOTE — Telephone Encounter (Signed)
Copied from [place] [id number]. Topic: Appointment Scheduling - Scheduling Inquiry for Clinic >> Aug 02, 2022  4:42 PM Karen Stephenson wrote: Reason for CRM: Patient called in states was told to reschedule and appt , and she missed one in [place], not sure if this is for care coordination or something else. Please call back

## 2022-08-03 NOTE — Telephone Encounter (Signed)
FYI-she has an appointment with you today for possible UTI

## 2022-08-03 NOTE — Telephone Encounter (Signed)
Pt already scheduled appt

## 2022-08-06 ENCOUNTER — Ambulatory Visit: Payer: Medicare PPO | Admitting: Family Medicine

## 2022-08-08 ENCOUNTER — Ambulatory Visit: Payer: Medicare PPO | Admitting: Podiatry

## 2022-08-08 ENCOUNTER — Encounter: Payer: Self-pay | Admitting: Podiatry

## 2022-08-08 VITALS — BP 113/73 | HR 75

## 2022-08-08 DIAGNOSIS — L97511 Non-pressure chronic ulcer of other part of right foot limited to breakdown of skin: Secondary | ICD-10-CM

## 2022-08-08 DIAGNOSIS — L97521 Non-pressure chronic ulcer of other part of left foot limited to breakdown of skin: Secondary | ICD-10-CM

## 2022-08-08 NOTE — Progress Notes (Signed)
She presents today for follow-up of her ulcerations bilaterally she states that they are doing much better since she has not been walking as much and keeping her feet up a lot more.  Objective: Vital signs are stable alert oriented x 3 there are no open lesions at this point.  Even after thorough debridement today down to normal skin there is still no bleeding and no signs of infection.  No purulence no malodor.  Assessment: Debrided preulcerative lesions and resolving ulcerations.  Plan: Debrided these areas today we will follow-up with her in about 3 weeks for reevaluation.

## 2022-08-17 ENCOUNTER — Ambulatory Visit: Payer: Self-pay | Admitting: *Deleted

## 2022-08-17 NOTE — Patient Instructions (Signed)
Visit Information  Thank you for taking time to visit with me today. Please don't hesitate to contact me if I can be of assistance to you before our next scheduled telephone appointment.  Following are the goals we discussed today:  Monitor blood sugars at least 2-3 times a week.  Our next appointment is by telephone on 3/26  Please call the care guide team at [phone] if you need to cancel or reschedule your appointment.   Please call the Suicide and Crisis Lifeline: 988 call the [place] National Suicide Prevention Lifeline: 724-663-9122 or TTY: 579-010-3105 TTY 515-281-2985) to talk to a trained counselor call 1-800-273-TALK (toll free, 24 hour hotline) call 911 if you are experiencing a Mental Health or [place] or need someone to talk to.  Patient verbalizes understanding of instructions and care plan provided today and agrees to view in [place]. Active MyChart status and patient understanding of how to access instructions and care plan via MyChart confirmed with patient.     The patient has been provided with contact information for the care management team and has been advised to call with any health related questions or concerns.   Valente David, RN, MSN, [place] Care Management Care Management Coordinator [phone]

## 2022-08-17 NOTE — Patient Outreach (Signed)
  Care Coordination   Follow Up Visit Note   08/17/2022 Name: Karen Stephenson MRN: [id number] DOB: 21-Apr-1956  Karen Stephenson is a 67 y.o. year old female who sees Simmons-Robinson, Riki Sheer, MD for primary care. I spoke with  Karen Stephenson by phone today.  What matters to the patients health and wellness today?  Keep foot wound healed and blood sugars under control.    Goals Addressed             This Visit's Progress    RNCM: Effective Management of DM   On track    Care Coordination Interventions:  Lab Results  Component Value Date   HGBA1C 7.4 (A) 04/11/2022    Provided education to patient about basic DM disease process. The patient is being treated by podiatry for a right ulcer of her right foot, state this is now healed. Reviewed medications with patient and discussed importance of medication adherence Counseled on importance of regular laboratory monitoring as prescribed Discussed plans with patient for ongoing care management follow up and provided patient with direct contact information for care management team Provided patient with written educational materials related to hypo and hyperglycemia and importance of correct treatment Reviewed scheduled/upcoming provider appointments including: Foot center and PCP on 2/28, cardiology on 3/22,  and AWV in April 2024.   Advised patient, providing education and rationale, to check cbg as directed and record, calling pcp for findings outside established parameters. The patient again admits she does not always check her blood sugars as she should. Education on the goal of therapy <130 fasting and <180 post prandial. Encouraged the patient to take her blood sugars at least 2-3 times a week and writing down the numbers to take to the pcp upcoming visit.   Review of patient status, including review of consultants reports, relevant laboratory and other test results, and medications completed The patient agrees to regular outreaches from  the [facility]. Review of the care coordination program and the patient knows how to reach the [facility]. State UTI has cleared with course of antibiotics           SDOH assessments and interventions completed:  No     Care Coordination Interventions:  Yes, provided   Follow up plan: Follow up call scheduled for 3/26    Encounter Outcome:  Pt. Visit Completed   Valente David, RN, MSN, [place] Care Management Care Management Coordinator [phone]

## 2022-08-28 ENCOUNTER — Other Ambulatory Visit: Payer: Self-pay | Admitting: Cardiovascular Disease

## 2022-08-28 NOTE — Progress Notes (Unsigned)
I,Sha'taria Tyson,acting as a Education administrator for [employer], MD.,have documented all relevant documentation on the behalf of [name], MD,as directed by  [name], MD while in the presence of [name], MD.   Established patient visit   Patient: Karen Stephenson   DOB: Feb 08, 1956   67 y.o. Female  MRN: [id number] Visit Date: 08/29/2022  Today's healthcare provider: [name], MD   No chief complaint on file.  Subjective    HPI    Diabetes Mellitus Type II, Follow-up  Lab Results  Component Value Date   HGBA1C 7.3 (A) 08/29/2022   HGBA1C 7.4 (A) 04/11/2022   HGBA1C 7.8 (H) 11/22/2021   Wt Readings from Last 3 Encounters:  08/29/22 254 lb 9.6 oz (115.5 kg)  04/11/22 258 lb (117 kg)  11/22/21 258 lb (117 kg)   Last seen for diabetes 4 months ago.  Management since then includes increase jardiance from '10mg'$  to '25mg'$  daily  She will continue [place] '100mg'$  daily and acarbose '25mg'$  daily  Recommended checking fasting BG as well as PP 1-2 hours after largest meal of the day.  She reports excellent compliance with treatment. She is not having side effects. Symptoms: No fatigue No foot ulcerations  No appetite changes No nausea  No paresthesia of the feet  No polydipsia  No polyuria No visual disturbances   No vomiting     Home blood sugar records:  checked on occasions  Episodes of hypoglycemia? Yes jittery and normally noticed Sunday mornings after the does not have breakfast and is better if she has some candy   Current insulin regiment: none  Most Recent Eye Exam: needs to have updated eye exam, reports that has been balancing multiple appointments for both her and her husband after his surgery   Pertinent Labs: Lab Results  Component Value Date   CHOL 117 11/22/2021   HDL 36 (L) 11/22/2021   LDLCALC 58 11/22/2021   TRIG 127 11/22/2021   CHOLHDL 3.3 11/22/2021   Lab Results  Component Value  Date   NA 145 (H) 08/23/2021   K 3.8 08/23/2021   CREATININE 0.92 08/23/2021   EGFR 68 08/23/2021   LABMICR <3.0 04/11/2022   MICRALBCREAT <6 04/11/2022      HTN: she reports adherence to HCTZ '25mg'$  and losartan '100mg'$ . She has had medications today. She denies HA or blurred vision at this time. States that she had pork last night for dinner.   HM: discussed recommendations for shingles and RSV vaccines, patient plans to have them at the pharmacy, patient encouraged to schedule DEXA scan   ---------------------------------------------------------------------------------------------------  Medications: Outpatient Medications Prior to Visit  Medication Sig   acarbose (PRECOSE) 25 MG tablet Take 1 tablet (25 mg total) by mouth 3 (three) times daily with meals.   albuterol (VENTOLIN HFA) 108 (90 Base) MCG/ACT inhaler Inhale 2 puffs into the lungs every 6 (six) hours as needed for wheezing or shortness of breath.   aspirin EC 81 MG tablet Take 81 mg by mouth daily. Swallow whole.   cetirizine (ZYRTEC) 10 MG tablet Take 10 mg by mouth daily.   clopidogrel (PLAVIX) 75 MG tablet Take 1 tablet by mouth once daily   empagliflozin (JARDIANCE) 10 MG TABS tablet Take 1 tablet (10 mg total) by mouth daily before breakfast.   fluticasone (FLONASE) 50 MCG/ACT nasal spray Use 2 spray(s) in each nostril once daily   fluticasone-salmeterol (ADVAIR DISKUS) 250-50 MCG/ACT AEPB Inhale 1 puff into the lungs in the  morning and at bedtime.   glucose blood test strip Check sugar once daily, E 11.9   Homeopathic Products (LEG CRAMP RELIEF PO) Take 2 capsules by mouth daily.    hydrochlorothiazide (HYDRODIURIL) 25 MG tablet Take 1 tablet (25 mg total) by mouth daily.   hyoscyamine (LEVBID) 0.375 MG 12 hr tablet Take 1 tablet (0.375 mg total) by mouth as needed. (Patient taking differently: Take 0.375 mg by mouth every 12 (twelve) hours as needed for cramping.)   Lancets (ONETOUCH ULTRASOFT) lancets Check sugar once  daily ,DX E11.9   losartan (COZAAR) 100 MG tablet Take 1 tablet (100 mg total) by mouth daily.   montelukast (SINGULAIR) 10 MG tablet Take 1 tablet (10 mg total) by mouth at bedtime.   Multiple Vitamins-Minerals (MULTIVITAMIN WITH MINERALS) tablet Take 1 tablet by mouth daily.   mupirocin ointment (BACTROBAN) 2 % Apply 1 Application topically 2 (two) times daily.   pantoprazole (PROTONIX) 40 MG tablet Take 1 tablet by mouth once daily   rosuvastatin (CRESTOR) 20 MG tablet Take 1 tablet (20 mg total) by mouth daily.   sitaGLIPtin (JANUVIA) 100 MG tablet Take 1 tablet (100 mg total) by mouth daily.   vitamin B-12 (CYANOCOBALAMIN) 1000 MCG tablet Take 1,000 mcg by mouth daily.   Vitamin D, Cholecalciferol, 50 MCG (2000 UT) CAPS Take 2,000 Units by mouth daily.   zinc gluconate 50 MG tablet Take 50 mg by mouth daily.   No facility-administered medications prior to visit.    Review of Systems     Objective    BP (!) 141/83 (BP Location: Left Arm, Patient Position: Sitting)   Pulse (!) 58   Wt 254 lb 9.6 oz (115.5 kg)   BMI 38.71 kg/m    Physical Exam Vitals reviewed.  Constitutional:      General: She is not in acute distress.    Appearance: Normal appearance. She is not ill-appearing, toxic-appearing or diaphoretic.  Eyes:     Conjunctiva/sclera: Conjunctivae normal.  Cardiovascular:     Rate and Rhythm: Normal rate and regular rhythm.     Pulses: Normal pulses.     Heart sounds: Normal heart sounds. No murmur heard.    No friction rub. No gallop.  Pulmonary:     Effort: Pulmonary effort is normal. No respiratory distress.     Breath sounds: Normal breath sounds. No stridor. No wheezing, rhonchi or rales.  Abdominal:     General: Bowel sounds are normal. There is no distension.     Palpations: Abdomen is soft.     Tenderness: There is no abdominal tenderness.  Musculoskeletal:     Right lower leg: No edema.     Left lower leg: No edema.  Skin:    Findings: No erythema or  rash.  Neurological:     Mental Status: She is alert and oriented to person, place, and time.       Results for orders placed or performed in visit on 08/29/22  POCT HgB A1C  Result Value Ref Range   Hemoglobin A1C 7.3 (A) 4.0 - 5.6 %   HbA1c POC (<> result, manual entry)     HbA1c, POC (prediabetic range)     HbA1c, POC (controlled diabetic range)      Assessment & Plan     Problem List Items Addressed This Visit       Cardiovascular and Mediastinum   Essential (primary) hypertension    Chronic  BP elevated above goal today  Recommended she monitor BP  2-3 times per week until follow up in 3 months  She will continue losartan '100mg'$   and HCTZ '25mg'$  daily  Will check BMP today for creatinine         Endocrine   Diabetes mellitus, type 2 (HCC) - Primary    Chronic  A1c repeated today  She will continue current medications  No changes today  Recommended she schedule DM eye exam  F/u in 3 months      Relevant Orders   POCT HgB A1C (Completed)   Urine Microalbumin w/creat. ratio   Basic Metabolic Panel (BMET)     Other   Healthcare maintenance    Recommended DM eye exam  Recommended Shingrix and RSV vaccines at the pharmacy  Counseled patient on importance of these vaccines and potential side effects  Patient will schedule DEXA scan         Return in about 3 months (around 11/27/2022) for DM .        The entirety of the information documented in the History of Present Illness, Review of Systems and Physical Exam were personally obtained by me. Portions of this information were initially documented by Beverlee Nims. I, [name], MD have reviewed the documentation above for thoroughness and accuracy.      [name], MD  [facility] [phone] (phone) [phone] (fax)  [place]

## 2022-08-29 ENCOUNTER — Ambulatory Visit: Payer: Medicare PPO | Admitting: Podiatry

## 2022-08-29 ENCOUNTER — Ambulatory Visit: Payer: Medicare PPO | Admitting: Family Medicine

## 2022-08-29 ENCOUNTER — Encounter: Payer: Self-pay | Admitting: Podiatry

## 2022-08-29 ENCOUNTER — Encounter: Payer: Self-pay | Admitting: Family Medicine

## 2022-08-29 VITALS — BP 141/83 | HR 58 | Wt 254.6 lb

## 2022-08-29 VITALS — BP 162/85 | HR 62

## 2022-08-29 DIAGNOSIS — Z0000 Encounter for general adult medical examination without abnormal findings: Secondary | ICD-10-CM | POA: Diagnosis not present

## 2022-08-29 DIAGNOSIS — M79676 Pain in unspecified toe(s): Secondary | ICD-10-CM | POA: Diagnosis not present

## 2022-08-29 DIAGNOSIS — E1142 Type 2 diabetes mellitus with diabetic polyneuropathy: Secondary | ICD-10-CM | POA: Diagnosis not present

## 2022-08-29 DIAGNOSIS — B351 Tinea unguium: Secondary | ICD-10-CM

## 2022-08-29 DIAGNOSIS — R399 Unspecified symptoms and signs involving the genitourinary system: Secondary | ICD-10-CM

## 2022-08-29 DIAGNOSIS — I10 Essential (primary) hypertension: Secondary | ICD-10-CM

## 2022-08-29 DIAGNOSIS — L97511 Non-pressure chronic ulcer of other part of right foot limited to breakdown of skin: Secondary | ICD-10-CM | POA: Diagnosis not present

## 2022-08-29 DIAGNOSIS — L97521 Non-pressure chronic ulcer of other part of left foot limited to breakdown of skin: Secondary | ICD-10-CM | POA: Diagnosis not present

## 2022-08-29 LAB — POCT GLYCOSYLATED HEMOGLOBIN (HGB A1C): Hemoglobin A1C: 7.3 % — AB (ref 4.0–5.6)

## 2022-08-29 NOTE — Patient Instructions (Signed)
Please monitor your blood pressure 2-3 times per week and keep a record for your next appointment in 3 months   We will follow up on your diabetes and blood pressure at that time   No changes were made to your medications today  We will follow up with results of labs once they are available.

## 2022-08-29 NOTE — Assessment & Plan Note (Addendum)
Recommended DM eye exam  Recommended Shingrix and RSV vaccines at the pharmacy  Counseled patient on importance of these vaccines and potential side effects  Patient will schedule DEXA scan

## 2022-08-29 NOTE — Assessment & Plan Note (Signed)
Chronic  A1c repeated today  She will continue current medications  No changes today  Recommended she schedule DM eye exam  F/u in 3 months

## 2022-08-29 NOTE — Assessment & Plan Note (Signed)
Chronic  BP elevated above goal today  Recommended she monitor BP 2-3 times per week until follow up in 3 months  She will continue losartan '100mg'$   and HCTZ '25mg'$  daily  Will check BMP today for creatinine

## 2022-08-29 NOTE — Progress Notes (Signed)
She presents today chief complaint of ulcerations bilaterally.  She states that they seem to be doing much better.  Objective: Vital signs stable alert oriented x 3.  Pulses are palpable.  There is no erythema edema cellulitis drainage or odor ulcerative lesions well-known to close but do demonstrate some benign reactive skin.  Assessment: Debrided all hyperkeratotic tissue benign skin lesion.  Ulcerations have healed.  Plan: I follow-up with her in 1 month.

## 2022-08-30 LAB — BASIC METABOLIC PANEL
BUN/Creatinine Ratio: 14 (ref 12–28)
BUN: 13 mg/dL (ref 8–27)
CO2: 23 mmol/L (ref 20–29)
Calcium: 10 mg/dL (ref 8.7–10.3)
Chloride: 103 mmol/L (ref 96–106)
Creatinine, Ser: 0.94 mg/dL (ref 0.57–1.00)
Glucose: 186 mg/dL — ABNORMAL HIGH (ref 70–99)
Potassium: 3.9 mmol/L (ref 3.5–5.2)
Sodium: 141 mmol/L (ref 134–144)
eGFR: 66 mL/min/{1.73_m2} (ref >59)

## 2022-08-31 LAB — MICROALBUMIN / CREATININE URINE RATIO
Creatinine, Urine: 81.2 mg/dL
Microalb/Creat Ratio: <4 mg/g creat (ref 0–29)
Microalbumin, Urine: <3 ug/mL

## 2022-09-19 ENCOUNTER — Ambulatory Visit: Payer: Medicare PPO | Admitting: Podiatry

## 2022-09-19 DIAGNOSIS — E1142 Type 2 diabetes mellitus with diabetic polyneuropathy: Secondary | ICD-10-CM | POA: Diagnosis not present

## 2022-09-19 DIAGNOSIS — M79676 Pain in unspecified toe(s): Secondary | ICD-10-CM

## 2022-09-19 DIAGNOSIS — L97521 Non-pressure chronic ulcer of other part of left foot limited to breakdown of skin: Secondary | ICD-10-CM

## 2022-09-19 DIAGNOSIS — B351 Tinea unguium: Secondary | ICD-10-CM

## 2022-09-19 DIAGNOSIS — L97511 Non-pressure chronic ulcer of other part of right foot limited to breakdown of skin: Secondary | ICD-10-CM | POA: Diagnosis not present

## 2022-09-19 NOTE — Progress Notes (Signed)
She presents today for follow-up of her diabetic ulcerations and her painful elongated nails.  Objective: Vital signs stable alert oriented x 3.  Pulses are palpable bilateral.  Hallux right demonstrates significant thickening of the soft tissue around the toe and darkening of the toenail.  This appears to be a subungual hematoma most likely secondary to rubbing.  Currently her ulcerative lesions have healed but do demonstrate severe thick tissue.  Once this was debrided does not demonstrate any type of open wounds.  Assessment: Pain in limb secondary to diabetic peripheral neuropathy pain in limb secondary to painful elongated toenails and chronic diabetic ulcerations.  Plan: Discussed etiology pathology and surgical therapies at this point I am performed a nail debridement 1 through 5 bilaterally also debrided ulcerations and benign skin lesions.  Follow-up with her in 1 month.

## 2022-09-21 ENCOUNTER — Ambulatory Visit: Payer: Medicare PPO | Attending: Cardiovascular Disease | Admitting: Cardiovascular Disease

## 2022-09-21 ENCOUNTER — Encounter: Payer: Self-pay | Admitting: Cardiovascular Disease

## 2022-09-21 VITALS — BP 130/68 | HR 63 | Ht 68.0 in | Wt 252.5 lb

## 2022-09-21 DIAGNOSIS — I10 Essential (primary) hypertension: Secondary | ICD-10-CM | POA: Diagnosis not present

## 2022-09-21 DIAGNOSIS — I739 Peripheral vascular disease, unspecified: Secondary | ICD-10-CM

## 2022-09-21 DIAGNOSIS — E785 Hyperlipidemia, unspecified: Secondary | ICD-10-CM | POA: Diagnosis not present

## 2022-09-21 NOTE — Progress Notes (Signed)
Cardiology Office Note   Date:  09/21/2022   ID:  Karen Stephenson, DOB 30-Aug-1955, MRN [id number]  PCP:  Karen Foster, MD  Cardiologist:   Karen Sacramento, MD   Chief Complaint  Patient presents with   12 month follow up     "Doing well." Medications reviewed by the patient verbally.       History of Present Illness: Karen CASIO is a 67 y.Stephenson. female who is here today for a follow-up visit regarding peripheral arterial disease. She has known history of essential hypertension, hyperlipidemia, previous tobacco use, diabetes mellitus and sleep apnea.  She works at the Insurance underwriter at [employer]. She was seen by Dr. Garen Stephenson for preoperative cardiovascular evaluation before hysterectomy.  Your EKG was slightly abnormal and thus she underwent a Lexiscan Myoview which showed no evidence of ischemia.  Echocardiogram showed normal LV systolic function and no significant valvular abnormalities.  She has history of bilateral big toe ulcerations.  She underwent noninvasive vascular studies in 2013 which showed an ABI of 0.47 on the right and 0.82 on the left.  Duplex showed no significant infrainguinal disease on the right but she was noted to have monophasic waveforms starting at the external iliac artery.  Angiography was performed in March 2023 which showed heavily calcified short occlusion of the right common iliac artery with mild nonobstructive infrainguinal disease.  I performed successful balloon expandable covered stent placement to the right common iliac artery. On the left side, there was 60% stenosis in the popliteal artery and three-vessel runoff below the knee. Postprocedure Doppler studies showed an ABI of 0.93 bilaterally and duplex showed patent stents with normal velocities.  She has been doing well with no chest pain, shortness of breath or palpitations.  No lower extremity claudication.  She continues to follow with podiatry and  currently has no lower extremity ulceration.  Past Medical History:  Diagnosis Date   Anginal pain ([place])    a. 07/2020 MV: No ischemia/infarct-->low risk.   Asthma    Diabetes mellitus without complication ([place])    Family history of adverse reaction to anesthesia    SISTER HAS ISSUE DURING COLONOSCOPY   GERD (gastroesophageal reflux disease)    History of echocardiogram    a. 07/2020 Echo: EF 60-65%, no rwma, nl RV fxn, mild Ao sclerosis w/Stephenson stenosis.   Hypercholesteremia    Hypertension    Hypokalemia    PAD (peripheral artery disease) ([place])    a. 08/2021 LE Angio/PTA: Heavily calcified short occlusion of the R CIA (s/p balloon expandable covered stent), mild nonobstructive infrainguinal disease.  Mild left SFA disease.  Borderline significant L popliteal disease with three-vessel runoff below the knee.   Sleep apnea    WAS NOT GIVEN CPAP    Past Surgical History:  Procedure Laterality Date   ABDOMINAL AORTOGRAM W/LOWER EXTREMITY N/A 08/30/2021   Procedure: ABDOMINAL AORTOGRAM W/LOWER EXTREMITY;  Surgeon: Wellington Hampshire, MD;  Location: [facility];  Service: Cardiovascular;  Laterality: N/A;   BACK SURGERY  1986   COLONOSCOPY WITH PROPOFOL N/A 07/17/2017   Procedure: COLONOSCOPY WITH PROPOFOL;  Surgeon: Manya Silvas, MD;  Location[facility] END[facility];  Service: Endoscopy;  Laterality: N/A;   NASAL SINUS SURGERY     PERIPHERAL VASCULAR INTERVENTION Right 08/30/2021   Procedure: PERIPHERAL VASCULAR INTERVENTION;  Surgeon: Wellington Hampshire, MD;  Location: [facility];  Service: Cardiovascular;  Laterality: Right;  common Iliac   TOTAL LAPAROSCOPIC HYSTERECTOMY  WITH BILATERAL SALPINGO OOPHORECTOMY Bilateral 08/02/2020   Procedure: TOTAL LAPAROSCOPIC HYSTERECTOMY WITH BILATERAL SALPINGO OOPHORECTOMY;  Surgeon: Gae Dry, MD;  Location: [facility] ORS;  Service: Gynecology;  Laterality: Bilateral;  RN TO ASSIST   UTERINE FIBROID SURGERY       Current Outpatient Medications   Medication Sig Dispense Refill   acarbose (PRECOSE) 25 MG tablet Take 1 tablet (25 mg total) by mouth 3 (three) times daily with meals. 270 tablet 1   albuterol (VENTOLIN HFA) 108 (90 Base) MCG/ACT inhaler Inhale 2 puffs into the lungs every 6 (six) hours as needed for wheezing or shortness of breath. 8 g 2   aspirin EC 81 MG tablet Take 81 mg by mouth daily. Swallow whole.     cetirizine (ZYRTEC) 10 MG tablet Take 10 mg by mouth daily.     clopidogrel (PLAVIX) 75 MG tablet Take 1 tablet by mouth once daily 30 tablet 0   empagliflozin (JARDIANCE) 10 MG TABS tablet Take 1 tablet (10 mg total) by mouth daily before breakfast. 90 tablet 1   fluticasone (FLONASE) 50 MCG/ACT nasal spray Use 2 spray(s) in each nostril once daily 16 g 11   fluticasone-salmeterol (ADVAIR DISKUS) 250-50 MCG/ACT AEPB Inhale 1 puff into the lungs in the morning and at bedtime. 60 each 2   glucose blood test strip Check sugar once daily, E 11.9 25 each 12   Homeopathic Products (LEG CRAMP RELIEF PO) Take 2 capsules by mouth daily.      hydrochlorothiazide (HYDRODIURIL) 25 MG tablet Take 1 tablet (25 mg total) by mouth daily. 90 tablet 1   hyoscyamine (LEVBID) 0.375 MG 12 hr tablet Take 1 tablet (0.375 mg total) by mouth as needed. (Patient taking differently: Take 0.375 mg by mouth every 12 (twelve) hours as needed for cramping.) 60 tablet 5   Lancets (ONETOUCH ULTRASOFT) lancets Check sugar once daily ,DX E11.9 30 each 12   losartan (COZAAR) 100 MG tablet Take 1 tablet (100 mg total) by mouth daily. 90 tablet 1   montelukast (SINGULAIR) 10 MG tablet Take 1 tablet (10 mg total) by mouth at bedtime. 90 tablet 3   Multiple Vitamins-Minerals (MULTIVITAMIN WITH MINERALS) tablet Take 1 tablet by mouth daily.     mupirocin ointment (BACTROBAN) 2 % Apply 1 Application topically 2 (two) times daily. 22 g 0   pantoprazole (PROTONIX) 40 MG tablet Take 1 tablet by mouth once daily 90 tablet 3   rosuvastatin (CRESTOR) 20 MG tablet Take  1 tablet (20 mg total) by mouth daily. 90 tablet 1   sitaGLIPtin (JANUVIA) 100 MG tablet Take 1 tablet (100 mg total) by mouth daily. 90 tablet 1   vitamin B-12 (CYANOCOBALAMIN) 1000 MCG tablet Take 1,000 mcg by mouth daily.     Vitamin D, Cholecalciferol, 50 MCG (2000 UT) CAPS Take 2,000 Units by mouth daily.     zinc gluconate 50 MG tablet Take 50 mg by mouth daily.     No current facility-administered medications for this visit.    Allergies:   Amoxicillin-pot clavulanate, Aspirin, Ducodyl [bisacodyl], Erythromycin, Glipizide, Metformin hcl, Latex, and Macrobid [nitrofurantoin]    Social History:  The patient  reports that she quit smoking about 9 years ago. Her smoking use included cigarettes. She has never used smokeless tobacco. She reports that she does not drink alcohol and does not use drugs.   Family History:  The patient's family history includes Breast cancer (age of onset: 39) in her mother; Breast cancer (age of onset:  34) in her sister; Diabetes in her mother; Heart disease in her father and paternal grandfather; Hypertension in her brother.    ROS:  Please see the history of present illness.   Otherwise, review of systems are positive for none.   All other systems are reviewed and negative.    PHYSICAL EXAM: VS:  Ht 5\' 8"  (1.727 m)   Wt 252 lb 8 oz (114.5 kg)   BMI 38.39 kg/m  , BMI Body mass index is 38.39 kg/m. GEN: Well nourished, well developed, in no acute distress  HEENT: normal  Neck: no JVD, carotid bruits, or masses Cardiac: RRR; no murmurs, rubs, or gallops, trace leg edema  Respiratory:  clear to auscultation bilaterally, normal work of breathing GI: soft, nontender, nondistended, + BS MS: no deformity or atrophy  Skin: warm and dry, no rash Neuro:  Strength and sensation are intact Psych: euthymic mood, full affect Vascular: Radial pulses normal.   Dorsalis pedis is +2 on the right and +1 on the left.  Posterior tibial is not palpable.  EKG:  EKG is  ordered today. The ekg ordered today demonstrates sinus rhythm with moderate LVH.  Left axis deviation   Recent Labs: 11/22/2021: TSH 2.450 08/29/2022: BUN 13; Creatinine, Ser 0.94; Potassium 3.9; Sodium 141    Lipid Panel    Component Value Date/Time   CHOL 117 11/22/2021 1043   TRIG 127 11/22/2021 1043   HDL 36 (L) 11/22/2021 1043   CHOLHDL 3.3 11/22/2021 1043   LDLCALC 58 11/22/2021 1043      Wt Readings from Last 3 Encounters:  09/21/22 252 lb 8 oz (114.5 kg)  08/29/22 254 lb 9.6 oz (115.5 kg)  04/11/22 258 lb (117 kg)         07/25/2020   11:55 AM  PAD Screen  Previous PAD dx? No  Previous surgical procedure? No  Pain with walking? Yes  Subsides with rest? Yes  Feet/toe relief with dangling? Yes  Painful, non-healing ulcers? No  Extremities discolored? No      ASSESSMENT AND PLAN:  1.  Peripheral arterial disease: Status post covered stent placement to the right common iliac artery for total occlusion.  She is due for ABI and aortoiliac duplex which were requested. I elected to discontinue aspirin and we will leave her on long-term clopidogrel.  2.  Essential hypertension: Blood pressure is well controlled on current medications.  3.  Hyperlipidemia: I reviewed most recent lipid profile done in May which showed an LDL of 58.  Continue current dose of rosuvastatin 20 mg daily.  4.  Type 2 diabetes: Most recent hemoglobin A1c was 7.3.    Disposition:   Follow-up in 12 months.  Signed,  Karen Sacramento, MD  09/21/2022 8:32 AM    [facility]

## 2022-09-21 NOTE — Patient Instructions (Signed)
Medication Instructions:  STOP the Aspirin   *If you need a refill on your cardiac medications before your next appointment, please call your pharmacy*   Lab Work: None ordered If you have labs (blood work) drawn today and your tests are completely normal, you will receive your results only by: [facility] (if you have MyChart) OR A paper copy in the mail If you have any lab test that is abnormal or we need to change your treatment, we will call you to review the results.   Testing/Procedures: Your physician has recommended that you have an AORTA/ILIAC Duplex. This is a noninvasive diagnostic test that uses ultrasound technology to look at the aorta and iliac arteries to detect any restriction to blood flow to the buttocks, groin, legs or feet.  No food after 11PM the night before.  Water is OK. (Don't drink liquids if you have been instructed not to for ANOTHER test). Avoid foods that produce bowel gas, for 24 hours prior to exam (see below). No breakfast, no chewing gum, no smoking or carbonated beverages. Patient may take morning medications with water. Come in for test at least 15 minutes early to register. This will take approximately 60 minutes This will take place at [place] ([place]) [address], [place]  Your physician has requested that you have an ankle brachial index (ABI). During this test an ultrasound and blood pressure cuff are used to evaluate the arteries that supply the arms and legs with blood.  Allow thirty minutes for this exam.  There are no restrictions or special instructions.  This will take place at [place] ([place]) [address], [place]  Your physician has requested that you have a lower extremity arterial duplex. During this test, ultrasound is used to evaluate arterial blood flow in the legs. Allow one hour for this exam. There are no restrictions or special instructions. This will take place at  [place] ([place]) [address], [place]    Follow-Up: At [facility], you and your health needs are our priority.  As part of our continuing mission to provide you with exceptional heart care, we have created designated Provider Care Teams.  These Care Teams include your primary Cardiologist (physician) and Advanced Practice Providers (APPs -  Physician Assistants and Nurse Practitioners) who all work together to provide you with the care you need, when you need it.  We recommend signing up for the patient portal called "MyChart".  Sign up information is provided on this After Visit Summary.  MyChart is used to connect with patients for Virtual Visits (Telemedicine).  Patients are able to view lab/test results, encounter notes, upcoming appointments, etc.  Non-urgent messages can be sent to your provider as well.   To learn more about what you can do with MyChart, go to [email or web address].    Your next appointment:   12 month(s)  Provider:   You may see Dr. Fletcher Anon or one of the following Advanced Practice Providers on your designated Care Team:   Murray Hodgkins, NP Christell Faith, PA-C Cadence Kathlen Mody, PA-C Gerrie Nordmann, NP

## 2022-09-25 ENCOUNTER — Ambulatory Visit: Payer: Self-pay | Admitting: *Deleted

## 2022-09-25 NOTE — Patient Instructions (Signed)
Visit Information  Thank you for taking time to visit with me today. Please don't hesitate to contact me if I can be of assistance to you before our next scheduled telephone appointment.  Following are the goals we discussed today:  Monitor blood sugars daily. Maintain healthy diabetic diet.  Our next appointment is by telephone on 6/5  Please call the care guide team at [phone] if you need to cancel or reschedule your appointment.   Please call the Suicide and Crisis Lifeline: 988 call the [place] National Suicide Prevention Lifeline: 504-047-8261 or TTY: 657-622-8686 TTY 478 426 7688) to talk to a trained counselor call 1-800-273-TALK (toll free, 24 hour hotline) call 911 if you are experiencing a Mental Health or [place] or need someone to talk to.  Patient verbalizes understanding of instructions and care plan provided today and agrees to view in [place]. Active MyChart status and patient understanding of how to access instructions and care plan via MyChart confirmed with patient.     The patient has been provided with contact information for the care management team and has been advised to call with any health related questions or concerns.   Valente David, RN, MSN, [place] Care Management Care Management Coordinator [phone]

## 2022-09-25 NOTE — Patient Outreach (Signed)
  Care Coordination   Follow Up Visit Note   09/25/2022 Name: EMELENE HEPPLER MRN: [id number] DOB: 1956/03/28  Karen Stephenson is a 67 y.o. year old female who sees Simmons-Robinson, Riki Sheer, MD for primary care. I spoke with  Amillia B Sax by phone today.  What matters to the patients health and wellness today?  Monitoring blood sugars better and bringing A1C down to goal.  Was caring for husband more after he had an injury, lost focus of herself.  State he is better now and focus will change to her own health.      Goals Addressed             This Visit's Progress    RNCM: Effective Management of DM   On track    Care Coordination Interventions:  Lab Results  Component Value Date   HGBA1C 7.3 (A) 08/29/2022    Provided education to patient about basic DM disease process.  Reviewed medications with patient and discussed importance of medication adherence.  Was taken off Aspirin by cardiology, left on Plavix Counseled on importance of regular laboratory monitoring as prescribed Discussed plans with patient for ongoing care management follow up and provided patient with direct contact information for care management team Provided patient with written educational materials related to hypo and hyperglycemia and importance of correct treatment Reviewed scheduled/upcoming provider appointments including:  AWV 4/10, Podiatry 4/24, and vascular testing to follow up on stents on 5/3.   Advised patient, providing education and rationale, to check cbg as directed and record, calling pcp for findings outside established parameters. The patient again admits she does not always check her blood sugars as she should. Encouraged the patient again to take her blood sugars at least 2-3 times a week and writing down the numbers to take to the pcp upcoming visit.   Review of patient status, including review of consultants reports, relevant laboratory and other test results, and medications completed.  A1C  decreased from 7.4 to 7.3, aware of goal less than 7 The patient agrees to regular outreaches from the [facility]. Review of the care coordination program and the patient knows how to reach the [facility]. State BP was slightly elevated during last PCP visit, now within range at 110-120s/60-70s           SDOH assessments and interventions completed:  No     Care Coordination Interventions:  Yes, provided   Interventions Today    Flowsheet Row Most Recent Value  Chronic Disease   Chronic disease during today's visit Hypertension (HTN), Diabetes  General Interventions   General Interventions Discussed/Reviewed General Interventions Reviewed, Labs, Doctor Visits, Annual Eye Exam  Labs Hgb A1c every 3 months  Doctor Visits Discussed/Reviewed Doctor Visits Reviewed, PCP, Annual Wellness Visits  PCP/Specialist Visits Compliance with follow-up visit  Education Interventions   Education Provided Provided Education  Provided Verbal Education On Nutrition, Foot Care, Blood Sugar Monitoring, When to see the doctor  Pharmacy Interventions   Pharmacy Dicussed/Reviewed Pharmacy Topics Reviewed        Follow up plan: Follow up call scheduled for 6/5    Encounter Outcome:  Pt. Visit Completed   Valente David, RN, MSN, [facility] Care Management Coordinator [phone]

## 2022-09-28 ENCOUNTER — Other Ambulatory Visit: Payer: Self-pay | Admitting: Cardiovascular Disease

## 2022-10-09 ENCOUNTER — Other Ambulatory Visit: Payer: Self-pay | Admitting: Family Medicine

## 2022-10-09 DIAGNOSIS — E119 Type 2 diabetes mellitus without complications: Secondary | ICD-10-CM

## 2022-10-24 ENCOUNTER — Encounter: Payer: Self-pay | Admitting: Podiatrist

## 2022-10-24 ENCOUNTER — Ambulatory Visit: Payer: Medicare PPO | Admitting: Podiatry

## 2022-10-24 ENCOUNTER — Ambulatory Visit: Payer: Medicare PPO | Admitting: Podiatrist

## 2022-10-24 DIAGNOSIS — D2371 Other benign neoplasm of skin of right lower limb, including hip: Secondary | ICD-10-CM

## 2022-10-24 DIAGNOSIS — L84 Corns and callosities: Secondary | ICD-10-CM

## 2022-10-24 DIAGNOSIS — D2372 Other benign neoplasm of skin of left lower limb, including hip: Secondary | ICD-10-CM

## 2022-10-24 NOTE — Progress Notes (Signed)
Chief Complaint  Patient presents with   Wound Check    "It's doing pretty good."     HPI: Patient is 67 y.o. female who presents today for recheck of ulcerations bilateral.  She relates she is still doing good and has not noticed any redness, swelling or drainage from any of the areas on her feet.  No pain reported.     Allergies  Allergen Reactions   Amoxicillin-Pot Clavulanate Diarrhea   Aspirin     Higher doses - passed out    Ducodyl [Bisacodyl]     Passed out, extreme cramping    Erythromycin     Stomach Ache Stomach pain.   Glipizide     Extreme stomach aches    Metformin Hcl     Extreme stomach cramps    Latex Rash   Macrobid [Nitrofurantoin]     Severe diarrhea, severe abdominal pain/cramping    Review of systems is negative except as noted in the HPI.  Denies nausea/ vomiting/ fevers/ chills or night sweats.   Denies difficulty breathing, denies calf pain or tenderness  Physical Exam   Objective: Vital signs stable alert oriented x 3.  Pulses are palpable bilateral.  Hallux right demonstrates significant thickening of the soft tissue around the toe and darkening of the toenail. No redness, swelling or sign of infection is present,    She does have severe thick tissue at the left hallux, submetatarsal 5 bilateral whereby the right submetatarsal 5 area has hemorrhagic tissue present and subject to skin breakdown,  she also has a soft callus on her left fourth toe - lateral aspect where the fifth toe rubs against the fourth toe.    Assessment: chronic diabetic ulcerations.  Plan: Discussed treatment options and recommendations.  Today, I debrided the pre ulcerative lesions and benign skin lesions x 3.  I also dispensed a toe cap and spacer for her to try to reduce the friction on the left fourth toe.,  she will call if any concerns arise and otherwise will follow-up with her in 1 month.

## 2022-10-26 ENCOUNTER — Other Ambulatory Visit: Payer: Self-pay | Admitting: *Deleted

## 2022-10-26 DIAGNOSIS — I739 Peripheral vascular disease, unspecified: Secondary | ICD-10-CM

## 2022-11-02 ENCOUNTER — Ambulatory Visit: Payer: Medicare PPO | Attending: Cardiovascular Disease

## 2022-11-02 ENCOUNTER — Ambulatory Visit (INDEPENDENT_AMBULATORY_CARE_PROVIDER_SITE_OTHER): Payer: Medicare PPO

## 2022-11-02 DIAGNOSIS — I739 Peripheral vascular disease, unspecified: Secondary | ICD-10-CM

## 2022-11-02 DIAGNOSIS — Z95828 Presence of other vascular implants and grafts: Secondary | ICD-10-CM

## 2022-11-02 LAB — VAS US ABI WITH/WO TBI: Right ABI: 0.89

## 2022-11-03 LAB — VAS US ABI WITH/WO TBI: Left ABI: 0.94

## 2022-11-13 NOTE — Addendum Note (Signed)
Addended by: Sandi Mariscal on: 11/13/2022 09:32 AM   Modules accepted: Orders

## 2022-11-21 ENCOUNTER — Ambulatory Visit (INDEPENDENT_AMBULATORY_CARE_PROVIDER_SITE_OTHER): Payer: Medicare PPO

## 2022-11-21 VITALS — Ht 68.0 in | Wt 252.0 lb

## 2022-11-21 DIAGNOSIS — Z0000 Encounter for general adult medical examination without abnormal findings: Secondary | ICD-10-CM | POA: Diagnosis not present

## 2022-11-21 NOTE — Patient Instructions (Addendum)
Karen Stephenson , Thank you for taking time to come for your Medicare Wellness Visit. I appreciate your ongoing commitment to your health goals. Please review the following plan we discussed and let me know if I can assist you in the future.   These are the goals we discussed:  Goals      DIET - EAT MORE FRUITS AND VEGETABLES     Exercise 150 minutes per week (moderate activity)     RNCM: Effective Management of DM     Care Coordination Interventions:  Lab Results  Component Value Date   HGBA1C 7.3 (A) 08/29/2022    Provided education to patient about basic DM disease process.  Reviewed medications with patient and discussed importance of medication adherence.  Was taken off Aspirin by cardiology, left on Plavix Counseled on importance of regular laboratory monitoring as prescribed Discussed plans with patient for ongoing care management follow up and provided patient with direct contact information for care management team Provided patient with written educational materials related to hypo and hyperglycemia and importance of correct treatment Reviewed scheduled/upcoming provider appointments including:  AWV 4/10, Podiatry 4/24, and vascular testing to follow up on stents on 5/3.   Advised patient, providing education and rationale, to check cbg as directed and record, calling pcp for findings outside established parameters. The patient again admits she does not always check her blood sugars as she should. Encouraged the patient again to take her blood sugars at least 2-3 times a week and writing down the numbers to take to the pcp upcoming visit.   Review of patient status, including review of consultants reports, relevant laboratory and other test results, and medications completed.  A1C decreased from 7.4 to 7.3, aware of goal less than 7 The patient agrees to regular outreaches from the [facility]. Review of the care coordination program and the patient knows how to reach the [facility] State BP was  slightly elevated during last PCP visit, now within range at 110-120s/60-70s           This is a list of the screening recommended for you and due dates:  Health Maintenance  Topic Date Due   Zoster (Shingles) Vaccine (1 of 2) Never done   DEXA scan (bone density measurement)  Never done   Eye exam for diabetics  08/04/2020   DTaP/Tdap/Td vaccine (3 - Td or Tdap) 04/17/2021   COVID-19 Vaccine (4 - 2023-24 season) 03/02/2022   Mammogram  01/19/2023   Flu Shot  01/31/2023   Hemoglobin A1C  02/27/2023   Complete foot exam   04/12/2023   Yearly kidney function blood test for diabetes  08/30/2023   Yearly kidney health urinalysis for diabetes  08/30/2023   Medicare Annual Wellness Visit  11/21/2023   Colon Cancer Screening  07/18/2027   Pneumonia Vaccine  Completed   Hepatitis C Screening: USPSTF Recommendation to screen - Ages 18-79 yo.  Completed   HPV Vaccine  Aged Out    Advanced directives: no  Conditions/risks identified: none  Next appointment: Follow up in one year for your annual wellness visit 11/27/2023 @ 9:15am telephone   Preventive Care 65 Years and Older, Female Preventive care refers to lifestyle choices and visits with your health care provider that can promote health and wellness. What does preventive care include? A yearly physical exam. This is also called an annual well check. Dental exams once or twice a year. Routine eye exams. Ask your health care provider how often you should have your eyes  checked. Personal lifestyle choices, including: Daily care of your teeth and gums. Regular physical activity. Eating a healthy diet. Avoiding tobacco and drug use. Limiting alcohol use. Practicing safe sex. Taking low-dose aspirin every day. Taking vitamin and mineral supplements as recommended by your health care provider. What happens during an annual well check? The services and screenings done by your health care provider during your annual well check will  depend on your age, overall health, lifestyle risk factors, and family history of disease. Counseling  Your health care provider may ask you questions about your: Alcohol use. Tobacco use. Drug use. Emotional well-being. Home and relationship well-being. Sexual activity. Eating habits. History of falls. Memory and ability to understand (cognition). Work and work Astronomer. Reproductive health. Screening  You may have the following tests or measurements: Height, weight, and BMI. Blood pressure. Lipid and cholesterol levels. These may be checked every 5 years, or more frequently if you are over 56 years old. Skin check. Lung cancer screening. You may have this screening every year starting at age 59 if you have a 30-pack-year history of smoking and currently smoke or have quit within the past 15 years. Fecal occult blood test (FOBT) of the stool. You may have this test every year starting at age 19. Flexible sigmoidoscopy or colonoscopy. You may have a sigmoidoscopy every 5 years or a colonoscopy every 10 years starting at age 56. Hepatitis C blood test. Hepatitis B blood test. Sexually transmitted disease (STD) testing. Diabetes screening. This is done by checking your blood sugar (glucose) after you have not eaten for a while (fasting). You may have this done every 1-3 years. Bone density scan. This is done to screen for osteoporosis. You may have this done starting at age 93. Mammogram. This may be done every 1-2 years. Talk to your health care provider about how often you should have regular mammograms. Talk with your health care provider about your test results, treatment options, and if necessary, the need for more tests. Vaccines  Your health care provider may recommend certain vaccines, such as: Influenza vaccine. This is recommended every year. Tetanus, diphtheria, and acellular pertussis (Tdap, Td) vaccine. You may need a Td booster every 10 years. Zoster vaccine. You may  need this after age 52. Pneumococcal 13-valent conjugate (PCV13) vaccine. One dose is recommended after age 93. Pneumococcal polysaccharide (PPSV23) vaccine. One dose is recommended after age 9. Talk to your health care provider about which screenings and vaccines you need and how often you need them. This information is not intended to replace advice given to you by your health care provider. Make sure you discuss any questions you have with your health care provider. Document Released: 07/15/2015 Document Revised: 03/07/2016 Document Reviewed: 04/19/2015 Elsevier Interactive Patient Education  2017 [employer].  Fall Prevention in the Home Falls can cause injuries. They can happen to people of all ages. There are many things you can do to make your home safe and to help prevent falls. What can I do on the outside of my home? Regularly fix the edges of walkways and driveways and fix any cracks. Remove anything that might make you trip as you walk through a door, such as a raised step or threshold. Trim any bushes or trees on the path to your home. Use bright outdoor lighting. Clear any walking paths of anything that might make someone trip, such as rocks or tools. Regularly check to see if handrails are loose or broken. Make sure that both sides  of any steps have handrails. Any raised decks and porches should have guardrails on the edges. Have any leaves, snow, or ice cleared regularly. Use sand or salt on walking paths during winter. Clean up any spills in your garage right away. This includes oil or grease spills. What can I do in the bathroom? Use night lights. Install grab bars by the toilet and in the tub and shower. Do not use towel bars as grab bars. Use non-skid mats or decals in the tub or shower. If you need to sit down in the shower, use a plastic, non-slip stool. Keep the floor dry. Clean up any water that spills on the floor as soon as it happens. Remove soap buildup in  the tub or shower regularly. Attach bath mats securely with double-sided non-slip rug tape. Do not have throw rugs and other things on the floor that can make you trip. What can I do in the bedroom? Use night lights. Make sure that you have a light by your bed that is easy to reach. Do not use any sheets or blankets that are too big for your bed. They should not hang down onto the floor. Have a firm chair that has side arms. You can use this for support while you get dressed. Do not have throw rugs and other things on the floor that can make you trip. What can I do in the kitchen? Clean up any spills right away. Avoid walking on wet floors. Keep items that you use a lot in easy-to-reach places. If you need to reach something above you, use a strong step stool that has a grab bar. Keep electrical cords out of the way. Do not use floor polish or wax that makes floors slippery. If you must use wax, use non-skid floor wax. Do not have throw rugs and other things on the floor that can make you trip. What can I do with my stairs? Do not leave any items on the stairs. Make sure that there are handrails on both sides of the stairs and use them. Fix handrails that are broken or loose. Make sure that handrails are as long as the stairways. Check any carpeting to make sure that it is firmly attached to the stairs. Fix any carpet that is loose or worn. Avoid having throw rugs at the top or bottom of the stairs. If you do have throw rugs, attach them to the floor with carpet tape. Make sure that you have a light switch at the top of the stairs and the bottom of the stairs. If you do not have them, ask someone to add them for you. What else can I do to help prevent falls? Wear shoes that: Do not have high heels. Have rubber bottoms. Are comfortable and fit you well. Are closed at the toe. Do not wear sandals. If you use a stepladder: Make sure that it is fully opened. Do not climb a closed  stepladder. Make sure that both sides of the stepladder are locked into place. Ask someone to hold it for you, if possible. Clearly mark and make sure that you can see: Any grab bars or handrails. First and last steps. Where the edge of each step is. Use tools that help you move around (mobility aids) if they are needed. These include: Canes. Walkers. Scooters. Crutches. Turn on the lights when you go into a dark area. Replace any light bulbs as soon as they burn out. Set up your furniture so you have  a clear path. Avoid moving your furniture around. If any of your floors are uneven, fix them. If there are any pets around you, be aware of where they are. Review your medicines with your doctor. Some medicines can make you feel dizzy. This can increase your chance of falling. Ask your doctor what other things that you can do to help prevent falls. This information is not intended to replace advice given to you by your health care provider. Make sure you discuss any questions you have with your health care provider. Document Released: 04/14/2009 Document Revised: 11/24/2015 Document Reviewed: 07/23/2014 Elsevier Interactive Patient Education  2017 [employer].

## 2022-11-21 NOTE — Progress Notes (Signed)
I connected with  Deepika B Dilorenzo on 11/21/22 by a audio enabled telemedicine application and verified that I am speaking with the correct person using two identifiers.  Patient Location: Home  Provider Location: Office/Clinic  I discussed the limitations of evaluation and management by telemedicine. The patient expressed understanding and agreed to proceed.+ Subjective:   Karen Stephenson is a 67 y.o. female who presents for Medicare Annual (Subsequent) preventive examination.  Review of Systems     Cardiac Risk Factors include: advanced age (>48men, >52 women);diabetes mellitus;hypertension;sedentary lifestyle;obesity (BMI >30kg/m2)     Objective:    Today's Vitals   11/21/22 1040  Weight: 252 lb (114.3 kg)  Height: 5\' 8"  (1.727 m)   Body mass index is 38.32 kg/m.     11/21/2022   10:52 AM 10/04/2021   11:04 AM 08/30/2021    8:56 AM 08/02/2020    6:00 AM 07/26/2020    9:38 AM 07/17/2017    7:04 AM 12/15/2015    2:02 PM  Advanced Directives  Does Patient Have a Medical Advance Directive? No No No No No No No  Would patient like information on creating a medical advance directive?  No - Patient declined No - Patient declined No - Patient declined No - Patient declined No - Patient declined     Current Medications (verified) Outpatient Encounter Medications as of 11/21/2022  Medication Sig   acarbose (PRECOSE) 25 MG tablet TAKE 1 TABLET BY MOUTH THREE TIMES DAILY WITH MEALS   albuterol (VENTOLIN HFA) 108 (90 Base) MCG/ACT inhaler Inhale 2 puffs into the lungs every 6 (six) hours as needed for wheezing or shortness of breath.   cetirizine (ZYRTEC) 10 MG tablet Take 10 mg by mouth daily.   clopidogrel (PLAVIX) 75 MG tablet Take 1 tablet by mouth once daily   empagliflozin (JARDIANCE) 10 MG TABS tablet Take 1 tablet (10 mg total) by mouth daily before breakfast.   fluticasone (FLONASE) 50 MCG/ACT nasal spray Use 2 spray(s) in each nostril once daily   fluticasone-salmeterol (ADVAIR  DISKUS) 250-50 MCG/ACT AEPB Inhale 1 puff into the lungs in the morning and at bedtime.   glucose blood test strip Check sugar once daily, E 11.9   Homeopathic Products (LEG CRAMP RELIEF PO) Take 2 capsules by mouth daily.    hydrochlorothiazide (HYDRODIURIL) 25 MG tablet Take 1 tablet (25 mg total) by mouth daily.   hyoscyamine (LEVBID) 0.375 MG 12 hr tablet Take 1 tablet (0.375 mg total) by mouth as needed. (Patient taking differently: Take 0.375 mg by mouth every 12 (twelve) hours as needed for cramping.)   Lancets (ONETOUCH ULTRASOFT) lancets Check sugar once daily ,DX E11.9   losartan (COZAAR) 100 MG tablet Take 1 tablet (100 mg total) by mouth daily.   montelukast (SINGULAIR) 10 MG tablet Take 1 tablet (10 mg total) by mouth at bedtime.   Multiple Vitamins-Minerals (MULTIVITAMIN WITH MINERALS) tablet Take 1 tablet by mouth daily.   mupirocin ointment (BACTROBAN) 2 % Apply 1 Application topically 2 (two) times daily.   pantoprazole (PROTONIX) 40 MG tablet Take 1 tablet by mouth once daily   rosuvastatin (CRESTOR) 20 MG tablet Take 1 tablet (20 mg total) by mouth daily.   sitaGLIPtin (JANUVIA) 100 MG tablet Take 1 tablet (100 mg total) by mouth daily.   vitamin B-12 (CYANOCOBALAMIN) 1000 MCG tablet Take 1,000 mcg by mouth daily.   Vitamin D, Cholecalciferol, 50 MCG (2000 UT) CAPS Take 2,000 Units by mouth daily.   zinc gluconate 50  MG tablet Take 50 mg by mouth daily.   No facility-administered encounter medications on file as of 11/21/2022.    Allergies (verified) Amoxicillin-pot clavulanate, Aspirin, Ducodyl [bisacodyl], Erythromycin, Glipizide, Metformin hcl, Latex, and Macrobid [nitrofurantoin]   History: Past Medical History:  Diagnosis Date   Allergy    Anginal pain (HCC)    a. 07/2020 MV: No ischemia/infarct-->low risk.   Asthma    Cataract    I seem to remember Dr. Alvester Morin mentioned a very small cararact during one of my visits.   Diabetes mellitus without complication (HCC)     Family history of adverse reaction to anesthesia    SISTER HAS ISSUE DURING COLONOSCOPY   GERD (gastroesophageal reflux disease)    History of echocardiogram    a. 07/2020 Echo: EF 60-65%, no rwma, nl RV fxn, mild Ao sclerosis w/o stenosis.   Hypercholesteremia    Hypertension    Hypokalemia    Neuromuscular disorder (HCC)    Neuropathy diabetes related   PAD (peripheral artery disease) (HCC)    a. 08/2021 LE Angio/PTA: Heavily calcified short occlusion of the R CIA (s/p balloon expandable covered stent), mild nonobstructive infrainguinal disease.  Mild left SFA disease.  Borderline significant L popliteal disease with three-vessel runoff below the knee.   Sleep apnea    WAS NOT GIVEN CPAP   Past Surgical History:  Procedure Laterality Date   ABDOMINAL AORTOGRAM W/LOWER EXTREMITY N/A 08/30/2021   Procedure: ABDOMINAL AORTOGRAM W/LOWER EXTREMITY;  Surgeon: Iran Ouch, MD;  Location: [facility] INV[facility]IVE [facility];  Service: Cardiovascular;  Laterality: N/A;   ABDOMINAL HYSTERECTOMY  08/16/2020   Dr. Tretha Sciara OBGYN   BACK SURGERY  1986   COLONOSCOPY WITH PROPOFOL N/A 07/17/2017   Procedure: COLONOSCOPY WITH PROPOFOL;  Surgeon: Scot Jun, MD;  [facility] END[facility]COPY;  Service: Endoscopy;  Laterality: N/A;   NASAL SINUS SURGERY     PERIPHERAL VASCULAR INTERVENTION Right 08/30/2021   Procedure: PERIPHERAL VASCULAR INTERVENTION;  Surgeon: Iran Ouch, MD;  Location: [facility] INVASIVE CV LAB;  Service: Cardiovascular;  Laterality: Right;  common Iliac   TOTAL LAPAROSCOPIC HYSTERECTOMY WITH BILATERAL SALPINGO OOPHORECTOMY Bilateral 08/02/2020   Procedure: TOTAL LAPAROSCOPIC HYSTERECTOMY WITH BILATERAL SALPINGO OOPHORECTOMY;  Surgeon: Nadara Mustard, MD;  Location: [facility] ORS;  Service: Gynecology;  Laterality: Bilateral;  RN TO ASSIST   UTERINE FIBROID SURGERY     Family History  Problem Relation Age of Onset   Diabetes Mother    Breast cancer Mother 48   Arthritis Mother     Cancer Mother    Heart disease Father    Alcohol abuse Father    Breast cancer Sister 74       dx twice    Cancer Sister    Heart disease Paternal Grandfather    Hypertension Brother    Colon cancer Neg Hx    Ovarian cancer Neg Hx    Social History   Socioeconomic History   Marital status: Married    Spouse name: Not on file   Number of children: Not on file   Years of education: Not on file   Highest education level: Not on file  Occupational History   Not on file  Tobacco Use   Smoking status: Former    Types: Cigarettes    Quit date: 07/01/2013    Years since quitting: 9.3   Smokeless tobacco: Never  Vaping Use   Vaping Use: Never used  Substance and Sexual Activity   Alcohol use: No  Drug use: No   Sexual activity: Yes    Comment: Hysterectomy  Other Topics Concern   Not on file  Social History Narrative   Not on file   Social Determinants of Health   Financial Resource Strain: Low Risk  (11/20/2022)   Overall Financial Resource Strain (CARDIA)    Difficulty of Paying Living Expenses: Not hard at all  Food Insecurity: No Food Insecurity (11/20/2022)   Hunger Vital Sign    Worried About Running Out of Food in the Last Year: Never true    Ran Out of Food in the Last Year: Never true  Transportation Needs: No Transportation Needs (11/20/2022)   PRAPARE - Administrator, Civil Service (Medical): No    Lack of Transportation (Non-Medical): No  Physical Activity: Inactive (11/20/2022)   Exercise Vital Sign    Days of Exercise per Week: 0 days    Minutes of Exercise per Session: 0 min  Stress: No Stress Concern Present (11/20/2022)   [employer] of Occupational Health - Occupational Stress Questionnaire    Feeling of Stress : Not at all  Social Connections: Unknown (11/20/2022)   Social Connection and Isolation Panel [NHANES]    Frequency of Communication with Friends and Family: More than three times a week    Frequency of Social Gatherings with  Friends and Family: Once a week    Attends Religious Services: Not on Marketing executive or Organizations: Yes    Attends Engineer, structural: More than 4 times per year    Marital Status: Married    Tobacco Counseling Counseling given: Not Answered   Clinical Intake:     Pain : No/denies pain     BMI - recorded: 38.32 Nutritional Status: BMI > 30  Obese Nutritional Risks: None Diabetes: Yes CBG done?: No Did pt. bring in CBG monitor from home?: No  How often do you need to have someone help you when you read instructions, pamphlets, or other written materials from your doctor or pharmacy?: 1 - Never  Diabetic?yes  Interpreter Needed?: No  Comments: lives with husband Information entered by :: B.Ray Gervasi,LPN   Activities of Daily Living    11/20/2022   12:25 PM 04/11/2022    1:36 PM  In your present state of health, do you have any difficulty performing the following activities:  Hearing? 0 0  Vision? 0 0  Difficulty concentrating or making decisions? 0 0  Walking or climbing stairs? 0 0  Dressing or bathing? 0 0  Doing errands, shopping? 0 0  Preparing Food and eating ? N   Using the Toilet? N   In the past six months, have you accidently leaked urine? N   Do you have problems with loss of bowel control? N   Managing your Medications? N   Managing your Finances? N   Housekeeping or managing your Housekeeping? N     Patient Care Team: Ronnald Ramp, MD as PCP - General (Family Medicine) Debbe Odea, MD as PCP - Cardiology (Cardiology) Marlowe Sax, RN as Case Manager (General Practice) Kemper Durie, RN as [facility] Care Management  Indicate any recent Medical Services you may have received from other than [facility] providers in the past year (date may be approximate).     Assessment:   This is a routine wellness examination for Jeslynn.  Hearing/Vision screen Hearing Screening - Comments::  Adequate hearing Vision Screening - Comments:: Adequate vision w/glasses  Eye  Dietary issues and exercise activities discussed: Current Exercise Habits: The patient does not participate in regular exercise at present, Type of exercise: walking, Exercise limited by: orthopedic condition(s)   Goals Addressed             This Visit's Progress    DIET - EAT MORE FRUITS AND VEGETABLES   On track    Exercise 150 minutes per week (moderate activity)   On track      Depression Screen    11/21/2022   10:51 AM 04/11/2022    1:36 PM 11/22/2021    9:58 AM 10/04/2021   11:03 AM 02/15/2021   11:15 AM 04/06/2020    3:18 PM 04/01/2019   10:53 AM  PHQ 2/9 Scores  PHQ - 2 Score 0 0 0 0 0 0 0  PHQ- 9 Score   0  0 0     Fall Risk    11/20/2022   12:25 PM 04/11/2022    1:36 PM 11/22/2021    9:58 AM 10/04/2021   11:05 AM 10/03/2021   11:40 AM  Fall Risk   Falls in the past year? 0 0 0 0 0  Number falls in past yr: 0 0 0 0 0  Injury with Fall? 0 0 0 0 0  Risk for fall due to :  No Fall Risks No Fall Risks No Fall Risks   Follow up   Falls evaluation completed Falls evaluation completed     FALL RISK PREVENTION PERTAINING TO THE HOME:  Any stairs in or around the home? Yes  If so, are there any without handrails? Yes  Home free of loose throw rugs in walkways, pet beds, electrical cords, etc? Yes  Adequate lighting in your home to reduce risk of falls? Yes   ASSISTIVE DEVICES UTILIZED TO PREVENT FALLS:  Life alert? No  Use of a cane, walker or w/c? No  Grab bars in the bathroom? No  Shower chair or bench in shower? Yes  Elevated toilet seat or a handicapped toilet? No    Cognitive Function:        11/21/2022   10:57 AM 04/06/2020   10:40 AM  6CIT Screen  What Year? 0 points 0 points  What month? 0 points 0 points  What time? 0 points 0 points  Count back from 20 0 points 0 points  Months in reverse 0 points 0 points  Repeat phrase 0 points 2 points  Total Score 0  points 2 points    Immunizations Immunization History  Administered Date(s) Administered   Fluad Quad(high Dose 65+) 04/06/2020, 04/11/2022   Influenza Whole 04/23/2017   Influenza,inj,Quad PF,6+ Mos 03/26/2018, 04/01/2019   Moderna Sars-Covid-2 Vaccination 06/30/2020   PFIZER(Purple Top)SARS-COV-2 Vaccination 10/31/2019, 11/24/2019   PNEUMOCOCCAL CONJUGATE-20 11/22/2021   Pneumococcal Polysaccharide-23 02/29/2012, 04/06/2020   Td 08/17/2003   Tdap 04/18/2011    TDAP status: Up to date  Flu Vaccine status: Up to date  Pneumococcal vaccine status: Up to date  Covid-19 vaccine status: Completed vaccines  Qualifies for Shingles Vaccine? Yes   Zostavax completed No   Shingrix Completed?: No.    Education has been provided regarding the importance of this vaccine. Patient has been advised to call insurance company to determine out of pocket expense if they have not yet received this vaccine. Advised may also receive vaccine at local pharmacy or Health Dept. Verbalized acceptance and understanding.  Screening Tests Health Maintenance  Topic Date Due   Zoster Vaccines- Shingrix (1 of  2) Never done   DEXA SCAN  Never done   OPHTHALMOLOGY EXAM  08/04/2020   DTaP/Tdap/Td (3 - Td or Tdap) 04/17/2021   COVID-19 Vaccine (4 - 2023-24 season) 03/02/2022   MAMMOGRAM  01/19/2023   INFLUENZA VACCINE  01/31/2023   HEMOGLOBIN A1C  02/27/2023   FOOT EXAM  04/12/2023   Diabetic kidney evaluation - eGFR measurement  08/30/2023   Diabetic kidney evaluation - Urine ACR  08/30/2023   Medicare Annual Wellness (AWV)  11/21/2023   COLONOSCOPY (Pts 45-49yrs Insurance coverage will need to be confirmed)  07/18/2027   Pneumonia Vaccine 64+ Years old  Completed   Hepatitis C Screening  Completed   HPV VACCINES  Aged Out    Health Maintenance  Health Maintenance Due  Topic Date Due   Zoster Vaccines- Shingrix (1 of 2) Never done   DEXA SCAN  Never done   OPHTHALMOLOGY EXAM  08/04/2020    DTaP/Tdap/Td (3 - Td or Tdap) 04/17/2021   COVID-19 Vaccine (4 - 2023-24 season) 03/02/2022    Colorectal cancer screening: Type of screening: Colonoscopy. Completed 5-10. Repeat every 5-10 years  Mammogram status: Ordered yes. Pt provided with contact info and advised to call to schedule appt.   Bone Density status: Ordered yes. Pt provided with contact info and advised to call to schedule appt.  Lung Cancer Screening: (Low Dose CT Chest recommended if Age 32-80 years, 30 pack-year currently smoking OR have quit w/in 15years.) does not qualify.   Lung Cancer Screening Referral: no  Additional Screening:  Hepatitis C Screening: does not qualify; Completed yes  Vision Screening: Recommended annual ophthalmology exams for early detection of glaucoma and other disorders of the eye. Is the patient up to date with their annual eye exam?  Yes  Who is the provider or what is the name of the office in which the patient attends annual eye exams? [facility] If pt is not established with a provider, would they like to be referred to a provider to establish care? No .   Dental Screening: Recommended annual dental exams for proper oral hygiene  Community Resource Referral / Chronic Care Management: CRR required this visit?  No   CCM required this visit?  No      Plan:     I have personally reviewed and noted the following in the patient's chart:   Medical and social history Use of alcohol, tobacco or illicit drugs  Current medications and supplements including opioid prescriptions. Patient is not currently taking opioid prescriptions. Functional ability and status Nutritional status Physical activity Advanced directives List of other physicians Hospitalizations, surgeries, and ER visits in previous 12 months Vitals Screenings to include cognitive, depression, and falls Referrals and appointments  In addition, I have reviewed and discussed with patient certain preventive  protocols, quality metrics, and best practice recommendations. A written personalized care plan for preventive services as well as general preventive health recommendations were provided to patient.     Sue Lush, LPN   1/61/0960   Nurse Notes: The patient states she is doing well and has no concerns or questions at this time.

## 2022-11-22 ENCOUNTER — Other Ambulatory Visit: Payer: Self-pay | Admitting: Family Medicine

## 2022-11-22 DIAGNOSIS — E119 Type 2 diabetes mellitus without complications: Secondary | ICD-10-CM

## 2022-11-22 NOTE — Telephone Encounter (Signed)
Requested Prescriptions  Pending Prescriptions Disp Refills   JANUVIA 100 MG tablet [Pharmacy Med Name: Januvia 100 MG Oral Tablet] 90 tablet 0    Sig: Take 1 tablet by mouth once daily     Endocrinology:  Diabetes - DPP-4 Inhibitors Passed - 11/22/2022  2:53 PM      Passed - HBA1C is between 0 and 7.9 and within 180 days    Hemoglobin A1C  Date Value Ref Range Status  08/29/2022 7.3 (A) 4.0 - 5.6 % Final   Hgb A1c MFr Bld  Date Value Ref Range Status  11/22/2021 7.8 (H) 4.8 - 5.6 % Final    Comment:             Prediabetes: 5.7 - 6.4          Diabetes: >6.4          Glycemic control for adults with diabetes: <7.0          Passed - Cr in normal range and within 360 days    Creatinine, Ser  Date Value Ref Range Status  08/29/2022 0.94 0.57 - 1.00 mg/dL Final         Passed - Valid encounter within last 6 months    Recent Outpatient Visits           2 months ago Type 2 diabetes mellitus with diabetic polyneuropathy, without long-term current use of insulin (HCC)   [place] [facility] Simmons-Robinson, [place], MD   7 months ago Type 2 diabetes mellitus with diabetic polyneuropathy, without long-term current use of insulin (HCC)   [place] [facility] Simmons-Robinson, [place], MD   1 year ago Encounter for [employer] annual wellness exam   [place] [facility] Bosie Clos, MD   1 year ago Upper respiratory tract infection due to COVID-19 virus   [facility] Alfredia Ferguson, PA-C   1 year ago Type 2 diabetes mellitus with diabetic polyneuropathy, without long-term current use of insulin [facility])   [place] [facility] Bosie Clos, MD

## 2022-11-27 ENCOUNTER — Other Ambulatory Visit: Payer: Self-pay | Admitting: Family Medicine

## 2022-11-27 DIAGNOSIS — E1142 Type 2 diabetes mellitus with diabetic polyneuropathy: Secondary | ICD-10-CM

## 2022-11-28 ENCOUNTER — Ambulatory Visit: Payer: Medicare PPO | Admitting: Podiatry

## 2022-11-28 ENCOUNTER — Encounter: Payer: Self-pay | Admitting: Podiatry

## 2022-11-28 ENCOUNTER — Encounter: Payer: Medicare PPO | Admitting: Family Medicine

## 2022-11-28 DIAGNOSIS — B351 Tinea unguium: Secondary | ICD-10-CM | POA: Diagnosis not present

## 2022-11-28 DIAGNOSIS — D2371 Other benign neoplasm of skin of right lower limb, including hip: Secondary | ICD-10-CM

## 2022-11-28 DIAGNOSIS — M79676 Pain in unspecified toe(s): Secondary | ICD-10-CM | POA: Diagnosis not present

## 2022-11-28 DIAGNOSIS — E1142 Type 2 diabetes mellitus with diabetic polyneuropathy: Secondary | ICD-10-CM | POA: Diagnosis not present

## 2022-11-28 DIAGNOSIS — D2372 Other benign neoplasm of skin of left lower limb, including hip: Secondary | ICD-10-CM | POA: Diagnosis not present

## 2022-11-28 NOTE — Progress Notes (Signed)
She presents today chief complaint of painful elongated toenails and her painful calluses.  States that I like to have these calluses trimmed before the get too far gone.  She states that she is doing much better now controlling her blood sugar and controlling her calluses have just purchased new shoes which are wider she feels that these are going to do well for her.  Objective: Toenails are long thick yellow dystrophic onychomycotic multiple areas of callus tissue to the forefoot bilaterally but none ulcerative at this point.  Assessment: 1 preulcerative lesions forefoot bilateral.  Toenails painful mycotic bilateral.  Plan: Debridement of toenails 1 through 5 bilateral debridement of all benign skin lesions.

## 2022-12-05 ENCOUNTER — Ambulatory Visit: Payer: Self-pay | Admitting: *Deleted

## 2022-12-05 NOTE — Patient Outreach (Signed)
  Care Coordination   Follow Up Visit Note   12/05/2022 Name: Karen Stephenson MRN: [id number] DOB: Apr 12, 1956  Karen Stephenson is a 67 y.o. year old female who sees Simmons-Robinson, Tawanna Cooler, MD for primary care. I spoke with  Karen Stephenson by phone today.  What matters to the patients health and wellness today?  Continue controlled management of DM    Goals Addressed             This Visit's Progress    COMPLETED: RNCM: Effective Management of DM   On track    Care Coordination Interventions:  Provided education to patient about basic DM disease process.  Reviewed medications with patient and discussed importance of medication adherence.  Was taken off Aspirin by cardiology, left on Plavix Counseled on importance of regular laboratory monitoring as prescribed Discussed plans with patient for ongoing care management follow up and provided patient with direct contact information for care management team Provided patient with written educational materials related to hypo and hyperglycemia and importance of correct treatment Advised patient, providing education and rationale, to check cbg as directed and record, calling pcp for findings outside established parameters. The patient again admits she does not always check her blood sugars as she should. Encouraged the patient again to take her blood sugars at least 2-3 times a week and writing down the numbers to take to the pcp upcoming visit.   Review of patient status, including review of consultants reports, relevant laboratory and other test results, and medications completed.  A1C decreased from 7.4 to 7.3, aware of goal less than 7 The patient agrees to regular outreaches from the [facility]. Review of the care coordination program and the patient knows how to reach the [facility].            SDOH assessments and interventions completed:  No     Care Coordination Interventions:  Yes, provided   Interventions Today    Flowsheet Row Most  Recent Value  Chronic Disease   Chronic disease during today's visit Diabetes  General Interventions   General Interventions Discussed/Reviewed General Interventions Reviewed, Doctor Visits, Communication with  Doctor Visits Discussed/Reviewed Doctor Visits Reviewed, PCP  Maurice Small seen by PCP on 2/28, per note recommended for 3 month follow up]  PCP/Specialist Visits Compliance with follow-up visit  Communication with PCP/Specialists  [Contacted PCP office, request made to call patient to schedule follow up, now scheduled for 6/26]  Education Interventions   Education Provided Provided Education  Provided Verbal Education On Nutrition, Foot Care, Eye Care, Labs, Blood Sugar Monitoring, When to see the doctor, Medication  Labs Reviewed Hgb A1c  [Will have repeat A1C this month]       Follow up plan: No further intervention required.   Encounter Outcome:  Pt. Visit Completed   Kemper Durie, RN, MSN, [facility] Care Management Care Management Coordinator [phone]

## 2022-12-25 ENCOUNTER — Telehealth: Payer: Self-pay

## 2022-12-25 NOTE — Telephone Encounter (Signed)
-----   Message from Adline Peals, CMA sent at 12/25/2022  8:31 AM EDT ----- Regarding: FW: Patient needing 3 month follow up appointment with provider  ----- Message ----- From: Kemper Durie, RN Sent: 12/05/2022  10:43 AM EDT To: Kemper Durie, RN; Shelly Bombard, CMA; # Subject: Patient needing 3 month follow up appointmen#  Good morning, Ms. Licht was last seen by Dr. Roxan Hockey on 2/28 and was supposed to have a 3 month follow up.  She was not on the schedule and is needing the appointment scheduled.  Would someone be able to call her to set this up? Thanks, Kemper Durie, RN, MSN, [facility] Care Management Care Management Coordinator [phone]

## 2022-12-25 NOTE — Telephone Encounter (Signed)
Patient cancellled appt due to her husband being in [facility].  She will call us back when he is discharged and can look at her calendar.

## 2022-12-26 ENCOUNTER — Ambulatory Visit: Payer: Medicare PPO | Admitting: Family Medicine

## 2023-01-01 ENCOUNTER — Other Ambulatory Visit: Payer: Self-pay | Admitting: Family Medicine

## 2023-01-09 ENCOUNTER — Other Ambulatory Visit: Payer: Self-pay | Admitting: Family Medicine

## 2023-01-09 DIAGNOSIS — E119 Type 2 diabetes mellitus without complications: Secondary | ICD-10-CM

## 2023-01-14 ENCOUNTER — Telehealth: Payer: Self-pay | Admitting: Family Medicine

## 2023-01-14 DIAGNOSIS — K219 Gastro-esophageal reflux disease without esophagitis: Secondary | ICD-10-CM

## 2023-01-14 MED ORDER — PANTOPRAZOLE SODIUM 40 MG PO TBEC
40.0000 mg | DELAYED_RELEASE_TABLET | Freq: Every day | ORAL | 3 refills | Status: DC
Start: 2023-01-14 — End: 2024-01-08

## 2023-01-14 NOTE — Telephone Encounter (Signed)
[facility] pharmacy is requesting prescription refill pantoprazole (PROTONIX) 40 MG tablet  Please advise

## 2023-01-18 ENCOUNTER — Other Ambulatory Visit: Payer: Self-pay | Admitting: Family Medicine

## 2023-01-18 DIAGNOSIS — I10 Essential (primary) hypertension: Secondary | ICD-10-CM

## 2023-01-18 NOTE — Telephone Encounter (Signed)
Requested Prescriptions  Pending Prescriptions Disp Refills   losartan (COZAAR) 100 MG tablet [Pharmacy Med Name: Losartan Potassium 100 MG Oral Tablet] 90 tablet 0    Sig: Take 1 tablet by mouth once daily     Cardiovascular:  Angiotensin Receptor Blockers Passed - 01/18/2023  2:25 PM      Passed - Cr in normal range and within 180 days    Creatinine, Ser  Date Value Ref Range Status  08/29/2022 0.94 0.57 - 1.00 mg/dL Final         Passed - K in normal range and within 180 days    Potassium  Date Value Ref Range Status  08/29/2022 3.9 3.5 - 5.2 mmol/L Final         Passed - Patient is not pregnant      Passed - Last BP in normal range    BP Readings from Last 1 Encounters:  09/21/22 130/68         Passed - Valid encounter within last 6 months    Recent Outpatient Visits           4 months ago Type 2 diabetes mellitus with diabetic polyneuropathy, without long-term current use of insulin (HCC)   [place] [facility] Simmons-Robinson, [place], MD   9 months ago Type 2 diabetes mellitus with diabetic polyneuropathy, without long-term current use of insulin (HCC)   [place] [facility] Simmons-Robinson, [place], MD   1 year ago Encounter for [employer] annual wellness exam   [facility] Bosie Clos, MD   1 year ago Upper respiratory tract infection due to COVID-19 virus   [facility] Alfredia Ferguson, PA-C   1 year ago Type 2 diabetes mellitus with diabetic polyneuropathy, without long-term current use of insulin [facility])   [place] [facility] Bosie Clos, MD

## 2023-01-23 ENCOUNTER — Ambulatory Visit: Payer: Medicare PPO | Admitting: Podiatry

## 2023-01-23 DIAGNOSIS — L97512 Non-pressure chronic ulcer of other part of right foot with fat layer exposed: Secondary | ICD-10-CM | POA: Diagnosis not present

## 2023-01-23 MED ORDER — CEPHALEXIN 500 MG PO CAPS: 500.0000 mg | ORAL_CAPSULE | Freq: Three times a day (TID) | ORAL | 0 refills | Status: DC

## 2023-01-23 NOTE — Progress Notes (Signed)
She presents today for follow-up of her diabetic ulceration a little premature for her routine check.  She states that her husband's been in the hospital and she has not been watching after herself is much as she has him.  States that her hemoglobin A1c is 7.3.  Objective: Vital signs stable oriented x 3 no change in neurovascular status pulses remain palpable.  Capillary fill time is immediate she has a open wound subfifth metatarsal head of the right foot.  I debrided this sharply today measuring approximately 2.5 cm in diameter probes subcutaneous tissue.  No purulence no malodor.  Assessment: Diabetic ulceration right foot.  Plan: Debrided ulceration today to fat started her on daily applications of Betadine and we will start her back on cephalexin.  I like to follow-up with her in 2 weeks

## 2023-01-30 ENCOUNTER — Ambulatory Visit: Payer: Medicare PPO | Admitting: Podiatry

## 2023-02-04 ENCOUNTER — Other Ambulatory Visit: Payer: Self-pay

## 2023-02-04 ENCOUNTER — Telehealth: Payer: Self-pay | Admitting: Family Medicine

## 2023-02-04 DIAGNOSIS — J302 Other seasonal allergic rhinitis: Secondary | ICD-10-CM

## 2023-02-04 MED ORDER — FLUTICASONE PROPIONATE 50 MCG/ACT NA SUSP: 1.0000 | Freq: Every day | NASAL | 11 refills | Status: DC

## 2023-02-04 NOTE — Telephone Encounter (Signed)
[facility] faxed refill request for the following medications:  fluticasone (FLONASE) 50 MCG/ACT nasal spray    Please advise.

## 2023-02-06 ENCOUNTER — Ambulatory Visit: Payer: Medicare PPO | Admitting: Podiatry

## 2023-02-13 ENCOUNTER — Ambulatory Visit: Payer: Medicare PPO | Admitting: Podiatry

## 2023-02-14 ENCOUNTER — Encounter: Payer: Self-pay | Admitting: Podiatry

## 2023-02-20 ENCOUNTER — Ambulatory Visit: Payer: Medicare PPO | Admitting: Podiatry

## 2023-02-20 ENCOUNTER — Encounter: Payer: Self-pay | Admitting: Podiatry

## 2023-02-20 DIAGNOSIS — E1142 Type 2 diabetes mellitus with diabetic polyneuropathy: Secondary | ICD-10-CM | POA: Diagnosis not present

## 2023-02-20 DIAGNOSIS — L97512 Non-pressure chronic ulcer of other part of right foot with fat layer exposed: Secondary | ICD-10-CM | POA: Diagnosis not present

## 2023-02-20 DIAGNOSIS — M79676 Pain in unspecified toe(s): Secondary | ICD-10-CM | POA: Diagnosis not present

## 2023-02-20 DIAGNOSIS — B351 Tinea unguium: Secondary | ICD-10-CM | POA: Diagnosis not present

## 2023-02-20 NOTE — Progress Notes (Signed)
Karen Stephenson presents today for follow-up of her ulcerative lesion plantar aspect right foot as well as painful elongated toenails.  She states that them with my husband being sick and not able to stay off of my feet is much but I am trying to keep the wound covered and dressed with Betadine and Bactroban ointment alternating.  She states that she does not know what her hemoglobin A1c is nor does she know even what her blood sugars are she has not been checking them lately.  Objective: Vital signs are stable alert and oriented x 3.  Pulses are palpable bilateral.  Ulcerative lesion plantar aspect of the fifth metatarsal phalangeal joint area of the right foot demonstrates a reactive hyperkeratosis once debrided does not demonstrate any type of significant open wound at this point.  She has multiple preulcerative lesions plantar aspect of bilateral foot all were debrided today with no open lesions.  Toenails 1 through 5 are thick yellow dystrophic clinical mycotic bilaterally.  Assessment: Ulcerative lesion plantar aspect subfifth metatarsal head right foot.  This is a diabetic ulceration peripheral vascular disease pain in limb secondary to onychomycosis and neuropathy.  Plan: Debrided nails 1 through 5 bilateral debrided ulcerative lesion with sharp chisel blade today not demonstrating any probing to bone I redressed the wound today dressed a compressive dressing and encouraged her to do so and try to stay off this is much as possible I will follow-up with her in 1 month

## 2023-02-22 ENCOUNTER — Other Ambulatory Visit: Payer: Self-pay | Admitting: Family Medicine

## 2023-02-22 DIAGNOSIS — E119 Type 2 diabetes mellitus without complications: Secondary | ICD-10-CM

## 2023-02-26 ENCOUNTER — Ambulatory Visit: Payer: Medicare PPO | Admitting: Podiatry

## 2023-03-18 ENCOUNTER — Encounter: Payer: Self-pay | Admitting: Podiatry

## 2023-03-27 ENCOUNTER — Ambulatory Visit: Payer: Medicare PPO | Admitting: Podiatry

## 2023-03-27 ENCOUNTER — Encounter: Payer: Self-pay | Admitting: Podiatry

## 2023-03-27 DIAGNOSIS — E1142 Type 2 diabetes mellitus with diabetic polyneuropathy: Secondary | ICD-10-CM | POA: Diagnosis not present

## 2023-03-27 DIAGNOSIS — D2372 Other benign neoplasm of skin of left lower limb, including hip: Secondary | ICD-10-CM | POA: Diagnosis not present

## 2023-03-27 DIAGNOSIS — D2371 Other benign neoplasm of skin of right lower limb, including hip: Secondary | ICD-10-CM

## 2023-03-27 DIAGNOSIS — M79676 Pain in unspecified toe(s): Secondary | ICD-10-CM

## 2023-03-27 DIAGNOSIS — B351 Tinea unguium: Secondary | ICD-10-CM

## 2023-03-27 DIAGNOSIS — L97512 Non-pressure chronic ulcer of other part of right foot with fat layer exposed: Secondary | ICD-10-CM | POA: Diagnosis not present

## 2023-03-27 NOTE — Progress Notes (Signed)
Karen Stephenson  presents today chief complaint of painful elongated toenails as well as ulcer subfifth metatarsal head of the right foot..  She denies fever chills nausea vomit muscle aches pains calf pain back pain chest pain shortness of breath.  Objective: Pulses are palpable toenails are long thick yellow dystrophic clinical mycotic she has multiple regions of hyperkeratotic tissue and skin breakdown subfifth metatarsal bilaterally subfirst metatarsal right foot.  None of these areas probe deep pass down subcutaneous tissue.  There is minimal bleeding.  No purulence.  Assessment: Pain in limb secondary to onychomycosis benign skin lesions as well as diabetic ulceration.  Plan: Discussed etiology pathology conservative surgical therapies at this point we are going to request diabetic shoes due to the neuropathy and the chronic diabetic ulcerations and angiopathy.  We also debrided toenails 1 through 5 today debrided diabetic ulcerations and benign skin lesions placed padding and dressing.  We will follow-up with her in 2 weeks to make sure she is improving should she develop any signs of infection she is to notify us immediately are go directly to the emergency department.

## 2023-04-10 ENCOUNTER — Encounter: Payer: Self-pay | Admitting: Podiatry

## 2023-04-10 ENCOUNTER — Ambulatory Visit: Payer: Medicare PPO | Admitting: Podiatry

## 2023-04-10 DIAGNOSIS — L97512 Non-pressure chronic ulcer of other part of right foot with fat layer exposed: Secondary | ICD-10-CM | POA: Diagnosis not present

## 2023-04-10 NOTE — Progress Notes (Signed)
She presents today for follow-up of her ulceration subfifth bilateral and hallux left.  She states that seems to be getting better.  Objective: Vital signs are stable alert oriented x 3.  There is no erythema edema cellulitis drainage or odor completely closed at this point she does have hypertrophic tissue surrounding it.  I debrided all of this today there is no open lesions or wounds appears to be healing nicely.  Assessment: Well-healing diabetic ulcer  Plan: Highly recommended she continue all conservative therapies.  I will follow-up with her in 1 month

## 2023-04-13 ENCOUNTER — Other Ambulatory Visit: Payer: Self-pay | Admitting: Family Medicine

## 2023-04-13 DIAGNOSIS — E119 Type 2 diabetes mellitus without complications: Secondary | ICD-10-CM

## 2023-04-15 NOTE — Telephone Encounter (Signed)
Requested medication (s) are due for refill today: yes   Requested medication (s) are on the active medication list: yes   Last refill:  01/09/23 #270 0 refills   Future visit scheduled: no   Notes to clinic:  protocol failed last labs 08/29/22 do you want to refill Rx?     Requested Prescriptions  Pending Prescriptions Disp Refills   acarbose (PRECOSE) 25 MG tablet [Pharmacy Med Name: Acarbose 25 MG Oral Tablet] 270 tablet 0    Sig: TAKE 1 TABLET BY MOUTH THREE TIMES DAILY WITH MEALS     Endocrinology:  Diabetes - Alpha Glucosidase Inhibitors - acarbose Failed - 04/13/2023  7:37 PM      Failed - HBA1C is between 0 and 7.9 and within 180 days    Hemoglobin A1C  Date Value Ref Range Status  08/29/2022 7.3 (A) 4.0 - 5.6 % Final   Hgb A1c MFr Bld  Date Value Ref Range Status  11/22/2021 7.8 (H) 4.8 - 5.6 % Final    Comment:             Prediabetes: 5.7 - 6.4          Diabetes: >6.4          Glycemic control for adults with diabetes: <7.0          Failed - AST in normal range and within 360 days    AST  Date Value Ref Range Status  06/08/2020 24 0 - 40 IU/L Final         Failed - ALT in normal range and within 360 days    ALT  Date Value Ref Range Status  06/08/2020 24 0 - 32 IU/L Final         Passed - Cr in normal range and within 360 days    Creatinine, Ser  Date Value Ref Range Status  08/29/2022 0.94 0.57 - 1.00 mg/dL Final         Passed - Valid encounter within last 6 months    Recent Outpatient Visits           7 months ago Type 2 diabetes mellitus with diabetic polyneuropathy, without long-term current use of insulin (HCC)   [place] [facility] Simmons-Robinson, [place], MD   1 year ago Type 2 diabetes mellitus with diabetic polyneuropathy, without long-term current use of insulin (HCC)   [place] [facility] Simmons-Robinson, [place], MD   1 year ago Encounter for [employer] annual wellness exam   [place]  [facility] Bosie Clos, MD   1 year ago Upper respiratory tract infection due to COVID-19 virus   [facility] Alfredia Ferguson, PA-C   2 years ago Type 2 diabetes mellitus with diabetic polyneuropathy, without long-term current use of insulin [facility])   [place] [facility] Bosie Clos, MD

## 2023-04-19 ENCOUNTER — Ambulatory Visit: Payer: Medicare PPO

## 2023-04-19 NOTE — Progress Notes (Signed)
Patient presents to the office today for diabetic shoe and insole measuring.  Patient was measured with brannock device to determine size and width for 1 pair of extra depth shoes and foam casted for 3 pair of insoles.   Documentation of medical necessity will be sent to patient's treating diabetic doctor to verify and sign.   Patient's diabetic provider: Farrell Ours MD   Shoes and insoles will be ordered at that time and patient will be notified for an appointment for fitting when they arrive.   Shoe size (per patient):f 11 Brannock measurement: 10.5 Patient shoe selection- Shoe choice:   [id number] / A600W Shoe size ordered: 11XWD ABN and financials signed

## 2023-04-27 ENCOUNTER — Other Ambulatory Visit: Payer: Self-pay | Admitting: Family Medicine

## 2023-04-27 DIAGNOSIS — I10 Essential (primary) hypertension: Secondary | ICD-10-CM

## 2023-04-27 DIAGNOSIS — E1142 Type 2 diabetes mellitus with diabetic polyneuropathy: Secondary | ICD-10-CM

## 2023-04-29 NOTE — Telephone Encounter (Signed)
Requested Prescriptions  Pending Prescriptions Disp Refills   losartan (COZAAR) 100 MG tablet [Pharmacy Med Name: Losartan Potassium 100 MG Oral Tablet] 90 tablet 0    Sig: Take 1 tablet by mouth once daily     Cardiovascular:  Angiotensin Receptor Blockers Failed - 04/27/2023 11:53 PM      Failed - Cr in normal range and within 180 days    Creatinine, Ser  Date Value Ref Range Status  08/29/2022 0.94 0.57 - 1.00 mg/dL Final         Failed - K in normal range and within 180 days    Potassium  Date Value Ref Range Status  08/29/2022 3.9 3.5 - 5.2 mmol/L Final         Passed - Patient is not pregnant      Passed - Last BP in normal range    BP Readings from Last 1 Encounters:  09/21/22 130/68         Passed - Valid encounter within last 6 months    Recent Outpatient Visits           8 months ago Type 2 diabetes mellitus with diabetic polyneuropathy, without long-term current use of insulin (HCC)   [place] [facility] Simmons-Robinson, [place], MD   1 year ago Type 2 diabetes mellitus with diabetic polyneuropathy, without long-term current use of insulin (HCC)   [place] [facility] Simmons-Robinson, [place], MD   1 year ago Encounter for [employer] annual wellness exam   [place] [facility] Bosie Clos, MD   1 year ago Upper respiratory tract infection due to COVID-19 virus   [facility] Alfredia Ferguson, PA-C   2 years ago Type 2 diabetes mellitus with diabetic polyneuropathy, without long-term current use of insulin [facility])   [place] [facility] Bosie Clos, MD

## 2023-05-01 ENCOUNTER — Encounter: Payer: Self-pay | Admitting: Podiatry

## 2023-05-01 ENCOUNTER — Ambulatory Visit: Payer: Medicare PPO | Admitting: Podiatry

## 2023-05-01 DIAGNOSIS — M79676 Pain in unspecified toe(s): Secondary | ICD-10-CM | POA: Diagnosis not present

## 2023-05-01 DIAGNOSIS — L97512 Non-pressure chronic ulcer of other part of right foot with fat layer exposed: Secondary | ICD-10-CM | POA: Diagnosis not present

## 2023-05-01 DIAGNOSIS — E1142 Type 2 diabetes mellitus with diabetic polyneuropathy: Secondary | ICD-10-CM

## 2023-05-01 DIAGNOSIS — B351 Tinea unguium: Secondary | ICD-10-CM

## 2023-05-01 NOTE — Progress Notes (Signed)
She presents today for routine nail debridement diabetic foot check ulcerative lesion check.  Objective: Vital signs are stable she is alert oriented x 3 there is no erythema Dem salines drainage odor ulcerative lesions was debrided demonstrates a very small lesion subfifth metatarsal head of the right foot but debriding considerable reactive hyperkeratotic tissue no purulence no malodor.  Otherwise toenails are long thick yellow dystrophic like mycotic and without further skin breakdown.  Assessment: Diabetic ulceration subfifth metatarsal head of the right foot and pain limb secondary to onychomycosis with peripheral disease.  Plan: Debrided all benign skin lesions debrided the ulcerative lesion subfifth met right.  And debrided toenails 1 through 5 bilateral.  Redressed the foot today dressed a compressive dressing follow-up with her in 1 month  Encouraged her to get her hemoglobin A1c back more in the normal range and to continue to dress this foot on daily basis.

## 2023-05-07 NOTE — Progress Notes (Unsigned)
Established patient visit   Patient: Karen Stephenson   DOB: [birth year]   67 y.o. Female  MRN: [id number] Visit Date: 05/08/2023  Today's healthcare provider: Ronnald Ramp, MD   No chief complaint on file.  Subjective       Discussed the use of AI scribe software for clinical note transcription with the patient, who gave verbal consent to proceed.  History of Present Illness             Past Medical History:  Diagnosis Date   Allergy    Anginal pain (HCC)    a. 07/2020 MV: No ischemia/infarct-->low risk.   Asthma    Cataract    I seem to remember Dr. Alvester Morin mentioned a very small cararact during one of my visits.   Diabetes mellitus without complication (HCC)    Family history of adverse reaction to anesthesia    SISTER HAS ISSUE DURING COLONOSCOPY   GERD (gastroesophageal reflux disease)    History of echocardiogram    a. 07/2020 Echo: EF 60-65%, no rwma, nl RV fxn, mild Ao sclerosis w/o stenosis.   Hypercholesteremia    Hypertension    Hypokalemia    Neuromuscular disorder (HCC)    Neuropathy diabetes related   PAD (peripheral artery disease) (HCC)    a. 08/2021 LE Angio/PTA: Heavily calcified short occlusion of the R CIA (s/p balloon expandable covered stent), mild nonobstructive infrainguinal disease.  Mild left SFA disease.  Borderline significant L popliteal disease with three-vessel runoff below the knee.   Sleep apnea    WAS NOT GIVEN CPAP    Medications: Outpatient Medications Prior to Visit  Medication Sig   acarbose (PRECOSE) 25 MG tablet TAKE 1 TABLET BY MOUTH THREE TIMES DAILY WITH MEALS   albuterol (VENTOLIN HFA) 108 (90 Base) MCG/ACT inhaler Inhale 2 puffs into the lungs every 6 (six) hours as needed for wheezing or shortness of breath.   cephALEXin (KEFLEX) 500 MG capsule Take 1 capsule (500 mg total) by mouth 3 (three) times daily.   cetirizine (ZYRTEC) 10 MG tablet Take 10 mg by mouth daily.   clopidogrel (PLAVIX) 75 MG tablet Take  1 tablet by mouth once daily   fluticasone (FLONASE) 50 MCG/ACT nasal spray Place 1 spray into both nostrils daily.   fluticasone-salmeterol (ADVAIR DISKUS) 250-50 MCG/ACT AEPB Inhale 1 puff into the lungs in the morning and at bedtime.   glucose blood test strip Check sugar once daily, E 11.9   Homeopathic Products (LEG CRAMP RELIEF PO) Take 2 capsules by mouth daily.    hydrochlorothiazide (HYDRODIURIL) 25 MG tablet Take 1 tablet by mouth once daily   hyoscyamine (LEVBID) 0.375 MG 12 hr tablet Take 1 tablet (0.375 mg total) by mouth as needed. (Patient taking differently: Take 0.375 mg by mouth every 12 (twelve) hours as needed for cramping.)   JANUVIA 100 MG tablet Take 1 tablet by mouth once daily   JARDIANCE 10 MG TABS tablet TAKE 1 TABLET BY MOUTH ONCE DAILY BEFORE BREAKFAST   Lancets (ONETOUCH ULTRASOFT) lancets Check sugar once daily ,DX E11.9   losartan (COZAAR) 100 MG tablet Take 1 tablet by mouth once daily   montelukast (SINGULAIR) 10 MG tablet Take 1 tablet (10 mg total) by mouth at bedtime.   Multiple Vitamins-Minerals (MULTIVITAMIN WITH MINERALS) tablet Take 1 tablet by mouth daily.   mupirocin ointment (BACTROBAN) 2 % Apply 1 Application topically 2 (two) times daily.   pantoprazole (PROTONIX) 40 MG tablet Take 1 tablet (  40 mg total) by mouth daily.   rosuvastatin (CRESTOR) 20 MG tablet Take 1 tablet (20 mg total) by mouth daily.   vitamin B-12 (CYANOCOBALAMIN) 1000 MCG tablet Take 1,000 mcg by mouth daily.   Vitamin D, Cholecalciferol, 50 MCG (2000 UT) CAPS Take 2,000 Units by mouth daily.   zinc gluconate 50 MG tablet Take 50 mg by mouth daily.   No facility-administered medications prior to visit.    Review of Systems  Last metabolic panel Lab Results  Component Value Date   GLUCOSE 186 (H) 08/29/2022   NA 141 08/29/2022   K 3.9 08/29/2022   CL 103 08/29/2022   CO2 23 08/29/2022   BUN 13 08/29/2022   CREATININE 0.94 08/29/2022   EGFR 66 08/29/2022   CALCIUM 10.0  08/29/2022   PROT 7.3 06/08/2020   ALBUMIN 4.2 06/08/2020   LABGLOB 3.1 06/08/2020   AGRATIO 1.4 06/08/2020   BILITOT 0.6 06/08/2020   ALKPHOS 71 06/08/2020   AST 24 06/08/2020   ALT 24 06/08/2020   Last lipids Lab Results  Component Value Date   CHOL 117 11/22/2021   HDL 36 (L) 11/22/2021   LDLCALC 58 11/22/2021   TRIG 127 11/22/2021   CHOLHDL 3.3 11/22/2021   Last hemoglobin A1c Lab Results  Component Value Date   HGBA1C 7.3 (A) 08/29/2022   Last thyroid functions Lab Results  Component Value Date   TSH 2.450 11/22/2021   Last vitamin D Lab Results  Component Value Date   VD25OH 49.3 11/06/2016      {See past labs  Heme  Chem  Endocrine  Serology  Results Review (optional):1}   Objective    There were no vitals taken for this visit. BP Readings from Last 3 Encounters:  09/21/22 130/68  08/29/22 (!) 141/83  08/29/22 (!) 162/85   Wt Readings from Last 3 Encounters:  11/21/22 252 lb (114.3 kg)  09/21/22 252 lb 8 oz (114.5 kg)  08/29/22 254 lb 9.6 oz (115.5 kg)    {See vitals history (optional):1}      Physical Exam  ***  No results found for any visits on 05/08/23.  Assessment & Plan     Problem List Items Addressed This Visit       Cardiovascular and Mediastinum   Essential (primary) hypertension - Primary     Endocrine   Diabetes mellitus, type 2 (HCC)     Other   Hypercholesteremia    Assessment and Plan              No follow-ups on file.         Ronnald Ramp, MD  [facility] [phone] (phone) [phone] (fax)  [facility]

## 2023-05-08 ENCOUNTER — Ambulatory Visit: Payer: Medicare PPO | Admitting: Family Medicine

## 2023-05-08 ENCOUNTER — Encounter: Payer: Self-pay | Admitting: Family Medicine

## 2023-05-08 VITALS — BP 138/59 | HR 74 | Ht 68.0 in | Wt 260.0 lb

## 2023-05-08 DIAGNOSIS — Z23 Encounter for immunization: Secondary | ICD-10-CM

## 2023-05-08 DIAGNOSIS — E1142 Type 2 diabetes mellitus with diabetic polyneuropathy: Secondary | ICD-10-CM

## 2023-05-08 DIAGNOSIS — E7800 Pure hypercholesterolemia, unspecified: Secondary | ICD-10-CM | POA: Diagnosis not present

## 2023-05-08 DIAGNOSIS — I10 Essential (primary) hypertension: Secondary | ICD-10-CM | POA: Diagnosis not present

## 2023-05-08 DIAGNOSIS — Z7984 Long term (current) use of oral hypoglycemic drugs: Secondary | ICD-10-CM | POA: Diagnosis not present

## 2023-05-08 MED ORDER — HYDROCHLOROTHIAZIDE 25 MG PO TABS
25.0000 mg | ORAL_TABLET | Freq: Every day | ORAL | 1 refills | Status: DC
Start: 2023-05-08 — End: 2024-01-27

## 2023-05-08 MED ORDER — EMPAGLIFLOZIN 10 MG PO TABS
10.0000 mg | ORAL_TABLET | Freq: Every day | ORAL | 1 refills | Status: DC
Start: 2023-05-08 — End: 2023-11-04

## 2023-05-09 LAB — BMP8+EGFR
BUN/Creatinine Ratio: 17 (ref 12–28)
BUN: 12 mg/dL (ref 8–27)
CO2: 23 mmol/L (ref 20–29)
Calcium: 9.7 mg/dL (ref 8.7–10.3)
Chloride: 105 mmol/L (ref 96–106)
Creatinine, Ser: 0.72 mg/dL (ref 0.57–1.00)
Glucose: 210 mg/dL — ABNORMAL HIGH (ref 70–99)
Potassium: 4.2 mmol/L (ref 3.5–5.2)
Sodium: 140 mmol/L (ref 134–144)
eGFR: 90 mL/min/{1.73_m2} (ref >59)

## 2023-05-09 LAB — HEMOGLOBIN A1C
Est. average glucose Bld gHb Est-mCnc: 160 mg/dL
Hgb A1c MFr Bld: 7.2 % — ABNORMAL HIGH (ref 4.8–5.6)

## 2023-05-09 NOTE — Assessment & Plan Note (Signed)
Type 2 Diabetes Mellitus,chronic Last glucose in February 2024 was 186. Patient is on Jardiance 10mg  daily, Januvia 100mg  daily, and acarbose 25mg  TID with meals. -Refill Jardiance 10mg  daily. -Continue Januvia 100mg  daily and acarbose 25mg  TID with meals. -Check A1C and basic metabolic panel today.

## 2023-05-09 NOTE — Assessment & Plan Note (Signed)
Hypertension,chronic Patient reported missing doses of hydrochlorothiazide leading to lower extremity edema. Blood pressure at visit was 138/59 (goal less than 130/80). -Refill hydrochlorothiazide 25mg  daily. -Continue losartan 100mg  daily. -BMP collected today

## 2023-05-09 NOTE — Assessment & Plan Note (Signed)
Chronic  Continue crestor 20mg  daily  Last LDL 63 in 2023

## 2023-05-20 ENCOUNTER — Other Ambulatory Visit: Payer: Self-pay | Admitting: Family Medicine

## 2023-05-20 DIAGNOSIS — E119 Type 2 diabetes mellitus without complications: Secondary | ICD-10-CM

## 2023-05-21 NOTE — Telephone Encounter (Signed)
Requested Prescriptions  Pending Prescriptions Disp Refills   JANUVIA 100 MG tablet [Pharmacy Med Name: Januvia 100 MG Oral Tablet] 90 tablet 0    Sig: Take 1 tablet by mouth once daily     Endocrinology:  Diabetes - DPP-4 Inhibitors Passed - 05/20/2023 11:51 AM      Passed - HBA1C is between 0 and 7.9 and within 180 days    Hgb A1c MFr Bld  Date Value Ref Range Status  05/08/2023 7.2 (H) 4.8 - 5.6 % Final    Comment:             Prediabetes: 5.7 - 6.4          Diabetes: >6.4          Glycemic control for adults with diabetes: <7.0          Passed - Cr in normal range and within 360 days    Creatinine, Ser  Date Value Ref Range Status  05/08/2023 0.72 0.57 - 1.00 mg/dL Final         Passed - Valid encounter within last 6 months    Recent Outpatient Visits           1 week ago Essential (primary) hypertension   [facility] Simmons-Robinson, [place], MD   8 months ago Type 2 diabetes mellitus with diabetic polyneuropathy, without long-term current use of insulin (HCC)   [facility] Simmons-Robinson, [place], MD   1 year ago Type 2 diabetes mellitus with diabetic polyneuropathy, without long-term current use of insulin (HCC)   [place] [facility] Simmons-Robinson, [place], MD   1 year ago Encounter for [employer] annual wellness exam   [facility] Bosie Clos, MD   1 year ago Upper respiratory tract infection due to COVID-19 virus   [facility] Alfredia Ferguson, PA-C       Future Appointments             In 2 months Simmons-Robinson, Tawanna Cooler, MD [facility]

## 2023-06-05 ENCOUNTER — Ambulatory Visit: Payer: Medicare PPO | Admitting: Podiatry

## 2023-06-05 ENCOUNTER — Encounter: Payer: Self-pay | Admitting: Podiatry

## 2023-06-05 DIAGNOSIS — L97512 Non-pressure chronic ulcer of other part of right foot with fat layer exposed: Secondary | ICD-10-CM | POA: Diagnosis not present

## 2023-06-05 DIAGNOSIS — D2372 Other benign neoplasm of skin of left lower limb, including hip: Secondary | ICD-10-CM | POA: Diagnosis not present

## 2023-06-05 DIAGNOSIS — E1142 Type 2 diabetes mellitus with diabetic polyneuropathy: Secondary | ICD-10-CM

## 2023-06-05 DIAGNOSIS — B351 Tinea unguium: Secondary | ICD-10-CM

## 2023-06-05 DIAGNOSIS — L03115 Cellulitis of right lower limb: Secondary | ICD-10-CM

## 2023-06-05 DIAGNOSIS — D2371 Other benign neoplasm of skin of right lower limb, including hip: Secondary | ICD-10-CM | POA: Diagnosis not present

## 2023-06-05 DIAGNOSIS — M79676 Pain in unspecified toe(s): Secondary | ICD-10-CM

## 2023-06-05 MED ORDER — CEPHALEXIN 500 MG PO CAPS: 500.0000 mg | ORAL_CAPSULE | Freq: Three times a day (TID) | ORAL | 1 refills | Status: DC

## 2023-06-06 NOTE — Progress Notes (Signed)
She presents today for follow-up of ulceration and elongated toenails right foot.  States that her leg is swollen and painful to touch distally.  Objective: Vital signs stable alert and oriented x 3 pulses are palpable.  She does have an open wound subfifth met right.  There is no purulence no malodor and it appears to be relatively superficial.  I debrided all of the necrotic tissue and it measures approximately a centimeter in diameter.  There was some tracking dorsally along the side which is now drained but the skin is still loose over it of is an obvious abscess.  Toenails are long thick yellow dystrophic clinical mycotic.  She has warmth and swelling with redness along the anterior anterolateral aspect of the right leg distally.  She has no calf pain.  Assessment: Cellulitis right leg.  Painful mycotic nails with diabetic peripheral neuropathy.  And diabetic ulceration chronic right.  Plan: Started her on Keflex 500 mg 3 times daily for the cellulitis she will continue to treat the ulcerative lesion as she knows how to do with antibiotic ointment offloading.  I debrided nails 1 through 5 bilateral.  I will follow-up with her in 2 weeks.  We did discuss symptoms of systemic infection should she experience these she is to go to the emergency room immediately

## 2023-06-17 ENCOUNTER — Ambulatory Visit: Payer: Medicare PPO | Admitting: Podiatry

## 2023-06-17 ENCOUNTER — Encounter: Payer: Self-pay | Admitting: Podiatry

## 2023-06-17 DIAGNOSIS — L97521 Non-pressure chronic ulcer of other part of left foot limited to breakdown of skin: Secondary | ICD-10-CM | POA: Diagnosis not present

## 2023-06-17 DIAGNOSIS — E1142 Type 2 diabetes mellitus with diabetic polyneuropathy: Secondary | ICD-10-CM

## 2023-06-17 DIAGNOSIS — L97511 Non-pressure chronic ulcer of other part of right foot limited to breakdown of skin: Secondary | ICD-10-CM | POA: Diagnosis not present

## 2023-06-17 MED ORDER — MUPIROCIN 2 % EX OINT: 1.0000 | TOPICAL_OINTMENT | Freq: Two times a day (BID) | CUTANEOUS | 0 refills | Status: DC

## 2023-06-17 NOTE — Progress Notes (Signed)
She follows up for her ulceration right foot.  States that is doing better.  She denies fever chills nausea vomit muscle aches and pains states she finished up her antibiotics.  Objective: Pulses remain palpable right foot.  Forefoot does demonstrate ulcerative lesion which appears to be healing and readily believable upon debridement.  All necrotic tissue was sharply resected.  I debrided the ulcerative lesion to bleeding removing any infection that I saw.  I did increase the size of the wound considerably but at this point she does appear to be healing nicely.  Assessment: Well-healing ulcerative lesion right foot.  Plan: Redressed today dressed a compressive dressing follow-up with [place] in about 2 weeks just for another check.

## 2023-07-01 ENCOUNTER — Ambulatory Visit: Payer: Medicare PPO | Admitting: Podiatry

## 2023-07-02 ENCOUNTER — Ambulatory Visit: Payer: Medicare PPO | Admitting: Podiatry

## 2023-07-15 ENCOUNTER — Other Ambulatory Visit: Payer: Self-pay | Admitting: Cardiovascular Disease

## 2023-07-15 ENCOUNTER — Other Ambulatory Visit: Payer: Self-pay | Admitting: Family Medicine

## 2023-07-15 DIAGNOSIS — E119 Type 2 diabetes mellitus without complications: Secondary | ICD-10-CM

## 2023-07-16 ENCOUNTER — Ambulatory Visit: Payer: Medicare PPO | Admitting: Podiatry

## 2023-07-24 ENCOUNTER — Encounter: Payer: Self-pay | Admitting: Podiatry

## 2023-07-29 ENCOUNTER — Ambulatory Visit: Payer: Medicare PPO | Admitting: Podiatry

## 2023-07-29 DIAGNOSIS — E1142 Type 2 diabetes mellitus with diabetic polyneuropathy: Secondary | ICD-10-CM

## 2023-07-29 DIAGNOSIS — L97511 Non-pressure chronic ulcer of other part of right foot limited to breakdown of skin: Secondary | ICD-10-CM | POA: Diagnosis not present

## 2023-07-29 DIAGNOSIS — D2371 Other benign neoplasm of skin of right lower limb, including hip: Secondary | ICD-10-CM

## 2023-07-29 DIAGNOSIS — B351 Tinea unguium: Secondary | ICD-10-CM | POA: Diagnosis not present

## 2023-07-29 DIAGNOSIS — D2372 Other benign neoplasm of skin of left lower limb, including hip: Secondary | ICD-10-CM

## 2023-07-29 DIAGNOSIS — M79676 Pain in unspecified toe(s): Secondary | ICD-10-CM | POA: Diagnosis not present

## 2023-07-29 NOTE — Progress Notes (Signed)
She presents today for diabetic checkup and check of her ulcerations plantar aspect of the forefoot bilaterally.  She would also like to have her nails cut.  Objective: Vitals are stable alert oriented x 3.  Pulses are palpable.  Debrided the reactive hyperkeratotic lesion subfifth metatarsal of the right foot initially measuring 3 mm in diameter now measuring almost a centimeter in diameter with bleeding.  No purulence no malodor.  She has older preulcerative lesions around the hallux particularly interphalangeal joint on the right foot and minimally so on the left foot same areas.  But no through and through skin lesions.  Toenails are thick yellow dystrophic clinical mycotic subungual debris they are painful on palpation though the majority of her feet are neuropathic.  Assessment: Diabetic peripheral vascular disease diabetic peripheral neuropathy diabetic ulceration right over left.  Pain limb secondary to onychomycosis.  Plan: Debrided wound today placed typical padding for her she will continue to do so on a daily basis debrided all of her nails 1 through 5 bilaterally.  Will watch for signs symptoms of infection I will follow-up with her in 2 weeks she knows to try to stay off of this is much as she can keep her blood sugars tight and continue to wear surgical shoe.

## 2023-07-30 ENCOUNTER — Other Ambulatory Visit: Payer: Self-pay | Admitting: Family Medicine

## 2023-07-30 DIAGNOSIS — I10 Essential (primary) hypertension: Secondary | ICD-10-CM

## 2023-08-07 ENCOUNTER — Encounter: Payer: Self-pay | Admitting: Family Medicine

## 2023-08-07 ENCOUNTER — Ambulatory Visit: Payer: Medicare PPO | Admitting: Family Medicine

## 2023-08-07 VITALS — BP 127/71 | HR 65 | Resp 18 | Ht 68.0 in | Wt 252.3 lb

## 2023-08-07 DIAGNOSIS — Z1231 Encounter for screening mammogram for malignant neoplasm of breast: Secondary | ICD-10-CM

## 2023-08-07 DIAGNOSIS — G4733 Obstructive sleep apnea (adult) (pediatric): Secondary | ICD-10-CM

## 2023-08-07 DIAGNOSIS — J3089 Other allergic rhinitis: Secondary | ICD-10-CM

## 2023-08-07 DIAGNOSIS — E1169 Type 2 diabetes mellitus with other specified complication: Secondary | ICD-10-CM

## 2023-08-07 DIAGNOSIS — I200 Unstable angina: Secondary | ICD-10-CM

## 2023-08-07 DIAGNOSIS — I10 Essential (primary) hypertension: Secondary | ICD-10-CM | POA: Diagnosis not present

## 2023-08-07 DIAGNOSIS — Z780 Asymptomatic menopausal state: Secondary | ICD-10-CM

## 2023-08-07 DIAGNOSIS — J4521 Mild intermittent asthma with (acute) exacerbation: Secondary | ICD-10-CM

## 2023-08-07 DIAGNOSIS — E785 Hyperlipidemia, unspecified: Secondary | ICD-10-CM

## 2023-08-07 DIAGNOSIS — E1142 Type 2 diabetes mellitus with diabetic polyneuropathy: Secondary | ICD-10-CM

## 2023-08-07 DIAGNOSIS — K219 Gastro-esophageal reflux disease without esophagitis: Secondary | ICD-10-CM

## 2023-08-07 DIAGNOSIS — Z7984 Long term (current) use of oral hypoglycemic drugs: Secondary | ICD-10-CM

## 2023-08-07 NOTE — Assessment & Plan Note (Signed)
 Chronic condition with suboptimal control. Last A1c was 7.2, above target. Currently on Precose , Jardiance , and Januvia . Discussed Jardiance 's role in kidney protection and blood sugar control, and potential switch to Rybelsus due to concerns about foot ulcers. - Order A1c test - Continue Precose  25 mg TID - Continue Jardiance  10 mg daily - Continue Januvia  100 mg daily - Discuss potential switch to Rybelsus

## 2023-08-07 NOTE — Assessment & Plan Note (Signed)
 Associated with diabetes. Managed with losartan  and hydrochlorothiazide . Noted pill appearance change due to different manufacturer. Chronic - Continue losartan  100 mg daily - Continue hydrochlorothiazide  25 mg daily

## 2023-08-07 NOTE — Assessment & Plan Note (Signed)
Chronic, stable Managed with Zyrtec and Flonase. - Continue Zyrtec 10 mg daily - Continue Flonase nasal spray as needed

## 2023-08-07 NOTE — Patient Instructions (Addendum)
 It was a pleasure to see you today!  Thank you for choosing [facility] for your primary care.    To keep you healthy, please keep in mind the following health maintenance items that you are due for:   TETANUS BOOSTER  DIABETES EYE EXAM [facility] VACCINE  Best Wishes,   Dr. Lang Stallion [facility] [address] [place],  [place]  [address] Main: [phone]

## 2023-08-07 NOTE — Assessment & Plan Note (Signed)
 Managed with Plavix . No recent chest pain. Stent placed in leg for circulation issues. - Continue Plavix  75 mg daily

## 2023-08-07 NOTE — Assessment & Plan Note (Signed)
 Well-controlled. Uses albuterol  and Advair as needed, primarily during seasonal changes. - Continue albuterol  108 mcg as needed - Continue Advair Diskus 250/50 mcg as needed

## 2023-08-07 NOTE — Assessment & Plan Note (Signed)
 Managed with Protonix  as needed. - Continue Protonix  as needed

## 2023-08-07 NOTE — Progress Notes (Signed)
 Established patient visit   Patient: Karen Stephenson   DOB: 12/19/1955   68 y.o. Female  MRN: [id number] Visit Date: 08/07/2023  Today's healthcare provider: Rockie Agent, MD   Chief Complaint  Patient presents with   Hypertension   Diabetes    Concerns about possible side effects of Jardiance .   Subjective     HPI     Diabetes    Additional comments: Concerns about possible side effects of Jardiance .      Last edited by Anette Alan CROME, CMA on 08/07/2023  1:15 PM.       Discussed the use of AI scribe software for clinical note transcription with the patient, who gave verbal consent to proceed.  History of Present Illness   Karen Stephenson is a 68 year old female with type 2 diabetes and hypertension who presents for a chronic follow-up.  She has type 2 diabetes with diabetic polyneuropathy and is not on long-term insulin therapy. Her medications include Precose  25 mg three times a day, Jardiance  10 mg daily, and Januvia  100 mg daily. She is concerned about potential side effects of Jardiance , particularly regarding foot ulcers. Her last A1c was 7.2, and she is aiming for a lower target.  She has hypertension associated with her diabetes and is taking losartan  100 mg and hydrochlorothiazide  25 mg for blood pressure management. She noted a change in the appearance of her losartan  pills, which was confirmed to be due to a change in manufacturer.  She has a history of unstable angina and underwent a stent placement in her leg due to circulation issues. She is on Plavix  75 mg daily for this condition and reports no current chest pain.  She has obstructive sleep apnea and uses a CPAP machine. She also has hypolipidemia associated with her diabetes and is on Crestor  20 mg for cholesterol management.  She experiences allergic rhinitis and asthma, for which she uses Zyrtec 10 mg daily, Flonase  nasal spray, and Advair Discus 250/50 mcg as needed. Her asthma is  well-controlled, with symptoms primarily during seasonal changes.  She has a history of postmenopausal estrogen deficiency and has not had a bone density scan before.  She has a history of IBS and uses hyoscyamine  as needed for flare-ups.  She is a caregiver for her husband, which has been a source of stress, but reports that his condition is improving, allowing her to consider returning to work part-time.     RECOMMEND TETANUS VACCINE, LAST IN 2012    Past Medical History:  Diagnosis Date   Allergy    Anginal pain (HCC)    a. 07/2020 MV: No ischemia/infarct-->low risk.   Asthma    Cataract    I seem to remember Dr. Carolee mentioned a very small cararact during one of my visits.   Diabetes mellitus without complication (HCC)    Family history of adverse reaction to anesthesia    SISTER HAS ISSUE DURING COLONOSCOPY   GERD (gastroesophageal reflux disease)    History of echocardiogram    a. 07/2020 Echo: EF 60-65%, no rwma, nl RV fxn, mild Ao sclerosis w/o stenosis.   Hypercholesteremia    Hypertension    Hypokalemia    Neuromuscular disorder (HCC)    Neuropathy diabetes related   PAD (peripheral artery disease) (HCC)    a. 08/2021 LE Angio/PTA: Heavily calcified short occlusion of the R CIA (s/p balloon expandable covered stent), mild nonobstructive infrainguinal disease.  Mild left SFA disease.  Borderline significant L popliteal disease with three-vessel runoff below the knee.   S/P bilateral salpingo-oophorectomy 08/18/2020   S/P laparoscopic hysterectomy 08/18/2020   Sleep apnea    WAS NOT GIVEN CPAP    Medications: Outpatient Medications Prior to Visit  Medication Sig   acarbose  (PRECOSE ) 25 MG tablet TAKE 1 TABLET BY MOUTH THREE TIMES DAILY WITH MEALS   albuterol  (VENTOLIN  HFA) 108 (90 Base) MCG/ACT inhaler Inhale 2 puffs into the lungs every 6 (six) hours as needed for wheezing or shortness of breath.   cetirizine (ZYRTEC) 10 MG tablet Take 10 mg by mouth daily.    clopidogrel  (PLAVIX ) 75 MG tablet Take 1 tablet by mouth once daily   empagliflozin  (JARDIANCE ) 10 MG TABS tablet Take 1 tablet (10 mg total) by mouth daily before breakfast.   fluticasone  (FLONASE ) 50 MCG/ACT nasal spray Place 1 spray into both nostrils daily.   fluticasone -salmeterol (ADVAIR DISKUS) 250-50 MCG/ACT AEPB Inhale 1 puff into the lungs in the morning and at bedtime.   glucose blood test strip Check sugar once daily, E 11.9   Homeopathic Products (LEG CRAMP RELIEF PO) Take 2 capsules by mouth daily.    hydrochlorothiazide  (HYDRODIURIL ) 25 MG tablet Take 1 tablet (25 mg total) by mouth daily.   JANUVIA  100 MG tablet Take 1 tablet by mouth once daily   Lancets (ONETOUCH ULTRASOFT) lancets Check sugar once daily ,DX E11.9   losartan  (COZAAR ) 100 MG tablet Take 1 tablet by mouth once daily   montelukast  (SINGULAIR ) 10 MG tablet Take 1 tablet (10 mg total) by mouth at bedtime.   Multiple Vitamins-Minerals (MULTIVITAMIN WITH MINERALS) tablet Take 1 tablet by mouth daily.   mupirocin  ointment (BACTROBAN ) 2 % Apply 1 Application topically 2 (two) times daily.   pantoprazole  (PROTONIX ) 40 MG tablet Take 1 tablet (40 mg total) by mouth daily.   rosuvastatin  (CRESTOR ) 20 MG tablet Take 1 tablet (20 mg total) by mouth daily.   vitamin B-12 (CYANOCOBALAMIN) 1000 MCG tablet Take 1,000 mcg by mouth daily.   Vitamin D , Cholecalciferol , 50 MCG (2000 UT) CAPS Take 2,000 Units by mouth daily.   zinc gluconate 50 MG tablet Take 50 mg by mouth daily.   hyoscyamine  (LEVBID ) 0.375 MG 12 hr tablet Take 1 tablet (0.375 mg total) by mouth as needed. (Patient not taking: Reported on 08/07/2023)   [DISCONTINUED] cephALEXin  (KEFLEX ) 500 MG capsule Take 1 capsule (500 mg total) by mouth 3 (three) times daily.   No facility-administered medications prior to visit.    Review of Systems  Last metabolic panel Lab Results  Component Value Date   GLUCOSE 210 (H) 05/08/2023   NA 140 05/08/2023   K 4.2  05/08/2023   CL 105 05/08/2023   CO2 23 05/08/2023   BUN 12 05/08/2023   CREATININE 0.72 05/08/2023   EGFR 90 05/08/2023   CALCIUM  9.7 05/08/2023   PROT 7.3 06/08/2020   ALBUMIN 4.2 06/08/2020   LABGLOB 3.1 06/08/2020   AGRATIO 1.4 06/08/2020   BILITOT 0.6 06/08/2020   ALKPHOS 71 06/08/2020   AST 24 06/08/2020   ALT 24 06/08/2020   Last lipids Lab Results  Component Value Date   CHOL 117 11/22/2021   HDL 36 (L) 11/22/2021   LDLCALC 58 11/22/2021   TRIG 127 11/22/2021   CHOLHDL 3.3 11/22/2021   Last hemoglobin A1c Lab Results  Component Value Date   HGBA1C 7.2 (H) 05/08/2023        Objective    BP 127/71  Pulse 65   Resp 18   Ht 5' 8 (1.727 m)   Wt 252 lb 4.8 oz (114.4 kg)   SpO2 99%   BMI 38.36 kg/m   BP Readings from Last 3 Encounters:  08/07/23 127/71  05/08/23 (!) 138/59  09/21/22 130/68   Wt Readings from Last 3 Encounters:  08/07/23 252 lb 4.8 oz (114.4 kg)  05/08/23 260 lb (117.9 kg)  11/21/22 252 lb (114.3 kg)        Physical Exam Vitals reviewed.  Constitutional:      General: She is not in acute distress.    Appearance: Normal appearance. She is not ill-appearing, toxic-appearing or diaphoretic.  Eyes:     Conjunctiva/sclera: Conjunctivae normal.  Cardiovascular:     Rate and Rhythm: Normal rate and regular rhythm.     Pulses: Normal pulses.     Heart sounds: Normal heart sounds. No murmur heard.    No friction rub. No gallop.  Pulmonary:     Effort: Pulmonary effort is normal. No respiratory distress.     Breath sounds: Normal breath sounds. No stridor. No wheezing, rhonchi or rales.  Abdominal:     General: Bowel sounds are normal. There is no distension.     Palpations: Abdomen is soft.     Tenderness: There is no abdominal tenderness.  Musculoskeletal:     Right lower leg: No edema.     Left lower leg: No edema.  Skin:    Findings: No erythema or rash.  Neurological:     Mental Status: She is alert and oriented to  person, place, and time.  Psychiatric:        Mood and Affect: Mood and affect normal.        Speech: Speech normal.        Behavior: Behavior normal. Behavior is cooperative.       No results found for any visits on 08/07/23.  Assessment & Plan     Problem List Items Addressed This Visit       Cardiovascular and Mediastinum   Unstable angina pectoris (HCC)   Managed with Plavix . No recent chest pain. Stent placed in leg for circulation issues. - Continue Plavix  75 mg daily      Essential (primary) hypertension   Associated with diabetes. Managed with losartan  and hydrochlorothiazide . Noted pill appearance change due to different manufacturer. Chronic - Continue losartan  100 mg daily - Continue hydrochlorothiazide  25 mg daily        Respiratory   Obstructive apnea   Allergic rhinitis, seasonal   Chronic, stable Managed with Zyrtec and Flonase . - Continue Zyrtec 10 mg daily - Continue Flonase  nasal spray as needed      Airway hyperreactivity   Well-controlled. Uses albuterol  and Advair as needed, primarily during seasonal changes. - Continue albuterol  108 mcg as needed - Continue Advair Diskus 250/50 mcg as needed        Digestive   Acid reflux   Managed with Protonix  as needed. - Continue Protonix  as needed        Endocrine   Hyperlipidemia associated with type 2 diabetes mellitus (HCC)   Associated with type 2 diabetes. Managed with Crestor . Chronic - Continue Crestor  20 mg daily      Diabetes mellitus, type 2 (HCC) - Primary   Chronic condition with suboptimal control. Last A1c was 7.2, above target. Currently on Precose , Jardiance , and Januvia . Discussed Jardiance 's role in kidney protection and blood sugar control, and potential switch to Rybelsus due  to concerns about foot ulcers. - Order A1c test - Continue Precose  25 mg TID - Continue Jardiance  10 mg daily - Continue Januvia  100 mg daily - Discuss potential switch to Rybelsus      Relevant  Orders   Hemoglobin A1c   Other Visit Diagnoses       Postmenopausal estrogen deficiency       Relevant Orders   DG Bone Density     Encounter for screening mammogram for malignant neoplasm of breast       Relevant Orders   MM 3D SCREENING MAMMOGRAM BILATERAL BREAST        General Health Maintenance Routine screenings and preventive care. - Order mammogram - Schedule eye exam Increased risk for osteoporosis. No prior bone density scan. Discussed importance of early detection to prevent fractures. Medicare covers DEXA scan every two years post-menopause. - Order DEXA scan    Return in about 4 months (around 12/16/2023) for DM, HTN.         Rockie Agent, MD  [facility] [phone] (phone) [phone] (fax)  [facility]

## 2023-08-07 NOTE — Assessment & Plan Note (Signed)
 Associated with type 2 diabetes. Managed with Crestor . Chronic - Continue Crestor  20 mg daily

## 2023-08-08 ENCOUNTER — Encounter: Payer: Self-pay | Admitting: Family Medicine

## 2023-08-08 LAB — HEMOGLOBIN A1C
Est. average glucose Bld gHb Est-mCnc: 169 mg/dL
Hgb A1c MFr Bld: 7.5 % — ABNORMAL HIGH (ref 4.8–5.6)

## 2023-08-14 ENCOUNTER — Encounter: Payer: Self-pay | Admitting: Podiatry

## 2023-08-14 ENCOUNTER — Ambulatory Visit: Payer: Medicare PPO | Admitting: Podiatry

## 2023-08-14 DIAGNOSIS — L97511 Non-pressure chronic ulcer of other part of right foot limited to breakdown of skin: Secondary | ICD-10-CM

## 2023-08-14 NOTE — Progress Notes (Signed)
She presents today for follow-up of her ulceration subfifth bilateral and IP joint hallux bilateral.  She states they seem to be doing better she denies fever chills nausea right muscle aches and pains states that she is doing better with her A1c.  Objective: Vital signs are stable she is alert oriented x 3.  Pulses are palpable.  Ulcerations were debrided today to bleeding subfifth bilateral.  No signs of complications no infections.  Assessment: Diabetic ulceration bilateral foot slowly resolving.  Plan: I will follow-up with her in about 3 weeks for another debridement and placed padding today.

## 2023-08-16 ENCOUNTER — Other Ambulatory Visit: Payer: Self-pay | Admitting: Family Medicine

## 2023-08-16 DIAGNOSIS — E119 Type 2 diabetes mellitus without complications: Secondary | ICD-10-CM

## 2023-08-21 ENCOUNTER — Inpatient Hospital Stay: Admission: RE | Admit: 2023-08-21 | Payer: Medicare PPO | Source: Ambulatory Visit

## 2023-08-21 ENCOUNTER — Other Ambulatory Visit: Payer: Medicare PPO

## 2023-09-04 ENCOUNTER — Ambulatory Visit
Admission: RE | Admit: 2023-09-04 | Discharge: 2023-09-04 | Disposition: A | Discharge status: Home / Self Care | Payer: Medicare PPO | Source: Ambulatory Visit | Attending: Family Medicine | Admitting: Family Medicine

## 2023-09-04 DIAGNOSIS — Z780 Asymptomatic menopausal state: Secondary | ICD-10-CM | POA: Diagnosis present

## 2023-09-04 DIAGNOSIS — Z1231 Encounter for screening mammogram for malignant neoplasm of breast: Secondary | ICD-10-CM | POA: Diagnosis present

## 2023-09-05 ENCOUNTER — Encounter: Payer: Self-pay | Admitting: Family Medicine

## 2023-09-11 ENCOUNTER — Ambulatory Visit: Payer: Medicare PPO | Admitting: Podiatry

## 2023-09-11 ENCOUNTER — Encounter: Payer: Self-pay | Admitting: Podiatry

## 2023-09-11 DIAGNOSIS — L97511 Non-pressure chronic ulcer of other part of right foot limited to breakdown of skin: Secondary | ICD-10-CM

## 2023-09-11 DIAGNOSIS — E1142 Type 2 diabetes mellitus with diabetic polyneuropathy: Secondary | ICD-10-CM

## 2023-09-11 NOTE — Progress Notes (Signed)
 She presents today for follow-up of her ulcerations subfifth bilateral and IPJ right.  She states that she has been taking care of them as well as she can try to keep her blood sugar under good control.  Objective: Pulses are palpable.  Diabetic ulceration subfifth metatarsal is worse with open lesion.  The remainder are not open.  This does not probe deep to capsule however it does bleed at subcutaneous tissue.  Assessment: Diabetic ulceration forefoot bilateral.  Plan: Debridement of wounds today dressing of wounds today follow-up with her in the near future for reevaluation.

## 2023-09-24 ENCOUNTER — Other Ambulatory Visit: Payer: Medicare PPO

## 2023-09-30 ENCOUNTER — Telehealth: Admitting: Emergency Medicine

## 2023-09-30 ENCOUNTER — Ambulatory Visit
Admission: EM | Admit: 2023-09-30 | Discharge: 2023-09-30 | Disposition: A | Discharge status: Home / Self Care | Attending: Emergency Medicine | Admitting: Emergency Medicine

## 2023-09-30 DIAGNOSIS — J069 Acute upper respiratory infection, unspecified: Secondary | ICD-10-CM | POA: Insufficient documentation

## 2023-09-30 DIAGNOSIS — J4521 Mild intermittent asthma with (acute) exacerbation: Secondary | ICD-10-CM

## 2023-09-30 DIAGNOSIS — J302 Other seasonal allergic rhinitis: Secondary | ICD-10-CM | POA: Diagnosis not present

## 2023-09-30 LAB — SARS CORONAVIRUS 2 BY RT PCR: SARS Coronavirus 2 by RT PCR: NEGATIVE

## 2023-09-30 MED ORDER — IPRATROPIUM BROMIDE 0.06 % NA SOLN: 2.0000 | Freq: Four times a day (QID) | NASAL | 12 refills | Status: DC

## 2023-09-30 MED ORDER — BENZONATATE 100 MG PO CAPS: 200.0000 mg | ORAL_CAPSULE | Freq: Three times a day (TID) | ORAL | 0 refills | Status: DC

## 2023-09-30 MED ORDER — FLUTICASONE-SALMETEROL 250-50 MCG/ACT IN AEPB: 1.0000 | INHALATION_SPRAY | Freq: Two times a day (BID) | RESPIRATORY_TRACT | 0 refills | Status: DC

## 2023-09-30 MED ORDER — PROMETHAZINE-DM 6.25-15 MG/5ML PO SYRP
5.0000 mL | ORAL_SOLUTION | Freq: Four times a day (QID) | ORAL | 0 refills | Status: DC | PRN
Start: 2023-09-30 — End: 2023-10-23

## 2023-09-30 NOTE — Progress Notes (Signed)
 E visit for Allergic Rhinitis We are sorry that you are not feeling well.  Here is how we plan to help!  Based on what you have shared with me it looks like you have Allergic Rhinitis.  Rhinitis is when a reaction occurs that causes nasal congestion, runny nose, sneezing, and itching.  Most types of rhinitis are caused by an inflammation and are associated with symptoms in the eyes ears or throat. There are several types of rhinitis.  The most common are acute rhinitis, which is usually caused by a viral illness, allergic or seasonal rhinitis, and nonallergic or year-round rhinitis.  Nasal allergies occur certain times of the year.  Allergic rhinitis is caused when allergens in the air trigger the release of histamine in the body.  Histamine causes itching, swelling, and fluid to build up in the fragile linings of the nasal passages, sinuses and eyelids.  An itchy nose and clear discharge are common.  I recommend the following over the counter treatments: Continue taking Zyrtec 10mg  daily (ok to use generic cetirizine)  I also would recommend a nasal spray: Nasonex 2 sprays into each nostril once daily  You may also benefit from eye drops such as: Patanol or Naphcon A (ok to use generic or store brand versions of these)  I have refilled your advair for one time. Please follow up with your primary care provider for further refills.   HOME CARE:  You can use an over-the-counter saline nasal spray as needed Avoid areas where there is heavy dust, mites, or molds Stay indoors on windy days during the pollen season Keep windows closed in home, at least in bedroom; use air conditioner. Use high-efficiency house air filter Keep windows closed in car, turn AC on re-circulate Avoid playing out with dog during pollen season  GET HELP RIGHT AWAY IF:  If your symptoms do not improve within 10 days You become short of breath You develop yellow or green discharge from your nose for over 3 days You  have coughing fits  MAKE SURE YOU:  Understand these instructions Will watch your condition Will get help right away if you are not doing well or get worse  Thank you for choosing an e-visit. Your e-visit answers were reviewed by a board certified advanced clinical practitioner to complete your personal care plan. Depending upon the condition, your plan could have included both over the counter or prescription medications. Please review your pharmacy choice. Be sure that the pharmacy you have chosen is open so that you can pick up your prescription now.  If there is a problem you may message your provider in MyChart to have the prescription routed to another pharmacy. Your safety is important to [place]. If you have drug allergies check your prescription carefully.  For the next 24 hours, you can use MyChart to ask questions about today's visit, request a non-urgent call back, or ask for a work or school excuse from your e-visit provider. You will get an email in the next two days asking about your experience. I hope that your e-visit has been valuable and will speed your recovery.  I have spent 5 minutes in review of e-visit questionnaire, review and updating patient chart, medical decision making and response to patient.   Rica Mast, PhD, FNP-BC

## 2023-09-30 NOTE — ED Triage Notes (Signed)
 Sore throat started Saturday SOB started today, patient did evist and was sent in a advir disk. Patient has hx of asthma  Cough started sat night. Productive yellow mucus. Patient states that she hasn't had any fever.

## 2023-09-30 NOTE — Discharge Instructions (Addendum)
 Your COVID test today was negative.  Your physical exam is consistent with a viral upper respiratory infection.  Continue to use your albuterol every 4-6 hours as needed for any shortness of breath or wheezing.  Resume using your Advair twice daily to help decrease pulmonary inflammation.  Use the Atrovent nasal spray, 2 squirts in each nostril every 6 hours, as needed for runny nose and postnasal drip.  Use the Tessalon Perles every 8 hours during the day.  Take them with a small sip of water.  They may give you some numbness to the base of your tongue or a metallic taste in your mouth, this is normal.  Use the Promethazine DM cough syrup at bedtime for cough and congestion.  It will make you drowsy so do not take it during the day.  Return for reevaluation or see your primary care provider for any new or worsening symptoms.

## 2023-09-30 NOTE — ED Provider Notes (Signed)
 [facility]    CSN: [id number] Arrival date & time: 09/30/23  1818      History   Chief Complaint Chief Complaint  Patient presents with   Cough   Shortness of Breath   Sore Throat    HPI Karen Stephenson is a 68 y.o. female.   HPI  Patient 68 year old female with past medical history significant for asthma, hypertension, GERD, high cholesterol, angina, diabetes, PAD, sleep apnea, and seasonal allergies presents for evaluation of respiratory symptoms that began 2 days ago and include runny nose and nasal congestion feel nasal discharge, right ear pain, sore throat, productive cough for yellow sputum, and shortness of breath.  She denies any fever or wheezing.  Past Medical History:  Diagnosis Date   Allergy    Anginal pain (HCC)    a. 07/2020 MV: No ischemia/infarct-->low risk.   Asthma    Cataract    I seem to remember Dr. Alvester Morin mentioned a very small cararact during one of my visits.   Diabetes mellitus without complication (HCC)    Family history of adverse reaction to anesthesia    SISTER HAS ISSUE DURING COLONOSCOPY   GERD (gastroesophageal reflux disease)    History of echocardiogram    a. 07/2020 Echo: EF 60-65%, no rwma, nl RV fxn, mild Ao sclerosis w/o stenosis.   Hypercholesteremia    Hypertension    Hypokalemia    Neuromuscular disorder (HCC)    Neuropathy diabetes related   PAD (peripheral artery disease) (HCC)    a. 08/2021 LE Angio/PTA: Heavily calcified short occlusion of the R CIA (s/p balloon expandable covered stent), mild nonobstructive infrainguinal disease.  Mild left SFA disease.  Borderline significant L popliteal disease with three-vessel runoff below the knee.   S/P bilateral salpingo-oophorectomy 08/18/2020   S/P laparoscopic hysterectomy 08/18/2020   Sleep apnea    WAS NOT GIVEN CPAP    Patient Active Problem List   Diagnosis Date Noted   Healthcare maintenance 08/29/2022   Prolapse of female pelvic organs 08/02/2020   Diabetic  foot ulcer (HCC) 01/12/2019   Chronic venous insufficiency 12/01/2018   Lower extremity ulceration, unspecified laterality, with fat layer exposed (HCC) 12/01/2018   Airway hyperreactivity 12/13/2014   Acid reflux 12/13/2014   Hyperlipidemia associated with type 2 diabetes mellitus (HCC) 12/13/2014   Extreme obesity 12/13/2014   Avitaminosis D 12/13/2014   Dermatophytic onychia 12/24/2013   Bony exostosis 12/24/2013   Unstable angina pectoris (HCC) 12/24/2013   Obstructive apnea 12/26/2007   Diaphragmatic hernia 10/08/2006   Allergic rhinitis, seasonal 10/08/2006   Diabetes mellitus, type 2 (HCC) 10/08/2006   Essential (primary) hypertension 12/29/1999    Past Surgical History:  Procedure Laterality Date   ABDOMINAL AORTOGRAM W/LOWER EXTREMITY N/A 08/30/2021   Procedure: ABDOMINAL AORTOGRAM W/LOWER EXTREMITY;  Surgeon: Iran Ouch, MD;  Location: [facility];  Service: Cardiovascular;  Laterality: N/A;   ABDOMINAL HYSTERECTOMY  08/16/2020   Dr. Tretha Sciara OBGYN   BACK SURGERY  1986   COLONOSCOPY WITH PROPOFOL N/A 07/17/2017   Procedure: COLONOSCOPY WITH PROPOFOL;  Surgeon: Scot Jun, MD;  Location: [facility];  Service: Endoscopy;  Laterality: N/A;   NASAL SINUS SURGERY     PERIPHERAL VASCULAR INTERVENTION Right 08/30/2021   Procedure: PERIPHERAL VASCULAR INTERVENTION;  Surgeon: Iran Ouch, MD;  Location: [facility];  Service: Cardiovascular;  Laterality: Right;  common Iliac   TOTAL LAPAROSCOPIC HYSTERECTOMY WITH BILATERAL SALPINGO OOPHORECTOMY Bilateral 08/02/2020   Procedure: TOTAL LAPAROSCOPIC  HYSTERECTOMY WITH BILATERAL SALPINGO OOPHORECTOMY;  Surgeon: Nadara Mustard, MD;  Location: [facility];  Service: Gynecology;  Laterality: Bilateral;  RN TO ASSIST   UTERINE FIBROID SURGERY      OB History     Gravida  0   Para  0   Term  0   Preterm  0   AB  0   Living  0      SAB  0   IAB  0   Ectopic  0   Multiple  0    Live Births  0            Home Medications    Prior to Admission medications   Medication Sig Start Date End Date Taking? Authorizing Provider  benzonatate (TESSALON) 100 MG capsule Take 2 capsules (200 mg total) by mouth every 8 (eight) hours. 09/30/23  Yes Becky Augusta, NP  cetirizine (ZYRTEC) 10 MG tablet Take 10 mg by mouth daily. 04/24/10  Yes [provider]  fluticasone (FLONASE) 50 MCG/ACT nasal spray Place 1 spray into both nostrils daily. 02/04/23  Yes Simmons-Robinson, Makiera, MD  fluticasone-salmeterol (ADVAIR DISKUS) 250-50 MCG/ACT AEPB Inhale 1 puff into the lungs in the morning and at bedtime. 09/30/23  Yes Cathlyn Parsons, NP  hydrochlorothiazide (HYDRODIURIL) 25 MG tablet Take 1 tablet (25 mg total) by mouth daily. 05/08/23  Yes Simmons-Robinson, Makiera, MD  ipratropium (ATROVENT) 0.06 % nasal spray Place 2 sprays into both nostrils 4 (four) times daily. 09/30/23  Yes Becky Augusta, NP  JANUVIA 100 MG tablet Take 1 tablet by mouth once daily 08/16/23  Yes Simmons-Robinson, Tawanna Cooler, MD  losartan (COZAAR) 100 MG tablet Take 1 tablet by mouth once daily 07/30/23  Yes Simmons-Robinson, Makiera, MD  montelukast (SINGULAIR) 10 MG tablet Take 1 tablet (10 mg total) by mouth at bedtime. 07/10/22  Yes Simmons-Robinson, Makiera, MD  promethazine-dextromethorphan (PROMETHAZINE-DM) 6.25-15 MG/5ML syrup Take 5 mLs by mouth 4 (four) times daily as needed. 09/30/23  Yes Becky Augusta, NP  rosuvastatin (CRESTOR) 20 MG tablet Take 1 tablet (20 mg total) by mouth daily. 07/09/22  Yes Simmons-Robinson, Makiera, MD  acarbose (PRECOSE) 25 MG tablet TAKE 1 TABLET BY MOUTH THREE TIMES DAILY WITH MEALS 07/15/23   Simmons-Robinson, Tawanna Cooler, MD  albuterol (VENTOLIN HFA) 108 (90 Base) MCG/ACT inhaler Inhale 2 puffs into the lungs every 6 (six) hours as needed for wheezing or shortness of breath. 10/26/21   Drubel, Lillia Abed, PA-C  clopidogrel (PLAVIX) 75 MG tablet Take 1 tablet by mouth once daily 07/15/23    Iran Ouch, MD  empagliflozin (JARDIANCE) 10 MG TABS tablet Take 1 tablet (10 mg total) by mouth daily before breakfast. 05/08/23   Simmons-Robinson, Makiera, MD  glucose blood test strip Check sugar once daily, E 11.9 05/02/16   Bosie Clos, MD  Homeopathic Products (LEG CRAMP RELIEF PO) Take 2 capsules by mouth daily.     [provider]  hyoscyamine (LEVBID) 0.375 MG 12 hr tablet Take 1 tablet (0.375 mg total) by mouth as needed. Patient not taking: Reported on 08/07/2023 07/24/17   Bosie Clos, MD  Lancets [facility] ULTRASOFT) lancets Check sugar once daily ,DX E11.9 06/01/16   Bosie Clos, MD  Multiple Vitamins-Minerals (MULTIVITAMIN WITH MINERALS) tablet Take 1 tablet by mouth daily.    [provider]  mupirocin ointment (BACTROBAN) 2 % Apply 1 Application topically 2 (two) times daily. 06/17/23   Hyatt, Max T, DPM  pantoprazole (PROTONIX) 40 MG tablet Take 1  tablet (40 mg total) by mouth daily. 01/14/23   Simmons-Robinson, Makiera, MD  vitamin B-12 (CYANOCOBALAMIN) 1000 MCG tablet Take 1,000 mcg by mouth daily.    [provider]  Vitamin D, Cholecalciferol, 50 MCG (2000 UT) CAPS Take 2,000 Units by mouth daily.    [provider]  zinc gluconate 50 MG tablet Take 50 mg by mouth daily.    [provider]    Family History Family History  Problem Relation Age of Onset   Diabetes Mother    Breast cancer Mother 33   Arthritis Mother    Cancer Mother    Heart disease Father    Alcohol abuse Father    Breast cancer Sister 46       dx twice    Cancer Sister    Heart disease Paternal Grandfather    Hypertension Brother    Colon cancer Neg Hx    Ovarian cancer Neg Hx     Social History Social History   Tobacco Use   Smoking status: Former    Current packs/day: 0.00    Types: Cigarettes    Quit date: 07/01/2013    Years since quitting: 10.2   Smokeless tobacco: Never  Vaping Use   Vaping status: Never Used   Substance Use Topics   Alcohol use: No   Drug use: No     Allergies   Amoxicillin-pot clavulanate, Aspirin, Ducodyl [bisacodyl], Erythromycin, Glipizide, Metformin hcl, Latex, and Macrobid [nitrofurantoin]   Review of Systems Review of Systems  Constitutional:  Negative for fever.  HENT:  Positive for congestion, ear pain, rhinorrhea and sore throat.   Respiratory:  Positive for cough and shortness of breath. Negative for wheezing.      Physical Exam Triage Vital Signs ED Triage Vitals [09/30/23 1830]  Encounter Vitals Group     BP (!) 176/80     Systolic BP Percentile      Diastolic BP Percentile      Pulse Rate 76     Resp 16     Temp 97.9 F (36.6 C)     Temp Source Oral     SpO2 98 %     Weight      Height      Head Circumference      Peak Flow      Pain Score      Pain Loc      Pain Education      Exclude from Growth Chart    No data found.  Updated Vital Signs BP (!) 176/80 (BP Location: Left Wrist)   Pulse 76   Temp 97.9 F (36.6 C) (Oral)   Resp 16   SpO2 98%   Visual Acuity Right Eye Distance:   Left Eye Distance:   Bilateral Distance:    Right Eye Near:   Left Eye Near:    Bilateral Near:     Physical Exam Vitals and nursing note reviewed.  Constitutional:      Appearance: Normal appearance. She is not ill-appearing.  HENT:     Head: Normocephalic and atraumatic.     Right Ear: Tympanic membrane, ear canal and external ear normal. There is no impacted cerumen.     Left Ear: Tympanic membrane, ear canal and external ear normal. There is no impacted cerumen.     Nose: Congestion and rhinorrhea present.     Comments: Wendall Papa is edematous and erythematous with yellow discharge in both nares.    Mouth/Throat:  Mouth: Mucous membranes are moist.     Pharynx: Oropharynx is clear. No oropharyngeal exudate or posterior oropharyngeal erythema.  Cardiovascular:     Rate and Rhythm: Normal rate and regular rhythm.     Pulses: Normal pulses.      Heart sounds: Normal heart sounds. No murmur heard.    No friction rub. No gallop.  Pulmonary:     Effort: Pulmonary effort is normal.     Breath sounds: Normal breath sounds. No wheezing, rhonchi or rales.  Musculoskeletal:     Cervical back: Normal range of motion and neck supple. No tenderness.  Lymphadenopathy:     Cervical: No cervical adenopathy.  Skin:    General: Skin is warm and dry.     Capillary Refill: Capillary refill takes less than 2 seconds.     Findings: No rash.  Neurological:     General: No focal deficit present.     Mental Status: She is alert and oriented to person, place, and time.      UC Treatments / Results  Labs (all labs ordered are listed, but only abnormal results are displayed) Labs Reviewed  SARS CORONAVIRUS 2 BY RT PCR    EKG   Radiology No results found.  Procedures Procedures (including critical care time)  Medications Ordered in UC Medications - No data to display  Initial Impression / Assessment and Plan / UC Course  I have reviewed the triage vital signs and the nursing notes.  Pertinent labs & imaging results that were available during my care of the patient were reviewed by me and considered in my medical decision making (see chart for details).   Patient is a pleasant, nontoxic-appearing 68 year old female presenting for evaluation of respiratory symptoms as outlined HPI above.  She does have a history of asthma but states she does not use her Advair year-round.  Her Advair had expired so she called her PCP who sent her in a new prescription today.  She reports that she only uses her Advair on an as-needed basis.  She also has an albuterol inhaler and reports that she did need to use that this morning for shortness of breath.  Her physical exam does reveal inflammation of her upper respiratory tract as evidenced by inflamed nasal mucosa with yellow nasal discharge in both nares.  Her oropharyngeal exam is benign.  No cervical  lymphadenopathy present.  Cardiopulmonary exam physical lung sounds in all fields.  Differential diagnose include COVID, influenza, viral respiratory illness.  Due to the fact that she has had symptoms for the last 3 days she is outside the therapeutic window for Tamiflu so I will not test her for influenza at this time.  I will order a COVID PCR.  Patient has taken home COVID test that was negative.  COVID PCR is negative.  I will discharge patient home with a diagnosis of viral URI with a cough.  I will prescribe Atrovent nasal spray to help with the nasal congestion.  Tessalon Perles and Promethazine DM cough syrup for cough and congestion.  She should continue to use her albuterol inhaler as needed for any shortness of breath or wheezing.  She should resume using her Advair discus twice daily to help with pulmonary inflammation.  Return precautions reviewed.  Final Clinical Impressions(s) / UC Diagnoses   Final diagnoses:  Viral URI with cough     Discharge Instructions      Your COVID test today was negative.  Your physical exam is consistent with  a viral upper respiratory infection.  Continue to use your albuterol every 4-6 hours as needed for any shortness of breath or wheezing.  Resume using your Advair twice daily to help decrease pulmonary inflammation.  Use the Atrovent nasal spray, 2 squirts in each nostril every 6 hours, as needed for runny nose and postnasal drip.  Use the Tessalon Perles every 8 hours during the day.  Take them with a small sip of water.  They may give you some numbness to the base of your tongue or a metallic taste in your mouth, this is normal.  Use the Promethazine DM cough syrup at bedtime for cough and congestion.  It will make you drowsy so do not take it during the day.  Return for reevaluation or see your primary care provider for any new or worsening symptoms.      ED Prescriptions     Medication Sig Dispense Auth. Provider   benzonatate  (TESSALON) 100 MG capsule Take 2 capsules (200 mg total) by mouth every 8 (eight) hours. 21 capsule Becky Augusta, NP   ipratropium (ATROVENT) 0.06 % nasal spray Place 2 sprays into both nostrils 4 (four) times daily. 15 mL Becky Augusta, NP   promethazine-dextromethorphan (PROMETHAZINE-DM) 6.25-15 MG/5ML syrup Take 5 mLs by mouth 4 (four) times daily as needed. 118 mL Becky Augusta, NP      PDMP not reviewed this encounter.   Becky Augusta, NP 09/30/23 [phone]

## 2023-10-09 ENCOUNTER — Encounter: Payer: Self-pay | Admitting: Podiatry

## 2023-10-09 ENCOUNTER — Ambulatory Visit: Admitting: Podiatry

## 2023-10-09 ENCOUNTER — Other Ambulatory Visit: Payer: Self-pay | Admitting: Cardiovascular Disease

## 2023-10-09 DIAGNOSIS — L97511 Non-pressure chronic ulcer of other part of right foot limited to breakdown of skin: Secondary | ICD-10-CM | POA: Diagnosis not present

## 2023-10-09 DIAGNOSIS — E1142 Type 2 diabetes mellitus with diabetic polyneuropathy: Secondary | ICD-10-CM | POA: Diagnosis not present

## 2023-10-09 DIAGNOSIS — M79676 Pain in unspecified toe(s): Secondary | ICD-10-CM | POA: Diagnosis not present

## 2023-10-09 DIAGNOSIS — B351 Tinea unguium: Secondary | ICD-10-CM | POA: Diagnosis not present

## 2023-10-09 NOTE — Progress Notes (Signed)
 She presents today for follow-up of her ulcerations subfifth bilateral and IPJ right.  She states that she has been taking care of them as well as she can try to keep her blood sugar under good control.  She also states that her toenails are long thick yellow dystrophic and painful.  She states that Jonetta Speak is feeling better and should be starting cardiac rehab in the near.  Objective: Pulses are palpable.  Diabetic ulceration subfifth metatarsal is worse with open lesion.  The remainder are not open.  This does not probe deep to capsule however it does bleed at subcutaneous tissue.  Long thick yellow dystrophic clinically mycotic nails  Assessment: Diabetic ulceration forefoot bilateral.  Pain limb secondary to onychomycosis diabetic peripheral neuropathy  Plan: Debridement of wounds today dressing of wounds today follow-up with her in the near future for reevaluation.  Debridement of toenails 1 through 5 bilateral.  Remember to ask if she developed Luis a signet ring.

## 2023-10-09 NOTE — Telephone Encounter (Signed)
 Hi,  Could you schedule this patient a 12 month follow up visit? The patient was last seen by Dr. Kirke Corin on 09-21-22. Thank you so much.

## 2023-10-23 ENCOUNTER — Encounter: Payer: Self-pay | Admitting: Nurse Practitioner

## 2023-10-23 ENCOUNTER — Ambulatory Visit: Attending: Nurse Practitioner | Admitting: Nurse Practitioner

## 2023-10-23 VITALS — BP 110/68 | HR 77 | Ht 68.0 in | Wt 248.0 lb

## 2023-10-23 DIAGNOSIS — I10 Essential (primary) hypertension: Secondary | ICD-10-CM | POA: Diagnosis not present

## 2023-10-23 DIAGNOSIS — E785 Hyperlipidemia, unspecified: Secondary | ICD-10-CM | POA: Diagnosis not present

## 2023-10-23 DIAGNOSIS — E1142 Type 2 diabetes mellitus with diabetic polyneuropathy: Secondary | ICD-10-CM | POA: Diagnosis not present

## 2023-10-23 DIAGNOSIS — I739 Peripheral vascular disease, unspecified: Secondary | ICD-10-CM | POA: Diagnosis not present

## 2023-10-23 NOTE — Progress Notes (Signed)
 Office Visit    Patient Name: Karen Stephenson Date of Encounter: 10/23/2023  Primary Care Provider:  Mimi Alt, MD Primary Cardiologist:  Karen Delton, MD  Chief Complaint    68 y.o. female with a history of hypertension, hyperlipidemia, diabetes, sleep apnea, chest pain, and peripheral arterial disease, who presents for follow-up related to peripheral arterial disease.  Past Medical History  Subjective   Past Medical History:  Diagnosis Date   Allergy    Anginal pain (HCC)    a. 07/2020 MV: No ischemia/infarct-->low risk.   Asthma    Cataract    I seem to remember Karen Stephenson mentioned a very small cararact during one of my visits.   Diabetes mellitus without complication (HCC)    Family history of adverse reaction to anesthesia    SISTER HAS ISSUE DURING COLONOSCOPY   GERD (gastroesophageal reflux disease)    History of echocardiogram    a. 07/2020 Echo: EF 60-65%, no rwma, nl RV fxn, mild Ao sclerosis w/o stenosis.   Hypercholesteremia    Hypertension    Hypokalemia    Neuromuscular disorder (HCC)    Neuropathy diabetes related   PAD (peripheral artery disease) (HCC)    a. 08/2021 LE Angio/PTA: Heavily calcified short occlusion of the R CIA (s/p balloon expandable covered stent), mild nonobstructive infrainguinal disease.  Mild left SFA disease.  Borderline significant L popliteal disease with three-vessel runoff below the knee.   S/P bilateral salpingo-oophorectomy 08/18/2020   S/P laparoscopic hysterectomy 08/18/2020   Sleep apnea    WAS NOT GIVEN CPAP   Past Surgical History:  Procedure Laterality Date   ABDOMINAL AORTOGRAM W/LOWER EXTREMITY N/A 08/30/2021   Procedure: ABDOMINAL AORTOGRAM W/LOWER EXTREMITY;  Surgeon: Karen Hamilton, MD;  Location: [facility];  Service: Cardiovascular;  Laterality: N/A;   ABDOMINAL HYSTERECTOMY  08/16/2020   Dr. Dixie Stephenson OBGYN   BACK SURGERY  1986   COLONOSCOPY WITH PROPOFOL  N/A 07/17/2017    Procedure: COLONOSCOPY WITH PROPOFOL ;  Surgeon: Karen Click, MD;  Location: [facility];  Service: Endoscopy;  Laterality: N/A;   NASAL SINUS SURGERY     PERIPHERAL VASCULAR INTERVENTION Right 08/30/2021   Procedure: PERIPHERAL VASCULAR INTERVENTION;  Surgeon: Karen Hamilton, MD;  Location: [facility];  Service: Cardiovascular;  Laterality: Right;  common Iliac   TOTAL LAPAROSCOPIC HYSTERECTOMY WITH BILATERAL SALPINGO OOPHORECTOMY Bilateral 08/02/2020   Procedure: TOTAL LAPAROSCOPIC HYSTERECTOMY WITH BILATERAL SALPINGO OOPHORECTOMY;  Surgeon: Karen Alma, MD;  Location: [facility] ORS;  Service: Gynecology;  Laterality: Bilateral;  RN TO ASSIST   UTERINE FIBROID SURGERY      Allergies  Allergies  Allergen Reactions   Amoxicillin -Pot Clavulanate Diarrhea   Aspirin      Higher doses - passed out    Ducodyl [Bisacodyl]     Passed out, extreme cramping    Erythromycin     Stomach Ache Stomach pain.   Glipizide     Extreme stomach aches    Metformin Hcl     Extreme stomach cramps    Latex Rash   Macrobid  [Nitrofurantoin ]     Severe diarrhea, severe abdominal pain/cramping      History of Present Illness      68 y.o. y/o female with n the setting of abnormal EKG and need for preoperative evaluation, she underwent stress testing in January 2022, which showed no evidence of ischemia or infarct. EF was calculated at 40% on stress testing and she subsequently underwent echocardiogram which showed  an EF of 60 to 65% without regional wall motion abnormalities.  In early 2023, she was referred to Dr. Alvenia Stephenson in the setting of bilateral big toe and left heel ulcerations. She also reported a 1 year history of right lower extremity claudication. ABIs were abnormal at 0.47 on the right and 0.82 on the left. She had monophasic waveforms starting at the right external iliac artery. She subsequently underwent abdominal aortic and lower extremity angiography on March 1, which revealed  heavily calcified short occlusion of the right common iliac artery with mild nonobstructive infrainguinal disease. There was mild left SFA disease with borderline significant left popliteal disease and three-vessel runoff below the knee. She underwent balloon expandable covered stenting of the right common iliac artery with good result and recommendation for at least 6 months of dual antiplatelet therapy.    Karen Stephenson was doing well when she was seen in cardiology clinic 1 year ago.  Follow-up ABIs in May 2024 were stable at 0.89 on the right and 0.94 on the left.  She has been followed by podiatry in the setting of ongoing diabet foot ulcerations s/p debridement earlier this month.  From a cardiac standpoint, she notes that she has been doing well.  Her husband has a history of heart failure and is status post LVAD.  In that setting, she has made a lot of trips to [facility] for his care and says that she has spent a lot of time on her feet, which has resulted in more pressure on her foot ulcers, resulting in poor healing.  She does not experience chest pain or dyspnea.  She denies palpitations, PND, orthopnea, dizziness, syncope, edema, or early satiety. Objective  Home Medications    Current Outpatient Medications  Medication Sig Dispense Refill   acarbose  (PRECOSE ) 25 MG tablet TAKE 1 TABLET BY MOUTH THREE TIMES DAILY WITH MEALS 270 tablet 1   albuterol  (VENTOLIN  HFA) 108 (90 Base) MCG/ACT inhaler Inhale 2 puffs into the lungs every 6 (six) hours as needed for wheezing or shortness of breath. 8 g 2   cetirizine (ZYRTEC) 10 MG tablet Take 10 mg by mouth daily.     clopidogrel  (PLAVIX ) 75 MG tablet Take 1 tablet by mouth once daily 90 tablet 0   empagliflozin  (JARDIANCE ) 10 MG TABS tablet Take 1 tablet (10 mg total) by mouth daily before breakfast. 90 tablet 1   fluticasone  (FLONASE ) 50 MCG/ACT nasal spray Place 1 spray into both nostrils daily. 16 g 11   fluticasone -salmeterol (ADVAIR DISKUS) 250-50  MCG/ACT AEPB Inhale 1 puff into the lungs in the morning and at bedtime. 60 each 0   glucose blood test strip Check sugar once daily, E 11.9 25 each 12   Homeopathic Products (LEG CRAMP RELIEF PO) Take 2 capsules by mouth daily.      hydrochlorothiazide  (HYDRODIURIL ) 25 MG tablet Take 1 tablet (25 mg total) by mouth daily. 90 tablet 1   hyoscyamine  (LEVBID) 0.375 MG 12 hr tablet Take 1 tablet (0.375 mg total) by mouth as needed. 60 tablet 5   JANUVIA  100 MG tablet Take 1 tablet by mouth once daily 90 tablet 0   Lancets (ONETOUCH ULTRASOFT) lancets Check sugar once daily ,DX E11.9 30 each 12   losartan  (COZAAR ) 100 MG tablet Take 1 tablet by mouth once daily 90 tablet 2   montelukast  (SINGULAIR ) 10 MG tablet Take 1 tablet (10 mg total) by mouth at bedtime. 90 tablet 3   Multiple Vitamins-Minerals (MULTIVITAMIN WITH MINERALS) tablet  Take 1 tablet by mouth daily.     mupirocin  ointment (BACTROBAN ) 2 % Apply 1 Application topically 2 (two) times daily. 22 g 0   pantoprazole  (PROTONIX ) 40 MG tablet Take 1 tablet (40 mg total) by mouth daily. 90 tablet 3   rosuvastatin  (CRESTOR ) 20 MG tablet Take 1 tablet (20 mg total) by mouth daily. 90 tablet 1   vitamin B-12 (CYANOCOBALAMIN) 1000 MCG tablet Take 1,000 mcg by mouth daily.     Vitamin D , Cholecalciferol , 50 MCG (2000 UT) CAPS Take 2,000 Units by mouth daily.     zinc gluconate 50 MG tablet Take 50 mg by mouth daily.     No current facility-administered medications for this visit.     Physical Exam    VS:  BP 110/68   Pulse 77   Ht 5\' 8"  (1.727 m)   Wt 248 lb (112.5 kg)   SpO2 97%   BMI 37.71 kg/m  , BMI Body mass index is 37.71 kg/m.       GEN: Well nourished, well developed, in no acute distress. HEENT: normal. Neck: Supple, no JVD, carotid bruits, or masses. Cardiac: RRR, no murmurs, rubs, or gallops. No clubbing, cyanosis, edema.  Radials 2+/PT 2+ and equal bilaterally.  Respiratory:  Respirations regular and unlabored, clear to  auscultation bilaterally. GI: Soft, nontender, nondistended, BS + x 4. MS: no deformity or atrophy. Skin: warm and dry, no rash. Neuro:  Strength and sensation are intact. Psych: Normal affect.  Accessory Clinical Findings    ECG personally reviewed by me today - EKG Interpretation Date/Time:  Wednesday October 23 2023 13:42:50 EDT Ventricular Rate:  77 PR Interval:  190 QRS Duration:  106 QT Interval:  408 QTC Calculation: 461 R Axis:   -38  Text Interpretation: Normal sinus rhythm Left axis deviation Minimal voltage criteria for LVH, may be normal variant ( Cornell product ) Anterolateral infarct , age undetermined Confirmed by Laneta Pintos [phone]) on 10/23/2023 2:01:43 PM  - no acute changes.  Lab Results  Component Value Date   WBC 6.6 08/23/2021   HGB 13.8 08/23/2021   HCT 42.1 08/23/2021   MCV 84 08/23/2021   PLT 260 08/23/2021   Lab Results  Component Value Date   CREATININE 0.72 05/08/2023   BUN 12 05/08/2023   NA 140 05/08/2023   K 4.2 05/08/2023   CL 105 05/08/2023   CO2 23 05/08/2023   Lab Results  Component Value Date   Stephenson 24 06/08/2020   AST 24 06/08/2020   ALKPHOS 71 06/08/2020   BILITOT 0.6 06/08/2020   Lab Results  Component Value Date   CHOL 117 11/22/2021   HDL 36 (L) 11/22/2021   LDLCALC 58 11/22/2021   TRIG 127 11/22/2021   CHOLHDL 3.3 11/22/2021    Lab Results  Component Value Date   HGBA1C 7.5 (H) 08/07/2023   Lab Results  Component Value Date   TSH 2.450 11/22/2021       Assessment & Plan    1.  Peripheral arterial disease: Status post balloon expandable covered stenting of the right common iliac artery in 2023.  Stable ABIs in May 2024.  She has not been having any significant claudication.  She does have slow healing diabetic ulcerations on her feet, which she attributes to having to be on her feet a lot more often in the past year in an effort to take care of her husband who has an LVAD and has been getting treatment at  [facility].  She is followed by podiatry and notes that since her last visit, the ulcerations are healing better.  She is due for repeat ABIs which I will arrange.  She remains on Plavix  and statin therapy.  2.  Primary hypertension: Blood pressure stable at 110/68.  She remains on ARB and diuretic therapy.  3.  Hyperlipidemia: Last LDL was 58 in 2023.  She is pending annual physical with primary care and expects to have repeat lipids at that time.  She remains on statin therapy.  4.  Type 2 diabetes mellitus: A1c was 7.5 earlier this year.  She is on Januvia  and Jardiance  as well as ARB and statin.  Followed by primary care.  5.  Disposition: Follow-up ABIs.  Follow-up in clinic in 1 year or sooner if necessary.  Laneta Pintos, NP 10/23/2023, 5:35 PM

## 2023-10-23 NOTE — Patient Instructions (Signed)
 Medication Instructions:  No changes *If you need a refill on your cardiac medications before your next appointment, please call your pharmacy*  Lab Work: None ordered If you have labs (blood work) drawn today and your tests are completely normal, you will receive your results only by: MyChart Message (if you have MyChart) OR A paper copy in the mail If you have any lab test that is abnormal or we need to change your treatment, we will call you to review the results.  Testing/Procedures: Your physician has recommended that you have an AORTA/ILIAC Duplex. This is a noninvasive diagnostic test that uses ultrasound technology to look at the aorta and iliac arteries to detect any restriction to blood flow to the buttocks, groin, legs or feet.  No food after 11PM the night before.  Water is OK. (Don't drink liquids if you have been instructed not to for ANOTHER test). Avoid foods that produce bowel gas, for 24 hours prior to exam (see below). No breakfast, no chewing gum, no smoking or carbonated beverages. Patient may take morning medications with water. Come in for test at least 15 minutes early to register. This will take approximately 60 minutes This will take place at [address], [place] [address]  Your physician has requested that you have an ankle brachial index (ABI). During this test an ultrasound and blood pressure cuff are used to evaluate the arteries that supply the arms and legs with blood.  Allow thirty minutes for this exam.  There are no restrictions or special instructions.  This will take place at [address] [facility] [address], [place] [address]  Please note: We ask at that you not bring children with you during ultrasound (echo/ vascular) testing. Due to room size and safety concerns, children are not allowed in the ultrasound rooms during exams. Our front office staff cannot provide observation of children in our  lobby area while testing is being conducted. An adult accompanying a patient to their appointment will only be allowed in the ultrasound room at the discretion of the ultrasound technician under special circumstances. We apologize for any inconvenience.    Follow-Up: At [facility], you and your health needs are our priority.  As part of our continuing mission to provide you with exceptional heart care, our providers are all part of one team.  This team includes your primary Cardiologist (physician) and Advanced Practice Providers or APPs (Physician Assistants and Nurse Practitioners) who all work together to provide you with the care you need, when you need it.  Your next appointment:   12 month(s)  Provider:   You may see Constancia Delton, MD or one of the following Advanced Practice Providers on your designated Care Team:   Laneta Pintos, NP  We recommend signing up for the patient portal called "MyChart".  Sign up information is provided on this After Visit Summary.  MyChart is used to connect with patients for Virtual Visits (Telemedicine).  Patients are able to view lab/test results, encounter notes, upcoming appointments, etc.  Non-urgent messages can be sent to your provider as well.   To learn more about what you can do with MyChart, go to [email or web address].

## 2023-10-29 ENCOUNTER — Encounter: Payer: Self-pay | Admitting: Family Medicine

## 2023-10-30 ENCOUNTER — Other Ambulatory Visit: Payer: Self-pay

## 2023-10-30 DIAGNOSIS — J302 Other seasonal allergic rhinitis: Secondary | ICD-10-CM

## 2023-10-30 DIAGNOSIS — E7800 Pure hypercholesterolemia, unspecified: Secondary | ICD-10-CM

## 2023-10-30 MED ORDER — ROSUVASTATIN CALCIUM 20 MG PO TABS
20.0000 mg | ORAL_TABLET | Freq: Every day | ORAL | 1 refills | Status: DC
Start: 2023-10-30 — End: 2024-03-04

## 2023-10-30 MED ORDER — MONTELUKAST SODIUM 10 MG PO TABS
10.0000 mg | ORAL_TABLET | Freq: Every day | ORAL | 3 refills | Status: AC
Start: 2023-10-30 — End: ?

## 2023-11-02 ENCOUNTER — Other Ambulatory Visit: Payer: Self-pay | Admitting: Family Medicine

## 2023-11-02 DIAGNOSIS — E1142 Type 2 diabetes mellitus with diabetic polyneuropathy: Secondary | ICD-10-CM

## 2023-11-06 ENCOUNTER — Ambulatory Visit: Admitting: Podiatry

## 2023-11-08 ENCOUNTER — Other Ambulatory Visit: Payer: Self-pay

## 2023-11-08 ENCOUNTER — Telehealth: Payer: Self-pay | Admitting: Family Medicine

## 2023-11-08 DIAGNOSIS — J4521 Mild intermittent asthma with (acute) exacerbation: Secondary | ICD-10-CM

## 2023-11-08 MED ORDER — FLUTICASONE-SALMETEROL 250-50 MCG/ACT IN AEPB: 1.0000 | INHALATION_SPRAY | Freq: Two times a day (BID) | RESPIRATORY_TRACT | 0 refills | Status: AC

## 2023-11-08 NOTE — Telephone Encounter (Signed)
 [facility] is requesting refill fluticasone -salmeterol (ADVAIR DISKUS) 250-50 MCG/ACT AEPB   Please advise

## 2023-11-13 ENCOUNTER — Other Ambulatory Visit: Payer: Self-pay | Admitting: Family Medicine

## 2023-11-13 DIAGNOSIS — E119 Type 2 diabetes mellitus without complications: Secondary | ICD-10-CM

## 2023-11-20 ENCOUNTER — Ambulatory Visit: Admitting: Podiatry

## 2023-11-20 DIAGNOSIS — B351 Tinea unguium: Secondary | ICD-10-CM | POA: Diagnosis not present

## 2023-11-20 DIAGNOSIS — L97511 Non-pressure chronic ulcer of other part of right foot limited to breakdown of skin: Secondary | ICD-10-CM

## 2023-11-20 DIAGNOSIS — M79676 Pain in unspecified toe(s): Secondary | ICD-10-CM

## 2023-11-20 NOTE — Progress Notes (Signed)
 Diabetes mellitus with chronic ulceration subfifth right and hallux right subfifth left and left  Objective: Vital signs are stable alert oriented x 3.  Pulses are palpable.  There is no erythema edema cellulitis drainage or odor reactive hyperkeratotic tissue to the aforementioned areas once debrided the pills the only wound is subfifth met right.  Once debrided today I cleaned this down to subcutaneous fat does not appear to be probing to bone.  Assessment diabetes mellitus with diabetic peripheral neuropathy angiopathy and ulcerations.  Plan: Debridement of wound subcutaneous fat.  Placed padding.  Follow-up with her as needed or in 3 weeks

## 2023-11-27 ENCOUNTER — Ambulatory Visit: Payer: Self-pay

## 2023-11-27 DIAGNOSIS — Z0000 Encounter for general adult medical examination without abnormal findings: Secondary | ICD-10-CM

## 2023-11-27 NOTE — Patient Instructions (Addendum)
 Karen Stephenson , Thank you for taking time out of your busy schedule to complete your Annual Wellness Visit with me. I enjoyed our conversation and look forward to speaking with you again next year. I, as well as your care team,  appreciate your ongoing commitment to your health goals. Please review the following plan we discussed and let me know if I can assist you in the future.   Follow up Visits: Next Medicare AWV with our clinical staff:   12/02/24 @ 2:30 PM BY PHONE Have you seen your provider in the last 6 months (3 months if uncontrolled diabetes)? Yes  Clinician Recommendations:  Aim for 30 minutes of exercise or brisk walking, 6-8 glasses of water, and 5 servings of fruits and vegetables each day. TAKE CARE!      This is a list of the screening recommended for you and due dates:  Health Maintenance  Topic Date Due   Zoster (Shingles) Vaccine (1 of 2) Never done   Eye exam for diabetics  08/04/2020   DTaP/Tdap/Td vaccine (3 - Td or Tdap) 04/17/2021   COVID-19 Vaccine (4 - 2024-25 season) 03/03/2023   Yearly kidney health urinalysis for diabetes  08/30/2023   Flu Shot  01/31/2024   Hemoglobin A1C  02/04/2024   Yearly kidney function blood test for diabetes  05/07/2024   Complete foot exam   06/16/2024   Medicare Annual Wellness Visit  11/26/2024   Mammogram  09/03/2025   Colon Cancer Screening  07/18/2027   DEXA scan (bone density measurement)  09/03/2028   Pneumonia Vaccine  Completed   Hepatitis C Screening  Completed   HPV Vaccine  Aged Out   Meningitis B Vaccine  Aged Out    Advanced directives: (ACP Link)Information on Advanced Care Planning can be found at [place]  Best boy Advance Health Care Directives Advance Health Care Directives. [email or web address]  Advance Care Planning is important because it:  [x]  Makes sure you receive the medical care that is consistent with your values, goals, and preferences  [x]  It provides guidance to your family and loved ones and  reduces their decisional burden about whether or not they are making the right decisions based on your wishes.  Follow the link provided in your after visit summary or read over the paperwork we have mailed to you to help you started getting your Advance Directives in place. If you need assistance in completing these, please reach out to us  so that we can help you!

## 2023-11-27 NOTE — Progress Notes (Addendum)
 Subjective:   Karen Stephenson is a 67 y.o. who presents for a Medicare Wellness preventive visit.  As a reminder, Annual Wellness Visits don't include a physical exam, and some assessments may be limited, especially if this visit is performed virtually. We may recommend an in-person follow-up visit with your provider if needed.  Visit Complete: Virtual I connected with  Karen Stephenson on 11/27/23 by a audio enabled telemedicine application and verified that I am speaking with the correct person using two identifiers.  Patient Location: Home  Provider Location: Home Office  I discussed the limitations of evaluation and management by telemedicine. The patient expressed understanding and agreed to proceed.  Vital Signs: Because this visit was a virtual/telehealth visit, some criteria may be missing or patient reported. Any vitals not documented were not able to be obtained and vitals that have been documented are patient reported.  VideoDeclined- This patient declined Librarian, academic. Therefore the visit was completed with audio only.  Persons Participating in Visit: Patient.  AWV Questionnaire: No: Patient Medicare AWV questionnaire was not completed prior to this visit.  Cardiac Risk Factors include: advanced age (>82men, >15 women);diabetes mellitus;hypertension;dyslipidemia;obesity (BMI >30kg/m2)     Objective:     There were no vitals filed for this visit. There is no height or weight on file to calculate BMI.     11/27/2023    9:36 AM 09/30/2023    6:28 PM 11/21/2022   10:52 AM 10/04/2021   11:04 AM 08/30/2021    8:56 AM 08/02/2020    6:00 AM 07/26/2020    9:38 AM  Advanced Directives  Does Patient Have a Medical Advance Directive? No No No No No No No  Would patient like information on creating a medical advance directive? No - Patient declined No - Patient declined  No - Patient declined No - Patient declined No - Patient declined No - Patient  declined    Current Medications (verified) Outpatient Encounter Medications as of 11/27/2023  Medication Sig   acarbose  (PRECOSE ) 25 MG tablet TAKE 1 TABLET BY MOUTH THREE TIMES DAILY WITH MEALS   albuterol  (VENTOLIN  HFA) 108 (90 Base) MCG/ACT inhaler Inhale 2 puffs into the lungs every 6 (six) hours as needed for wheezing or shortness of breath.   cetirizine (ZYRTEC) 10 MG tablet Take 10 mg by mouth daily.   clopidogrel  (PLAVIX ) 75 MG tablet Take 1 tablet by mouth once daily   fluticasone  (FLONASE ) 50 MCG/ACT nasal spray Place 1 spray into both nostrils daily.   fluticasone -salmeterol (ADVAIR DISKUS) 250-50 MCG/ACT AEPB Inhale 1 puff into the lungs in the morning and at bedtime.   glucose blood test strip Check sugar once daily, E 11.9   Homeopathic Products (LEG CRAMP RELIEF PO) Take 2 capsules by mouth daily.    hydrochlorothiazide  (HYDRODIURIL ) 25 MG tablet Take 1 tablet (25 mg total) by mouth daily.   hyoscyamine  (LEVBID) 0.375 MG 12 hr tablet Take 1 tablet (0.375 mg total) by mouth as needed.   JANUVIA  100 MG tablet Take 1 tablet by mouth once daily   JARDIANCE  10 MG TABS tablet TAKE 1 TABLET BY MOUTH ONCE DAILY BEFORE BREAKFAST   Lancets (ONETOUCH ULTRASOFT) lancets Check sugar once daily ,DX E11.9   losartan  (COZAAR ) 100 MG tablet Take 1 tablet by mouth once daily   montelukast  (SINGULAIR ) 10 MG tablet Take 1 tablet (10 mg total) by mouth at bedtime.   Multiple Vitamins-Minerals (MULTIVITAMIN WITH MINERALS) tablet Take 1 tablet by  mouth daily.   mupirocin  ointment (BACTROBAN ) 2 % Apply 1 Application topically 2 (two) times daily.   pantoprazole  (PROTONIX ) 40 MG tablet Take 1 tablet (40 mg total) by mouth daily.   rosuvastatin  (CRESTOR ) 20 MG tablet Take 1 tablet (20 mg total) by mouth daily.   vitamin B-12 (CYANOCOBALAMIN) 1000 MCG tablet Take 1,000 mcg by mouth daily.   Vitamin D , Cholecalciferol , 50 MCG (2000 UT) CAPS Take 2,000 Units by mouth daily.   zinc gluconate 50 MG tablet  Take 50 mg by mouth daily.   No facility-administered encounter medications on file as of 11/27/2023.    Allergies (verified) Amoxicillin -pot clavulanate, Aspirin , Ducodyl [bisacodyl], Erythromycin, Glipizide, Metformin hcl, Latex, and Macrobid  [nitrofurantoin ]   History: Past Medical History:  Diagnosis Date   Allergy    Anginal pain (HCC)    a. 07/2020 MV: No ischemia/infarct-->low risk.   Asthma    Cataract    I seem to remember Dr. Parke Boll mentioned a very small cararact during one of my visits.   Diabetes mellitus without complication (HCC)    Family history of adverse reaction to anesthesia    SISTER HAS ISSUE DURING COLONOSCOPY   GERD (gastroesophageal reflux disease)    History of echocardiogram    a. 07/2020 Echo: EF 60-65%, no rwma, nl RV fxn, mild Ao sclerosis w/o stenosis.   Hypercholesteremia    Hypertension    Hypokalemia    Neuromuscular disorder (HCC)    Neuropathy diabetes related   PAD (peripheral artery disease) (HCC)    a. 08/2021 LE Angio/PTA: Heavily calcified short occlusion of the R CIA (s/p balloon expandable covered stent), mild nonobstructive infrainguinal disease.  Mild left SFA disease.  Borderline significant L popliteal disease with three-vessel runoff below the knee.   S/P bilateral salpingo-oophorectomy 08/18/2020   S/P laparoscopic hysterectomy 08/18/2020   Sleep apnea    WAS NOT GIVEN CPAP   Past Surgical History:  Procedure Laterality Date   ABDOMINAL AORTOGRAM W/LOWER EXTREMITY N/A 08/30/2021   Procedure: ABDOMINAL AORTOGRAM W/LOWER EXTREMITY;  Surgeon: Wenona Hamilton, MD;  Location: [facility];  Service: Cardiovascular;  Laterality: N/A;   ABDOMINAL HYSTERECTOMY  08/16/2020   Dr. Dixie Frederickson OBGYN   BACK SURGERY  1986   COLONOSCOPY WITH PROPOFOL  N/A 07/17/2017   Procedure: COLONOSCOPY WITH PROPOFOL ;  Surgeon: Cassie Click, MD;  Location[facility] ENDOS[facility];  Service: Endoscopy;  Laterality: N/A;   NASAL SINUS SURGERY      PERIPHERAL VASCULAR INTERVENTION Right 08/30/2021   Procedure: PERIPHERAL VASCULAR INTERVENTION;  Surgeon: Wenona Hamilton, MD;  Location: [facility] IN[facility] CV [facility];  Service: Cardiovascular;  Laterality: Right;  common Iliac   TOTAL LAPAROSCOPIC HYSTERECTOMY WITH BILATERAL SALPINGO OOPHORECTOMY Bilateral 08/02/2020   Procedure: TOTAL LAPAROSCOPIC HYSTERECTOMY WITH BILATERAL SALPINGO OOPHORECTOMY;  Surgeon: Alben Alma, MD;  Location: [facility] ORS;  Service: Gynecology;  Laterality: Bilateral;  RN TO ASSIST   UTERINE FIBROID SURGERY     Family History  Problem Relation Age of Onset   Diabetes Mother    Breast cancer Mother 38   Arthritis Mother    Cancer Mother    Heart disease Father    Alcohol abuse Father    Breast cancer Sister 74       dx twice    Cancer Sister    Heart disease Paternal Grandfather    Hypertension Brother    Colon cancer Neg Hx    Ovarian cancer Neg Hx    Social History   Socioeconomic History  Marital status: Married    Spouse name: Not on file   Number of children: Not on file   Years of education: Not on file   Highest education level: Associate degree: academic program  Occupational History   Not on file  Tobacco Use   Smoking status: Former    Current packs/day: 0.00    Types: Cigarettes    Quit date: 07/01/2013    Years since quitting: 10.4   Smokeless tobacco: Never  Vaping Use   Vaping status: Never Used  Substance and Sexual Activity   Alcohol use: No   Drug use: No   Sexual activity: Yes    Comment: Hysterectomy  Other Topics Concern   Not on file  Social History Narrative   Not on file   Social Drivers of Health   Financial Resource Strain: Low Risk  (11/27/2023)   Overall Financial Resource Strain (CARDIA)    Difficulty of Paying Living Expenses: Not hard at all  Food Insecurity: No Food Insecurity (11/27/2023)   Hunger Vital Sign    Worried About Running Out of Food in the Last Year: Never true    Ran Out of Food in the Last  Year: Never true  Transportation Needs: No Transportation Needs (11/27/2023)   PRAPARE - Administrator, Civil Service (Medical): No    Lack of Transportation (Non-Medical): No  Physical Activity: Insufficiently Active (11/27/2023)   Exercise Vital Sign    Days of Exercise per Week: 2 days    Minutes of Exercise per Session: 20 min  Stress: No Stress Concern Present (11/27/2023)   [employer] of Occupational Health - Occupational Stress Questionnaire    Feeling of Stress : Only a little  Social Connections: Moderately Integrated (11/27/2023)   Social Connection and Isolation Panel [NHANES]    Frequency of Communication with Friends and Family: More than three times a week    Frequency of Social Gatherings with Friends and Family: Twice a week    Attends Religious Services: More than 4 times per year    Active Member of [employer] or Organizations: No    Attends Engineer, structural: Never    Marital Status: Married    Tobacco Counseling Counseling given: Not Answered    Clinical Intake:  Pre-visit preparation completed: Yes  Pain : No/denies pain     BMI - recorded: 37.7 Nutritional Status: BMI > 30  Obese Nutritional Risks: None Diabetes: Yes CBG done?: No Did pt. bring in CBG monitor from home?: No  Lab Results  Component Value Date   HGBA1C 7.5 (H) 08/07/2023   HGBA1C 7.2 (H) 05/08/2023   HGBA1C 7.3 (A) 08/29/2022     How often do you need to have someone help you when you read instructions, pamphlets, or other written materials from your doctor or pharmacy?: 1 - Never  Interpreter Needed?: No  Information entered by :: Dellie Fergusson, LPN   Activities of Daily Living    11/27/2023    9:37 AM 11/26/2023    1:26 PM  In your present state of health, do you have any difficulty performing the following activities:  Hearing? 0 0  Vision? 1 1  Difficulty concentrating or making decisions? 0 0  Walking or climbing stairs? 0 0  Dressing or  bathing? 0 0  Doing errands, shopping? 0 0  Preparing Food and eating ? N N  Using the Toilet? N N  In the past six months, have you accidently leaked urine? N N  Do you have problems with loss of bowel control? N N  Managing your Medications? N N  Managing your Finances? N N  Housekeeping or managing your Housekeeping? N N    Patient Care Team: Mimi Alt, MD as PCP - General (Family Medicine) Constancia Delton, MD as PCP - Cardiology (Cardiology)  Indicate any recent Medical Services you may have received from other than [facility] providers in the past year (date may be approximate).     Assessment:    This is a routine wellness examination for Neidra.  Hearing/Vision screen Hearing Screening - Comments:: NO AIDS Vision Screening - Comments:: WEARS GLASSES ALL DAY- [facility]   Goals Addressed             This Visit's Progress    DIET - INCREASE WATER INTAKE         Depression Screen     11/27/2023    9:34 AM 08/07/2023    2:08 PM 05/08/2023    1:52 PM 11/21/2022   10:51 AM 04/11/2022    1:36 PM 11/22/2021    9:58 AM 10/04/2021   11:03 AM  PHQ 2/9 Scores  PHQ - 2 Score 0 0 0 0 0 0 0  PHQ- 9 Score 0 0 0   0     Fall Risk     11/27/2023    9:36 AM 11/26/2023    1:26 PM 08/07/2023    2:08 PM 11/20/2022   12:25 PM 04/11/2022    1:36 PM  Fall Risk   Falls in the past year? 0 0 0 0 0  Number falls in past yr: 0  0 0 0  Injury with Fall? 0  0 0 0  Risk for fall due to : No Fall Risks    No Fall Risks  Follow up Falls evaluation completed        MEDICARE RISK AT HOME:  Medicare Risk at Home Any stairs in or around the home?: Yes If so, are there any without handrails?: No Home free of loose throw rugs in walkways, pet beds, electrical cords, etc?: Yes Adequate lighting in your home to reduce risk of falls?: Yes Life alert?: No Use of a cane, walker or w/c?: No Grab bars in the bathroom?: Yes Shower chair or bench in shower?: Yes Elevated toilet  seat or a handicapped toilet?: Yes  TIMED UP AND GO:  Was the test performed?  No  Cognitive Function: 6CIT completed        11/21/2022   10:57 AM 04/06/2020   10:40 AM  6CIT Screen  What Year? 0 points 0 points  What month? 0 points 0 points  What time? 0 points 0 points  Count back from 20 0 points 0 points  Months in reverse 0 points 0 points  Repeat phrase 0 points 2 points  Total Score 0 points 2 points    Immunizations Immunization History  Administered Date(s) Administered   Fluad Quad(high Dose 65+) 04/06/2020, 04/11/2022   Fluad Trivalent(High Dose 65+) 05/08/2023   Influenza Whole 04/23/2017   Influenza,inj,Quad PF,6+ Mos 03/26/2018, 04/01/2019   Moderna Sars-Covid-2 Vaccination 06/30/2020   PFIZER(Purple Top)SARS-COV-2 Vaccination 10/31/2019, 11/24/2019   PNEUMOCOCCAL CONJUGATE-20 11/22/2021   Pneumococcal Polysaccharide-23 02/29/2012, 04/06/2020   Td 08/17/2003   Tdap 04/18/2011    Screening Tests Health Maintenance  Topic Date Due   Zoster Vaccines- Shingrix (1 of 2) Never done   OPHTHALMOLOGY EXAM  08/04/2020   DTaP/Tdap/Td (3 - Td or Tdap) 04/17/2021  COVID-19 Vaccine (4 - 2024-25 season) 03/03/2023   Diabetic kidney evaluation - Urine ACR  08/30/2023   INFLUENZA VACCINE  01/31/2024   HEMOGLOBIN A1C  02/04/2024   Diabetic kidney evaluation - eGFR measurement  05/07/2024   FOOT EXAM  06/16/2024   Medicare Annual Wellness (AWV)  11/26/2024   MAMMOGRAM  09/03/2025   Colonoscopy  07/18/2027   Pneumonia Vaccine 14+ Years old  Completed   DEXA SCAN  Completed   Hepatitis C Screening  Completed   HPV VACCINES  Aged Out   Meningococcal B Vaccine  Aged Out    Health Maintenance  Health Maintenance Due  Topic Date Due   Zoster Vaccines- Shingrix (1 of 2) Never done   OPHTHALMOLOGY EXAM  08/04/2020   DTaP/Tdap/Td (3 - Td or Tdap) 04/17/2021   COVID-19 Vaccine (4 - 2024-25 season) 03/03/2023   Diabetic kidney evaluation - Urine ACR  08/30/2023    Health Maintenance Items Addressed: UP TO DATE ON MAMMOGRAM, COLONOSCOPY & BONE DENSITY; NEED COVID, TDAP;UNSURE ABOUT SHINGRIX  Additional Screening:  Vision Screening: Recommended annual ophthalmology exams for early detection of glaucoma and other disorders of the eye.  Dental Screening: Recommended annual dental exams for proper oral hygiene  Community Resource Referral / Chronic Care Management: CRR required this visit?  No   CCM required this visit?  No   Plan:    I have personally reviewed and noted the following in the patient's chart:   Medical and social history Use of alcohol, tobacco or illicit drugs  Current medications and supplements including opioid prescriptions. Patient is not currently taking opioid prescriptions. Functional ability and status Nutritional status Physical activity Advanced directives List of other physicians Hospitalizations, surgeries, and ER visits in previous 12 months Vitals Screenings to include cognitive, depression, and falls Referrals and appointments  In addition, I have reviewed and discussed with patient certain preventive protocols, quality metrics, and best practice recommendations. A written personalized care plan for preventive services as well as general preventive health recommendations were provided to patient.   Pinky Bright, LPN   12/04/5407   After Visit Summary: (MyChart) Due to this being a telephonic visit, the after visit summary with patients personalized plan was offered to patient via MyChart   Notes: Nothing significant to report at this time.

## 2023-12-02 ENCOUNTER — Other Ambulatory Visit: Payer: Self-pay | Admitting: Family Medicine

## 2023-12-02 MED ORDER — HYOSCYAMINE SULFATE ER 0.375 MG PO TB12: 0.3750 mg | ORAL_TABLET | ORAL | 3 refills | Status: DC | PRN

## 2023-12-04 ENCOUNTER — Encounter: Payer: Self-pay | Admitting: Family Medicine

## 2023-12-04 ENCOUNTER — Other Ambulatory Visit (HOSPITAL_BASED_OUTPATIENT_CLINIC_OR_DEPARTMENT_OTHER): Payer: Self-pay

## 2023-12-04 ENCOUNTER — Ambulatory Visit: Payer: Medicare PPO | Admitting: Family Medicine

## 2023-12-04 VITALS — BP 139/69 | HR 75 | Ht 68.0 in | Wt 247.0 lb

## 2023-12-04 DIAGNOSIS — E1142 Type 2 diabetes mellitus with diabetic polyneuropathy: Secondary | ICD-10-CM | POA: Diagnosis not present

## 2023-12-04 DIAGNOSIS — I200 Unstable angina: Secondary | ICD-10-CM

## 2023-12-04 DIAGNOSIS — E538 Deficiency of other specified B group vitamins: Secondary | ICD-10-CM

## 2023-12-04 DIAGNOSIS — J302 Other seasonal allergic rhinitis: Secondary | ICD-10-CM

## 2023-12-04 DIAGNOSIS — K582 Mixed irritable bowel syndrome: Secondary | ICD-10-CM

## 2023-12-04 DIAGNOSIS — E1169 Type 2 diabetes mellitus with other specified complication: Secondary | ICD-10-CM

## 2023-12-04 DIAGNOSIS — I10 Essential (primary) hypertension: Secondary | ICD-10-CM | POA: Diagnosis not present

## 2023-12-04 DIAGNOSIS — E559 Vitamin D deficiency, unspecified: Secondary | ICD-10-CM | POA: Diagnosis not present

## 2023-12-04 DIAGNOSIS — G4733 Obstructive sleep apnea (adult) (pediatric): Secondary | ICD-10-CM

## 2023-12-04 DIAGNOSIS — I872 Venous insufficiency (chronic) (peripheral): Secondary | ICD-10-CM

## 2023-12-04 DIAGNOSIS — K219 Gastro-esophageal reflux disease without esophagitis: Secondary | ICD-10-CM

## 2023-12-04 DIAGNOSIS — E785 Hyperlipidemia, unspecified: Secondary | ICD-10-CM

## 2023-12-04 MED ORDER — HYOSCYAMINE SULFATE ER 0.375 MG PO TB12: 0.3750 mg | ORAL_TABLET | ORAL | 3 refills | Status: AC | PRN

## 2023-12-04 NOTE — Assessment & Plan Note (Signed)
 Chronic  Intermittent symptoms  Diarrhea and constipation previously controlled but due to recent stress she is in need of this medication  Requesting PA to have this medication covered under Medicare insurance for affordability

## 2023-12-04 NOTE — Assessment & Plan Note (Signed)
 Chronic     - [facility] for DM eye exam, appt in Nov 2025

## 2023-12-04 NOTE — Progress Notes (Signed)
 Established patient visit   Patient: Karen Stephenson   DOB: 1956/05/27   68 y.o. Female  MRN: [id number] Visit Date: 12/04/2023  Today's healthcare provider: Mimi Alt, MD   Chief Complaint  Patient presents with   Hypertension   Diabetes   Subjective       Discussed the use of AI scribe software for clinical note transcription with the patient, who gave verbal consent to proceed.  History of Present Illness Karen Stephenson is a 68 year old female with hypertension and diabetes who presents for a follow-up visit.  Her blood pressure is stable at 139/69 mmHg. She continues her antihypertensive medications without issues, although a recently prescribed medication was not covered under her insurance, prompting a discussion about potential coverage under Medicare Part B or D.  Her diabetes management includes a history of an A1c of 7.5% in February. She is on a regimen that includes Acarbose  25 mg three times a day with meals, Januvia  100 mg daily, Jardiance  10 mg daily, and Lantus. She does not check her blood sugar regularly and needs a new meter. An eye exam is scheduled for November at [facility] as part of her diabetes management.  She has a history of irritable bowel syndrome, managed with hyoscyamine  0.375 mg every 12 hours as needed. This medication helps control symptoms of alternating constipation and diarrhea, along with abdominal pain. She reports a severe allergic reaction to onions, causing symptoms like 'lightning bolts' in her belly, throat swelling, and burning sensations.  Her medication list includes Plavix  75 mg daily for angina and to maintain stent patency, Advair Diskus 250/50 mcg, hydrochlorothiazide  25 mg daily for blood pressure, Singulair  10 mg for allergies, Protonix  40 mg for reflux, Crestor  40 mg for cholesterol, B12 1000 mcg for deficiency, and vitamin D  2000 units. She also takes zinc, which she started during the COVID pandemic.  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 This stress has contributed to some stomach issues, which she manages with her medication.     Past Medical History:  Diagnosis Date   Allergy    Anginal pain (HCC)    a. 07/2020 MV: No ischemia/infarct-->low risk.   Asthma    Cataract    I seem to remember Dr. Parke Boll mentioned a very small cararact during one of my visits.   Diabetes mellitus without complication (HCC)    Family history of adverse reaction to anesthesia    SISTER HAS ISSUE DURING COLONOSCOPY   GERD (gastroesophageal reflux disease)    History of echocardiogram    a. 07/2020 Echo: EF 60-65%, no rwma, nl RV fxn, mild Ao sclerosis w/o stenosis.   Hypercholesteremia    Hypertension    Hypokalemia    Neuromuscular disorder (HCC)    Neuropathy diabetes related   PAD (peripheral artery disease) (HCC)    a. 08/2021 LE Angio/PTA: Heavily calcified short occlusion of the R CIA (s/p balloon expandable covered stent), mild nonobstructive infrainguinal disease.  Mild left SFA disease.  Borderline significant L popliteal disease with three-vessel runoff below the knee.   S/P bilateral salpingo-oophorectomy 08/18/2020   S/P laparoscopic hysterectomy 08/18/2020   Sleep apnea    WAS NOT GIVEN CPAP    Medications: Outpatient Medications Prior to Visit  Medication Sig   acarbose  (PRECOSE ) 25 MG tablet TAKE 1 TABLET BY MOUTH THREE TIMES DAILY WITH MEALS  albuterol  (VENTOLIN  HFA) 108 (90 Base) MCG/ACT inhaler Inhale 2 puffs into the lungs every 6 (six) hours as needed for wheezing or shortness of breath.   cetirizine (ZYRTEC) 10 MG tablet Take 10 mg by mouth daily.   clopidogrel  (PLAVIX ) 75 MG tablet Take 1 tablet by mouth once daily   fluticasone  (FLONASE ) 50 MCG/ACT nasal spray Place 1 spray into both nostrils daily.   fluticasone -salmeterol (ADVAIR DISKUS) 250-50 MCG/ACT AEPB Inhale 1 puff into the  lungs in the morning and at bedtime.   glucose blood test strip Check sugar once daily, E 11.9   Homeopathic Products (LEG CRAMP RELIEF PO) Take 2 capsules by mouth daily.    hydrochlorothiazide  (HYDRODIURIL ) 25 MG tablet Take 1 tablet (25 mg total) by mouth daily.   JANUVIA  100 MG tablet Take 1 tablet by mouth once daily   JARDIANCE  10 MG TABS tablet TAKE 1 TABLET BY MOUTH ONCE DAILY BEFORE BREAKFAST   Lancets (ONETOUCH ULTRASOFT) lancets Check sugar once daily ,DX E11.9   losartan  (COZAAR ) 100 MG tablet Take 1 tablet by mouth once daily   montelukast  (SINGULAIR ) 10 MG tablet Take 1 tablet (10 mg total) by mouth at bedtime.   Multiple Vitamins-Minerals (MULTIVITAMIN WITH MINERALS) tablet Take 1 tablet by mouth daily.   mupirocin  ointment (BACTROBAN ) 2 % Apply 1 Application topically 2 (two) times daily.   pantoprazole  (PROTONIX ) 40 MG tablet Take 1 tablet (40 mg total) by mouth daily.   rosuvastatin  (CRESTOR ) 20 MG tablet Take 1 tablet (20 mg total) by mouth daily.   vitamin B-12 (CYANOCOBALAMIN) 1000 MCG tablet Take 1,000 mcg by mouth daily.   Vitamin D , Cholecalciferol , 50 MCG (2000 UT) CAPS Take 2,000 Units by mouth daily.   zinc gluconate 50 MG tablet Take 50 mg by mouth daily.   [DISCONTINUED] hyoscyamine  (LEVBID) 0.375 MG 12 hr tablet Take 1 tablet (0.375 mg total) by mouth as needed.   No facility-administered medications prior to visit.    Review of Systems  Last metabolic panel Lab Results  Component Value Date   GLUCOSE 210 (H) 05/08/2023   NA 140 05/08/2023   K 4.2 05/08/2023   CL 105 05/08/2023   CO2 23 05/08/2023   BUN 12 05/08/2023   CREATININE 0.72 05/08/2023   EGFR 90 05/08/2023   CALCIUM  9.7 05/08/2023   PROT 7.3 06/08/2020   ALBUMIN 4.2 06/08/2020   LABGLOB 3.1 06/08/2020   AGRATIO 1.4 06/08/2020   BILITOT 0.6 06/08/2020   ALKPHOS 71 06/08/2020   AST 24 06/08/2020   ALT 24 06/08/2020   Last lipids Lab Results  Component Value Date   CHOL 117  11/22/2021   HDL 36 (L) 11/22/2021   LDLCALC 58 11/22/2021   TRIG 127 11/22/2021   CHOLHDL 3.3 11/22/2021   Last hemoglobin A1c Lab Results  Component Value Date   HGBA1C 7.5 (H) 08/07/2023   Last thyroid  functions Lab Results  Component Value Date   TSH 2.450 11/22/2021        Objective    BP 139/69   Pulse 75   Ht 5\' 8"  (1.727 m)   Wt 247 lb (112 kg)   SpO2 97%   BMI 37.56 kg/m  BP Readings from Last 3 Encounters:  12/04/23 139/69  10/23/23 110/68  09/30/23 (!) 176/80   Wt Readings from Last 3 Encounters:  12/04/23 247 lb (112 kg)  10/23/23 248 lb (112.5 kg)  08/07/23 252 lb 4.8 oz (114.4 kg)  Physical Exam  General: Alert, no acute distress Cardio: Normal S1 and S2, RRR, no r/m/g Pulm: CTAB, normal work of breathing ABD: soft, abdomen is not distended, there is no tenderness to palpation, normal BS  Extremities: no LE edema    No results found for any visits on 12/04/23.  Assessment & Plan     Problem List Items Addressed This Visit       Cardiovascular and Mediastinum   Unstable angina pectoris (HCC)   Essential (primary) hypertension - Primary   Relevant Orders   CMP14+EGFR   Chronic venous insufficiency     Respiratory   Obstructive apnea   Allergic rhinitis, seasonal     Digestive   Irritable bowel syndrome with both constipation and diarrhea   Chronic  Intermittent symptoms  Diarrhea and constipation previously controlled but due to recent stress she is in need of this medication  Requesting PA to have this medication covered under Medicare insurance for affordability       Relevant Medications   hyoscyamine  (LEVBID) 0.375 MG 12 hr tablet   Acid reflux   Relevant Medications   hyoscyamine  (LEVBID) 0.375 MG 12 hr tablet     Endocrine   Hyperlipidemia associated with type 2 diabetes mellitus (HCC)   Relevant Orders   Lipid panel   Diabetes mellitus, type 2 (HCC)   Chronic     - [facility] for DM eye exam, appt in  Nov 2025      Relevant Orders   Hemoglobin A1c   Urine microalbumin-creatinine with uACR     Other   B12 deficiency   Relevant Orders   B12   Avitaminosis D   Relevant Orders   Vitamin D  (25 hydroxy)     Assessment & Plan Diabetes Mellitus Type 2 Type 2 diabetes with last HbA1c of 7.5% in February. She is not regularly checking blood glucose  - not on insulin, do not recommend BG checks at this time - Order repeat HbA1c and urine albumin - Continue Acarbose  25mg  daily , Januvia  100mg  , Jardiance10mg   - Evaluate need for new blood glucose meter  Hypertension Chronic hypertension, well-managed with current regimen. Blood pressure is 139/69 mmHg. - Continue current antihypertensive regimen (hydrochlorothiazide  25 mg & losartan  100mg  daily) - Order updated CMP  Coronary Artery Disease (CAD) CAD with stent placement, managed with Plavix  for angina and stent patency. - Continue Plavix  75 mg daily  Irritable Bowel Syndrome (IBS) IBS with constipation, diarrhea, and abdominal pain. Hyoscyamine  provides effective symptom control. Insurance coverage issue for hyoscyamine  may require prior authorization or exemption. - Reorder hyoscyamine  0.375 mg every 12 hours as needed - Investigate insurance coverage for hyoscyamine , submit for prior authorization or exemption  Allergic Rhinitis Allergic rhinitis managed with Flonase  and Zyrtec. - Continue Flonase  50mcg  and Zyrtec a10mg  daily  Obstructive Sleep Apnea Previous sleep study inconclusive. No current symptoms. Home sleep study may be considered if symptoms develop. - Consider home sleep study if symptoms develop  Vitamin D  Deficiency Vitamin D  deficiency managed with supplementation. - Continue vitamin D  supplementation, 2000 units daily  - order vitamin D  levels      Return in about 3 months (around 03/05/2024) for DM, HTN.         Mimi Alt, MD  [facility] [phone]  (phone) [phone] (fax)  [facility]

## 2023-12-05 ENCOUNTER — Ambulatory Visit: Payer: Self-pay | Admitting: Family Medicine

## 2023-12-05 LAB — CMP14+EGFR
ALT: 15 IU/L (ref 0–32)
AST: 18 IU/L (ref 0–40)
Albumin: 4.4 g/dL (ref 3.9–4.9)
Alkaline Phosphatase: 68 IU/L (ref 44–121)
BUN/Creatinine Ratio: 16 (ref 12–28)
BUN: 13 mg/dL (ref 8–27)
Bilirubin Total: 0.5 mg/dL (ref 0.0–1.2)
CO2: 23 mmol/L (ref 20–29)
Calcium: 9.9 mg/dL (ref 8.7–10.3)
Chloride: 105 mmol/L (ref 96–106)
Creatinine, Ser: 0.82 mg/dL (ref 0.57–1.00)
Globulin, Total: 3.2 g/dL (ref 1.5–4.5)
Glucose: 133 mg/dL — ABNORMAL HIGH (ref 70–99)
Potassium: 3.6 mmol/L (ref 3.5–5.2)
Sodium: 144 mmol/L (ref 134–144)
Total Protein: 7.6 g/dL (ref 6.0–8.5)
eGFR: 77 mL/min/{1.73_m2} (ref >59)

## 2023-12-05 LAB — LIPID PANEL
Chol/HDL Ratio: 3.8 ratio (ref 0.0–4.4)
Cholesterol, Total: 133 mg/dL (ref 100–199)
HDL: 35 mg/dL — ABNORMAL LOW (ref >39)
LDL Chol Calc (NIH): 69 mg/dL (ref 0–99)
Triglycerides: 173 mg/dL — ABNORMAL HIGH (ref 0–149)
VLDL Cholesterol Cal: 29 mg/dL (ref 5–40)

## 2023-12-05 LAB — MICROALBUMIN / CREATININE URINE RATIO
Creatinine, Urine: 60.3 mg/dL
Microalb/Creat Ratio: <5 mg/g{creat} (ref 0–29)
Microalbumin, Urine: <3 ug/mL

## 2023-12-05 LAB — HEMOGLOBIN A1C
Est. average glucose Bld gHb Est-mCnc: 148 mg/dL
Hgb A1c MFr Bld: 6.8 % — ABNORMAL HIGH (ref 4.8–5.6)

## 2023-12-05 LAB — VITAMIN B12: Vitamin B-12: >2000 pg/mL — ABNORMAL HIGH (ref 232–1245)

## 2023-12-05 LAB — VITAMIN D 25 HYDROXY (VIT D DEFICIENCY, FRACTURES): Vit D, 25-Hydroxy: 102 ng/mL — ABNORMAL HIGH (ref 30.0–100.0)

## 2023-12-09 ENCOUNTER — Encounter

## 2023-12-11 ENCOUNTER — Encounter: Payer: Self-pay | Admitting: Podiatry

## 2023-12-11 ENCOUNTER — Ambulatory Visit: Admitting: Podiatry

## 2023-12-11 DIAGNOSIS — M79676 Pain in unspecified toe(s): Secondary | ICD-10-CM | POA: Diagnosis not present

## 2023-12-11 DIAGNOSIS — B351 Tinea unguium: Secondary | ICD-10-CM

## 2023-12-11 DIAGNOSIS — L97511 Non-pressure chronic ulcer of other part of right foot limited to breakdown of skin: Secondary | ICD-10-CM | POA: Diagnosis not present

## 2023-12-11 DIAGNOSIS — D2371 Other benign neoplasm of skin of right lower limb, including hip: Secondary | ICD-10-CM

## 2023-12-11 DIAGNOSIS — E1142 Type 2 diabetes mellitus with diabetic polyneuropathy: Secondary | ICD-10-CM | POA: Diagnosis not present

## 2023-12-11 DIAGNOSIS — D2372 Other benign neoplasm of skin of left lower limb, including hip: Secondary | ICD-10-CM | POA: Diagnosis not present

## 2023-12-11 NOTE — Progress Notes (Signed)
 She presents today for follow-up of her ulcerations hallux and subfifth bilaterally.  She is also complaining of painful elongated toenails.  Objective: Vital signs are stable oriented x 3 pulses are palpable.  She has healed her ulcers but there is still plenty of reactive hyperkeratosis.  Assessment: Pain in limb secondary to diabetic peripheral neuropathy diabetic ulcerations and painful elongated toenails.  Plan: Debridement of all reactive hyperkeratotic tissue debridement of painful elongated toenails.  Placed padding we will follow-up with her in 2 to 3 weeks.

## 2023-12-12 ENCOUNTER — Other Ambulatory Visit (HOSPITAL_COMMUNITY): Payer: Self-pay

## 2023-12-12 ENCOUNTER — Telehealth: Payer: Self-pay

## 2023-12-12 NOTE — Telephone Encounter (Signed)
 Pharmacy Patient Advocate Encounter  Received notification from [employer] that Prior Authorization for Hyoscyamine  ER 0.375 mg tablets has been APPROVED from 12/12/23 to 12/11/24. Ran test claim, Copay is $25.35. This test claim was processed through [facility]- copay amounts may vary at other pharmacies due to pharmacy/plan contracts, or as the patient moves through the different stages of their insurance plan.   PA #/Case ID/Reference #: --

## 2024-01-01 ENCOUNTER — Ambulatory Visit: Admitting: Podiatry

## 2024-01-08 ENCOUNTER — Other Ambulatory Visit: Payer: Self-pay | Admitting: Family Medicine

## 2024-01-08 ENCOUNTER — Other Ambulatory Visit: Payer: Self-pay | Admitting: Cardiovascular Disease

## 2024-01-08 DIAGNOSIS — K219 Gastro-esophageal reflux disease without esophagitis: Secondary | ICD-10-CM

## 2024-01-20 ENCOUNTER — Encounter

## 2024-01-20 ENCOUNTER — Other Ambulatory Visit: Payer: Self-pay | Admitting: Family Medicine

## 2024-01-20 DIAGNOSIS — E119 Type 2 diabetes mellitus without complications: Secondary | ICD-10-CM

## 2024-01-27 ENCOUNTER — Other Ambulatory Visit: Payer: Self-pay | Admitting: Family Medicine

## 2024-01-27 ENCOUNTER — Ambulatory Visit: Admitting: Podiatry

## 2024-01-27 DIAGNOSIS — I10 Essential (primary) hypertension: Secondary | ICD-10-CM

## 2024-01-29 ENCOUNTER — Ambulatory Visit: Admitting: Podiatry

## 2024-02-01 ENCOUNTER — Other Ambulatory Visit: Payer: Self-pay | Admitting: Family Medicine

## 2024-02-01 DIAGNOSIS — E1142 Type 2 diabetes mellitus with diabetic polyneuropathy: Secondary | ICD-10-CM

## 2024-02-09 ENCOUNTER — Other Ambulatory Visit: Payer: Self-pay | Admitting: Family Medicine

## 2024-02-09 DIAGNOSIS — E119 Type 2 diabetes mellitus without complications: Secondary | ICD-10-CM

## 2024-02-10 ENCOUNTER — Ambulatory Visit: Attending: Cardiovascular Disease

## 2024-02-10 ENCOUNTER — Ambulatory Visit (INDEPENDENT_AMBULATORY_CARE_PROVIDER_SITE_OTHER)

## 2024-02-10 DIAGNOSIS — I739 Peripheral vascular disease, unspecified: Secondary | ICD-10-CM

## 2024-02-10 DIAGNOSIS — Z95828 Presence of other vascular implants and grafts: Secondary | ICD-10-CM

## 2024-02-11 LAB — VAS US ABI WITH/WO TBI
Left ABI: 0.83
Right ABI: 0.84

## 2024-02-12 ENCOUNTER — Ambulatory Visit: Admitting: Podiatry

## 2024-02-12 DIAGNOSIS — M79676 Pain in unspecified toe(s): Secondary | ICD-10-CM | POA: Diagnosis not present

## 2024-02-12 DIAGNOSIS — B351 Tinea unguium: Secondary | ICD-10-CM

## 2024-02-12 DIAGNOSIS — L97521 Non-pressure chronic ulcer of other part of left foot limited to breakdown of skin: Secondary | ICD-10-CM

## 2024-02-12 DIAGNOSIS — E1142 Type 2 diabetes mellitus with diabetic polyneuropathy: Secondary | ICD-10-CM

## 2024-02-12 DIAGNOSIS — L97511 Non-pressure chronic ulcer of other part of right foot limited to breakdown of skin: Secondary | ICD-10-CM | POA: Diagnosis not present

## 2024-02-13 NOTE — Progress Notes (Signed)
 She presents today for follow-up of her diabetic ulcerations plantar aspect of the bilateral foot particularly the fifth metatarsal phalangeal joint area right hallux right and hallux left.  She is also complaining of elongated toenails that are discolored.  Objective: Vital signs are stable she is alert oriented x 3 pulses are palpable.  Fifth metatarsal phalangeal joint area of the right foot was severely affected with a pressure ulcer that has resulted in skin breakdown past the level of subcutaneous tissue and toward capsule.  There is no purulence no malodor and there is some granulation tissue present with what appears to be attempted revascularization and epithelialization.  There is no signs of overt infection.  This was sharply debrided with scalpel and tissue nippers.  Greater than 20 cm of tissue was involved.  Hallux plantar medial large area of necrotic tissue skin breakdown hyperkeratotic tissue was removed demonstrates ulcer to the level of the subcutaneous fat with necrosis of the fat and minimal bleeding.  Left hallux only demonstrated some mild hyperkeratotic tissue buildup which was debrided the did not demonstrate ulcerative lesion.  Toenails are long thick yellow dystrophic likely mycotic will be debrided today.  Assessment: Ulceration greater than 20 cm plantar lateral aspect of the right foot deep to subcutaneous tissue and capsule.  Similar finding to the right hallux preulcerative lesion left hallux and painful mycotic nails.  Plan: After sharp debridement of the wound today Silvadene cream Telfa pad dressed a compressive dressing was applied she is to leave this on until tomorrow and then start her daily dressing regimen with Bactroban  ointment applied lightly and a dry sterile dressing with Coban.  She is to keep this dry at all times.  I have encouraged her to stay off of this is much as possible and she will follow-up with Dr. Silva in 1 week or so.  Her nails were  debrided today 1 through 5 bilaterally covered service secondary to diabetes neuropathy peripheral vascular disease and pain.

## 2024-02-17 ENCOUNTER — Ambulatory Visit (INDEPENDENT_AMBULATORY_CARE_PROVIDER_SITE_OTHER)

## 2024-02-17 ENCOUNTER — Ambulatory Visit: Payer: Self-pay | Admitting: Cardiovascular Disease

## 2024-02-17 ENCOUNTER — Ambulatory Visit: Admitting: Podiatry

## 2024-02-17 VITALS — Ht 68.0 in | Wt 247.0 lb

## 2024-02-17 DIAGNOSIS — L97511 Non-pressure chronic ulcer of other part of right foot limited to breakdown of skin: Secondary | ICD-10-CM

## 2024-02-17 DIAGNOSIS — L97512 Non-pressure chronic ulcer of other part of right foot with fat layer exposed: Secondary | ICD-10-CM | POA: Diagnosis not present

## 2024-02-17 DIAGNOSIS — I739 Peripheral vascular disease, unspecified: Secondary | ICD-10-CM

## 2024-02-17 NOTE — Patient Instructions (Signed)

## 2024-02-18 NOTE — Progress Notes (Signed)
 Subjective:  Patient ID: Karen Stephenson, female    DOB: Sep 06, 1955,  MRN: [id number]  Chief Complaint  Patient presents with   Foot Ulcer    Rm 8 Patient is here for ulcer on the right foot.    Discussed the use of AI scribe software for clinical note transcription with the patient, who gave verbal consent to proceed.  History of Present Illness Karen Stephenson is a 68 year old female with circulation issues who presents with non-healing foot ulcers.  She has been experiencing persistent foot wounds since July 2024, which have been recurring and healing intermittently. The wounds tend to worsen, particularly when her husband is hospitalized, as she is the only one in the household.  She is currently using mupirocin  and iodine  solution for wound care. Her big toe wound is almost healed, but there is a persistent ulcer on the lateral side of her foot, specifically under the fifth metatarsal, which is a pressure point. No drainage or signs of infection from the wounds.  Her past medical history includes circulation issues, and she has undergone recent circulation testing. A stent may have narrowed slightly, but it remains open. She has not had any special footwear to alleviate pressure on the affected area.  She denies having any implanted devices such as a pacemaker or spinal cord stimulator and has previously undergone MRIs without any issues. She wears an eleven wide shoe size, and she and her husband have previously been fitted for shoes at a shoe market in [place].      Objective:    Physical Exam VASCULAR: DP and PT pulse palpable. Foot is warm and well-perfused. Capillary fill time is brisk. DERMATOLOGIC: Full thickness ulceration submetatarsal 5 lateral foot with exposed subcutaneous tissue, surrounding hyperkeratosis, no drainage or infection. Nearly fully healed ulcer on medial right hallux. NEUROLOGIC: Normal sensation to light touch and pressure. No paresthesias on  examination. ORTHOPEDIC: Hallux valgus deformity, hammer toes, and pes planus. Smooth pain-free range of motion of all examined joints. No ecchymosis or bruising. No pain to palpation.         Results RADIOLOGY Foot X-ray: Bone spur around the area of the big toe, no osteomyelitis visible.  DIAGNOSTIC Circulation testing: Stent may have narrowed slightly but remains patent.  Procedure: Wound dressing Description: Dressing applied to the big toe and lateral foot ulcer. No debridement performed.   Assessment:   1. Ulcer of right foot with fat layer exposed (HCC)      Plan:  Patient was evaluated and treated and all questions answered.  Assessment and Plan Assessment & Plan Chronic non-healing ulcer of right lateral foot, submetatarsal 5 Full thickness ulceration at submetatarsal 5 on the lateral foot with exposed subcutaneous tissue. No active signs of drainage or infection. The ulcer is not healing due to pressure and anatomical factors. Differential diagnosis includes potential underlying infection, which is not evident on x-ray. Consideration of surgical intervention if conservative measures fail. MRI is needed to rule out infection and assess the need for potential surgical realignment of the fifth metatarsal bone to aid healing. - Order MRI of the right foot to assess for any underlying infection or other issues. - Prescribe surgical shoe with special insole to offload pressure from the ulcer site. - Continue current wound care regimen with mupirocin  and iodine  solution until MRI results are available. - Offload wound site with surgical shoe and peg assist device this was dispensed today to offload the ulceration  Nearly healed ulcer of  right medial hallux Ulcer on the medial right hallux is nearly fully healed. Minimal intervention required at this stage.  Hallux valgus deformity, right foot Presence of hallux valgus deformity. No immediate intervention required as it is  not currently contributing to ulceration or other complications.  Hammer toes, right foot Hammer toes present on the right foot. No immediate intervention required as they are not currently contributing to ulceration or other complications.  Pes planus, right foot Pes planus noted on the right foot. No immediate intervention required as it is not currently contributing to ulceration or other complications.      Return in about 3 weeks (around 03/09/2024) for wound care.

## 2024-02-21 ENCOUNTER — Encounter: Payer: Self-pay | Admitting: Podiatry

## 2024-02-24 ENCOUNTER — Ambulatory Visit
Admission: RE | Admit: 2024-02-24 | Discharge: 2024-02-24 | Disposition: A | Discharge status: Home / Self Care | Source: Ambulatory Visit | Attending: Podiatry

## 2024-02-24 DIAGNOSIS — L97512 Non-pressure chronic ulcer of other part of right foot with fat layer exposed: Secondary | ICD-10-CM

## 2024-03-04 ENCOUNTER — Ambulatory Visit: Admitting: Family Medicine

## 2024-03-04 ENCOUNTER — Encounter: Payer: Self-pay | Admitting: Family Medicine

## 2024-03-04 VITALS — BP 139/74 | HR 84 | Resp 16 | Wt 237.6 lb

## 2024-03-04 DIAGNOSIS — K582 Mixed irritable bowel syndrome: Secondary | ICD-10-CM

## 2024-03-04 DIAGNOSIS — Z23 Encounter for immunization: Secondary | ICD-10-CM

## 2024-03-04 DIAGNOSIS — E7800 Pure hypercholesterolemia, unspecified: Secondary | ICD-10-CM | POA: Diagnosis not present

## 2024-03-04 DIAGNOSIS — E785 Hyperlipidemia, unspecified: Secondary | ICD-10-CM

## 2024-03-04 DIAGNOSIS — J302 Other seasonal allergic rhinitis: Secondary | ICD-10-CM

## 2024-03-04 DIAGNOSIS — J3089 Other allergic rhinitis: Secondary | ICD-10-CM | POA: Diagnosis not present

## 2024-03-04 DIAGNOSIS — E1142 Type 2 diabetes mellitus with diabetic polyneuropathy: Secondary | ICD-10-CM | POA: Diagnosis not present

## 2024-03-04 DIAGNOSIS — I872 Venous insufficiency (chronic) (peripheral): Secondary | ICD-10-CM

## 2024-03-04 DIAGNOSIS — I10 Essential (primary) hypertension: Secondary | ICD-10-CM

## 2024-03-04 DIAGNOSIS — K219 Gastro-esophageal reflux disease without esophagitis: Secondary | ICD-10-CM

## 2024-03-04 DIAGNOSIS — J4520 Mild intermittent asthma, uncomplicated: Secondary | ICD-10-CM

## 2024-03-04 DIAGNOSIS — E1169 Type 2 diabetes mellitus with other specified complication: Secondary | ICD-10-CM

## 2024-03-04 DIAGNOSIS — I200 Unstable angina: Secondary | ICD-10-CM

## 2024-03-04 LAB — POCT GLYCOSYLATED HEMOGLOBIN (HGB A1C): Hemoglobin A1C: 6.5 % — AB (ref 4.0–5.6)

## 2024-03-04 MED ORDER — ROSUVASTATIN CALCIUM 20 MG PO TABS: 20.0000 mg | ORAL_TABLET | Freq: Every day | ORAL | 3 refills | Status: AC

## 2024-03-04 MED ORDER — FLUTICASONE PROPIONATE 50 MCG/ACT NA SUSP: 2.0000 | Freq: Every day | NASAL | 6 refills | Status: AC

## 2024-03-04 MED ORDER — FREESTYLE LIBRE 3 PLUS SENSOR MISC: Status: DC

## 2024-03-04 MED ORDER — PANTOPRAZOLE SODIUM 40 MG PO TBEC
40.0000 mg | DELAYED_RELEASE_TABLET | Freq: Every day | ORAL | 2 refills | Status: AC
Start: 2024-03-04 — End: ?

## 2024-03-04 NOTE — Progress Notes (Signed)
 Established patient visit   Patient: Karen Stephenson   DOB: 1955-07-05   68 y.o. Female  MRN: [id number] Visit Date: 03/04/2024  Today's healthcare provider: Rockie Agent, MD   Chief Complaint  Patient presents with   Medical Management of Chronic Issues    T2DM and HTN Eye exam scheduled 05/25/24 with [facility])   Subjective     HPI     Medical Management of Chronic Issues    Additional comments: T2DM and HTN Eye exam scheduled 05/25/24 with [facility])      Last edited by Rosas, Joseline E, CMA on 03/04/2024  1:59 PM.       Discussed the use of AI scribe software for clinical note transcription with the patient, who gave verbal consent to proceed.  History of Present Illness Karen Stephenson is a 68 year old female with type 2 diabetes and a diabetic foot ulcer who presents for diabetes management and follow-up on her foot ulcer.  Her diabetic foot ulcer is being managed with a special boot to relieve pressure. There is no bleeding. She is scheduled to see her podiatrist on the 17th for further evaluation, including the possibility of bone removal to alleviate pressure.  Her type 2 diabetes management includes Crestor  20 mg daily, Acarbose  25 mg three times daily, Jardiance  10 mg daily, Januvia  100 mg daily, and Losartan  100 mg daily. She rarely checks her blood sugars at home but feels she has been stable without episodes of hypoglycemia or hyperglycemia. Her most recent A1c is 6.5%. She aims for below 6%.  She has a history of hypertension and hyperlipidemia, for which she takes Losartan  100 mg daily, Crestor  20 mg daily, and Hydrochlorothiazide  25 mg daily. She notes a recent lapse in taking Hydrochlorothiazide , which led to leg swelling, but this resolved upon resuming the medication.  She has a history of airway hyperactivity and uses Flonase  nasal spray, preferring two sprays per nostril. She reports no recent  breathing issues, wheezing, or shortness of breath. She uses Advair as needed but has not required it recently.  Her social history includes significant stress due to her husband's health issues and a recent water leak in her home, which required extensive repairs. Her husband is now doing better, which has reduced her stress levels.     Past Medical History:  Diagnosis Date   Allergy    Anginal pain (HCC)    a. 07/2020 MV: No ischemia/infarct-->low risk.   Asthma    Cataract    I seem to remember Dr. Carolee mentioned a very small cararact during one of my visits.   Diabetes mellitus without complication (HCC)    Family history of adverse reaction to anesthesia    SISTER HAS ISSUE DURING COLONOSCOPY   GERD (gastroesophageal reflux disease)    History of echocardiogram    a. 07/2020 Echo: EF 60-65%, no rwma, nl RV fxn, mild Ao sclerosis w/o stenosis.   Hypercholesteremia    Hypertension    Hypokalemia    Neuromuscular disorder (HCC)    Neuropathy diabetes related   PAD (peripheral artery disease) (HCC)    a. 08/2021 LE Angio/PTA: Heavily calcified short occlusion of the R CIA (s/p balloon expandable covered stent), mild nonobstructive infrainguinal disease.  Mild left SFA disease.  Borderline significant L popliteal disease with three-vessel runoff below the knee.   S/P bilateral salpingo-oophorectomy 08/18/2020   S/P laparoscopic hysterectomy 08/18/2020   Sleep apnea  WAS NOT GIVEN CPAP    Medications: Outpatient Medications Prior to Visit  Medication Sig Note   acarbose  (PRECOSE ) 25 MG tablet TAKE 1 TABLET BY MOUTH THREE TIMES DAILY WITH MEALS    albuterol  (VENTOLIN  HFA) 108 (90 Base) MCG/ACT inhaler Inhale 2 puffs into the lungs every 6 (six) hours as needed for wheezing or shortness of breath.    cetirizine (ZYRTEC) 10 MG tablet Take 10 mg by mouth daily.    clopidogrel  (PLAVIX ) 75 MG tablet Take 1 tablet by mouth once daily    fluticasone -salmeterol (ADVAIR DISKUS) 250-50  MCG/ACT AEPB Inhale 1 puff into the lungs in the morning and at bedtime.    glucose blood test strip Check sugar once daily, E 11.9    Homeopathic Products (LEG CRAMP RELIEF PO) Take 2 capsules by mouth daily.     hydrochlorothiazide  (HYDRODIURIL ) 25 MG tablet Take 1 tablet by mouth once daily    hyoscyamine  (LEVBID ) 0.375 MG 12 hr tablet Take 1 tablet (0.375 mg total) by mouth as needed.    JANUVIA  100 MG tablet Take 1 tablet by mouth once daily    Lancets (ONETOUCH ULTRASOFT) lancets Check sugar once daily ,DX E11.9    losartan  (COZAAR ) 100 MG tablet Take 1 tablet by mouth once daily    montelukast  (SINGULAIR ) 10 MG tablet Take 1 tablet (10 mg total) by mouth at bedtime.    Multiple Vitamins-Minerals (MULTIVITAMIN WITH MINERALS) tablet Take 1 tablet by mouth daily.    mupirocin  ointment (BACTROBAN ) 2 % Apply 1 Application topically 2 (two) times daily.    zinc gluconate 50 MG tablet Take 50 mg by mouth daily.    [DISCONTINUED] fluticasone  (FLONASE ) 50 MCG/ACT nasal spray Place 1 spray into both nostrils daily.    [DISCONTINUED] JARDIANCE  10 MG TABS tablet TAKE 1 TABLET BY MOUTH ONCE DAILY BEFORE BREAKFAST 03/04/2024: concerned this worsened foot ulcer   [DISCONTINUED] pantoprazole  (PROTONIX ) 40 MG tablet Take 1 tablet by mouth once daily    [DISCONTINUED] rosuvastatin  (CRESTOR ) 20 MG tablet Take 1 tablet (20 mg total) by mouth daily.    [DISCONTINUED] vitamin B-12 (CYANOCOBALAMIN) 1000 MCG tablet Take 1,000 mcg by mouth daily.    [DISCONTINUED] Vitamin D , Cholecalciferol , 50 MCG (2000 UT) CAPS Take 2,000 Units by mouth daily.    No facility-administered medications prior to visit.    Review of Systems  Last CBC Lab Results  Component Value Date   WBC 6.6 08/23/2021   HGB 13.8 08/23/2021   HCT 42.1 08/23/2021   MCV 84 08/23/2021   MCH 27.5 08/23/2021   RDW 12.9 08/23/2021   PLT 260 08/23/2021   Last metabolic panel Lab Results  Component Value Date   GLUCOSE 133 (H) 12/04/2023    NA 144 12/04/2023   K 3.6 12/04/2023   CL 105 12/04/2023   CO2 23 12/04/2023   BUN 13 12/04/2023   CREATININE 0.82 12/04/2023   EGFR 77 12/04/2023   CALCIUM  9.9 12/04/2023   PROT 7.6 12/04/2023   ALBUMIN 4.4 12/04/2023   LABGLOB 3.2 12/04/2023   AGRATIO 1.4 06/08/2020   BILITOT 0.5 12/04/2023   ALKPHOS 68 12/04/2023   AST 18 12/04/2023   ALT 15 12/04/2023   Last lipids Lab Results  Component Value Date   CHOL 133 12/04/2023   HDL 35 (L) 12/04/2023   LDLCALC 69 12/04/2023   TRIG 173 (H) 12/04/2023   CHOLHDL 3.8 12/04/2023   Last hemoglobin A1c Lab Results  Component Value Date   HGBA1C 6.5 (  A) 03/04/2024   Last thyroid  functions Lab Results  Component Value Date   TSH 2.450 11/22/2021     Lab Results  Component Value Date   VITAMINB12 >2000 (H) 12/04/2023   Last vitamin D  Lab Results  Component Value Date   VD25OH 102.0 (H) 12/04/2023       Objective    BP 139/74 (BP Location: Left Wrist, Patient Position: Sitting, Cuff Size: Normal)   Pulse 84   Resp 16   Wt 237 lb 9.6 oz (107.8 kg)   SpO2 98%   BMI 36.13 kg/m   BP Readings from Last 3 Encounters:  03/04/24 139/74  12/04/23 139/69  10/23/23 110/68   Wt Readings from Last 3 Encounters:  03/04/24 237 lb 9.6 oz (107.8 kg)  02/17/24 247 lb (112 kg)  12/04/23 247 lb (112 kg)        Physical Exam  General: Alert, no acute distress Cardio: RRR, no r/m/g Pulm: CTAB, normal work of breathing EXTRM: No LE edema, right foot is in postop shoe  Results for orders placed or performed in visit on 03/04/24  POCT glycosylated hemoglobin (Hb A1C)  Result Value Ref Range   Hemoglobin A1C 6.5 (A) 4.0 - 5.6 %    Assessment & Plan     Problem List Items Addressed This Visit     Acid reflux   Gastroesophageal reflux disease Chronic  - Continue Protonix  40 mg daily      Relevant Medications   pantoprazole  (PROTONIX ) 40 MG tablet   Airway hyperreactivity   Airway hyperactivity and seasonal  allergic rhinitis Breathing is stable with no recent wheezing. Flonase  dosage was adjusted due to a previous prescription error. - Adjust Flonase  to two sprays per nostril - Continue Singulair  as prescribed      Allergic rhinitis, seasonal   Chronic, stable Managed with Zyrtec and Flonase . - Continue Zyrtec 10 mg daily - Continue Flonase  nasal spray as needed      Relevant Medications   fluticasone  (FLONASE ) 50 MCG/ACT nasal spray   Chronic venous insufficiency   Chronic venous insufficiency Leg swelling likely related to missed doses of hydrochlorothiazide , improved with medication resumption.       Relevant Medications   rosuvastatin  (CRESTOR ) 20 MG tablet   Diabetes mellitus, type 2 (HCC) - Primary   Type 2 diabetes mellitus with diabetic polyneuropathy and diabetic foot ulcer Diabetes is well-controlled with an A1c of 6.5, aiming for below 6. The diabetic foot ulcer is managed by podiatry, with potential bone removal to relieve pressure. Concerns about Jardiance 's side effects on limbs were discussed, and a trial of Rybelsus is planned to assess its impact on the foot condition. - Continue Crestor  20 mg daily - Continue Acarbose  25 mg three times daily - Continue Januvia  100 mg daily - Discontinue Jardiance  (trial off of medication to see if this helps with foot wound, pt concerned that symptoms with foot ulcer started after being on this medication) - considered trial of rybelsus but concerned for hypoglycemia with current use of Januvia , so will not start this at this time - Provided samples of Freestyle Libre 3 for continuous glucose monitoring - Schedule virtual follow-up in 6 weeks to assess blood sugar control and foot condition      Relevant Medications   rosuvastatin  (CRESTOR ) 20 MG tablet   Continuous Glucose Sensor (FREESTYLE LIBRE 3 PLUS SENSOR) MISC   Other Relevant Orders   POCT glycosylated hemoglobin (Hb A1C) (Completed)   Essential (primary) hypertension  Essential hypertension Chronic Blood pressure is 139/74, slightly elevated, possibly due to stress and missed doses of hydrochlorothiazide . - Continue Losartan  100 mg daily - Continue Hydrochlorothiazide  25 mg daily - Refill Hydrochlorothiazide  prescription      Relevant Medications   rosuvastatin  (CRESTOR ) 20 MG tablet   Hyperlipidemia associated with type 2 diabetes mellitus (HCC)   Hyperlipidemia Chronic hyperlipidemia is managed with Crestor . - Continue Crestor  20 mg daily - Refill Crestor  prescription      Relevant Medications   rosuvastatin  (CRESTOR ) 20 MG tablet   Irritable bowel syndrome with both constipation and diarrhea   Chronic  Intermittent symptoms   Potential side effects of Rybelsus on bowel habits were discussed.      Relevant Medications   pantoprazole  (PROTONIX ) 40 MG tablet   Unstable angina pectoris (HCC)   Managed with Plavix . No recent chest pain. Stent placed in leg for circulation issues. - Continue Plavix  75 mg daily      Relevant Medications   rosuvastatin  (CRESTOR ) 20 MG tablet   Other Visit Diagnoses       Immunization due       Relevant Orders   Flu vaccine HIGH DOSE PF(Fluzone Trivalent) (Completed)     Hypercholesteremia       Relevant Medications   rosuvastatin  (CRESTOR ) 20 MG tablet      Patient received influenza vaccine today   Assessment and Plan Assessment & Plan     Vitamin D  deficiency Vitamin D  levels were previously high, and supplementation was stopped. Focus is on dietary intake.       Return in about 8 weeks (around 04/29/2024) for DM, after jardiance  hold .         Rockie Agent, MD  [facility] [phone] (phone) [phone] (fax)  [facility]

## 2024-03-04 NOTE — H&P (View-Only) (Signed)
 Established patient visit   Patient: Karen Stephenson   DOB: 1955-07-05   68 y.o. Female  MRN: [id number] Visit Date: 03/04/2024  Today's healthcare provider: Rockie Agent, MD   Chief Complaint  Patient presents with   Medical Management of Chronic Issues    T2DM and HTN Eye exam scheduled 05/25/24 with [facility])   Subjective     HPI     Medical Management of Chronic Issues    Additional comments: T2DM and HTN Eye exam scheduled 05/25/24 with [facility])      Last edited by Rosas, Joseline E, CMA on 03/04/2024  1:59 PM.       Discussed the use of AI scribe software for clinical note transcription with the patient, who gave verbal consent to proceed.  History of Present Illness Karen Stephenson is a 68 year old female with type 2 diabetes and a diabetic foot ulcer who presents for diabetes management and follow-up on her foot ulcer.  Her diabetic foot ulcer is being managed with a special boot to relieve pressure. There is no bleeding. She is scheduled to see her podiatrist on the 17th for further evaluation, including the possibility of bone removal to alleviate pressure.  Her type 2 diabetes management includes Crestor  20 mg daily, Acarbose  25 mg three times daily, Jardiance  10 mg daily, Januvia  100 mg daily, and Losartan  100 mg daily. She rarely checks her blood sugars at home but feels she has been stable without episodes of hypoglycemia or hyperglycemia. Her most recent A1c is 6.5%. She aims for below 6%.  She has a history of hypertension and hyperlipidemia, for which she takes Losartan  100 mg daily, Crestor  20 mg daily, and Hydrochlorothiazide  25 mg daily. She notes a recent lapse in taking Hydrochlorothiazide , which led to leg swelling, but this resolved upon resuming the medication.  She has a history of airway hyperactivity and uses Flonase  nasal spray, preferring two sprays per nostril. She reports no recent  breathing issues, wheezing, or shortness of breath. She uses Advair as needed but has not required it recently.  Her social history includes significant stress due to her husband's health issues and a recent water leak in her home, which required extensive repairs. Her husband is now doing better, which has reduced her stress levels.     Past Medical History:  Diagnosis Date   Allergy    Anginal pain (HCC)    a. 07/2020 MV: No ischemia/infarct-->low risk.   Asthma    Cataract    I seem to remember Dr. Carolee mentioned a very small cararact during one of my visits.   Diabetes mellitus without complication (HCC)    Family history of adverse reaction to anesthesia    SISTER HAS ISSUE DURING COLONOSCOPY   GERD (gastroesophageal reflux disease)    History of echocardiogram    a. 07/2020 Echo: EF 60-65%, no rwma, nl RV fxn, mild Ao sclerosis w/o stenosis.   Hypercholesteremia    Hypertension    Hypokalemia    Neuromuscular disorder (HCC)    Neuropathy diabetes related   PAD (peripheral artery disease) (HCC)    a. 08/2021 LE Angio/PTA: Heavily calcified short occlusion of the R CIA (s/p balloon expandable covered stent), mild nonobstructive infrainguinal disease.  Mild left SFA disease.  Borderline significant L popliteal disease with three-vessel runoff below the knee.   S/P bilateral salpingo-oophorectomy 08/18/2020   S/P laparoscopic hysterectomy 08/18/2020   Sleep apnea  WAS NOT GIVEN CPAP    Medications: Outpatient Medications Prior to Visit  Medication Sig Note   acarbose  (PRECOSE ) 25 MG tablet TAKE 1 TABLET BY MOUTH THREE TIMES DAILY WITH MEALS    albuterol  (VENTOLIN  HFA) 108 (90 Base) MCG/ACT inhaler Inhale 2 puffs into the lungs every 6 (six) hours as needed for wheezing or shortness of breath.    cetirizine (ZYRTEC) 10 MG tablet Take 10 mg by mouth daily.    clopidogrel  (PLAVIX ) 75 MG tablet Take 1 tablet by mouth once daily    fluticasone -salmeterol (ADVAIR DISKUS) 250-50  MCG/ACT AEPB Inhale 1 puff into the lungs in the morning and at bedtime.    glucose blood test strip Check sugar once daily, E 11.9    Homeopathic Products (LEG CRAMP RELIEF PO) Take 2 capsules by mouth daily.     hydrochlorothiazide  (HYDRODIURIL ) 25 MG tablet Take 1 tablet by mouth once daily    hyoscyamine  (LEVBID ) 0.375 MG 12 hr tablet Take 1 tablet (0.375 mg total) by mouth as needed.    JANUVIA  100 MG tablet Take 1 tablet by mouth once daily    Lancets (ONETOUCH ULTRASOFT) lancets Check sugar once daily ,DX E11.9    losartan  (COZAAR ) 100 MG tablet Take 1 tablet by mouth once daily    montelukast  (SINGULAIR ) 10 MG tablet Take 1 tablet (10 mg total) by mouth at bedtime.    Multiple Vitamins-Minerals (MULTIVITAMIN WITH MINERALS) tablet Take 1 tablet by mouth daily.    mupirocin  ointment (BACTROBAN ) 2 % Apply 1 Application topically 2 (two) times daily.    zinc gluconate 50 MG tablet Take 50 mg by mouth daily.    [DISCONTINUED] fluticasone  (FLONASE ) 50 MCG/ACT nasal spray Place 1 spray into both nostrils daily.    [DISCONTINUED] JARDIANCE  10 MG TABS tablet TAKE 1 TABLET BY MOUTH ONCE DAILY BEFORE BREAKFAST 03/04/2024: concerned this worsened foot ulcer   [DISCONTINUED] pantoprazole  (PROTONIX ) 40 MG tablet Take 1 tablet by mouth once daily    [DISCONTINUED] rosuvastatin  (CRESTOR ) 20 MG tablet Take 1 tablet (20 mg total) by mouth daily.    [DISCONTINUED] vitamin B-12 (CYANOCOBALAMIN) 1000 MCG tablet Take 1,000 mcg by mouth daily.    [DISCONTINUED] Vitamin D , Cholecalciferol , 50 MCG (2000 UT) CAPS Take 2,000 Units by mouth daily.    No facility-administered medications prior to visit.    Review of Systems  Last CBC Lab Results  Component Value Date   WBC 6.6 08/23/2021   HGB 13.8 08/23/2021   HCT 42.1 08/23/2021   MCV 84 08/23/2021   MCH 27.5 08/23/2021   RDW 12.9 08/23/2021   PLT 260 08/23/2021   Last metabolic panel Lab Results  Component Value Date   GLUCOSE 133 (H) 12/04/2023    NA 144 12/04/2023   K 3.6 12/04/2023   CL 105 12/04/2023   CO2 23 12/04/2023   BUN 13 12/04/2023   CREATININE 0.82 12/04/2023   EGFR 77 12/04/2023   CALCIUM  9.9 12/04/2023   PROT 7.6 12/04/2023   ALBUMIN 4.4 12/04/2023   LABGLOB 3.2 12/04/2023   AGRATIO 1.4 06/08/2020   BILITOT 0.5 12/04/2023   ALKPHOS 68 12/04/2023   AST 18 12/04/2023   ALT 15 12/04/2023   Last lipids Lab Results  Component Value Date   CHOL 133 12/04/2023   HDL 35 (L) 12/04/2023   LDLCALC 69 12/04/2023   TRIG 173 (H) 12/04/2023   CHOLHDL 3.8 12/04/2023   Last hemoglobin A1c Lab Results  Component Value Date   HGBA1C 6.5 (  A) 03/04/2024   Last thyroid  functions Lab Results  Component Value Date   TSH 2.450 11/22/2021     Lab Results  Component Value Date   VITAMINB12 >2000 (H) 12/04/2023   Last vitamin D  Lab Results  Component Value Date   VD25OH 102.0 (H) 12/04/2023       Objective    BP 139/74 (BP Location: Left Wrist, Patient Position: Sitting, Cuff Size: Normal)   Pulse 84   Resp 16   Wt 237 lb 9.6 oz (107.8 kg)   SpO2 98%   BMI 36.13 kg/m   BP Readings from Last 3 Encounters:  03/04/24 139/74  12/04/23 139/69  10/23/23 110/68   Wt Readings from Last 3 Encounters:  03/04/24 237 lb 9.6 oz (107.8 kg)  02/17/24 247 lb (112 kg)  12/04/23 247 lb (112 kg)        Physical Exam  General: Alert, no acute distress Cardio: RRR, no r/m/g Pulm: CTAB, normal work of breathing EXTRM: No LE edema, right foot is in postop shoe  Results for orders placed or performed in visit on 03/04/24  POCT glycosylated hemoglobin (Hb A1C)  Result Value Ref Range   Hemoglobin A1C 6.5 (A) 4.0 - 5.6 %    Assessment & Plan     Problem List Items Addressed This Visit     Acid reflux   Gastroesophageal reflux disease Chronic  - Continue Protonix  40 mg daily      Relevant Medications   pantoprazole  (PROTONIX ) 40 MG tablet   Airway hyperreactivity   Airway hyperactivity and seasonal  allergic rhinitis Breathing is stable with no recent wheezing. Flonase  dosage was adjusted due to a previous prescription error. - Adjust Flonase  to two sprays per nostril - Continue Singulair  as prescribed      Allergic rhinitis, seasonal   Chronic, stable Managed with Zyrtec and Flonase . - Continue Zyrtec 10 mg daily - Continue Flonase  nasal spray as needed      Relevant Medications   fluticasone  (FLONASE ) 50 MCG/ACT nasal spray   Chronic venous insufficiency   Chronic venous insufficiency Leg swelling likely related to missed doses of hydrochlorothiazide , improved with medication resumption.       Relevant Medications   rosuvastatin  (CRESTOR ) 20 MG tablet   Diabetes mellitus, type 2 (HCC) - Primary   Type 2 diabetes mellitus with diabetic polyneuropathy and diabetic foot ulcer Diabetes is well-controlled with an A1c of 6.5, aiming for below 6. The diabetic foot ulcer is managed by podiatry, with potential bone removal to relieve pressure. Concerns about Jardiance 's side effects on limbs were discussed, and a trial of Rybelsus is planned to assess its impact on the foot condition. - Continue Crestor  20 mg daily - Continue Acarbose  25 mg three times daily - Continue Januvia  100 mg daily - Discontinue Jardiance  (trial off of medication to see if this helps with foot wound, pt concerned that symptoms with foot ulcer started after being on this medication) - considered trial of rybelsus but concerned for hypoglycemia with current use of Januvia , so will not start this at this time - Provided samples of Freestyle Libre 3 for continuous glucose monitoring - Schedule virtual follow-up in 6 weeks to assess blood sugar control and foot condition      Relevant Medications   rosuvastatin  (CRESTOR ) 20 MG tablet   Continuous Glucose Sensor (FREESTYLE LIBRE 3 PLUS SENSOR) MISC   Other Relevant Orders   POCT glycosylated hemoglobin (Hb A1C) (Completed)   Essential (primary) hypertension  Essential hypertension Chronic Blood pressure is 139/74, slightly elevated, possibly due to stress and missed doses of hydrochlorothiazide . - Continue Losartan  100 mg daily - Continue Hydrochlorothiazide  25 mg daily - Refill Hydrochlorothiazide  prescription      Relevant Medications   rosuvastatin  (CRESTOR ) 20 MG tablet   Hyperlipidemia associated with type 2 diabetes mellitus (HCC)   Hyperlipidemia Chronic hyperlipidemia is managed with Crestor . - Continue Crestor  20 mg daily - Refill Crestor  prescription      Relevant Medications   rosuvastatin  (CRESTOR ) 20 MG tablet   Irritable bowel syndrome with both constipation and diarrhea   Chronic  Intermittent symptoms   Potential side effects of Rybelsus on bowel habits were discussed.      Relevant Medications   pantoprazole  (PROTONIX ) 40 MG tablet   Unstable angina pectoris (HCC)   Managed with Plavix . No recent chest pain. Stent placed in leg for circulation issues. - Continue Plavix  75 mg daily      Relevant Medications   rosuvastatin  (CRESTOR ) 20 MG tablet   Other Visit Diagnoses       Immunization due       Relevant Orders   Flu vaccine HIGH DOSE PF(Fluzone Trivalent) (Completed)     Hypercholesteremia       Relevant Medications   rosuvastatin  (CRESTOR ) 20 MG tablet      Patient received influenza vaccine today   Assessment and Plan Assessment & Plan     Vitamin D  deficiency Vitamin D  levels were previously high, and supplementation was stopped. Focus is on dietary intake.       Return in about 8 weeks (around 04/29/2024) for DM, after jardiance  hold .         Rockie Agent, MD  [facility] [phone] (phone) [phone] (fax)  [facility]

## 2024-03-05 ENCOUNTER — Telehealth: Payer: Self-pay

## 2024-03-05 ENCOUNTER — Encounter: Payer: Self-pay | Admitting: Podiatry

## 2024-03-05 NOTE — Telephone Encounter (Signed)
 Yes I believe it is we have no large here she stated she wanted to speak to someone in [place] office.

## 2024-03-05 NOTE — Telephone Encounter (Signed)
 Spoke to patient -she will come by the [place] office before 5 today and I will offload the new Peg Assist

## 2024-03-05 NOTE — Telephone Encounter (Signed)
 Patient called and left a message - states that the insert we put in her boot has split and she needs a new one. Was this a Peg Assist? Patient wants to come to the office to get a new one [employer]/support/administrative officer)

## 2024-03-05 NOTE — Telephone Encounter (Unsigned)
 Copied from CRM [id number]. Topic: General - Other >> Mar 04, 2024  3:30 PM Geneva B wrote: Reason for CRM: patient is calling in she has some questions about her summary from her visit today please call patient back  [phone] (M)

## 2024-03-06 NOTE — Assessment & Plan Note (Signed)
 Essential hypertension Chronic Blood pressure is 139/74, slightly elevated, possibly due to stress and missed doses of hydrochlorothiazide . - Continue Losartan  100 mg daily - Continue Hydrochlorothiazide  25 mg daily - Refill Hydrochlorothiazide  prescription

## 2024-03-06 NOTE — Assessment & Plan Note (Signed)
 Airway hyperactivity and seasonal allergic rhinitis Breathing is stable with no recent wheezing. Flonase  dosage was adjusted due to a previous prescription error. - Adjust Flonase  to two sprays per nostril - Continue Singulair  as prescribed

## 2024-03-06 NOTE — Assessment & Plan Note (Signed)
 Chronic  Intermittent symptoms   Potential side effects of Rybelsus on bowel habits were discussed.

## 2024-03-06 NOTE — Assessment & Plan Note (Addendum)
 Type 2 diabetes mellitus with diabetic polyneuropathy and diabetic foot ulcer Diabetes is well-controlled with an A1c of 6.5, aiming for below 6. The diabetic foot ulcer is managed by podiatry, with potential bone removal to relieve pressure. Concerns about Jardiance 's side effects on limbs were discussed, and a trial of Rybelsus is planned to assess its impact on the foot condition. - Continue Crestor  20 mg daily - Continue Acarbose  25 mg three times daily - Continue Januvia  100 mg daily - Discontinue Jardiance  (trial off of medication to see if this helps with foot wound, pt concerned that symptoms with foot ulcer started after being on this medication) - considered trial of rybelsus but concerned for hypoglycemia with current use of Januvia , so will not start this at this time - Provided samples of Freestyle Libre 3 for continuous glucose monitoring - Schedule virtual follow-up in 6 weeks to assess blood sugar control and foot condition

## 2024-03-06 NOTE — Assessment & Plan Note (Signed)
 Hyperlipidemia Chronic hyperlipidemia is managed with Crestor . - Continue Crestor  20 mg daily - Refill Crestor  prescription

## 2024-03-06 NOTE — Assessment & Plan Note (Signed)
 Chronic venous insufficiency Leg swelling likely related to missed doses of hydrochlorothiazide , improved with medication resumption.

## 2024-03-06 NOTE — Assessment & Plan Note (Signed)
 Managed with Plavix . No recent chest pain. Stent placed in leg for circulation issues. - Continue Plavix  75 mg daily

## 2024-03-06 NOTE — Assessment & Plan Note (Signed)
 Chronic, stable Managed with Zyrtec and Flonase. - Continue Zyrtec 10 mg daily - Continue Flonase nasal spray as needed

## 2024-03-06 NOTE — Assessment & Plan Note (Signed)
 Gastroesophageal reflux disease Chronic  - Continue Protonix  40 mg daily

## 2024-03-18 ENCOUNTER — Ambulatory Visit: Admitting: Podiatry

## 2024-03-18 VITALS — Ht 68.0 in | Wt 237.6 lb

## 2024-03-18 DIAGNOSIS — L97512 Non-pressure chronic ulcer of other part of right foot with fat layer exposed: Secondary | ICD-10-CM | POA: Diagnosis not present

## 2024-03-18 DIAGNOSIS — M869 Osteomyelitis, unspecified: Secondary | ICD-10-CM

## 2024-03-20 ENCOUNTER — Telehealth: Payer: Self-pay | Admitting: Podiatry

## 2024-03-20 NOTE — Telephone Encounter (Signed)
 DOS- 03/30/2024  SKIN SUBSTITUTE GRAFT RT- 15275 OPEN SUPERFICIAL BONE BIOPSY RT- 20240 DEBRIDEMENT OF WOUND RT- 11042  [employer] EFFECTIVE DATE- 07/03/2019  DEDUCTIBLE- N/A OOP- $3300 REMAINING- $2909.78 COINSURANCE- 0%  PER [facility] WEBSITE, NO PRIOR AUTHS ARE REQUIRED FOR CPT CODES 84724, [id number], AND 11042. DOCUMENTATION ATTACHED TO SURGERY CONSENT PACKET.

## 2024-03-21 NOTE — Progress Notes (Signed)
 Subjective:  Patient ID: Karen Stephenson, female    DOB: Jan 25, 1956,  MRN: [id number]  Chief Complaint  Patient presents with   Wound Check    Rm 7 Patient is here for wound care of the right foot and right hallux. Ulcers are scabbed over with moderate drainage.    Discussed the use of AI scribe software for clinical note transcription with the patient, who gave verbal consent to proceed.  History of Present Illness Karen Stephenson is a 68 year old female with circulation issues who presents with non-healing foot ulcers.  She returns for follow-up after completing the MRI.      Objective:    Physical Exam VASCULAR: DP and PT pulse palpable. Foot is warm and well-perfused. Capillary fill time is brisk. DERMATOLOGIC: Full thickness ulceration submetatarsal 5 lateral foot with exposed subcutaneous tissue, surrounding hyperkeratosis, no drainage or infection. Nearly fully healed ulcer on medial right hallux. NEUROLOGIC: Normal sensation to light touch and pressure. No paresthesias on examination. ORTHOPEDIC: Hallux valgus deformity, hammer toes, and pes planus. Smooth pain-free range of motion of all examined joints. No ecchymosis or bruising. No pain to palpation.       Results RADIOLOGY Narrative & Impression  CLINICAL DATA:  Ulceration of the right foot, no known injury, evaluate for osteomyelitis   EXAM: MRI OF THE RIGHT FOREFOOT WITHOUT CONTRAST   TECHNIQUE: Multiplanar, multisequence MR imaging of the right foot was performed. No intravenous contrast was administered.   COMPARISON:  None Available.   FINDINGS: Bones/Joint/Cartilage   Soft tissue wound of the great toe. Severe bone marrow edema throughout the first distal phalanx with cortical irregularity concerning for osteomyelitis.   Moderate osteoarthritis of the first MTP joint. Moderate arthritic changes of the medialis sesamoid-metatarsal articulation with subchondral marrow edema in the medial hallux  sesamoid.   No acute fracture or dislocation. Normal alignment. No joint effusion.   Ligaments   Collateral ligaments are intact.  Lisfranc ligament is intact.   Muscles and Tendons   Flexor, peroneal and extensor compartment tendons are intact. Generalized muscle atrophy.   Soft tissue No fluid collection or hematoma.  No soft tissue mass.   IMPRESSION: 1. Soft tissue wound of the great toe. Severe bone marrow edema throughout the first distal phalanx with cortical irregularity concerning for osteomyelitis. 2. Moderate osteoarthritis of the first MTP joint. 3. Moderate arthritic changes of the medialis sesamoid-metatarsal articulation with subchondral marrow edema in the medial hallux sesamoid.     Electronically Signed   By: Julaine Blanch M.D.   On: 02/24/2024 12:46       Assessment:   1. Ulcer of right foot with fat layer exposed (HCC)   2. Osteomyelitis of great toe of right foot (HCC)      Plan:  Patient was evaluated and treated and all questions answered.  Assessment and Plan Assessment & Plan Chronic non-healing ulcer of right lateral foot, submetatarsal 5 We reviewed the results of the MRI findings that is concerning for osteomyelitis.  We discussed treatment of this that definitive treatment for osteomyelitis in a toe is typically amputation.  We discussed that treatment alone with antibiotics without targeted therapy with a biopsy is a poor success rate.  At this time she does not want amputation.  I recommended then that we proceed with debridement and bone biopsy, application of a skin substitute to the ulcerations and utilize this for treatment and referral with infectious disease.  She will follow-up with me after surgery.  Referral to infectious ease has been placed.  All questions addressed.  Informed consent signed and reviewed.   Surgical plan:  Procedure: - Right hallux and fifth metatarsal bone biopsy, application of skin  substitute  Location: - [facility]  Anesthesia plan: - Sedation with local  Postoperative pain plan: - Tylenol  1000 mg every 6 hours   DVT prophylaxis: - None required  WB Restrictions / DME needs: - WBAT in surgical shoe postop       Return for after surgery.

## 2024-03-22 LAB — WOUND CULTURE
MICRO NUMBER:: 16980165
RESULT:: NO GROWTH
SPECIMEN QUALITY:: ADEQUATE

## 2024-03-24 ENCOUNTER — Encounter
Admission: RE | Admit: 2024-03-24 | Discharge: 2024-03-24 | Disposition: A | Discharge status: Home / Self Care | Source: Ambulatory Visit | Attending: Podiatry | Admitting: Podiatry

## 2024-03-24 ENCOUNTER — Telehealth: Payer: Self-pay | Admitting: *Deleted

## 2024-03-24 ENCOUNTER — Other Ambulatory Visit: Payer: Self-pay

## 2024-03-24 ENCOUNTER — Telehealth: Payer: Self-pay | Admitting: Podiatry

## 2024-03-24 DIAGNOSIS — I10 Essential (primary) hypertension: Secondary | ICD-10-CM

## 2024-03-24 DIAGNOSIS — Z01812 Encounter for preprocedural laboratory examination: Secondary | ICD-10-CM

## 2024-03-24 DIAGNOSIS — E119 Type 2 diabetes mellitus without complications: Secondary | ICD-10-CM

## 2024-03-24 HISTORY — DX: Osteomyelitis, unspecified: M86.9

## 2024-03-24 HISTORY — DX: Non-pressure chronic ulcer of other part of right foot with fat layer exposed: L97.512

## 2024-03-24 HISTORY — DX: Unspecified osteoarthritis, unspecified site: M19.90

## 2024-03-24 NOTE — Telephone Encounter (Signed)
 Spoke to rep from [employer] letting pt know that shara is not required for pts upcoming procedure on 03/30/2024. I also left a VM on pts number with my direct number if she has any questions.

## 2024-03-24 NOTE — Telephone Encounter (Signed)
-----   Message from Dorise CHARLENA Pereyra sent at 03/24/2024  4:34 PM EDT ----- Regarding: Request for pre-operative cardiac clearance Request for pre-operative cardiac clearance:  1. What type of surgery is being performed?  APPLICATION, ALLOGRAFT, SKIN; DEBRIDEMENT, WOUND    2. When is this surgery scheduled?  03/30/2024  3. Type of clearance being requested (medical, pharmacy, both)? BOTH   4. Are there any medications that need to be held prior to surgery? CLOPIDOGREL   5. Practice name and name of physician performing surgery?  Performing surgeon: Dr. Juliene Medicine, DPM Requesting clearance: Dorise Pereyra, FNP-C    6. Anesthesia type (none, local, MAC, general)? IV sedation  7. What is the office phone and fax number?   Fax: [phone]  ATTENTION: Unable to create telephone message as per your standard workflow. Directed by [facility] providers to send requests for cardiac clearance to this pool for appropriate distribution to provider covering pre-operative clearances.   Dorise Pereyra, MSN, APRN, FNP-C, CEN [facility]  Peri-operative Services Nurse Practitioner Phone: [phone] 03/24/24 4:34 PM

## 2024-03-24 NOTE — Patient Instructions (Addendum)
 Your procedure is scheduled on: 03/30/24 - Monday Report to the Registration Desk on the 1st floor of the [facility]. To find out your arrival time, please call [phone] between 1PM - 3PM on: 03/27/24 - Friday If your arrival time is 6:00 am, do not arrive before that time as the [facility] entrance doors do not open until 6:00 am.  REMEMBER: Instructions that are not followed completely may result in serious medical risk, up to and including death; or upon the discretion of your surgeon and anesthesiologist your surgery may need to be rescheduled.  Do not eat food or drink any liquids after midnight the night before surgery.  No gum chewing or hard candies.  One week prior to surgery: Stop Anti-inflammatories (NSAIDS) such as Advil, Aleve, Ibuprofen, Motrin, Naproxen, Naprosyn and Aspirin  based products such as Excedrin, Goody's Powder, BC Powder. You may take Tylenol  if needed for pain up until the day of surgery.  Stop ANY OVER THE COUNTER supplements until after surgery : LEG CRAMP RELIEF.  clopidogrel  (PLAVIX ) - hold beginning 03/26/24, resume with doctors order.  ON THE DAY OF SURGERY ONLY TAKE THESE MEDICATIONS WITH SIPS OF WATER:  pantoprazole  (PROTONIX )  fluticasone  (FLONASE )   Use inhalers albuterol  (VENTOLIN  HFA) on the day of surgery and bring to the hospital.   No Alcohol for 24 hours before or after surgery.  No Smoking including e-cigarettes for 24 hours before surgery.  No chewable tobacco products for at least 6 hours before surgery.  No nicotine patches on the day of surgery.  Do not use any recreational drugs for at least a week (preferably 2 weeks) before your surgery.  Please be advised that the combination of cocaine and anesthesia may have negative outcomes, up to and including death. If you test positive for cocaine, your surgery will be cancelled.  On the morning of surgery brush your teeth with toothpaste and water, you may rinse your mouth  with mouthwash if you wish. Do not swallow any toothpaste or mouthwash.  Do not wear jewelry, make-up, hairpins, clips or nail polish.  For welded (permanent) jewelry: bracelets, anklets, waist bands, etc.  Please have this removed prior to surgery.  If it is not removed, there is a chance that hospital personnel will need to cut it off on the day of surgery.  Do not wear lotions, powders, or perfumes.   Do not shave body hair from the neck down 48 hours before surgery.  Contact lenses, hearing aids and dentures may not be worn into surgery.  Do not bring valuables to the hospital. [facility] is not responsible for any missing/lost belongings or valuables.   Notify your doctor if there is any change in your medical condition (cold, fever, infection).  Wear comfortable clothing (specific to your surgery type) to the hospital.  After surgery, you can help prevent lung complications by doing breathing exercises.  Take deep breaths and cough every 1-2 hours. Your doctor may order a device called an Incentive Spirometer to help you take deep breaths.  When coughing or sneezing, hold a pillow firmly against your incision with both hands. This is called "splinting." Doing this helps protect your incision. It also decreases belly discomfort.  If you are being admitted to the hospital overnight, leave your suitcase in the car. After surgery it may be brought to your room.  In case of increased patient census, it may be necessary for you, the patient, to continue your postoperative care in the Same  Day Surgery department.  If you are being discharged the day of surgery, you will not be allowed to drive home. You will need a responsible individual to drive you home and stay with you for 24 hours after surgery.   If you are taking public transportation, you will need to have a responsible individual with you.  Please call the Pre-admissions Testing Dept. at [phone] if you have any  questions about these instructions.  Surgery Visitation Policy:  Patients having surgery or a procedure may have two visitors.  Children under the age of 57 must have an adult with them who is not the patient.  Inpatient Visitation:    Visiting hours are 7 a.m. to 8 p.m. Up to four visitors are allowed at one time in a patient room. The visitors may rotate out with other people during the day.  One visitor age 42 or older may stay with the patient overnight and must be in the room by 8 p.m.   Merchandiser, retail to address health-related social needs:  [email or web address]

## 2024-03-24 NOTE — Telephone Encounter (Signed)
-----   Message from Karen Stephenson sent at 03/24/2024  4:34 PM EDT ----- Regarding: Request for pre-operative cardiac clearance Request for pre-operative cardiac clearance:  1. What type of surgery is being performed?  APPLICATION, ALLOGRAFT, SKIN; DEBRIDEMENT, WOUND    2. When is this surgery scheduled?  03/30/2024  3. Type of clearance being requested (medical, pharmacy, both)? BOTH   4. Are there any medications that need to be held prior to surgery? CLOPIDOGREL   5. Practice name and name of physician performing surgery?  Performing surgeon: Dr. Juliene Medicine, DPM Requesting clearance: Karen Pereyra, FNP-C    6. Anesthesia type (none, local, MAC, general)? IV sedation  7. What is the office phone and fax number?   Fax: [phone]  ATTENTION: Unable to create telephone message as per your standard workflow. Directed by [facility] providers to send requests for cardiac clearance to this pool for appropriate distribution to provider covering pre-operative clearances.   Karen Pereyra, MSN, APRN, FNP-C, CEN [facility]  Peri-operative Services Nurse Practitioner Phone: [phone] 03/24/24 4:34 PM

## 2024-03-24 NOTE — Telephone Encounter (Signed)
   Pre-operative Risk Assessment    Patient Name: Karen Stephenson  DOB: 04/11/1956 MRN: [id number]   Date of last office visit: 10/23/23 MEDFORD MEAGER, FNP Date of next office visit: NONE   Request for Surgical Clearance    Procedure:  APPLICATION, ALLOGRAFT, SKIN; DEBRIDEMENT, WOUND        Date of Surgery:  Clearance 03/30/24                                Surgeon:  DR. Silva, DPM   Surgeon's Group or Practice Name:  [facility]  Phone number:  [phone]  Fax number:  [phone] DORISE PEREYRA, FNP   Type of Clearance Requested:   - Medical  - Pharmacy:  Hold Clopidogrel  (Plavix )     Type of Anesthesia:  IV SEDATION   Additional requests/questions:    [name]   03/24/2024, 4:43 PM

## 2024-03-25 ENCOUNTER — Encounter: Payer: Self-pay | Admitting: Podiatry

## 2024-03-25 NOTE — Progress Notes (Signed)
 Perioperative / Anesthesia Services  Pre-Admission Testing Clinical Review / Pre-Operative Anesthesia Consult  Date: 03/26/24  PATIENT DEMOGRAPHICS: Name: Karen Stephenson DOB: 18-Mar-1956 MRN:   [id number]  Note: Available PAT nursing documentation and vital signs have been reviewed. Clinical nursing staff has updated patient's PMH/PSHx, current medication list, and drug allergies/intolerances to ensure complete and comprehensive history available to assist care teams in MDM as it pertains to the aforementioned surgical procedure and anticipated anesthetic course. Extensive review of available clinical information personally performed. [place] [facility] and PSHx updated with any diagnoses/procedures that  may have been inadvertently omitted during her intake with the pre-admission testing department's nursing staff.  PLANNED SURGICAL PROCEDURE(S):   Case: [id number] Date/Time: 03/30/24 0715   Procedures:      APPLICATION, ALLOGRAFT, SKIN (Right) - IV SEDATION     DEBRIDEMENT, WOUND (Right)   Anesthesia type: Monitor Anesthesia Care   Pre-op diagnosis: L97.512 ULCER RIGHT FOOT WITH FAT LAYER EXPOSED   Location: [facility] OR ROOM 08 / [facility] ORS FOR ANESTHESIA GROUP   Surgeons: Silva Juliene SAUNDERS, DPM        CLINICAL DISCUSSION: Karen Stephenson is a 68 y.o. female who is submitted for pre-surgical anesthesia review and clearance prior to her undergoing the above procedure. Patient is a Current Smoker (quit 06/2013). Pertinent PMH includes: PAD, HTN, HLD, T2DM, asthma, OSAH (no nocturnal PAP therapy), OA, osteomyelitis (RIGHT foot, neuropathy.  Patient is followed by cardiology Marsa, MD). She was last seen in the cardiology clinic on 10/23/2023; notes reviewed. At the time of her clinic visit, patient doing well overall from a cardiovascular perspective. Patient denied any chest pain, shortness of breath, PND, orthopnea, palpitations, significant peripheral edema, weakness, fatigue, vertiginous symptoms,  or presyncope/syncope. Patient with a past medical history significant for cardiovascular diagnoses. Documented physical exam was grossly benign, providing no evidence of acute exacerbation and/or decompensation of the patient's known cardiovascular conditions.  Most recent myocardial perfusion imaging study was performed on 07/27/2020 .  EF appeared visually normal, however calculated at 40%.  There was T wave inversion noted during stress in leads III, aVF, and V1-V6.  Inferior and anterolateral T wave flattening noted throughout the study following Lexiscan  administration. There were no regional wall motion abnormalities.  No artifact or left ventricular cavity size enlargement appreciated on review of imaging. SPECT images demonstrated no evidence of stress-induced myocardial ischemia or arrhythmia; no scintigraphic evidence of scar.  TID ratio = 1.03 (normal range </= 1.2). Study determined to be normal and low risk.  Most recent TTE performed on 07/28/2020 revealed a normal left ventricular systolic function with an EF of 60-65%. There were no regional wall motion abnormalities.  Left ventricular diastolic Doppler parameters were indeterminant.  Right ventricular size and function normal with a TAPSE measuring 1.3 cm  (normal range >/= 1.6 cm).  RVSP = 19.6 mmHg. aortic valve sclerotic with mild calcification.  There was no significant valvular regurgitation.  All transvalvular gradients were noted to be normal providing no evidence of hemodynamically significant valvular stenosis. Aorta normal in size with no evidence of ectasia or aneurysmal dilatation.  Patient has a known history of PVD.SABRA  Most recent ABI with TBI study on 02/10/2024 that demonstrated mild BILATERAL lower extremity disease.  RIGHT ABI = 0.84 (TBI = 0.92) and LEFT ABI = 0.83 (TBI = 1.19).  Given her PVD, patient is on chronic daily antithrombotic therapy using clopidogrel .  Patient reported to be compliant with therapy with no  evidence or reports  of GI/GU related bleeding.  Blood pressure well controlled at 110/68 mmHg on currently prescribed diuretic (HCTZ) and ARB (losartan ) therapies.  Patient is on rosuvastatin  for her HLD diagnosis and ASCVD prevention.  T2DM well-controlled on currently prescribed regimen; last HgbA1c was 6.8% when checked on 12/04/2023.  Of note, A1c rechecked on 03/04/2024 with further improvement down to  6.5% noted.  She does have an OSAH diagnosis, however does not require the use of nocturnal PAP therapy. Patient is able to complete all of her  ADL/IADLs without cardiovascular limitation.  Per the DASI, patient is able to achieve at least 4 METS of physical activity without experiencing any significant degree of angina/anginal equivalent symptoms. No changes were made to her medication regimen during her visit with cardiology.  Patient scheduled to follow-up with outpatient cardiology in 12 months or sooner if needed.  Karen Stephenson is scheduled for an elective APPLICATION, ALLOGRAFT, SKIN (Right); DEBRIDEMENT, WOUND (Right) on 03/30/2024 with Dr. Juliene JONELLE Medicine, DPM. Given patient's past medical history significant for cardiovascular diagnoses, presurgical cardiac clearance was sought by the PAT team.  Per cardiology, according to the Revised Cardiac Risk Index (RCRI), her Perioperative Risk of Major Cardiac Event is (%): 0.4. Her Functional Capacity in METs is: 5.07 according to the [facility] Activity Status Index (DASI). Therefore, based on ACC/AHA guidelines, patient would be at ACCEPTABLE risk for the planned procedure without further cardiovascular testing.   Again, this patient is on daily oral antithrombotic therapy. She has been instructed on recommendations for holding her clopidogrel  for 4 days prior to her procedure with plans to restart as soon as postoperative bleeding risk felt to be minimized by his primary attending surgeon. The patient has been instructed that her last dose should be on  03/25/2024.  Patient denies previous perioperative complications with anesthesia in the past.  Patient with a familial history of (+) procedural apnea and PONV in a first-degree relative (sister).  In review her EMR, it is noted that patient underwent a general anesthetic course here at [facility] (AS[facility]) in 08/2020 without documented complications.   MOST RECENT VITAL SIGNS:    03/18/2024    2:29 PM 03/04/2024    1:51 PM 02/17/2024    4:16 PM  Vitals with BMI  Height 5' 8  5' 8  Weight 237 lbs 10 oz 237 lbs 10 oz 247 lbs  BMI 36.14 36.14 37.56  Systolic  139   Diastolic  74   Pulse  84    PROVIDERS/SPECIALISTS: NOTE: Primary physician provider listed below. Patient may have been seen by APP or partner within same practice.   PROVIDER ROLE / SPECIALTY LAST SHERLEAN Medicine Juliene JONELLE, DPM Podiatry (Surgeon) 03/18/2024  Sharma Coyer, MD Primary Care Provider 03/04/2024  Darron Grass, MD Cardiology 10/23/2023; preop APP call 03/26/2024   ALLERGIES: Allergies  Allergen Reactions   Amoxicillin -Pot Clavulanate Diarrhea   Aspirin      Higher doses - passed out    Ducodyl [Bisacodyl]     Passed out, extreme cramping    Erythromycin     Stomach Ache Stomach pain.   Glipizide     Extreme stomach aches    Metformin Hcl     Extreme stomach cramps    Onion Other (See Comments)    Ingesting onions causes tongue burning, throat swelling, abdominal pain    Latex Rash   Macrobid  [Nitrofurantoin ]     Severe diarrhea, severe abdominal pain/cramping    CURRENT HOME  MEDICATIONS: No current facility-administered medications for this encounter.    acarbose  (PRECOSE ) 25 MG tablet   acetaminophen  (TYLENOL ) 650 MG CR tablet   albuterol  (VENTOLIN  HFA) 108 (90 Base) MCG/ACT inhaler   cetirizine (ZYRTEC) 10 MG tablet   clopidogrel  (PLAVIX ) 75 MG tablet   Continuous Glucose Sensor (FREESTYLE LIBRE 3 PLUS SENSOR) MISC   fluticasone  (FLONASE ) 50  MCG/ACT nasal spray   fluticasone -salmeterol (ADVAIR DISKUS) 250-50 MCG/ACT AEPB   glucose blood test strip   Homeopathic Products (LEG CRAMP RELIEF PO)   hydrochlorothiazide  (HYDRODIURIL ) 25 MG tablet   hyoscyamine  (LEVBID ) 0.375 MG 12 hr tablet   JANUVIA  100 MG tablet   Lancets (ONETOUCH ULTRASOFT) lancets   losartan  (COZAAR ) 100 MG tablet   montelukast  (SINGULAIR ) 10 MG tablet   pantoprazole  (PROTONIX ) 40 MG tablet   rosuvastatin  (CRESTOR ) 20 MG tablet   HISTORY: Past Medical History:  Diagnosis Date   Allergy    Anginal pain    a. 07/2020 MV: No ischemia/infarct-->low risk.   Arthritis of both hands    Asthma    Cataract    Family history of adverse reaction to anesthesia    a.) apnea and PONV in first degree female relative (sister)   Foot osteomyelitis, right (HCC)    GERD (gastroesophageal reflux disease)    History of echocardiogram    a. 07/2020 Echo: EF 60-65%, no rwma, nl RV fxn, mild Ao sclerosis w/o stenosis.   Hypercholesteremia    Hypertension    Long term current use of clopidogrel     Neuropathy    Osteomyelitis of great toe of right foot (HCC)    PAD (peripheral artery disease)    a. 08/2021 LE Angio/PTA: Heavily calcified short occlusion of the R CIA (s/p balloon expandable covered stent), mild nonobstructive infrainguinal disease.  Mild left SFA disease.  Borderline significant L popliteal disease with three-vessel runoff below the knee.   Sleep apnea    a.) no nocturnal PAP therapy   T2DM (type 2 diabetes mellitus) (HCC)    Ulcer of right foot with fat layer exposed [facility])    Past Surgical History:  Procedure Laterality Date   ABDOMINAL AORTOGRAM W/LOWER EXTREMITY N/A 08/30/2021   Procedure: ABDOMINAL AORTOGRAM W/LOWER EXTREMITY;  Surgeon: Darron Deatrice LABOR, MD;  Location: [facility] INVASIVE CV LAB;  Service: Cardiovascular;  Laterality: N/A;   ABDOMINAL HYSTERECTOMY  08/16/2020   Dr. Arloa Dancer OBGYN   BACK SURGERY  1986   COLONOSCOPY WITH PROPOFOL  N/A  07/17/2017   Procedure: COLONOSCOPY WITH PROPOFOL ;  Surgeon: Viktoria Lamar DASEN, MD;  Location: [facility] ENDOSCOPY;  Service: Endoscopy;  Laterality: N/A;   NASAL SINUS SURGERY     PERIPHERAL VASCULAR INTERVENTION Right 08/30/2021   Procedure: PERIPHERAL VASCULAR INTERVENTION;  Surgeon: Darron Deatrice LABOR, MD;  Location: [facility] INVASIVE CV LAB;  Service: Cardiovascular;  Laterality: Right;  common Iliac   TOTAL LAPAROSCOPIC HYSTERECTOMY WITH BILATERAL SALPINGO OOPHORECTOMY Bilateral 08/02/2020   Procedure: TOTAL LAPAROSCOPIC HYSTERECTOMY WITH BILATERAL SALPINGO OOPHORECTOMY;  Surgeon: Arloa Lamar SQUIBB, MD;  Location: [facility] ORS;  Service: Gynecology;  Laterality: Bilateral;  RN TO ASSIST   UTERINE FIBROID SURGERY     Family History  Problem Relation Age of Onset   Diabetes Mother    Breast cancer Mother 37   Arthritis Mother    Cancer Mother    Heart disease Father    Alcohol abuse Father    Breast cancer Sister 25       dx twice    Cancer Sister  Heart disease Paternal Grandfather    Hypertension Brother    Colon cancer Neg Hx    Ovarian cancer Neg Hx    Social History   Tobacco Use   Smoking status: Former    Current packs/day: 0.00    Types: Cigarettes    Quit date: 07/01/2013    Years since quitting: 10.7   Smokeless tobacco: Never  Substance Use Topics   Alcohol use: No   LABS:  Hospital Outpatient Visit on 03/26/2024  Component Date Value Ref Range Status   WBC 03/26/2024 5.6  4.0 - 10.5 K/uL Final   RBC 03/26/2024 4.58  3.87 - 5.11 MIL/uL Final   Hemoglobin 03/26/2024 12.6  12.0 - 15.0 g/dL Final   HCT 90/74/7974 40.0  36.0 - 46.0 % Final   MCV 03/26/2024 87.3  80.0 - 100.0 fL Final   MCH 03/26/2024 27.5  26.0 - 34.0 pg Final   MCHC 03/26/2024 31.5  30.0 - 36.0 g/dL Final   RDW 90/74/7974 13.2  11.5 - 15.5 % Final   Platelets 03/26/2024 243  150 - 400 K/uL Final   nRBC 03/26/2024 0.0  0.0 - 0.2 % Final   Performed at [facility], [address]  [place], [place] [address]   Sodium 03/26/2024 139  135 - 145 mmol/L Final   Potassium 03/26/2024 3.4 (L)  3.5 - 5.1 mmol/L Final   Chloride 03/26/2024 102  98 - 111 mmol/L Final   CO2 03/26/2024 26  22 - 32 mmol/L Final   Glucose, Bld 03/26/2024 116 (H)  70 - 99 mg/dL Final   Glucose reference range applies only to samples taken after fasting for at least 8 hours.   BUN 03/26/2024 15  8 - 23 mg/dL Final   Creatinine, Ser 03/26/2024 0.71  0.44 - 1.00 mg/dL Final   Calcium  03/26/2024 9.1  8.9 - 10.3 mg/dL Final   GFR, Estimated 03/26/2024 >60  >60 mL/min Final   Comment: (NOTE) Calculated using the CKD-EPI Creatinine Equation (2021)    Anion gap 03/26/2024 11  5 - 15 Final   Performed at [facility], [address], [place], [place] [address]  Office Visit on 03/18/2024  Component Date Value Ref Range Status   MICRO NUMBER: 03/18/2024 [id number]   Final   SPECIMEN QUALITY: 03/18/2024 Adequate   Final   SOURCE: 03/18/2024 RIGHT FOOT   Final   STATUS: 03/18/2024 FINAL   Final   GRAM STAIN: 03/18/2024 No white blood cells seen Few epithelial cells No organisms seen   Final   RESULT: 03/18/2024 No Growth   Final  Office Visit on 03/04/2024  Component Date Value Ref Range Status   Hemoglobin A1C 03/04/2024 6.5 (A)  4.0 - 5.6 % Final    ECG: Date: 10/23/2023  Time ECG obtained: 1342 PM Rate: 77 bpm Rhythm: normal sinus Axis (leads I and aVF): left Intervals: PR 190 ms. QRS 106 ms. QTc 461 ms. ST segment and T wave changes: No evidence of acute T wave abnormalities or significant ST segment elevation or depression.  Evidence of a possible, age undetermined, prior infarct:  Yes; anterolateral Comparison: Similar to previous tracing obtained on 09/21/2022   IMAGING / PROCEDURES: MR FOOT RIGHT WO CONTRAST performed on 02/24/2024 Soft tissue wound of the great toe. Severe bone marrow edema throughout the first distal phalanx with cortical irregularity concerning for  osteomyelitis. Moderate osteoarthritis of the first MTP joint. Moderate arthritic changes of the medialis sesamoid-metatarsal articulation with subchondral marrow  edema in the medial hallux sesamoid.  VAS US  ABI WITH/WO TBI performed on 02/10/2024 Resting right ankle-brachial index indicates mild right lower extremity arterial disease. The right toe-brachial index is normal.  Resting left ankle-brachial index indicates mild left lower extremity arterial disease. The left toe-brachial index is normal.    TRANSTHORACIC ECHOCARDIOGRAM performed on 07/28/2020 Left ventricular ejection fraction, by estimation, is 60 to 65%. The left ventricle has normal function. The left ventricle has no regional wall motion abnormalities. Left ventricular diastolic parameters are indeterminate.  Right ventricular systolic function is normal. The right ventricular size is normal. There is normal pulmonary artery systolic pressure.  The mitral valve is normal in structure. No evidence of mitral valve regurgitation.  The aortic valve is tricuspid. Aortic valve regurgitation is not visualized. Mild aortic valve sclerosis is present, with no evidence of aortic valve stenosis.  The inferior vena cava is dilated in size with <50% respiratory variability, suggesting right atrial pressure of 15 mmHg.   MYOCARDIAL PERFUSION IMAGING STUDY (LEXISCAN ) performed on 07/27/2020 There was no ST segment deviation noted during stress. T wave inversion was noted during stress. TWI in III/aVF, V1-V6 @ baseline. Inferior and anterolateral T flattening throughout study following lexiscan  admin. The left ventricular ejection fraction visually is normal. measured EF appears under calculated at 40. Correlation with echo advised There is no evidence for ischemia The study is normal and low risk.  IMPRESSION AND PLAN: Karen Stephenson has been referred for pre-anesthesia review and clearance prior to her undergoing the planned anesthetic and  procedural courses. Available labs, pertinent testing, and imaging results were personally reviewed by me in preparation for upcoming operative/procedural course. [facility] medical record has been updated following extensive record review and patient interview with PAT staff.   This patient has been appropriately cleared by cardiology with an overall ACCEPTABLE risk of patient experiencing significant perioperative cardiovascular complications. Based on clinical review performed today (03/25/24), barring any significant acute changes in the patient's overall condition, it is anticipated that she will be able to proceed with the planned surgical intervention. Any acute changes in clinical condition may necessitate her procedure being postponed and/or cancelled. Patient will meet with anesthesia team (MD and/or CRNA) on the day of her procedure for preoperative evaluation/assessment. Questions regarding anesthetic course will be fielded at that time.   Pre-surgical instructions were reviewed with the patient during his PAT appointment, and questions were fielded to satisfaction by PAT clinical staff. She has been instructed on which medications that she will need to hold prior to surgery, as well as the ones that have been deemed safe/appropriate to take on the day of her procedure. As part of the general education provided by PAT, patient made aware both verbally and in writing, that she would need to abstain from the use of any illegal substances during her perioperative course. She was advised that failure to follow the provided instructions could necessitate case cancellation or result in serious perioperative complications up to and including death. Patient encouraged to contact PAT and/or her surgeon's office to discuss any questions or concerns that may arise prior to surgery; verbalized understanding.   Dorise Pereyra, MSN, APRN, FNP-C, CEN [facility]  Perioperative Services Nurse  Practitioner Phone: [phone] Fax: [phone] 03/26/24 1535 PM  NOTE: This note has been prepared using Dragon dictation software. Despite my best ability to proofread, there is always the potential that unintentional transcriptional errors may still occur from this process.

## 2024-03-26 ENCOUNTER — Telehealth (HOSPITAL_BASED_OUTPATIENT_CLINIC_OR_DEPARTMENT_OTHER): Payer: Self-pay | Admitting: *Deleted

## 2024-03-26 ENCOUNTER — Encounter
Admission: RE | Admit: 2024-03-26 | Discharge: 2024-03-26 | Disposition: A | Discharge status: Home / Self Care | Source: Ambulatory Visit | Attending: Podiatry | Admitting: Podiatry

## 2024-03-26 ENCOUNTER — Ambulatory Visit: Attending: Emergency Medicine | Admitting: Emergency Medicine

## 2024-03-26 DIAGNOSIS — I10 Essential (primary) hypertension: Secondary | ICD-10-CM | POA: Diagnosis not present

## 2024-03-26 DIAGNOSIS — Z01810 Encounter for preprocedural cardiovascular examination: Secondary | ICD-10-CM

## 2024-03-26 DIAGNOSIS — E119 Type 2 diabetes mellitus without complications: Secondary | ICD-10-CM | POA: Diagnosis present

## 2024-03-26 LAB — CBC
HCT: 40 % (ref 36.0–46.0)
Hemoglobin: 12.6 g/dL (ref 12.0–15.0)
MCH: 27.5 pg (ref 26.0–34.0)
MCHC: 31.5 g/dL (ref 30.0–36.0)
MCV: 87.3 fL (ref 80.0–100.0)
Platelets: 243 K/uL (ref 150–400)
RBC: 4.58 MIL/uL (ref 3.87–5.11)
RDW: 13.2 % (ref 11.5–15.5)
WBC: 5.6 K/uL (ref 4.0–10.5)
nRBC: 0 % (ref 0.0–0.2)

## 2024-03-26 LAB — BASIC METABOLIC PANEL WITH GFR
Anion gap: 11 (ref 5–15)
BUN: 15 mg/dL (ref 8–23)
CO2: 26 mmol/L (ref 22–32)
Calcium: 9.1 mg/dL (ref 8.9–10.3)
Chloride: 102 mmol/L (ref 98–111)
Creatinine, Ser: 0.71 mg/dL (ref 0.44–1.00)
GFR, Estimated: >60 mL/min (ref >60)
Glucose, Bld: 116 mg/dL — ABNORMAL HIGH (ref 70–99)
Potassium: 3.4 mmol/L — ABNORMAL LOW (ref 3.5–5.1)
Sodium: 139 mmol/L (ref 135–145)

## 2024-03-26 NOTE — Telephone Encounter (Signed)
 Hold Plavix  5 days before the surgery but should start aspirin  81 mg once daily until Plavix  is resumed.

## 2024-03-26 NOTE — Telephone Encounter (Signed)
 Pt agreeable to tele preop appt today due to procedure date is 03/30/24 and med hold x 5 days. I did review the recommendations from Dr. Darron pt to start ASA 81 mg daily while she is off Plavix . She will stop ASA once she resume Plavix  after her procedure. Pt did ask a question though, should she take the ASA the day of the procedure. I assured the pt I will the preop APP address that question.   Pt states her last dose of Plavix  was 03/25/24.   Med rec and consent are done.     Patient Consent for Virtual Visit        Karen Stephenson has provided verbal consent on 03/26/2024 for a virtual visit (video or telephone).   CONSENT FOR VIRTUAL VISIT FOR:  Karen Stephenson  By participating in this virtual visit I agree to the following:  I hereby voluntarily request, consent and authorize [facility] and its employed or contracted physicians, physician assistants, nurse practitioners or other licensed health care professionals (the Practitioner), to provide me with telemedicine health care services (the "Services) as deemed necessary by the treating Practitioner. I acknowledge and consent to receive the Services by the Practitioner via telemedicine. I understand that the telemedicine visit will involve communicating with the Practitioner through live audiovisual communication technology and the disclosure of certain medical information by electronic transmission. I acknowledge that I have been given the opportunity to request an in-person assessment or other available alternative prior to the telemedicine visit and am voluntarily participating in the telemedicine visit.  I understand that I have the right to withhold or withdraw my consent to the use of telemedicine in the course of my care at any time, without affecting my right to future care or treatment, and that the Practitioner or I may terminate the telemedicine visit at any time. I understand that I have the right to inspect all  information obtained and/or recorded in the course of the telemedicine visit and may receive copies of available information for a reasonable fee.  I understand that some of the potential risks of receiving the Services via telemedicine include:  Delay or interruption in medical evaluation due to technological equipment failure or disruption; Information transmitted may not be sufficient (e.g. poor resolution of images) to allow for appropriate medical decision making by the Practitioner; and/or  In rare instances, security protocols could fail, causing a breach of personal health information.  Furthermore, I acknowledge that it is my responsibility to provide information about my medical history, conditions and care that is complete and accurate to the best of my ability. I acknowledge that Practitioner's advice, recommendations, and/or decision may be based on factors not within their control, such as incomplete or inaccurate data provided by me or distortions of diagnostic images or specimens that may result from electronic transmissions. I understand that the practice of medicine is not an exact science and that Practitioner makes no warranties or guarantees regarding treatment outcomes. I acknowledge that a copy of this consent can be made available to me via my patient portal [facility] MyChart), or I can request a printed copy by calling the office of [facility].    I understand that my insurance will be billed for this visit.   I have read or had this consent read to me. I understand the contents of this consent, which adequately explains the benefits and risks of the Services being provided via telemedicine.  I have been provided  ample opportunity to ask questions regarding this consent and the Services and have had my questions answered to my satisfaction. I give my informed consent for the services to be provided through the use of telemedicine in my medical care

## 2024-03-26 NOTE — Telephone Encounter (Signed)
 Pt agreeable to tele preop appt today due to procedure date is 03/30/24 and med hold x 5 days. I did review the recommendations from Dr. Darron pt to start ASA 81 mg daily while she is off Plavix . She will stop ASA once she resume Plavix  after her procedure. Pt did ask a question though, should she take the ASA the day of the procedure. I assured the pt I will the preop APP address that question.   Pt states her last dose of Plavix  was 03/25/24.   Med rec and consent are done.

## 2024-03-26 NOTE — Telephone Encounter (Signed)
   Name: Karen Stephenson  DOB: 27-Nov-1955  MRN: [id number]  Primary Cardiologist: Redell Cave, MD   Preoperative team, please contact this patient and set up a phone call appointment for further preoperative risk assessment. Please obtain consent and complete medication review. Thank you for your help.  I confirm that guidance regarding antiplatelet and oral anticoagulation therapy has been completed and, if necessary, noted below.  Per Dr. Darron- Hold Plavix  5 days before the surgery but should start aspirin  81 mg once daily until Plavix  is resumed.   I also confirmed the patient resides in the state of [place] . As per [facility] Medical Board telemedicine laws, the patient must reside in the state in which the provider is licensed.   [name], NP 03/26/2024, 11:51 AM [facility]

## 2024-03-26 NOTE — Progress Notes (Signed)
 Virtual Visit via Telephone Note   Because of Karen Stephenson co-morbid illnesses, she is at least at moderate risk for complications without adequate follow up.  This format is felt to be most appropriate for this patient at this time.  Due to technical limitations with video connection (technology), today's appointment will be conducted as an audio only telehealth visit, and Karen Stephenson verbally agreed to proceed in this manner.   All issues noted in this document were discussed and addressed.  No physical exam could be performed with this format.  Evaluation Performed:  Preoperative cardiovascular risk assessment _____________   Date:  03/26/2024   Patient ID:  Karen Stephenson, DOB [birth year], MRN [id number] Patient Location:  Home Provider location:   Office  Primary Care Provider:  Sharma Coyer, MD Primary Cardiologist:  Redell Cave, MD  Chief Complaint / Patient Profile   68 y.o. y/o female with a h/o hypertension, hyperlipidemia, diabetes, sleep apnea, chest pain, peripheral arterial disease who is pending application, allograft, skin, debridement, wound on 03/30/2024 with [facility] and presents today for telephonic preoperative cardiovascular risk assessment.  History of Present Illness    Karen Stephenson is a 68 y.o. female who presents via audio/video conferencing for a telehealth visit today.  Pt was last seen in cardiology clinic on 10/23/2023 by Lonni Freund, NP.  At that time Karen Stephenson was doing well without any significant claudication.  The patient is now pending procedure as outlined above. Since her last visit, she denies chest pain, shortness of breath, lower extremity edema, fatigue, palpitations, melena, hematuria, hemoptysis, diaphoresis, weakness, presyncope, syncope, orthopnea, and PND.  Today patient is doing well.  She is without acute cardiovascular concerns today.  She is without any claudication.  Does suffer from peripheral neuropathy.   She does continue to work which keeps her busy and active.  She walks frequently without exertional symptoms.  She is without any chest pains or dyspnea.  Overall no symptoms to suggest active angina.  She is able to complete greater than 4 METS.  Past Medical History    Past Medical History:  Diagnosis Date   Allergy    Anginal pain    a. 07/2020 MV: No ischemia/infarct-->low risk.   Arthritis of both hands    Asthma    Cataract    Family history of adverse reaction to anesthesia    a.) apnea and PONV in first degree female relative (sister)   GERD (gastroesophageal reflux disease)    History of echocardiogram    a. 07/2020 Echo: EF 60-65%, no rwma, nl RV fxn, mild Ao sclerosis w/o stenosis.   Hypercholesteremia    Hypertension    Long term current use of clopidogrel     Neuropathy    Osteomyelitis of great toe of right foot (HCC)    PAD (peripheral artery disease)    a. 08/2021 LE Angio/PTA: Heavily calcified short occlusion of the R CIA (s/p balloon expandable covered stent), mild nonobstructive infrainguinal disease.  Mild left SFA disease.  Borderline significant L popliteal disease with three-vessel runoff below the knee.   Sleep apnea    a.) no nocturnal PAP therapy   T2DM (type 2 diabetes mellitus) (HCC)    Ulcer of right foot with fat layer exposed [facility])    Past Surgical History:  Procedure Laterality Date   ABDOMINAL AORTOGRAM W/LOWER EXTREMITY N/A 08/30/2021   Procedure: ABDOMINAL AORTOGRAM W/LOWER EXTREMITY;  Surgeon: Darron Deatrice LABOR, MD;  [facility]: [facility] INV[facility] LAB;  Service: Cardiovascular;  Laterality: N/A;   ABDOMINAL HYSTERECTOMY  08/16/2020   Dr. Arloa Dancer OBGYN   BACK SURGERY  1986   COLONOSCOPY WITH PROPOFOL  N/A 07/17/2017   Procedure: COLONOSCOPY WITH PROPOFOL ;  Surgeon: Viktoria Lamar DASEN, MD;  Location: [facility] ENDOSCOPY;  Service: Endoscopy;  Laterality: N/A;   NASAL SINUS SURGERY     PERIPHERAL VASCULAR INTERVENTION Right 08/30/2021   Procedure:  PERIPHERAL VASCULAR INTERVENTION;  Surgeon: Darron Deatrice LABOR, MD;  Location: [facility] INVASIVE CV LAB;  Service: Cardiovascular;  Laterality: Right;  common Iliac   TOTAL LAPAROSCOPIC HYSTERECTOMY WITH BILATERAL SALPINGO OOPHORECTOMY Bilateral 08/02/2020   Procedure: TOTAL LAPAROSCOPIC HYSTERECTOMY WITH BILATERAL SALPINGO OOPHORECTOMY;  Surgeon: Arloa Lamar SQUIBB, MD;  Location: [facility] ORS;  Service: Gynecology;  Laterality: Bilateral;  RN TO ASSIST   UTERINE FIBROID SURGERY      Allergies  Allergies  Allergen Reactions   Amoxicillin -Pot Clavulanate Diarrhea   Aspirin      Higher doses - passed out    Ducodyl [Bisacodyl]     Passed out, extreme cramping    Erythromycin     Stomach Ache Stomach pain.   Glipizide     Extreme stomach aches    Metformin Hcl     Extreme stomach cramps    Onion Other (See Comments)    Ingesting onions causes tongue burning, throat swelling, abdominal pain    Latex Rash   Macrobid  [Nitrofurantoin ]     Severe diarrhea, severe abdominal pain/cramping    Home Medications    Prior to Admission medications   Medication Sig Start Date End Date Taking? Authorizing Provider  acarbose  (PRECOSE ) 25 MG tablet TAKE 1 TABLET BY MOUTH THREE TIMES DAILY WITH MEALS 01/21/24   Simmons-Robinson, Rockie, MD  acetaminophen  (TYLENOL ) 650 MG CR tablet Take 1,300 mg by mouth every 8 (eight) hours as needed for pain.    [provider]  albuterol  (VENTOLIN  HFA) 108 (90 Base) MCG/ACT inhaler Inhale 2 puffs into the lungs every 6 (six) hours as needed for wheezing or shortness of breath. 10/26/21   Drubel, Manuelita, PA-C  cetirizine (ZYRTEC) 10 MG tablet Take 10 mg by mouth daily. 04/24/10   [provider]  clopidogrel  (PLAVIX ) 75 MG tablet Take 1 tablet by mouth once daily 01/08/24   Vivienne Lonni Ingle, NP  Continuous Glucose Sensor (FREESTYLE LIBRE 3 PLUS SENSOR) MISC Change sensor every 15 days. 03/04/24   Simmons-Robinson, Rockie, MD  fluticasone  (FLONASE ) 50  MCG/ACT nasal spray Place 2 sprays into both nostrils daily. 03/04/24   Simmons-Robinson, Makiera, MD  fluticasone -salmeterol (ADVAIR DISKUS) 250-50 MCG/ACT AEPB Inhale 1 puff into the lungs in the morning and at bedtime. 11/08/23   Simmons-Robinson, Makiera, MD  glucose blood test strip Check sugar once daily, E 11.9 05/02/16   Bertrum Charlie CROME, MD  Homeopathic Products (LEG CRAMP RELIEF PO) Take 2 capsules by mouth daily.     [provider]  hydrochlorothiazide  (HYDRODIURIL ) 25 MG tablet Take 1 tablet by mouth once daily 01/27/24   Simmons-Robinson, Makiera, MD  hyoscyamine  (LEVBID ) 0.375 MG 12 hr tablet Take 1 tablet (0.375 mg total) by mouth as needed. 12/04/23   Simmons-Robinson, Rockie, MD  JANUVIA  100 MG tablet Take 1 tablet by mouth once daily 02/10/24   Simmons-Robinson, Rockie, MD  Lancets [facility] ULTRASOFT) lancets Check sugar once daily ,DX E11.9 06/01/16   Bertrum Charlie CROME, MD  losartan  (COZAAR ) 100 MG tablet Take 1 tablet by mouth once daily 07/30/23   Simmons-Robinson, Rockie, MD  montelukast  (SINGULAIR )  10 MG tablet Take 1 tablet (10 mg total) by mouth at bedtime. 10/30/23   Simmons-Robinson, Rockie, MD  pantoprazole  (PROTONIX ) 40 MG tablet Take 1 tablet (40 mg total) by mouth daily. 03/04/24   Simmons-Robinson, Makiera, MD  rosuvastatin  (CRESTOR ) 20 MG tablet Take 1 tablet (20 mg total) by mouth daily. 03/04/24   Simmons-Robinson, Rockie, MD    Physical Exam    Vital Signs:  Kyilee B Breed does not have vital signs available for review today.  Given telephonic nature of communication, physical exam is limited. AAOx3. NAD. Normal affect.  Speech and respirations are unlabored.  Accessory Clinical Findings    None  Assessment & Plan    1.  Preoperative Cardiovascular Risk Assessment: According to the Revised Cardiac Risk Index (RCRI), her Perioperative Risk of Major Cardiac Event is (%): 0.4. Her Functional Capacity in METs is: 5.07 according to the [facility] Activity  Status Index (DASI).  Therefore, based on ACC/AHA guidelines, patient would be at acceptable risk for the planned procedure without further cardiovascular testing.  The patient was advised that if she develops new symptoms prior to surgery to contact our office to arrange for a follow-up visit, and she verbalized understanding.  Per Dr. Darron- Hold Plavix  5 days before the surgery but should start aspirin  81 mg once daily until Plavix  is resumed.    A copy of this note will be routed to requesting surgeon.  Time:   Today, I have spent 10 minutes with the patient with telehealth technology discussing medical history, symptoms, and management plan.     Lum LITTIE Louis, NP  03/26/2024, 2:50 PM

## 2024-03-29 MED ORDER — SODIUM CHLORIDE 0.9 % IV SOLN: INTRAVENOUS | Status: DC

## 2024-03-29 MED ORDER — ORAL CARE MOUTH RINSE
15.0000 mL | Freq: Once | OROMUCOSAL | Status: AC
Start: 2024-03-29 — End: 2024-03-30

## 2024-03-29 MED ORDER — CHLORHEXIDINE GLUCONATE 0.12 % MT SOLN
15.0000 mL | Freq: Once | OROMUCOSAL | Status: AC
Start: 2024-03-29 — End: 2024-03-30
  Administered 2024-03-30: 15 mL via OROMUCOSAL

## 2024-03-30 ENCOUNTER — Other Ambulatory Visit: Payer: Self-pay

## 2024-03-30 ENCOUNTER — Ambulatory Visit: Payer: Self-pay | Admitting: Urgent Care

## 2024-03-30 ENCOUNTER — Encounter
Admission: RE | Disposition: A | Discharge status: Home / Self Care | Payer: Self-pay | Source: Home / Self Care | Attending: Podiatry

## 2024-03-30 ENCOUNTER — Ambulatory Visit
Admission: RE | Admit: 2024-03-30 | Discharge: 2024-03-30 | Disposition: A | Discharge status: Home / Self Care | Attending: Podiatry | Admitting: Podiatry

## 2024-03-30 ENCOUNTER — Encounter: Payer: Self-pay | Admitting: Podiatry

## 2024-03-30 DIAGNOSIS — E785 Hyperlipidemia, unspecified: Secondary | ICD-10-CM | POA: Insufficient documentation

## 2024-03-30 DIAGNOSIS — I10 Essential (primary) hypertension: Secondary | ICD-10-CM | POA: Diagnosis not present

## 2024-03-30 DIAGNOSIS — M869 Osteomyelitis, unspecified: Secondary | ICD-10-CM | POA: Diagnosis not present

## 2024-03-30 DIAGNOSIS — J45909 Unspecified asthma, uncomplicated: Secondary | ICD-10-CM | POA: Insufficient documentation

## 2024-03-30 DIAGNOSIS — Z87891 Personal history of nicotine dependence: Secondary | ICD-10-CM | POA: Insufficient documentation

## 2024-03-30 DIAGNOSIS — Z7984 Long term (current) use of oral hypoglycemic drugs: Secondary | ICD-10-CM | POA: Diagnosis not present

## 2024-03-30 DIAGNOSIS — Z79899 Other long term (current) drug therapy: Secondary | ICD-10-CM | POA: Insufficient documentation

## 2024-03-30 DIAGNOSIS — G4733 Obstructive sleep apnea (adult) (pediatric): Secondary | ICD-10-CM | POA: Insufficient documentation

## 2024-03-30 DIAGNOSIS — L97512 Non-pressure chronic ulcer of other part of right foot with fat layer exposed: Secondary | ICD-10-CM | POA: Insufficient documentation

## 2024-03-30 DIAGNOSIS — M86671 Other chronic osteomyelitis, right ankle and foot: Secondary | ICD-10-CM | POA: Diagnosis not present

## 2024-03-30 DIAGNOSIS — E08621 Diabetes mellitus due to underlying condition with foot ulcer: Secondary | ICD-10-CM | POA: Diagnosis not present

## 2024-03-30 DIAGNOSIS — E1169 Type 2 diabetes mellitus with other specified complication: Secondary | ICD-10-CM | POA: Insufficient documentation

## 2024-03-30 DIAGNOSIS — E11621 Type 2 diabetes mellitus with foot ulcer: Secondary | ICD-10-CM | POA: Diagnosis present

## 2024-03-30 DIAGNOSIS — E119 Type 2 diabetes mellitus without complications: Secondary | ICD-10-CM

## 2024-03-30 DIAGNOSIS — E1151 Type 2 diabetes mellitus with diabetic peripheral angiopathy without gangrene: Secondary | ICD-10-CM | POA: Diagnosis not present

## 2024-03-30 HISTORY — DX: Primary osteoarthritis, right hand: M19.042

## 2024-03-30 HISTORY — DX: Polyneuropathy, unspecified: G62.9

## 2024-03-30 HISTORY — DX: Primary osteoarthritis, right hand: M19.041

## 2024-03-30 HISTORY — DX: Long term (current) use of antithrombotics/antiplatelets: Z79.02

## 2024-03-30 HISTORY — DX: Type 2 diabetes mellitus without complications: E11.9

## 2024-03-30 LAB — GLUCOSE, CAPILLARY
Glucose-Capillary: 120 mg/dL — ABNORMAL HIGH (ref 70–99)
Glucose-Capillary: 122 mg/dL — ABNORMAL HIGH (ref 70–99)

## 2024-03-30 SURGERY — APPLICATION, ALLOGRAFT, SKIN
Anesthesia: General | Laterality: Right

## 2024-03-30 MED ORDER — OXYCODONE HCL 5 MG PO TABS: 5.0000 mg | ORAL_TABLET | Freq: Once | ORAL | Status: DC | PRN

## 2024-03-30 MED ORDER — OXYCODONE HCL 5 MG/5ML PO SOLN: 5.0000 mg | Freq: Once | ORAL | Status: DC | PRN

## 2024-03-30 MED ORDER — 0.9 % SODIUM CHLORIDE (POUR BTL) OPTIME
TOPICAL | Status: DC | PRN
  Administered 2024-03-30: 500 mL

## 2024-03-30 MED ORDER — FENTANYL CITRATE (PF) 100 MCG/2ML IJ SOLN
INTRAMUSCULAR | Status: AC
  Filled 2024-03-30: qty 2

## 2024-03-30 MED ORDER — BUPIVACAINE HCL 0.5 % IJ SOLN
INTRAMUSCULAR | Status: DC | PRN
  Administered 2024-03-30: 10 mL

## 2024-03-30 MED ORDER — FENTANYL CITRATE (PF) 100 MCG/2ML IJ SOLN: 25.0000 ug | INTRAMUSCULAR | Status: DC | PRN

## 2024-03-30 MED ORDER — ONDANSETRON HCL 4 MG/2ML IJ SOLN
INTRAMUSCULAR | Status: DC | PRN
  Administered 2024-03-30: 4 mg via INTRAVENOUS

## 2024-03-30 MED ORDER — ACETAMINOPHEN 10 MG/ML IV SOLN
INTRAVENOUS | Status: DC | PRN
  Administered 2024-03-30: 1000 mg via INTRAVENOUS

## 2024-03-30 MED ORDER — PROPOFOL 1000 MG/100ML IV EMUL
INTRAVENOUS | Status: AC
Start: 2024-03-30 — End: 2024-03-30
  Filled 2024-03-30: qty 100

## 2024-03-30 MED ORDER — BUPIVACAINE HCL (PF) 0.5 % IJ SOLN
INTRAMUSCULAR | Status: AC
  Filled 2024-03-30: qty 30

## 2024-03-30 MED ORDER — FENTANYL CITRATE (PF) 100 MCG/2ML IJ SOLN
INTRAMUSCULAR | Status: DC | PRN
  Administered 2024-03-30: 50 ug via INTRAVENOUS

## 2024-03-30 MED ORDER — PROPOFOL 10 MG/ML IV BOLUS
INTRAVENOUS | Status: DC | PRN
  Administered 2024-03-30: 60 mg via INTRAVENOUS
  Administered 2024-03-30: 100 ug/kg/min via INTRAVENOUS

## 2024-03-30 MED ORDER — LIDOCAINE HCL (PF) 2 % IJ SOLN
INTRAMUSCULAR | Status: AC
Start: 2024-03-30 — End: 2024-03-30
  Filled 2024-03-30: qty 5

## 2024-03-30 MED ORDER — ACETAMINOPHEN 10 MG/ML IV SOLN
INTRAVENOUS | Status: AC
  Filled 2024-03-30: qty 100

## 2024-03-30 MED ORDER — DEXAMETHASONE SODIUM PHOSPHATE 10 MG/ML IJ SOLN
INTRAMUSCULAR | Status: AC
  Filled 2024-03-30: qty 1

## 2024-03-30 MED ORDER — ONDANSETRON HCL 4 MG/2ML IJ SOLN
INTRAMUSCULAR | Status: AC
  Filled 2024-03-30: qty 2

## 2024-03-30 MED ORDER — CHLORHEXIDINE GLUCONATE 0.12 % MT SOLN
OROMUCOSAL | Status: AC
  Filled 2024-03-30: qty 15

## 2024-03-30 MED ORDER — LIDOCAINE HCL (PF) 2 % IJ SOLN
INTRAMUSCULAR | Status: DC | PRN
  Administered 2024-03-30: 80 mg via INTRADERMAL

## 2024-03-30 MED ORDER — DOXYCYCLINE HYCLATE 100 MG PO TABS: 100.0000 mg | ORAL_TABLET | Freq: Two times a day (BID) | ORAL | 0 refills | Status: AC

## 2024-03-30 SURGICAL SUPPLY — 30 items
BLADE MED AGGRESSIVE (BLADE) IMPLANT
BLADE OSC/SAGITTAL MD 5.5X18 (BLADE) IMPLANT
BLADE SURG 15 STRL LF DISP TIS (BLADE) IMPLANT
BLADE SURG MINI STRL (BLADE) IMPLANT
BNDG ESMARCH 4X12 STRL LF (GAUZE/BANDAGES/DRESSINGS) ×2 IMPLANT
BNDG GAUZE DERMACEA FLUFF 4 (GAUZE/BANDAGES/DRESSINGS) ×2 IMPLANT
CNTNR URN SCR LID CUP LEK RST (MISCELLANEOUS) IMPLANT
CUFF TOURN SGL QUICK 18X4 (TOURNIQUET CUFF) IMPLANT
ELECTRODE REM PT RTRN 9FT ADLT (ELECTROSURGICAL) ×2 IMPLANT
GAUZE SPONGE 4X4 12PLY STRL (GAUZE/BANDAGES/DRESSINGS) ×4 IMPLANT
GLOVE PI ORTHO PRO STRL 7.5 (GLOVE) ×2 IMPLANT
GOWN STRL REUS W/ TWL LRG LVL3 (GOWN DISPOSABLE) ×4 IMPLANT
GRAFT SKIN WND MICRO 38 (Tissue) IMPLANT
KIT TURNOVER KIT A (KITS) ×2 IMPLANT
LABEL OR SOLS (LABEL) ×2 IMPLANT
MANIFOLD NEPTUNE II (INSTRUMENTS) ×2 IMPLANT
NDL BIOPSY JAMSHIDI 11X6 (NEEDLE) IMPLANT
NDL HYPO 25X1 1.5 SAFETY (NEEDLE) ×4 IMPLANT
NEEDLE BIOPSY JAMSHIDI 11X6 (NEEDLE) ×1 IMPLANT
NEEDLE HYPO 25X1 1.5 SAFETY (NEEDLE) ×2 IMPLANT
NS IRRIG 500ML POUR BTL (IV SOLUTION) ×2 IMPLANT
PACK EXTREMITY ARMC (MISCELLANEOUS) ×2 IMPLANT
PAD ABD DERMACEA PRESS 5X9 (GAUZE/BANDAGES/DRESSINGS) IMPLANT
PENCIL SMOKE EVACUATOR (MISCELLANEOUS) ×4 IMPLANT
SOLUTION PREP PVP 2OZ (MISCELLANEOUS) ×2 IMPLANT
SUTURE EHLN 3-0 FS-10 30 BLK (SUTURE) ×2 IMPLANT
SWAB CULTURE AMIES ANAERIB BLU (MISCELLANEOUS) IMPLANT
SYR 10ML LL (SYRINGE) ×2 IMPLANT
TRAP FLUID SMOKE EVACUATOR (MISCELLANEOUS) ×2 IMPLANT
WATER STERILE IRR 500ML POUR (IV SOLUTION) ×2 IMPLANT

## 2024-03-30 NOTE — Op Note (Signed)
 Patient Name: DELICIA BERENS DOB: 30-Mar-1956  MRN: [id number]   Date of Service: 03/30/2024  Surgeon: Dr. Juliene Medicine, DPM Assistants: None Pre-operative Diagnosis:  Diabetic ulcer right foot submetatarsal 1 and submetatarsal 2 Osteomyelitis right foot Post-operative Diagnosis:  Diabetic ulcer right foot submetatarsal 1 and submetatarsal 2 Osteomyelitis right foot Procedures: Preparation of wound bed for skin substitute total area 3.48 cm Bone biopsy right fifth metatarsal and right great toe distal phalanx Application of skin substitute right foot total area 3.48 cm Pathology/Specimens: ID Type Source Tests Collected by Time Destination  1 : Right 5th metatarsal Tissue PATH Bone biopsy SURGICAL PATHOLOGY Medicine Juliene SAUNDERS, DPM 03/30/2024 0755   2 : Right big toe Tissue PATH Bone biopsy SURGICAL PATHOLOGY Medicine Juliene SAUNDERS, DPM 03/30/2024 9241   A : right big toe culture Tissue Wound AEROBIC/ANAEROBIC CULTURE W GRAM STAIN (SURGICAL/DEEP WOUND) Medicine Juliene SAUNDERS, DPM 03/30/2024 0737   B : Right 5th metatarsal Tissue Wound AEROBIC/ANAEROBIC CULTURE W GRAM STAIN (SURGICAL/DEEP WOUND) Medicine Juliene SAUNDERS, DPM 03/30/2024 0759    Anesthesia: MAC with local Hemostasis: * No tourniquets in log * Estimated Blood Loss: 10 mL Materials:  Implant Name Type Inv. Item Serial No. Manufacturer Lot No. LRB No. Used Action  GRAFT SKIN WND MICRO 38 - [id number] Tissue GRAFT SKIN WND MICRO 38  [employer] [id number] Right 1 Implanted   Medications: 10 cc 0.5% Marcaine  plain Complications: No complications noted  Indications for Procedure:  This is a 68 y.o. female with a history of longstanding diabetic foot ulceration on the right great toe and submetatarsal 5 with ongoing treatment for over 1 year and have failed to heal with local wound care as well as PAD which is managed with previous percutaneous stenting.  MRI was concerning for osteomyelitis.  She presents today for biopsy of the affected sites as  well as application of skin substitute..   Procedure in Detail: Patient was identified in pre-operative holding area. Formal consent was signed and the right lower extremity was marked. Patient was brought back to the operating room. Anesthesia was induced. The extremity was prepped and draped in the usual sterile fashion. Timeout was taken to confirm patient name, laterality, and procedure prior to incision.   Attention was then directed to the right dorsal lateral foot where a small incision was made on the lateral fifth metatarsal head.  A Jamshidi needle was used to harvest samplings of the fifth metatarsal head and these were sent for culture and pathology.  This incision was irrigated and closed with nylon.  I then directed my attention to the dorsal right hallux distal phalanx base.  A small incision was made and soft tissues were elevated and a Jamshidi needle was used to harvest a core sample of the base of the distal phalanx.  This was sent for culture and pathology.  This incision was irrigated and closed with nylon.  Attention was then directed to the submetatarsal 5 ulcer on the right foot and plantar medial hallux ulcer where full-thickness sharp excisional debridement was completed on the ulceration at both sites.  Predebridement the submetatarsal 5 ulceration measured 0.5 x 0.4 x 0.2 cm.   The hallux ulcer predebridement measured 2.0 x 1.8 x 0.3 cm.  Full-thickness excisional debridement was carried out on both ulcerations sharply with a scalpel curette and rongeur to remove nonviable tissue, necrotic slough, biofilm and surrounding hyperkeratosis.  Postdebridement the submetatarsal 5 ulceration measured 0.8 x 0.6 x 0.4 cm and the hallux ulcer measured  2.5 x 2.0 x 0.5 cm.  Total area of debridement was 3.48 cm. Once debridement was completed hemostasis was achieved and the wound bed thoroughly irrigated.  Kerecis skin substitute micronized skin substitute was selected hydrated with saline and  inset into the wound bed and affixed with staples and Adaptic in order to fill the voids remaining and accelerate soft tissue healing, providing a scaffolding for soft tissue ingrowth and epithelialization.  Surgical lubricant was applied to the graft and Adaptic stapled over the graft.  Secondary dressings were applied with ABD pad 4 x 4 Kerlix and Ace wrap. Patient tolerated the procedure well.      Disposition: Following a period of post-operative monitoring, patient will be transferred to home.

## 2024-03-30 NOTE — Brief Op Note (Signed)
 03/30/2024  8:37 AM  PATIENT:  Karen Stephenson  68 y.o. female  PRE-OPERATIVE DIAGNOSIS:  L97.512 ULCER RIGHT FOOT WITH FAT LAYER EXPOSED  POST-OPERATIVE DIAGNOSIS:  L97.512 ULCER RIGHT FOOT WITH FAT LAYER EXPOSED  PROCEDURE:  Procedure(s) with comments: APPLICATION, ALLOGRAFT, SKIN (Right) - IV SEDATION DEBRIDEMENT, WOUND (Right)  SURGEON:  Surgeons and Role:    * Silva Juliene SAUNDERS, DPM - Primary  PHYSICIAN ASSISTANT:   ASSISTANTS: none   ANESTHESIA:   MAC  EBL:  10 mL   BLOOD ADMINISTERED:none  DRAINS: none   LOCAL MEDICATIONS USED:  MARCAINE     and Amount: 10 ml  SPECIMEN:  R 5th metatarsal head and hallux distal phalanx  DISPOSITION OF SPECIMEN:  path and micro  COUNTS:  YES  TOURNIQUET:  * No tourniquets in log *  DICTATION: .Note written in EPIC  PLAN OF CARE: Discharge to home after PACU  PATIENT DISPOSITION:  PACU - hemodynamically stable.   Delay start of Pharmacological VTE agent (>24hrs) due to surgical blood loss or risk of bleeding: no

## 2024-03-30 NOTE — Discharge Instructions (Addendum)
 Post-Surgery Instructions  1. If you are recuperating from surgery anywhere other than home, please be sure to leave us  a number where you can be reached. 2. Go directly home and rest. 3. The keep operated foot (or feet) elevated six inches above the hip when sitting or lying down. 4. Support the elevated foot and leg with pillows under the calf. DO NOT PLACE PILLOWS UNDER THE KNEE. 5. DO NOT get your bandages wet. This will increase your chances of getting an infection. 6. Wear your surgical shoe at all times when you are up. 7. A limited amount of pain and swelling may occur. The skin may take on a bruised appearance. This is no cause for alarm. 8. For slight pain and swelling, apply an ice pack directly over the bandage for 15 minutes every hour. Continue icing until seen in the office. DO NOT apply any form of heat to the area. 9. Have prescription(s) filled immediately and take as directed. 10. Drink lots of liquids, water, and juice. 11. CALL THE OFFICE IMMEDIATELY IF: a. Bleeding continues b. Pain increases and/or does not respond to medication c. Bandage or cast appears too tight d. Any liquids (water, coffee, etc.) have spilled on your bandages. e. Tripping, falling, or stubbing the surgical foot f. If your temperature rises above 101 g. If you have ANY questions at all 12. Please use the crutches, knee scooter, or walker you have prescribed, rented, or purchased. If you are non-weight bearing DO NOT put weight on the operated foot for _________ days. If you are weight-bearing, follow your physician's instructions. You are expected to be: ? weight-bearing  13. Special Instructions: Change the dressing daily starting Wednesday 04/01/14. Apply the surgical lubricant to the graft site (the meshed area with staples on it), then cover with saline moistened gauze and dry gauze. Change every day   14. Your next appointment is:  04/08/2024 10:30 AM    In the [facility]  If  you need to reach the nurse for any reason, please call: [place]: [phone] [place]: [phone] [place]: [phone]

## 2024-03-30 NOTE — Anesthesia Preprocedure Evaluation (Signed)
 Anesthesia Evaluation  Patient identified by MRN, date of birth, ID band Patient awake    Reviewed: Allergy & Precautions, NPO status , Patient's Chart, lab work & pertinent test results  History of Anesthesia Complications Negative for: history of anesthetic complications  Airway Mallampati: III  TM Distance: <3 FB Neck ROM: full    Dental  (+) Chipped   Pulmonary asthma , sleep apnea , former smoker   Pulmonary exam normal        Cardiovascular Exercise Tolerance: Good hypertension, + angina  + Peripheral Vascular Disease  Normal cardiovascular exam     Neuro/Psych  Neuromuscular disease  negative psych ROS   GI/Hepatic Neg liver ROS,GERD  Controlled,,  Endo/Other  diabetes, Type 2    Renal/GU negative Renal ROS  negative genitourinary   Musculoskeletal   Abdominal   Peds  Hematology negative hematology ROS (+)   Anesthesia Other Findings Past Medical History: No date: Allergy No date: Anginal pain     Comment:  a. 07/2020 MV: No ischemia/infarct-->low risk. No date: Arthritis of both hands No date: Asthma No date: Cataract No date: Family history of adverse reaction to anesthesia     Comment:  a.) apnea and PONV in first degree female relative               (sister) No date: GERD (gastroesophageal reflux disease) No date: History of echocardiogram     Comment:  a. 07/2020 Echo: EF 60-65%, no rwma, nl RV fxn, mild Ao               sclerosis w/o stenosis. No date: Hypercholesteremia No date: Hypertension No date: Long term current use of clopidogrel  No date: Neuropathy No date: Osteomyelitis of great toe of right foot (HCC) No date: PAD (peripheral artery disease)     Comment:  a. 08/2021 LE Angio/PTA: Heavily calcified short               occlusion of the R CIA (s/p balloon expandable covered               stent), mild nonobstructive infrainguinal disease.  Mild               left SFA disease.   Borderline significant L popliteal               disease with three-vessel runoff below the knee. No date: Sleep apnea     Comment:  a.) no nocturnal PAP therapy No date: T2DM (type 2 diabetes mellitus) (HCC) No date: Ulcer of right foot with fat layer exposed [facility])  Past Surgical History: 08/30/2021: ABDOMINAL AORTOGRAM W/LOWER EXTREMITY; N/A     Comment:  Procedure: ABDOMINAL AORTOGRAM W/LOWER EXTREMITY;                Surgeon: Darron Deatrice LABOR, MD;  [facility] INVAS[facility] CV              LAB;  Service: Cardiovascular;  Laterality: N/A; 08/16/2020: ABDOMINAL HYSTERECTOMY     Comment:  Dr. Arloa Dancer OBGYN 1986: BACK SURGERY 07/17/2017: COLONOSCOPY WITH PROPOFOL ; N/A     Comment:  Procedure: COLONOSCOPY WITH PROPOFOL ;  Surgeon: Viktoria Lamar DASEN, MD;  Location: [facility] ENDOSCOPY;  Service:               Endoscopy;  Laterality: N/A; No date: NASAL SINUS SURGERY 08/30/2021: PERIPHERAL VASCULAR INTERVENTION; Right     Comment:  Procedure:  PERIPHERAL VASCULAR INTERVENTION;  Surgeon:               Darron Deatrice LABOR, MD;  Location: [facility] INVAS[facility] CV LAB;                Service: Cardiovascular;  Laterality: Right;  common               Iliac 08/02/2020: TOTAL LAPAROSCOPIC HYSTERECTOMY WITH BILATERAL SALPINGO  OOPHORECTOMY; Bilateral     Comment:  Procedure: TOTAL LAPAROSCOPIC HYSTERECTOMY WITH               BILATERAL SALPINGO OOPHORECTOMY;  Surgeon: Arloa Lamar SQUIBB, MD;  Location: [facility] ORS;  Service: Gynecology;                Laterality: Bilateral;  RN TO ASSIST No date: UTERINE FIBROID SURGERY     Reproductive/Obstetrics negative OB ROS                              Anesthesia Physical Anesthesia Plan  ASA: 3  Anesthesia Plan: General   Post-op Pain Management:    Induction: Intravenous  PONV Risk Score and Plan: Propofol  infusion and TIVA  Airway Management Planned: Natural Airway and Nasal Cannula  Additional  Equipment:   Intra-op Plan:   Post-operative Plan:   Informed Consent: I have reviewed the patients History and Physical, chart, labs and discussed the procedure including the risks, benefits and alternatives for the proposed anesthesia with the patient or authorized representative who has indicated his/her understanding and acceptance.     Dental Advisory Given  Plan Discussed with: Anesthesiologist, CRNA and Surgeon  Anesthesia Plan Comments: (Patient consented for risks of anesthesia including but not limited to:  - adverse reactions to medications - risk of airway placement if required - damage to eyes, teeth, lips or other oral mucosa - nerve damage due to positioning  - sore throat or hoarseness - Damage to heart, brain, nerves, lungs, other parts of body or loss of life  Patient voiced understanding and assent.)        Anesthesia Quick Evaluation

## 2024-03-30 NOTE — Transfer of Care (Signed)
 Immediate Anesthesia Transfer of Care Note  Patient: Jenissa B Murrillo  Procedure(s) Performed: APPLICATION, ALLOGRAFT, SKIN (Right) DEBRIDEMENT, WOUND (Right)  Patient Location: PACU  Anesthesia Type:General  Level of Consciousness: awake, alert , and oriented  Airway & Oxygen Therapy: Patient Spontanous Breathing and Patient connected to face mask oxygen  Post-op Assessment: Report given to RN and Post -op Vital signs reviewed and stable  Post vital signs: Reviewed and stable  Last Vitals:  Vitals Value Taken Time  BP 117/63 03/30/24 08:21  Temp    Pulse 71 03/30/24 08:23  Resp 17 03/30/24 08:23  SpO2 98 % 03/30/24 08:23  Vitals shown include unfiled device data.  Last Pain:  Vitals:   03/30/24 0634  TempSrc: Temporal  PainSc: 0-No pain         Complications: No notable events documented.

## 2024-03-30 NOTE — Interval H&P Note (Signed)
 History and Physical Interval Note:  03/30/2024 7:28 AM  Karen Stephenson  has presented today for surgery, with the diagnosis of L97.512 ULCER RIGHT FOOT WITH FAT LAYER EXPOSED.  The various methods of treatment have been discussed with the patient and family. After consideration of risks, benefits and other options for treatment, the patient has consented to  Procedure(s) with comments: APPLICATION, ALLOGRAFT, SKIN (Right) - IV SEDATION DEBRIDEMENT, WOUND (Right) as a surgical intervention.  The patient's history has been reviewed, patient examined, no change in status, stable for surgery.  I have reviewed the patient's chart and labs.  Questions were answered to the patient's satisfaction.     Juliene JONELLE Medicine

## 2024-03-30 NOTE — Anesthesia Procedure Notes (Signed)
 Procedure Name: General with mask airway Date/Time: 03/30/2024 7:42 AM  Performed by: Dominica Krabbe, CRNAPre-anesthesia Checklist: Patient identified, Emergency Drugs available, Suction available, Patient being monitored and Timeout performed Patient Re-evaluated:Patient Re-evaluated prior to induction Oxygen Delivery Method: Simple face mask Preoxygenation: Pre-oxygenation with 100% oxygen Induction Type: IV induction Ventilation: Oral airway inserted - appropriate to patient size

## 2024-03-30 NOTE — OR PostOp (Incomplete)
 PACU TO INPATIENT HANDOFF REPORT  Name/Age/Gender Karen Stephenson 68 y.o. female  Code Status Code Status History     Date Active Date Inactive Code Status Order ID Comments User Context   08/30/2021 1146 08/30/2021 2026 Full Code [id number]  Darron Deatrice LABOR, MD Inpatient       Home/SNF/Other {Discharge Destination:18313::Home}  Chief Complaint [phone] ULCER RIGHT FOOT WITH FAT LAYER EXPOSED  Level of Care/Admitting Diagnosis ED Disposition     None       Medical History Past Medical History:  Diagnosis Date   Allergy    Anginal pain    a. 07/2020 MV: No ischemia/infarct-->low risk.   Arthritis of both hands    Asthma    Cataract    Family history of adverse reaction to anesthesia    a.) apnea and PONV in first degree female relative (sister)   GERD (gastroesophageal reflux disease)    History of echocardiogram    a. 07/2020 Echo: EF 60-65%, no rwma, nl RV fxn, mild Ao sclerosis w/o stenosis.   Hypercholesteremia    Hypertension    Long term current use of clopidogrel     Neuropathy    Osteomyelitis of great toe of right foot (HCC)    PAD (peripheral artery disease)    a. 08/2021 LE Angio/PTA: Heavily calcified short occlusion of the R CIA (s/p balloon expandable covered stent), mild nonobstructive infrainguinal disease.  Mild left SFA disease.  Borderline significant L popliteal disease with three-vessel runoff below the knee.   Sleep apnea    a.) no nocturnal PAP therapy   T2DM (type 2 diabetes mellitus) (HCC)    Ulcer of right foot with fat layer exposed (HCC)     Allergies Allergies  Allergen Reactions   Amoxicillin -Pot Clavulanate Diarrhea   Aspirin      Higher doses - passed out    Ducodyl [Bisacodyl]     Passed out, extreme cramping    Erythromycin     Stomach Ache Stomach pain.   Glipizide     Extreme stomach aches    Metformin Hcl     Extreme stomach cramps    Onion Other (See Comments)    Ingesting onions causes tongue burning, throat  swelling, abdominal pain    Latex Rash   Macrobid  [Nitrofurantoin ]     Severe diarrhea, severe abdominal pain/cramping    IV Location/Drains/Wounds Patient Lines/Drains/Airways Status     Active Line/Drains/Airways     Name Placement date Placement time Site Days   Peripheral IV 03/30/24 20 G Left;Posterior Hand 03/30/24  0650  Hand  less than 1   Incision - 4 Ports Abdomen Left;Lateral;Upper Left;Lateral;Mid Umbilicus Right;Lateral;Mid 08/02/20  0801  -- 1336   Wound 03/30/24 0813 Surgical Closed Surgical Incision Foot 03/30/24  0813  Foot  less than 1            Labs/Imaging Results for orders placed or performed during the hospital encounter of 03/30/24 (from the past 48 hours)  Glucose, capillary     Status: Abnormal   Collection Time: 03/30/24  6:32 AM  Result Value Ref Range   Glucose-Capillary 120 (H) 70 - 99 mg/dL    Comment: Glucose reference range applies only to samples taken after fasting for at least 8 hours.  Glucose, capillary     Status: Abnormal   Collection Time: 03/30/24  8:24 AM  Result Value Ref Range   Glucose-Capillary 122 (H) 70 - 99 mg/dL    Comment: Glucose reference range applies only to  samples taken after fasting for at least 8 hours.   No results found.  Pending Labs   Vitals/Pain Today's Vitals   03/30/24 0824 03/30/24 0830 03/30/24 0845 03/30/24 0900  BP:  134/73 (!) 150/81 (!) 148/73  Pulse: 73 (!) 58 (!) 51 (!) 50  Resp: 17 14 11 16   Temp:    (!) 96.9 F (36.1 C)  TempSrc:      SpO2: 98% 99% 99% 100%  PainSc:   0-No pain 0-No pain    Isolation Precautions @ISOLATION @  Administered Medications Periop Administered Meds from 03/30/2024 0651 to 03/30/2024 9089       Date/Time Order Dose Route Action Action by Comments    03/30/2024 0812 EDT 0.9 %  sodium chloride  infusion -- Intravenous Anesthesia Volume Adjustment Dominica Krabbe, CRNA --    03/30/2024 0739 EDT 0.9 %  sodium chloride  infusion -- Intravenous New Bag/Given  Dominica Krabbe, CRNA --    03/30/2024 0817 EDT 0.9 % irrigation (POUR BTL) 500 mL Irrigation Given Silva Juliene SAUNDERS, DPM available on back table    03/30/2024 0804 EDT acetaminophen  (OFIRMEV ) IV 1,000 mg Intravenous Given Dominica Krabbe, CRNA --    03/30/2024 0817 EDT bupivacaine  (MARCAINE ) 0.5 % (with pres) injection 10 mL Infiltration Given Silva Juliene SAUNDERS, DPM --    03/30/2024 0744 EDT fentaNYL  (SUBLIMAZE ) injection 50 mcg Intravenous Given Dominica Krabbe, CRNA --    03/30/2024 0744 EDT lidocaine  HCl (PF) (XYLOCAINE ) 2 % injection 80 mg Intradermal Given Dominica Krabbe, CRNA --    03/30/2024 0752 EDT ondansetron  (ZOFRAN ) injection 4 mg Intravenous Given Dominica Krabbe, CRNA --    03/30/2024 0815 EDT propofol  (DIPRIVAN ) 10 mg/mL bolus/IV push 0 mcg/kg/min Intravenous Parthenia Dominica Krabbe, CRNA --    03/30/2024 0806 EDT propofol  (DIPRIVAN ) 10 mg/mL bolus/IV push 80 mcg/kg/min Intravenous Rate/Dose Change Dominica Krabbe, CRNA --    03/30/2024 0745 EDT propofol  (DIPRIVAN ) 10 mg/mL bolus/IV push 100 mcg/kg/min Intravenous New Bag/Given Dominica Krabbe, CRNA --    03/30/2024 0744 EDT propofol  (DIPRIVAN ) 10 mg/mL bolus/IV push 60 mg Intravenous Given Dominica Krabbe, CRNA --       Mobility {Mobility:[id number]}

## 2024-03-30 NOTE — Anesthesia Postprocedure Evaluation (Signed)
 Anesthesia Post Note  Patient: Karen Stephenson  Procedure(s) Performed: APPLICATION, ALLOGRAFT, SKIN (Right) DEBRIDEMENT, WOUND (Right)  Patient location during evaluation: PACU Anesthesia Type: General Level of consciousness: awake and alert Pain management: pain level controlled Vital Signs Assessment: post-procedure vital signs reviewed and stable Respiratory status: spontaneous breathing, nonlabored ventilation and respiratory function stable Cardiovascular status: blood pressure returned to baseline and stable Postop Assessment: no apparent nausea or vomiting Anesthetic complications: no   No notable events documented.   Last Vitals:  Vitals:   03/30/24 0900 03/30/24 0927  BP: (!) 148/73 (!) 162/74  Pulse: (!) 50 (!) 50  Resp: 16 15  Temp: (!) 36.1 C   SpO2: 100% 100%    Last Pain:  Vitals:   03/30/24 0927  TempSrc:   PainSc: 0-No pain                 Fairy POUR [name]

## 2024-03-31 ENCOUNTER — Encounter: Payer: Self-pay | Admitting: Podiatry

## 2024-04-01 LAB — SURGICAL PATHOLOGY

## 2024-04-02 ENCOUNTER — Encounter: Payer: Self-pay | Admitting: Podiatry

## 2024-04-04 LAB — AEROBIC/ANAEROBIC CULTURE W GRAM STAIN (SURGICAL/DEEP WOUND)
Culture: NO GROWTH
Gram Stain: NONE SEEN

## 2024-04-06 LAB — AEROBIC/ANAEROBIC CULTURE W GRAM STAIN (SURGICAL/DEEP WOUND)
Culture: NO GROWTH
Gram Stain: NONE SEEN

## 2024-04-08 ENCOUNTER — Ambulatory Visit: Admitting: Podiatry

## 2024-04-08 VITALS — Ht 68.0 in | Wt 237.6 lb

## 2024-04-08 DIAGNOSIS — L97512 Non-pressure chronic ulcer of other part of right foot with fat layer exposed: Secondary | ICD-10-CM | POA: Diagnosis not present

## 2024-04-08 NOTE — Progress Notes (Signed)
  Subjective:  Patient ID: Karen Stephenson, female    DOB: 26-Jul-1955,  MRN: [id number]  Chief Complaint  Patient presents with   Post-op Follow-up    Rm 2 POV # 1 DOS 9/29 RT 5TH METATARSAL AND RT GREAT TOE BONE BIOPSY W/ SKIN SUBSITUTE APPLICATION TO TOE AND FOOT. Staples intact with minimum swelling.    68 y.o. female returns for post-op check.  She is doing well not have any issues has been changing the dressing as directed  Review of Systems: Negative except as noted in the HPI. Denies N/V/F/Ch.   Objective:  There were no vitals filed for this visit. Body mass index is 36.13 kg/m. Constitutional Well developed. Well nourished.  Vascular Foot warm and well perfused. Capillary refill normal to all digits.  Calf is soft and supple, no posterior calf or knee pain, negative Homans' sign  Neurologic Normal speech. Oriented to person, place, and time. Epicritic sensation to light touch grossly reduced bilaterally.  Dermatologic Skin healing well without signs of infection. Skin edges well coapted without signs of infection.  Graft integrating well  Orthopedic: Tenderness to palpation noted about the surgical site.   Culture of fifth metatarsal and hallux left tissue showed no growth on either sample  Osteomyelitis not observed in any biopsy site  Assessment:   1. Ulcer of right foot with fat layer exposed (HCC)    Plan:  Patient was evaluated and treated and all questions answered.  S/p foot surgery right -Progressing as expected post-operatively. - Adaptic and staples removed now may apply surgical lube directly to graft site -WB Status: Weightbearing as tolerated in surgical shoe -Sutures: Removed today. -Foot redressed.  No follow-ups on file.

## 2024-04-09 ENCOUNTER — Encounter: Payer: Self-pay | Admitting: Podiatry

## 2024-04-09 ENCOUNTER — Ambulatory Visit: Attending: Infectious Diseases | Admitting: Infectious Diseases

## 2024-04-09 ENCOUNTER — Other Ambulatory Visit
Admission: RE | Admit: 2024-04-09 | Discharge: 2024-04-09 | Disposition: A | Discharge status: Home / Self Care | Source: Ambulatory Visit | Attending: Infectious Diseases | Admitting: Infectious Diseases

## 2024-04-09 ENCOUNTER — Encounter: Payer: Self-pay | Admitting: Infectious Diseases

## 2024-04-09 VITALS — BP 119/79 | HR 76 | Temp 96.8°F | Ht 68.0 in | Wt 234.0 lb

## 2024-04-09 DIAGNOSIS — E1151 Type 2 diabetes mellitus with diabetic peripheral angiopathy without gangrene: Secondary | ICD-10-CM | POA: Diagnosis not present

## 2024-04-09 DIAGNOSIS — L089 Local infection of the skin and subcutaneous tissue, unspecified: Secondary | ICD-10-CM | POA: Diagnosis present

## 2024-04-09 DIAGNOSIS — L97519 Non-pressure chronic ulcer of other part of right foot with unspecified severity: Secondary | ICD-10-CM

## 2024-04-09 DIAGNOSIS — E1140 Type 2 diabetes mellitus with diabetic neuropathy, unspecified: Secondary | ICD-10-CM | POA: Insufficient documentation

## 2024-04-09 DIAGNOSIS — Z7984 Long term (current) use of oral hypoglycemic drugs: Secondary | ICD-10-CM

## 2024-04-09 DIAGNOSIS — Z7902 Long term (current) use of antithrombotics/antiplatelets: Secondary | ICD-10-CM | POA: Diagnosis not present

## 2024-04-09 DIAGNOSIS — M869 Osteomyelitis, unspecified: Secondary | ICD-10-CM | POA: Insufficient documentation

## 2024-04-09 DIAGNOSIS — E11621 Type 2 diabetes mellitus with foot ulcer: Secondary | ICD-10-CM

## 2024-04-09 LAB — SEDIMENTATION RATE: Sed Rate: 41 mm/h — ABNORMAL HIGH (ref 0–30)

## 2024-04-09 LAB — C-REACTIVE PROTEIN: CRP: 0.6 mg/dL (ref <1.0)

## 2024-04-09 NOTE — Patient Instructions (Signed)
 Today, we discussed the diabetic ulcer on your right foot, which has been persistent and worsened due to increased walking. We reviewed your recent surgery, current medications, and wound care routine. Your blood sugar control is good, and there are no signs of infection in the wound.  YOUR PLAN:  -TYPE 2 DIABETES MELLITUS WITH RIGHT FOOT ULCER: A diabetic ulcer is a sore that occurs on the skin of people with diabetes, often due to poor circulation and nerve damage. Your ulcer extends from under your little toe to your big toe. You recently had surgery, including a skin graft and bone biopsy, and are currently on doxycycline . Continue daily dressing changes with nonstick dressing and surgical gel to keep the wound moist.  Watch for signs of infection and dryness, and ensure the wound remains moist. We will perform blood tests to check for inflammation and follow up with Dr. Silva on October 29 for further evaluation.  INSTRUCTIONS:  Please follow up with Dr. Silva on October 29 for further evaluation of your foot ulcer. Continue with your current wound care routine and monitor for any signs of infection or dryness. Blood tests for inflammatory markers will be performed to assess for inflammation.

## 2024-04-09 NOTE — Progress Notes (Unsigned)
 [name]  DOB: 08/21/1955  MRN: [id number]  Date/Time: 04/09/2024 11:31 AM  Subjective:  She allowed the use of AI scribe ?Karen Stephenson is a 68 year old female with diabetes who presents with a diabetic ulcer on the right foot. She was referred by Dr. Geralene for evaluation of a diabetic ulcer on the right foot.  The diabetic ulcer on her right foot began underneath the little toe from a recurring callus. It has been persistent, with periods of opening and closing, and worsened in July 2024 when her husband was hospitalized, necessitating frequent walking, which led to the ulcer reopening and spreading to her big toe. Despite various treatments, the ulcer deepened, prompting a referral.  Surgery was performed on March 30, 2024, including a skin graft and bone biopsy. She is currently on doxycycline , which she started on the day of the surgery, and is performing her own wound dressings at home using a surgical gel to keep the wound moist, changing the dressing daily.  She has a history of diabetes, currently managed with Januvia  and Archivos. She previously took Jardiance  but discontinued it due to concerns about potential side effects on her limbs. Her last recorded A1c was 6.4, indicating good glycemic control.  She also has a history of hypertension, managed with losartan  and hydrochlorothiazide , and peripheral artery disease, having had a stent placed in her right leg in 2023. She takes Plavix  for post-circulation issues.  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 She manages her own wound care at home and does not have any pets.   Past Medical History:  Diagnosis Date   Allergy    Anginal pain    a. 07/2020 MV: No ischemia/infarct-->low risk.   Arthritis of both hands    Asthma    Cataract    Family history of adverse reaction to anesthesia    a.) apnea and PONV in first degree female relative (sister)   GERD  (gastroesophageal reflux disease)    History of echocardiogram    a. 07/2020 Echo: EF 60-65%, no rwma, nl RV fxn, mild Ao sclerosis w/o stenosis.   Hypercholesteremia    Hypertension    Long term current use of clopidogrel     Neuropathy    Osteomyelitis of great toe of right foot (HCC)    PAD (peripheral artery disease)    a. 08/2021 LE Angio/PTA: Heavily calcified short occlusion of the R CIA (s/p balloon expandable covered stent), mild nonobstructive infrainguinal disease.  Mild left SFA disease.  Borderline significant L popliteal disease with three-vessel runoff below the knee.   Sleep apnea    a.) no nocturnal PAP therapy   T2DM (type 2 diabetes mellitus) (HCC)    Ulcer of right foot with fat layer exposed [facility])     Past Surgical History:  Procedure Laterality Date   ABDOMINAL AORTOGRAM W/LOWER EXTREMITY N/A 08/30/2021   Procedure: ABDOMINAL AORTOGRAM W/LOWER EXTREMITY;  Surgeon: Darron Deatrice LABOR, MD;  Location: [facility] INVASIVE CV LAB;  Service: Cardiovascular;  Laterality: N/A;   ABDOMINAL HYSTERECTOMY  08/16/2020   Dr. Arloa Dancer OBGYN   ALLOGRAFT APPLICATION Right 03/30/2024   Procedure: APPLICATION, ALLOGRAFT, SKIN;  Surgeon: Silva Juliene SAUNDERS, DPM;  Location: [facility] ORS;  Service: Orthopedics/Podiatry;  Laterality: Right;  IV SEDATION   BACK SURGERY  1986   COLONOSCOPY WITH PROPOFOL  N/A 07/17/2017   Procedure: COLONOSCOPY WITH PROPOFOL ;  Surgeon: Viktoria Lamar DASEN, MD;  Location: [facility] ENDOSCOPY;  Service:  Endoscopy;  Laterality: N/A;   NASAL SINUS SURGERY     PERIPHERAL VASCULAR INTERVENTION Right 08/30/2021   Procedure: PERIPHERAL VASCULAR INTERVENTION;  Surgeon: Darron Deatrice LABOR, MD;  Location: [facility] INVASIVE CV LAB;  Service: Cardiovascular;  Laterality: Right;  common Iliac   TOTAL LAPAROSCOPIC HYSTERECTOMY WITH BILATERAL SALPINGO OOPHORECTOMY Bilateral 08/02/2020   Procedure: TOTAL LAPAROSCOPIC HYSTERECTOMY WITH BILATERAL SALPINGO OOPHORECTOMY;  Surgeon: Arloa Lamar SQUIBB, MD;   Location: [facility] ORS;  Service: Gynecology;  Laterality: Bilateral;  RN TO ASSIST   UTERINE FIBROID SURGERY     WOUND DEBRIDEMENT Right 03/30/2024   Procedure: DEBRIDEMENT, WOUND;  Surgeon: Silva Juliene SAUNDERS, DPM;  Location: [facility] ORS;  Service: Orthopedics/Podiatry;  Laterality: Right;    Social History   Socioeconomic History   Marital status: Married    Spouse name: Witham,Lewis E (Spouse)   Number of children: Not on file   Years of education: Not on file   Highest education level: Associate degree: academic program  Occupational History   Not on file  Tobacco Use   Smoking status: Former    Current packs/day: 0.00    Types: Cigarettes    Quit date: 07/01/2013    Years since quitting: 10.7   Smokeless tobacco: Never  Vaping Use   Vaping status: Never Used  Substance and Sexual Activity   Alcohol use: No   Drug use: No   Sexual activity: Yes    Comment: Hysterectomy  Other Topics Concern   Not on file  Social History Narrative   Not on file   Social Drivers of Health   Financial Resource Strain: Low Risk  (02/29/2024)   Overall Financial Resource Strain (CARDIA)    Difficulty of Paying Living Expenses: Not very hard  Food Insecurity: No Food Insecurity (02/29/2024)   Hunger Vital Sign    Worried About Running Out of Food in the Last Year: Never true    Ran Out of Food in the Last Year: Never true  Transportation Needs: No Transportation Needs (02/29/2024)   PRAPARE - Administrator, Civil Service (Medical): No    Lack of Transportation (Non-Medical): No  Physical Activity: Insufficiently Active (11/27/2023)   Exercise Vital Sign    Days of Exercise per Week: 2 days    Minutes of Exercise per Session: 20 min  Stress: No Stress Concern Present (02/29/2024)   [employer] of Occupational Health - Occupational Stress Questionnaire    Feeling of Stress: Not at all  Social Connections: Socially Integrated (02/29/2024)   Social Connection and Isolation Panel     Frequency of Communication with Friends and Family: More than three times a week    Frequency of Social Gatherings with Friends and Family: Once a week    Attends Religious Services: More than 4 times per year    Active Member of [employer] or Organizations: Yes    Attends Engineer, structural: More than 4 times per year    Marital Status: Married  Catering manager Violence: Not At Risk (11/27/2023)   Humiliation, Afraid, Rape, and Kick questionnaire    Fear of Current or Ex-Partner: No    Emotionally Abused: No    Physically Abused: No    Sexually Abused: No    Family History  Problem Relation Age of Onset   Diabetes Mother    Breast cancer Mother 58   Arthritis Mother    Cancer Mother    Heart disease Father    Alcohol abuse Father    Breast  cancer Sister 66       dx twice    Cancer Sister    Heart disease Paternal Grandfather    Hypertension Brother    Colon cancer Neg Hx    Ovarian cancer Neg Hx    Allergies  Allergen Reactions   Amoxicillin -Pot Clavulanate Diarrhea   Aspirin      Higher doses - passed out    Ducodyl [Bisacodyl]     Passed out, extreme cramping    Erythromycin     Stomach Ache Stomach pain.   Glipizide     Extreme stomach aches    Metformin Hcl     Extreme stomach cramps    Onion Other (See Comments)    Ingesting onions causes tongue burning, throat swelling, abdominal pain    Latex Rash   Macrobid  [Nitrofurantoin ]     Severe diarrhea, severe abdominal pain/cramping   I? Current Outpatient Medications  Medication Sig Dispense Refill   acarbose  (PRECOSE ) 25 MG tablet TAKE 1 TABLET BY MOUTH THREE TIMES DAILY WITH MEALS 270 tablet 0   acetaminophen  (TYLENOL ) 650 MG CR tablet Take 1,300 mg by mouth every 8 (eight) hours as needed for pain.     albuterol  (VENTOLIN  HFA) 108 (90 Base) MCG/ACT inhaler Inhale 2 puffs into the lungs every 6 (six) hours as needed for wheezing or shortness of breath. 8 g 2   cetirizine (ZYRTEC) 10 MG tablet Take 10 mg  by mouth daily.     clopidogrel  (PLAVIX ) 75 MG tablet Take 1 tablet by mouth once daily 90 tablet 2   Continuous Glucose Sensor (FREESTYLE LIBRE 3 PLUS SENSOR) MISC Change sensor every 15 days. 2 each    doxycycline  (VIBRA -TABS) 100 MG tablet Take 1 tablet (100 mg total) by mouth 2 (two) times daily. 28 tablet 0   fluticasone  (FLONASE ) 50 MCG/ACT nasal spray Place 2 sprays into both nostrils daily. 16 g 6   fluticasone -salmeterol (ADVAIR DISKUS) 250-50 MCG/ACT AEPB Inhale 1 puff into the lungs in the morning and at bedtime. 60 each 0   glucose blood test strip Check sugar once daily, E 11.9 25 each 12   Homeopathic Products (LEG CRAMP RELIEF PO) Take 2 capsules by mouth daily.      hydrochlorothiazide  (HYDRODIURIL ) 25 MG tablet Take 1 tablet by mouth once daily 90 tablet 0   hyoscyamine  (LEVBID ) 0.375 MG 12 hr tablet Take 1 tablet (0.375 mg total) by mouth as needed. 60 tablet 3   JANUVIA  100 MG tablet Take 1 tablet by mouth once daily 90 tablet 0   Lancets (ONETOUCH ULTRASOFT) lancets Check sugar once daily ,DX E11.9 30 each 12   losartan  (COZAAR ) 100 MG tablet Take 1 tablet by mouth once daily 90 tablet 2   montelukast  (SINGULAIR ) 10 MG tablet Take 1 tablet (10 mg total) by mouth at bedtime. 90 tablet 3   pantoprazole  (PROTONIX ) 40 MG tablet Take 1 tablet (40 mg total) by mouth daily. 90 tablet 2   rosuvastatin  (CRESTOR ) 20 MG tablet Take 1 tablet (20 mg total) by mouth daily. 90 tablet 3   No current facility-administered medications for this visit.     Abtx:  Anti-infectives (From admission, onward)    None       REVIEW OF SYSTEMS:  Const: negative fever, negative chills, negative weight loss Eyes: negative diplopia or visual changes, negative eye pain ENT: negative coryza, negative sore throat Resp: negative cough, hemoptysis, dyspnea Cards: negative for chest pain, palpitations, lower extremity edema GU: negative for  frequency, dysuria and hematuria GI: Negative for abdominal  pain, diarrhea, bleeding, constipation Skin: negative for rash and pruritus Heme: negative for easy bruising and gum/nose bleeding MS: as above Neurolo:negative for headaches, dizziness, vertigo, memory problems  Psych: negative for feelings of anxiety, depression  Endocrine: negative for thyroid , diabetes Allergy/Immunology- as above Objective:  VITALS:  BP 119/79   Pulse 76   Temp (!) 96.8 F (36 C) (Oral)   Ht 5' 8 (1.727 m)   Wt 234 lb (106.1 kg)   SpO2 97%   BMI 35.58 kg/m   PHYSICAL EXAM:  General: Alert, cooperative, no distress, appears stated age.  Head: Normocephalic, without obvious abnormality, atraumatic. Eyes: Conjunctivae clear, anicteric sclerae. Pupils are equal ENT Nares normal. No drainage or sinus tenderness. Lips, mucosa, and tongue normal. No Thrush Neck: Supple, symmetrical, no adenopathy, thyroid : non tender no carotid bruit and no JVD. Back: No CVA tenderness. Lungs: Clear to auscultation bilaterally. No Wheezing or Rhonchi. No rales. Heart: Regular rate and rhythm, no murmur, rub or gallop. Abdomen: Soft, non-tender,not distended. Bowel sounds normal. No masses Extremities:     Skin: No rashes or lesions. Or bruising Lymph: Cervical, supraclavicular normal. Neurologic: Grossly non-focal Pertinent Labs Lab Results CBC    Component Value Date/Time   WBC 5.6 03/26/2024 1059   RBC 4.58 03/26/2024 1059   HGB 12.6 03/26/2024 1059   HGB 13.8 08/23/2021 1101   HCT 40.0 03/26/2024 1059   HCT 42.1 08/23/2021 1101   PLT 243 03/26/2024 1059   PLT 260 08/23/2021 1101   MCV 87.3 03/26/2024 1059   MCV 84 08/23/2021 1101   MCH 27.5 03/26/2024 1059   MCHC 31.5 03/26/2024 1059   RDW 13.2 03/26/2024 1059   RDW 12.9 08/23/2021 1101   LYMPHSABS 1.6 08/23/2021 1101   EOSABS 0.2 08/23/2021 1101   BASOSABS 0.1 08/23/2021 1101       Latest Ref Rng & Units 03/26/2024   10:59 AM 12/04/2023    2:07 PM 05/08/2023    2:34 PM  CMP  Glucose 70 - 99 mg/dL  883  866  789   BUN 8 - 23 mg/dL 15  13  12    Creatinine 0.44 - 1.00 mg/dL 9.28  9.17  9.27   Sodium 135 - 145 mmol/L 139  144  140   Potassium 3.5 - 5.1 mmol/L 3.4  3.6  4.2   Chloride 98 - 111 mmol/L 102  105  105   CO2 22 - 32 mmol/L 26  23  23    Calcium  8.9 - 10.3 mg/dL 9.1  9.9  9.7   Total Protein 6.0 - 8.5 g/dL  7.6    Total Bilirubin 0.0 - 1.2 mg/dL  0.5    Alkaline Phos 44 - 121 IU/L  68    AST 0 - 40 IU/L  18    ALT 0 - 32 IU/L  15        Microbiology: Bone culture NG ? Impression/Recommendation Type 2 diabetes mellitus with right foot ulcer   The chronic right foot ulcer on the  little toe and  the big toe, associated with type 2 diabetes mellitus. It persists due to increased walking, pressure, neuropathy, and dry skin. Recent surgical intervention included debridement, skin grafting, and bone biopsy. Postoperative assessment shows no infection, with negative bacterial cultures.  Pathology neg for osteo 5th toe- 1st toe specimen in adequate The wound is dry, gravelly, and healing well. A1c is 6.4, indicating good glycemic control. Continue daily  dressing changes with nonstick dressing and surgical gel to maintain moisture. Use antiseptic impregnated Vaseline wrap until surgical gel is available. Monitor for signs of infection and dryness, ensuring the wound remains moist. Perform blood tests for inflammatory markers (sed rate and CRP) to assess for inflammation. Follow up with Dr. Silva on October 29 for further evaluation.? ? No follow up needed with me

## 2024-04-10 ENCOUNTER — Ambulatory Visit: Payer: Self-pay | Admitting: Infectious Diseases

## 2024-04-22 ENCOUNTER — Encounter: Admitting: Podiatry

## 2024-04-27 ENCOUNTER — Other Ambulatory Visit: Payer: Self-pay | Admitting: Family Medicine

## 2024-04-27 DIAGNOSIS — I10 Essential (primary) hypertension: Secondary | ICD-10-CM

## 2024-04-29 ENCOUNTER — Ambulatory Visit (INDEPENDENT_AMBULATORY_CARE_PROVIDER_SITE_OTHER): Admitting: Podiatry

## 2024-04-29 VITALS — Ht 68.0 in | Wt 234.0 lb

## 2024-04-29 DIAGNOSIS — L97512 Non-pressure chronic ulcer of other part of right foot with fat layer exposed: Secondary | ICD-10-CM | POA: Diagnosis not present

## 2024-04-30 NOTE — Progress Notes (Signed)
  Subjective:  Patient ID: Karen Stephenson, female    DOB: Jan 14, 1956,  MRN: [id number]  Chief Complaint  Patient presents with   Post-op Follow-up    RM 3 POV # 2 DOS 9/29 RT 5TH METATARSAL AND RT GREAT TOE BONE BIOPSY W/ SKIN SUBSITUTE APPLICATION TO TOE AND FOOT. Surgical site is scabbed over with no visible drainage.    68 y.o. female returns for post-op check.    Review of Systems: Negative except as noted in the HPI. Denies N/V/F/Ch.   Objective:  There were no vitals filed for this visit. Body mass index is 35.58 kg/m. Constitutional Well developed. Well nourished.  Vascular Foot warm and well perfused. Capillary refill normal to all digits.  Calf is soft and supple, no posterior calf or knee pain, negative Homans' sign  Neurologic Normal speech. Oriented to person, place, and time. Epicritic sensation to light touch grossly reduced bilaterally.  Dermatologic Graft is well-integrated without residual ulceration  Orthopedic: Tenderness to palpation noted about the surgical site.   Culture of fifth metatarsal and hallux left tissue showed no growth on either sample  Osteomyelitis not observed in any biopsy site  Assessment:   1. Ulcer of right foot with fat layer exposed (HCC)    Plan:  Patient was evaluated and treated and all questions answered.  S/p foot surgery right Doing well graft is well-integrated without residual ulceration.  Can discontinue significant dressing changes and utilize Neosporin and a Band-Aid for any partial breakdown areas.  Return in 1 month to reevaluate.  No follow-ups on file.

## 2024-05-06 ENCOUNTER — Encounter: Payer: Self-pay | Admitting: Family Medicine

## 2024-05-06 ENCOUNTER — Ambulatory Visit: Admitting: Family Medicine

## 2024-05-06 VITALS — BP 128/62 | HR 68 | Ht 68.0 in | Wt 239.6 lb

## 2024-05-06 DIAGNOSIS — I10 Essential (primary) hypertension: Secondary | ICD-10-CM | POA: Diagnosis not present

## 2024-05-06 DIAGNOSIS — E1142 Type 2 diabetes mellitus with diabetic polyneuropathy: Secondary | ICD-10-CM

## 2024-05-06 DIAGNOSIS — E11621 Type 2 diabetes mellitus with foot ulcer: Secondary | ICD-10-CM

## 2024-05-06 DIAGNOSIS — Z7984 Long term (current) use of oral hypoglycemic drugs: Secondary | ICD-10-CM

## 2024-05-06 DIAGNOSIS — E1169 Type 2 diabetes mellitus with other specified complication: Secondary | ICD-10-CM | POA: Diagnosis not present

## 2024-05-06 DIAGNOSIS — I739 Peripheral vascular disease, unspecified: Secondary | ICD-10-CM | POA: Insufficient documentation

## 2024-05-06 DIAGNOSIS — E785 Hyperlipidemia, unspecified: Secondary | ICD-10-CM

## 2024-05-06 DIAGNOSIS — K582 Mixed irritable bowel syndrome: Secondary | ICD-10-CM

## 2024-05-06 DIAGNOSIS — L97511 Non-pressure chronic ulcer of other part of right foot limited to breakdown of skin: Secondary | ICD-10-CM

## 2024-05-06 MED ORDER — BLOOD GLUCOSE MONITORING SUPPL DEVI: 1.0000 | 0 refills | Status: DC

## 2024-05-06 MED ORDER — LANCETS MISC
1.0000 | 0 refills | Status: AC
Start: 2024-05-06 — End: ?

## 2024-05-06 MED ORDER — LANCET DEVICE MISC: 1.0000 | 0 refills | Status: AC

## 2024-05-06 MED ORDER — BLOOD GLUCOSE TEST VI STRP
1.0000 | ORAL_STRIP | 0 refills | Status: AC
Start: 2024-05-06 — End: ?

## 2024-05-06 NOTE — Progress Notes (Signed)
 Established patient visit   Patient: Karen Stephenson   DOB: Jul 12, 1955   68 y.o. Female  MRN: [id number] Visit Date: 05/06/2024  Today's healthcare provider: Rockie Agent, MD   Chief Complaint  Patient presents with   Follow-up    DM f/u after holding jardiance . Feeling fine, has not start using freestyle sensors yet.  Eye exam is scheduled for 11/24   Diabetes   Subjective     HPI     Follow-up    Additional comments: DM f/u after holding jardiance . Feeling fine, has not start using freestyle sensors yet.  Eye exam is scheduled for 11/24      Last edited by Cherry Chiquita HERO, CMA on 05/06/2024  1:59 PM.       Discussed the use of AI scribe software for clinical note transcription with the patient, who gave verbal consent to proceed.  History of Present Illness Karen Stephenson is a 68 year old female with chronic diabetes who presents for follow-up.  She is managing her diabetes with acarbose  25 mg three times daily and Januvia  100 mg daily, but has paused Jardiance . Her last HbA1c was 6.5% in September. She has not started using the [facility] for glucose monitoring due to issues with her current meter and inability to find strips, although she has been provided with sample sensors.  Her hypertension is managed with hydrochlorothiazide  25 mg daily and losartan  100 mg daily. She has a history of hyperlipidemia associated with diabetes and is on Crestor  20 mg daily. She also takes Protonix  40 mg daily for chronic GERD.  She had surgery for her diabetic foot wound, which involved a skin graft using cod skin. There was no infection to the bone, and she is now out of bandages and can shower.  She has diabetic neuropathy and B12 deficiency, which complicate her foot condition. She takes Plavix  75 mg daily following a stent placement in the lower abdomen leading to the left leg.  No issues with sleep apnea, despite it being noted in her chart. She did not  complete the sleep study, and no CPAP machine was recommended. She denies any sleep disturbances.  She is considering getting the shingles and tetanus vaccines after discussing with her sister, who experienced mild side effects from the shingles vaccine. She plans to get vaccinated when her schedule allows for recovery time.  She takes medication for IBS as needed, specifically when consuming trigger foods like onions.     Past Medical History:  Diagnosis Date   Allergy    Anginal pain    a. 07/2020 MV: No ischemia/infarct-->low risk.   Arthritis of both hands    Asthma    Cataract    Family history of adverse reaction to anesthesia    a.) apnea and PONV in first degree female relative (sister)   GERD (gastroesophageal reflux disease)    History of echocardiogram    a. 07/2020 Echo: EF 60-65%, no rwma, nl RV fxn, mild Ao sclerosis w/o stenosis.   Hypercholesteremia    Hypertension    Long term current use of clopidogrel     Neuropathy    Osteomyelitis of great toe of right foot (HCC)    PAD (peripheral artery disease)    a. 08/2021 LE Angio/PTA: Heavily calcified short occlusion of the R CIA (s/p balloon expandable covered stent), mild nonobstructive infrainguinal disease.  Mild left SFA disease.  Borderline significant L popliteal disease with three-vessel runoff below the knee.  Sleep apnea    a.) no nocturnal PAP therapy   T2DM (type 2 diabetes mellitus) (HCC)    Ulcer of right foot with fat layer exposed (HCC)     Medications: Outpatient Medications Prior to Visit  Medication Sig   acarbose  (PRECOSE ) 25 MG tablet TAKE 1 TABLET BY MOUTH THREE TIMES DAILY WITH MEALS   acetaminophen  (TYLENOL ) 650 MG CR tablet Take 1,300 mg by mouth every 8 (eight) hours as needed for pain.   albuterol  (VENTOLIN  HFA) 108 (90 Base) MCG/ACT inhaler Inhale 2 puffs into the lungs every 6 (six) hours as needed for wheezing or shortness of breath.   cetirizine (ZYRTEC) 10 MG tablet Take 10 mg by mouth  daily.   clopidogrel  (PLAVIX ) 75 MG tablet Take 1 tablet by mouth once daily   Continuous Glucose Sensor (FREESTYLE LIBRE 3 PLUS SENSOR) MISC Change sensor every 15 days.   doxycycline  (VIBRA -TABS) 100 MG tablet Take 1 tablet (100 mg total) by mouth 2 (two) times daily.   fluticasone  (FLONASE ) 50 MCG/ACT nasal spray Place 2 sprays into both nostrils daily.   fluticasone -salmeterol (ADVAIR DISKUS) 250-50 MCG/ACT AEPB Inhale 1 puff into the lungs in the morning and at bedtime.   glucose blood test strip Check sugar once daily, E 11.9   Homeopathic Products (LEG CRAMP RELIEF PO) Take 2 capsules by mouth daily.    hydrochlorothiazide  (HYDRODIURIL ) 25 MG tablet Take 1 tablet by mouth once daily   hyoscyamine  (LEVBID ) 0.375 MG 12 hr tablet Take 1 tablet (0.375 mg total) by mouth as needed.   JANUVIA  100 MG tablet Take 1 tablet by mouth once daily   Lancets (ONETOUCH ULTRASOFT) lancets Check sugar once daily ,DX E11.9   losartan  (COZAAR ) 100 MG tablet Take 1 tablet by mouth once daily   montelukast  (SINGULAIR ) 10 MG tablet Take 1 tablet (10 mg total) by mouth at bedtime.   pantoprazole  (PROTONIX ) 40 MG tablet Take 1 tablet (40 mg total) by mouth daily.   rosuvastatin  (CRESTOR ) 20 MG tablet Take 1 tablet (20 mg total) by mouth daily.   No facility-administered medications prior to visit.    Review of Systems  Last hemoglobin A1c Lab Results  Component Value Date   HGBA1C 6.5 (A) 03/04/2024        Objective    BP 128/62 (BP Location: Right Arm, Patient Position: Sitting, Cuff Size: Normal) Comment: manual  Pulse 68   Ht 5' 8 (1.727 m)   Wt 239 lb 9.6 oz (108.7 kg)   SpO2 100%   BMI 36.43 kg/m   BP Readings from Last 3 Encounters:  05/06/24 128/62  04/09/24 119/79  03/30/24 (!) 162/74   Wt Readings from Last 3 Encounters:  05/06/24 239 lb 9.6 oz (108.7 kg)  04/29/24 234 lb (106.1 kg)  04/09/24 234 lb (106.1 kg)        Physical Exam Vitals reviewed.  Constitutional:       General: She is not in acute distress.    Appearance: Normal appearance. She is not ill-appearing.  Cardiovascular:     Rate and Rhythm: Normal rate and regular rhythm.  Pulmonary:     Effort: Pulmonary effort is normal. No respiratory distress.     Breath sounds: No wheezing, rhonchi or rales.  Feet:     Comments: Right surgical shoe Neurological:     Mental Status: She is alert and oriented to person, place, and time.  Psychiatric:        Mood and Affect: Mood normal.  Behavior: Behavior normal.       No results found for any visits on 05/06/24.  Assessment & Plan     Problem List Items Addressed This Visit     Diabetes mellitus, type 2 (HCC) - Primary   Relevant Medications   Blood Glucose Monitoring Suppl DEVI   Glucose Blood (BLOOD GLUCOSE TEST STRIPS) STRP   Lancet Device MISC   Lancets MISC   Diabetic foot ulcer (HCC)   Essential (primary) hypertension   Hyperlipidemia associated with type 2 diabetes mellitus (HCC)   Irritable bowel syndrome with both constipation and diarrhea   PAD (peripheral artery disease)     Assessment & Plan Type 2 diabetes mellitus with foot ulcer and diabetic polyneuropathy Chronic diabetes with recent foot ulcer surgery. A1c was 6.5% in September, indicating good control. Foot ulcer healing well post-surgery with no infection. Diabetic neuropathy and B12 deficiency present. Jardiance  was paused, and blood glucose monitoring is needed to assess control without it. She is motivated to maintain good control and reduce A1c further. - Ordered new blood glucose meter and supplies - Continue acarbose  25 mg three times daily - Continue Januvia  100 mg daily - Continue regular follow-up with podiatrist - Scheduled follow-up in three months to reassess A1c and diabetes management  Essential hypertension Chronic  Blood pressure mildly elevated at 140/81 mmHg. Current management includes hydrochlorothiazide  and losartan . - Rechecked blood  pressure, improved to 128/62 - Continue hydrochlorothiazide  25 mg daily - Continue losartan  100 mg daily  Peripheral vascular disease with left lower extremity stent Chronic  stable Peripheral vascular disease managed with Plavix  post-stent placement in the left lower extremity. - Continue Plavix  75 mg daily  Mixed irritable bowel syndrome Chronic  Stable  Managed with Levbid  as needed. Recent episode triggered by onions in chili. - Continue Levbid  0.375 mg as needed  Chronic gastroesophageal reflux disease Managed with Protonix . - Continue Protonix  40 mg daily  General Health Maintenance Eye exam scheduled for November 24th. Discussion about shingles vaccine, with plans to receive it after husband's cataract surgery appointments. Tetanus vaccine also discussed. - Proceed with DM eye exam on November 24th - Plan to receive shingles vaccine after husband's cataract surgery appointments - recommend tetanus vaccine     Return in about 3 months (around 08/06/2024) for DM, HTN, Chronic F/U.         Rockie Agent, MD  [facility] [phone] (phone) [phone] (fax)  [facility]

## 2024-05-06 NOTE — Patient Instructions (Signed)
 To keep you healthy, please keep in mind the following health maintenance items that you are due for:   Health Maintenance Due  Topic Date Due   Zoster Vaccines- Shingrix (1 of 2) Never done   OPHTHALMOLOGY EXAM  08/04/2020   DTaP/Tdap/Td (3 - Td or Tdap) 04/17/2021     Best Wishes,   Dr. Lang

## 2024-05-08 ENCOUNTER — Other Ambulatory Visit: Payer: Self-pay | Admitting: Family Medicine

## 2024-05-08 DIAGNOSIS — E119 Type 2 diabetes mellitus without complications: Secondary | ICD-10-CM

## 2024-05-13 ENCOUNTER — Encounter: Admitting: Podiatry

## 2024-05-27 ENCOUNTER — Ambulatory Visit: Admitting: Podiatry

## 2024-05-27 ENCOUNTER — Ambulatory Visit

## 2024-05-27 DIAGNOSIS — E1142 Type 2 diabetes mellitus with diabetic polyneuropathy: Secondary | ICD-10-CM | POA: Diagnosis not present

## 2024-05-27 DIAGNOSIS — B351 Tinea unguium: Secondary | ICD-10-CM

## 2024-05-27 DIAGNOSIS — M869 Osteomyelitis, unspecified: Secondary | ICD-10-CM

## 2024-05-27 DIAGNOSIS — L97512 Non-pressure chronic ulcer of other part of right foot with fat layer exposed: Secondary | ICD-10-CM | POA: Diagnosis not present

## 2024-05-27 DIAGNOSIS — L84 Corns and callosities: Secondary | ICD-10-CM | POA: Diagnosis not present

## 2024-05-27 NOTE — Progress Notes (Signed)
  Subjective:  Patient ID: Karen Stephenson, female    DOB: August 27, 1955,  MRN: [id number]  Chief Complaint  Patient presents with   Routine Post Op    POV # 3 DOS 9/29 RT 5TH METATARSAL AND RT GREAT TOE BONE BIOPSY W/ SKIN SUBSITUTE APPLICATION TO TOE AND FOOT.    68 y.o. female returns for post-op check.  She returns for follow-up she has had no drainage  Review of Systems: Negative except as noted in the HPI. Denies N/V/F/Ch.   Objective:  There were no vitals filed for this visit. There is no height or weight on file to calculate BMI. Constitutional Well developed. Well nourished.  Vascular Foot warm and well perfused. Capillary refill normal to all digits.  Calf is soft and supple, no posterior calf or knee pain, negative Homans' sign  Neurologic Normal speech. Oriented to person, place, and time. Epicritic sensation to light touch grossly reduced bilaterally.  Dermatologic Right hallux and right submetatarsal 5 ulcerations have completely healed with good epithelialization.  She has thickened elongated mycotic nails x 10.  Preulcerative callus bilateral submetatarsal 5 and medial hallux  Orthopedic: She has no pain to palpation noted about the surgical site.   Culture of fifth metatarsal and hallux left tissue showed no growth on either sample  Osteomyelitis not observed in any biopsy site  Radiographs taken today show unchanged appearance and cortication of the distal phalanx of the right hallux   Assessment:   1. Osteomyelitis of great toe of right foot (HCC)   2. Ulcer of right foot with fat layer exposed (HCC)   3. Callus of foot   4. Onychomycosis   5. Type 2 diabetes mellitus with diabetic polyneuropathy, without long-term current use of insulin (HCC)    Plan:  Patient was evaluated and treated and all questions answered.  S/p foot surgery right Doing much better the graft is well-healed and she has no residual ulceration.  Hopefully should not have much risk of  recurrence.  Does not need further wound care at this point.  Discussed the etiology and treatment options for the condition in detail with the patient. Recommended debridement of the nails today. Sharp and mechanical debridement performed of all painful and mycotic nails today. Nails debrided in length and thickness using a nail nipper to level of comfort. Follow up as needed for painful nails.    All symptomatic hyperkeratoses were safely debrided with a sterile #15 blade to patient's level of comfort without incident. We discussed preventative and palliative care of these lesions including supportive and accommodative shoegear, padding, prefabricated and custom molded accommodative orthoses, use of a pumice stone and lotions/creams daily.  Return in about 9 weeks (around 07/29/2024) for at risk diabetic foot care.

## 2024-06-01 ENCOUNTER — Other Ambulatory Visit: Payer: Self-pay | Admitting: Family Medicine

## 2024-06-01 DIAGNOSIS — E1142 Type 2 diabetes mellitus with diabetic polyneuropathy: Secondary | ICD-10-CM

## 2024-06-21 ENCOUNTER — Encounter: Payer: Self-pay | Admitting: Family Medicine

## 2024-06-22 ENCOUNTER — Other Ambulatory Visit: Payer: Self-pay

## 2024-06-22 DIAGNOSIS — E1142 Type 2 diabetes mellitus with diabetic polyneuropathy: Secondary | ICD-10-CM

## 2024-06-22 MED ORDER — FREESTYLE LIBRE 3 PLUS SENSOR MISC: 1 refills | Status: AC

## 2024-06-23 ENCOUNTER — Telehealth: Payer: Self-pay | Admitting: Pharmacy Technician

## 2024-06-23 ENCOUNTER — Other Ambulatory Visit (HOSPITAL_COMMUNITY): Payer: Self-pay

## 2024-06-23 NOTE — Telephone Encounter (Signed)
 Pharmacy Patient Advocate Encounter   Received notification from Onbase that prior authorization for FreeStyle Libre 3 Plus Sensor is required/requested.   Insurance verification completed.   The patient is insured through [employer].   To help increase the likelihood of successful prior authorization for a continuous glucose monitor, please review the documentation and include evidence of at least two hypoglycemic events (insurers often require glucose values <=54 mg/dL) and/or clear documentation that the patient relies on insulin to adequately control their diabetes. Thank you for your partnership.   CMM Key# [id number]

## 2024-07-06 ENCOUNTER — Other Ambulatory Visit: Payer: Self-pay | Admitting: Family Medicine

## 2024-07-06 DIAGNOSIS — I10 Essential (primary) hypertension: Secondary | ICD-10-CM

## 2024-07-17 NOTE — Telephone Encounter (Signed)
 Please see separate encounter from 06/23/24. Our team reached out previously to request additional information to complete PA. CGM PA approval requires documentation of patient using daily insulin.

## 2024-07-27 ENCOUNTER — Other Ambulatory Visit: Payer: Self-pay | Admitting: Family Medicine

## 2024-07-27 DIAGNOSIS — E119 Type 2 diabetes mellitus without complications: Secondary | ICD-10-CM

## 2024-07-28 NOTE — Telephone Encounter (Signed)
 Requested Prescriptions  Pending Prescriptions Disp Refills   acarbose  (PRECOSE ) 25 MG tablet [Pharmacy Med Name: Acarbose  25 MG Oral Tablet] 270 tablet 0    Sig: TAKE 1 TABLET BY MOUTH THREE TIMES DAILY WITH MEALS     Endocrinology:  Diabetes - Alpha Glucosidase Inhibitors - acarbose  Passed - 07/28/2024 10:07 AM      Passed - HBA1C is between 0 and 7.9 and within 180 days    Hemoglobin A1C  Date Value Ref Range Status  03/04/2024 6.5 (A) 4.0 - 5.6 % Final   Hgb A1c MFr Bld  Date Value Ref Range Status  12/04/2023 6.8 (H) 4.8 - 5.6 % Final    Comment:             Prediabetes: 5.7 - 6.4          Diabetes: >6.4          Glycemic control for adults with diabetes: <7.0          Passed - AST in normal range and within 360 days    AST  Date Value Ref Range Status  12/04/2023 18 0 - 40 IU/L Final         Passed - ALT in normal range and within 360 days    ALT  Date Value Ref Range Status  12/04/2023 15 0 - 32 IU/L Final         Passed - Cr in normal range and within 360 days    Creatinine, Ser  Date Value Ref Range Status  03/26/2024 0.71 0.44 - 1.00 mg/dL Final         Passed - Valid encounter within last 6 months    Recent Outpatient Visits           2 months ago Type 2 diabetes mellitus with diabetic polyneuropathy, without long-term current use of insulin (HCC)   [place] [facility] Simmons-Robinson, [place], MD   4 months ago Type 2 diabetes mellitus with diabetic polyneuropathy, without long-term current use of insulin (HCC)   [place] [facility] Simmons-Robinson, [place], MD   7 months ago Essential (primary) hypertension   [place] [facility] Simmons-Robinson, [place], MD   11 months ago Type 2 diabetes mellitus with diabetic polyneuropathy, without long-term current use of insulin (HCC)   [place] [facility] [place], [place], MD

## 2024-07-29 ENCOUNTER — Ambulatory Visit: Admitting: Podiatry

## 2024-08-05 ENCOUNTER — Ambulatory Visit: Admitting: Podiatry

## 2024-08-05 ENCOUNTER — Encounter: Payer: Self-pay | Admitting: Podiatry

## 2024-08-05 DIAGNOSIS — B351 Tinea unguium: Secondary | ICD-10-CM

## 2024-08-05 DIAGNOSIS — E1142 Type 2 diabetes mellitus with diabetic polyneuropathy: Secondary | ICD-10-CM

## 2024-08-05 DIAGNOSIS — D2371 Other benign neoplasm of skin of right lower limb, including hip: Secondary | ICD-10-CM

## 2024-08-05 DIAGNOSIS — D2372 Other benign neoplasm of skin of left lower limb, including hip: Secondary | ICD-10-CM

## 2024-08-06 ENCOUNTER — Encounter: Payer: Self-pay | Admitting: Podiatry

## 2024-08-06 NOTE — Progress Notes (Signed)
 She presents today for follow-up of painful elongated toenails and for diabetic check.  She states that I think things are doing good at this point.  Objective: Vital signs are stable alert and oriented x 3.  Pulses are palpable.  Ulcerative lesions only demonstrate reactive benign neoplasm or preulcerative thickening at this point.  Toenails are thick yellow dystrophic with mycotic no open lesions or wounds are noted at this time.  Assessment: Diabetes mellitus diabetic peripheral neuropathy pain in limb secondary to onychomycosis and benign skin lesions preulcerative.  Plan: Debridement of benign skin lesions bilateral.  Debridement of toenails 1 through 5 bilateral.  Follow-up with her in 3 months or sooner if skin breakdown occurs.  If she does develop skin breakdown and develops an ulceration she is to call and request Dr. Silva however otherwise I will do her routine care for her.

## 2024-08-12 ENCOUNTER — Ambulatory Visit: Admitting: Family Medicine

## 2024-11-04 ENCOUNTER — Ambulatory Visit: Admitting: Podiatry

## 2024-12-02 ENCOUNTER — Ambulatory Visit
